# Patient Record
Sex: Female | Born: 1955 | Race: Black or African American | Hispanic: No | Smoking: Former smoker
Health system: Southern US, Community
[De-identification: ages and names within clinical notes are randomized; demographics above are authoritative.]

## PROBLEM LIST (undated history)

## (undated) DIAGNOSIS — Z950 Presence of cardiac pacemaker: Secondary | ICD-10-CM

## (undated) DIAGNOSIS — K219 Gastro-esophageal reflux disease without esophagitis: Secondary | ICD-10-CM

## (undated) DIAGNOSIS — K760 Fatty (change of) liver, not elsewhere classified: Secondary | ICD-10-CM

## (undated) DIAGNOSIS — M719 Bursopathy, unspecified: Secondary | ICD-10-CM

## (undated) DIAGNOSIS — R001 Bradycardia, unspecified: Secondary | ICD-10-CM

## (undated) DIAGNOSIS — T8859XA Other complications of anesthesia, initial encounter: Secondary | ICD-10-CM

## (undated) DIAGNOSIS — E119 Type 2 diabetes mellitus without complications: Secondary | ICD-10-CM

## (undated) DIAGNOSIS — K602 Anal fissure, unspecified: Secondary | ICD-10-CM

## (undated) DIAGNOSIS — M797 Fibromyalgia: Secondary | ICD-10-CM

## (undated) DIAGNOSIS — T7840XA Allergy, unspecified, initial encounter: Secondary | ICD-10-CM

## (undated) DIAGNOSIS — J45909 Unspecified asthma, uncomplicated: Secondary | ICD-10-CM

## (undated) DIAGNOSIS — K589 Irritable bowel syndrome without diarrhea: Secondary | ICD-10-CM

## (undated) DIAGNOSIS — E785 Hyperlipidemia, unspecified: Secondary | ICD-10-CM

## (undated) DIAGNOSIS — F32A Depression, unspecified: Secondary | ICD-10-CM

## (undated) DIAGNOSIS — M1990 Unspecified osteoarthritis, unspecified site: Secondary | ICD-10-CM

## (undated) DIAGNOSIS — T4145XA Adverse effect of unspecified anesthetic, initial encounter: Secondary | ICD-10-CM

## (undated) DIAGNOSIS — G5752 Tarsal tunnel syndrome, left lower limb: Secondary | ICD-10-CM

## (undated) DIAGNOSIS — L409 Psoriasis, unspecified: Secondary | ICD-10-CM

## (undated) DIAGNOSIS — I10 Essential (primary) hypertension: Secondary | ICD-10-CM

## (undated) DIAGNOSIS — B159 Hepatitis A without hepatic coma: Secondary | ICD-10-CM

## (undated) DIAGNOSIS — F329 Major depressive disorder, single episode, unspecified: Secondary | ICD-10-CM

## (undated) DIAGNOSIS — F419 Anxiety disorder, unspecified: Secondary | ICD-10-CM

## (undated) DIAGNOSIS — K8020 Calculus of gallbladder without cholecystitis without obstruction: Secondary | ICD-10-CM

## (undated) HISTORY — DX: Type 2 diabetes mellitus without complications: E11.9

## (undated) HISTORY — DX: Calculus of gallbladder without cholecystitis without obstruction: K80.20

## (undated) HISTORY — PX: SMALL INTESTINE SURGERY: SHX150

## (undated) HISTORY — PX: APPENDECTOMY: SHX54

## (undated) HISTORY — DX: Allergy, unspecified, initial encounter: T78.40XA

---

## 1977-06-06 DIAGNOSIS — B159 Hepatitis A without hepatic coma: Secondary | ICD-10-CM

## 1977-06-06 HISTORY — DX: Hepatitis a without hepatic coma: B15.9

## 1994-09-05 HISTORY — PX: ABDOMINAL HYSTERECTOMY: SHX81

## 1997-06-06 HISTORY — PX: SHOULDER ARTHROSCOPY W/ ROTATOR CUFF REPAIR: SHX2400

## 1998-02-27 ENCOUNTER — Ambulatory Visit (HOSPITAL_BASED_OUTPATIENT_CLINIC_OR_DEPARTMENT_OTHER): Admission: RE | Admit: 1998-02-27 | Discharge: 1998-02-27 | Payer: Self-pay | Admitting: Orthopedic Surgery

## 1998-10-08 ENCOUNTER — Other Ambulatory Visit: Admission: RE | Admit: 1998-10-08 | Discharge: 1998-10-08 | Payer: Self-pay | Admitting: *Deleted

## 1999-07-01 ENCOUNTER — Encounter: Payer: Self-pay | Admitting: Orthopedic Surgery

## 1999-07-01 ENCOUNTER — Ambulatory Visit (HOSPITAL_COMMUNITY): Admission: RE | Admit: 1999-07-01 | Discharge: 1999-07-01 | Payer: Self-pay | Admitting: Orthopedic Surgery

## 1999-11-15 ENCOUNTER — Other Ambulatory Visit: Admission: RE | Admit: 1999-11-15 | Discharge: 1999-11-15 | Payer: Self-pay | Admitting: Obstetrics & Gynecology

## 2001-01-12 ENCOUNTER — Other Ambulatory Visit: Admission: RE | Admit: 2001-01-12 | Discharge: 2001-01-12 | Payer: Self-pay | Admitting: Obstetrics & Gynecology

## 2001-08-07 ENCOUNTER — Encounter: Payer: Self-pay | Admitting: Emergency Medicine

## 2001-08-07 ENCOUNTER — Emergency Department (HOSPITAL_COMMUNITY): Admission: EM | Admit: 2001-08-07 | Discharge: 2001-08-07 | Payer: Self-pay | Admitting: Emergency Medicine

## 2002-05-06 ENCOUNTER — Other Ambulatory Visit: Admission: RE | Admit: 2002-05-06 | Discharge: 2002-05-06 | Payer: Self-pay | Admitting: Obstetrics & Gynecology

## 2003-01-28 ENCOUNTER — Encounter: Admission: RE | Admit: 2003-01-28 | Discharge: 2003-01-28 | Payer: Self-pay | Admitting: Gastroenterology

## 2003-01-28 ENCOUNTER — Encounter: Payer: Self-pay | Admitting: Gastroenterology

## 2003-02-03 ENCOUNTER — Inpatient Hospital Stay (HOSPITAL_COMMUNITY): Admission: AD | Admit: 2003-02-03 | Discharge: 2003-02-06 | Payer: Self-pay | Admitting: Internal Medicine

## 2003-02-04 ENCOUNTER — Encounter (INDEPENDENT_AMBULATORY_CARE_PROVIDER_SITE_OTHER): Payer: Self-pay | Admitting: Specialist

## 2003-02-04 ENCOUNTER — Encounter: Payer: Self-pay | Admitting: Internal Medicine

## 2003-02-05 ENCOUNTER — Encounter: Payer: Self-pay | Admitting: Internal Medicine

## 2003-05-13 ENCOUNTER — Other Ambulatory Visit: Admission: RE | Admit: 2003-05-13 | Discharge: 2003-05-13 | Payer: Self-pay | Admitting: Obstetrics & Gynecology

## 2003-09-24 ENCOUNTER — Inpatient Hospital Stay (HOSPITAL_COMMUNITY): Admission: AD | Admit: 2003-09-24 | Discharge: 2003-09-26 | Payer: Self-pay | Admitting: Gastroenterology

## 2004-01-22 ENCOUNTER — Emergency Department (HOSPITAL_COMMUNITY): Admission: EM | Admit: 2004-01-22 | Discharge: 2004-01-22 | Payer: Self-pay | Admitting: *Deleted

## 2004-01-26 ENCOUNTER — Emergency Department (HOSPITAL_COMMUNITY): Admission: EM | Admit: 2004-01-26 | Discharge: 2004-01-26 | Payer: Self-pay | Admitting: Emergency Medicine

## 2004-12-22 ENCOUNTER — Ambulatory Visit: Payer: Self-pay | Admitting: Gastroenterology

## 2004-12-24 ENCOUNTER — Ambulatory Visit: Payer: Self-pay | Admitting: Gastroenterology

## 2004-12-24 ENCOUNTER — Ambulatory Visit (HOSPITAL_COMMUNITY): Admission: RE | Admit: 2004-12-24 | Discharge: 2004-12-24 | Payer: Self-pay | Admitting: Gastroenterology

## 2005-04-11 ENCOUNTER — Ambulatory Visit: Payer: Self-pay | Admitting: Gastroenterology

## 2005-04-22 ENCOUNTER — Ambulatory Visit: Payer: Self-pay | Admitting: Gastroenterology

## 2005-05-12 ENCOUNTER — Ambulatory Visit: Payer: Self-pay | Admitting: Gastroenterology

## 2005-05-12 ENCOUNTER — Encounter (INDEPENDENT_AMBULATORY_CARE_PROVIDER_SITE_OTHER): Payer: Self-pay | Admitting: *Deleted

## 2005-05-12 DIAGNOSIS — K644 Residual hemorrhoidal skin tags: Secondary | ICD-10-CM | POA: Insufficient documentation

## 2006-10-31 ENCOUNTER — Ambulatory Visit: Payer: Self-pay | Admitting: Gastroenterology

## 2006-11-16 ENCOUNTER — Encounter (INDEPENDENT_AMBULATORY_CARE_PROVIDER_SITE_OTHER): Payer: Self-pay | Admitting: Gastroenterology

## 2006-11-16 ENCOUNTER — Ambulatory Visit: Payer: Self-pay | Admitting: Gastroenterology

## 2006-11-16 DIAGNOSIS — K5730 Diverticulosis of large intestine without perforation or abscess without bleeding: Secondary | ICD-10-CM | POA: Insufficient documentation

## 2007-03-29 ENCOUNTER — Ambulatory Visit: Payer: Self-pay | Admitting: Gastroenterology

## 2007-03-29 LAB — CONVERTED CEMR LAB
Basophils Absolute: 0 10*3/uL (ref 0.0–0.1)
Basophils Relative: 0.3 % (ref 0.0–1.0)
Eosinophils Absolute: 0.1 10*3/uL (ref 0.0–0.6)
Eosinophils Relative: 1.4 % (ref 0.0–5.0)
HCT: 36.9 % (ref 36.0–46.0)
Lymphocytes Relative: 28.8 % (ref 12.0–46.0)
MCV: 83.4 fL (ref 78.0–100.0)
RBC: 4.42 M/uL (ref 3.87–5.11)
RDW: 12.8 % (ref 11.5–14.6)
Sed Rate: 26 mm/hr — ABNORMAL HIGH (ref 0–25)
WBC: 7.2 10*3/uL (ref 4.5–10.5)

## 2007-08-02 DIAGNOSIS — F331 Major depressive disorder, recurrent, moderate: Secondary | ICD-10-CM | POA: Insufficient documentation

## 2007-08-02 DIAGNOSIS — M810 Age-related osteoporosis without current pathological fracture: Secondary | ICD-10-CM | POA: Insufficient documentation

## 2007-08-02 DIAGNOSIS — F32A Depression, unspecified: Secondary | ICD-10-CM | POA: Insufficient documentation

## 2007-08-02 DIAGNOSIS — F329 Major depressive disorder, single episode, unspecified: Secondary | ICD-10-CM | POA: Insufficient documentation

## 2007-08-02 DIAGNOSIS — F419 Anxiety disorder, unspecified: Secondary | ICD-10-CM

## 2007-08-02 DIAGNOSIS — F3340 Major depressive disorder, recurrent, in remission, unspecified: Secondary | ICD-10-CM | POA: Insufficient documentation

## 2007-10-01 ENCOUNTER — Ambulatory Visit: Payer: Self-pay | Admitting: Professional

## 2007-10-06 ENCOUNTER — Observation Stay (HOSPITAL_COMMUNITY): Admission: EM | Admit: 2007-10-06 | Discharge: 2007-10-09 | Payer: Self-pay | Admitting: Emergency Medicine

## 2007-10-08 HISTORY — PX: CARDIAC CATHETERIZATION: SHX172

## 2007-10-15 ENCOUNTER — Ambulatory Visit: Payer: Self-pay | Admitting: Professional

## 2007-11-01 ENCOUNTER — Ambulatory Visit: Payer: Self-pay | Admitting: Professional

## 2007-11-15 ENCOUNTER — Ambulatory Visit: Payer: Self-pay | Admitting: Professional

## 2007-11-20 ENCOUNTER — Ambulatory Visit: Payer: Self-pay | Admitting: Gastroenterology

## 2007-11-20 DIAGNOSIS — R072 Precordial pain: Secondary | ICD-10-CM | POA: Insufficient documentation

## 2007-11-20 DIAGNOSIS — E739 Lactose intolerance, unspecified: Secondary | ICD-10-CM | POA: Insufficient documentation

## 2007-11-20 DIAGNOSIS — K5732 Diverticulitis of large intestine without perforation or abscess without bleeding: Secondary | ICD-10-CM | POA: Insufficient documentation

## 2007-11-20 LAB — CONVERTED CEMR LAB
AST: 37 units/L (ref 0–37)
Bilirubin, Direct: 0.1 mg/dL (ref 0.0–0.3)
Eosinophils Relative: 1.9 % (ref 0.0–5.0)
MCV: 83.6 fL (ref 78.0–100.0)
Monocytes Absolute: 0.6 10*3/uL (ref 0.1–1.0)
Monocytes Relative: 9.3 % (ref 3.0–12.0)
Neutro Abs: 3.3 10*3/uL (ref 1.4–7.7)
RDW: 12.6 % (ref 11.5–14.6)
Total CK: 139 units/L (ref 7–177)
WBC: 6.6 10*3/uL (ref 4.5–10.5)

## 2007-11-29 ENCOUNTER — Ambulatory Visit: Payer: Self-pay | Admitting: Professional

## 2007-12-12 ENCOUNTER — Encounter: Payer: Self-pay | Admitting: Gastroenterology

## 2007-12-12 ENCOUNTER — Ambulatory Visit: Payer: Self-pay | Admitting: Gastroenterology

## 2007-12-13 ENCOUNTER — Ambulatory Visit: Payer: Self-pay | Admitting: Professional

## 2007-12-14 ENCOUNTER — Ambulatory Visit (HOSPITAL_COMMUNITY): Admission: RE | Admit: 2007-12-14 | Discharge: 2007-12-14 | Payer: Self-pay | Admitting: Gastroenterology

## 2007-12-14 ENCOUNTER — Encounter: Payer: Self-pay | Admitting: Gastroenterology

## 2007-12-24 ENCOUNTER — Ambulatory Visit: Payer: Self-pay | Admitting: Professional

## 2008-01-04 ENCOUNTER — Telehealth: Payer: Self-pay | Admitting: Gastroenterology

## 2008-01-10 ENCOUNTER — Ambulatory Visit: Payer: Self-pay | Admitting: Professional

## 2008-01-24 ENCOUNTER — Ambulatory Visit: Payer: Self-pay | Admitting: Professional

## 2008-02-07 ENCOUNTER — Ambulatory Visit: Payer: Self-pay | Admitting: Professional

## 2008-02-28 ENCOUNTER — Ambulatory Visit: Payer: Self-pay | Admitting: Professional

## 2008-03-13 ENCOUNTER — Ambulatory Visit: Payer: Self-pay | Admitting: Professional

## 2008-03-23 ENCOUNTER — Emergency Department (HOSPITAL_COMMUNITY): Admission: EM | Admit: 2008-03-23 | Discharge: 2008-03-23 | Payer: Self-pay | Admitting: Emergency Medicine

## 2008-03-27 ENCOUNTER — Telehealth: Payer: Self-pay | Admitting: Gastroenterology

## 2008-03-27 ENCOUNTER — Ambulatory Visit: Payer: Self-pay | Admitting: Professional

## 2008-04-07 ENCOUNTER — Ambulatory Visit: Payer: Self-pay | Admitting: Professional

## 2008-04-21 ENCOUNTER — Ambulatory Visit: Payer: Self-pay | Admitting: Professional

## 2008-05-08 ENCOUNTER — Ambulatory Visit: Payer: Self-pay | Admitting: Professional

## 2008-06-12 ENCOUNTER — Ambulatory Visit: Payer: Self-pay | Admitting: Professional

## 2008-06-26 ENCOUNTER — Ambulatory Visit: Payer: Self-pay | Admitting: Professional

## 2008-07-14 ENCOUNTER — Ambulatory Visit: Payer: Self-pay | Admitting: Professional

## 2008-08-18 ENCOUNTER — Ambulatory Visit: Payer: Self-pay | Admitting: Professional

## 2008-09-04 ENCOUNTER — Ambulatory Visit: Payer: Self-pay | Admitting: Professional

## 2008-09-14 ENCOUNTER — Inpatient Hospital Stay (HOSPITAL_COMMUNITY): Admission: EM | Admit: 2008-09-14 | Discharge: 2008-09-20 | Payer: Self-pay | Admitting: Emergency Medicine

## 2008-09-15 ENCOUNTER — Ambulatory Visit: Payer: Self-pay | Admitting: Internal Medicine

## 2008-09-30 ENCOUNTER — Ambulatory Visit: Payer: Self-pay | Admitting: Gastroenterology

## 2008-10-01 ENCOUNTER — Telehealth: Payer: Self-pay | Admitting: Gastroenterology

## 2008-10-02 ENCOUNTER — Telehealth: Payer: Self-pay | Admitting: Gastroenterology

## 2008-10-11 ENCOUNTER — Emergency Department (HOSPITAL_COMMUNITY): Admission: EM | Admit: 2008-10-11 | Discharge: 2008-10-12 | Payer: Self-pay | Admitting: Emergency Medicine

## 2008-10-21 ENCOUNTER — Ambulatory Visit: Payer: Self-pay | Admitting: Gastroenterology

## 2008-10-21 DIAGNOSIS — M797 Fibromyalgia: Secondary | ICD-10-CM | POA: Insufficient documentation

## 2008-10-21 DIAGNOSIS — K5900 Constipation, unspecified: Secondary | ICD-10-CM | POA: Insufficient documentation

## 2008-10-30 ENCOUNTER — Ambulatory Visit: Payer: Self-pay | Admitting: Professional

## 2008-10-31 ENCOUNTER — Telehealth: Payer: Self-pay | Admitting: Gastroenterology

## 2008-11-13 ENCOUNTER — Ambulatory Visit: Payer: Self-pay | Admitting: Professional

## 2008-11-27 ENCOUNTER — Ambulatory Visit: Payer: Self-pay | Admitting: Professional

## 2008-12-04 ENCOUNTER — Telehealth: Payer: Self-pay | Admitting: Gastroenterology

## 2008-12-04 ENCOUNTER — Ambulatory Visit: Payer: Self-pay | Admitting: Internal Medicine

## 2008-12-04 DIAGNOSIS — Z860100 Personal history of colon polyps, unspecified: Secondary | ICD-10-CM | POA: Insufficient documentation

## 2008-12-04 DIAGNOSIS — K219 Gastro-esophageal reflux disease without esophagitis: Secondary | ICD-10-CM | POA: Insufficient documentation

## 2008-12-10 ENCOUNTER — Telehealth: Payer: Self-pay | Admitting: Physician Assistant

## 2008-12-11 ENCOUNTER — Ambulatory Visit: Payer: Self-pay | Admitting: Professional

## 2008-12-12 ENCOUNTER — Telehealth: Payer: Self-pay | Admitting: Physician Assistant

## 2008-12-25 ENCOUNTER — Ambulatory Visit: Payer: Self-pay | Admitting: Professional

## 2009-01-29 ENCOUNTER — Ambulatory Visit: Payer: Self-pay | Admitting: Professional

## 2009-03-05 ENCOUNTER — Ambulatory Visit: Payer: Self-pay | Admitting: Professional

## 2009-03-09 ENCOUNTER — Telehealth: Payer: Self-pay | Admitting: Physician Assistant

## 2009-03-10 ENCOUNTER — Encounter: Payer: Self-pay | Admitting: Physician Assistant

## 2009-03-10 ENCOUNTER — Telehealth: Payer: Self-pay | Admitting: Physician Assistant

## 2009-03-11 ENCOUNTER — Ambulatory Visit: Payer: Self-pay | Admitting: Gastroenterology

## 2009-03-11 ENCOUNTER — Encounter: Payer: Self-pay | Admitting: Physician Assistant

## 2009-03-12 ENCOUNTER — Ambulatory Visit: Payer: Self-pay | Admitting: Professional

## 2009-03-13 LAB — CONVERTED CEMR LAB
Eosinophils Relative: 1.5 % (ref 0.0–5.0)
HCT: 39.6 % (ref 36.0–46.0)
Lymphs Abs: 3.9 10*3/uL (ref 0.7–4.0)
Monocytes Relative: 5.6 % (ref 3.0–12.0)
Neutro Abs: 4.4 10*3/uL (ref 1.4–7.7)
Neutrophils Relative %: 48 % (ref 43.0–77.0)
Platelets: 407 10*3/uL — ABNORMAL HIGH (ref 150.0–400.0)
RBC: 4.72 M/uL (ref 3.87–5.11)

## 2009-04-16 ENCOUNTER — Ambulatory Visit: Payer: Self-pay | Admitting: Professional

## 2009-05-04 ENCOUNTER — Ambulatory Visit: Payer: Self-pay | Admitting: Professional

## 2009-05-25 ENCOUNTER — Ambulatory Visit: Payer: Self-pay | Admitting: Professional

## 2009-06-11 ENCOUNTER — Telehealth: Payer: Self-pay | Admitting: Gastroenterology

## 2009-06-12 ENCOUNTER — Ambulatory Visit: Payer: Self-pay | Admitting: Gastroenterology

## 2009-06-12 LAB — CONVERTED CEMR LAB
Basophils Absolute: 0.1 10*3/uL (ref 0.0–0.1)
Basophils Relative: 0.9 % (ref 0.0–3.0)
Hemoglobin: 13.4 g/dL (ref 12.0–15.0)
Lymphocytes Relative: 40.7 % (ref 12.0–46.0)
Lymphs Abs: 2.6 10*3/uL (ref 0.7–4.0)
Monocytes Absolute: 0.6 10*3/uL (ref 0.1–1.0)
Monocytes Relative: 9.4 % (ref 3.0–12.0)
Neutro Abs: 2.9 10*3/uL (ref 1.4–7.7)
Neutrophils Relative %: 47.2 % (ref 43.0–77.0)
Platelets: 361 10*3/uL (ref 150.0–400.0)

## 2009-07-09 ENCOUNTER — Ambulatory Visit: Payer: Self-pay | Admitting: Professional

## 2009-07-24 ENCOUNTER — Encounter: Admission: RE | Admit: 2009-07-24 | Discharge: 2009-07-24 | Payer: Self-pay | Admitting: Surgery

## 2009-08-04 ENCOUNTER — Inpatient Hospital Stay (HOSPITAL_COMMUNITY): Admission: RE | Admit: 2009-08-04 | Discharge: 2009-08-08 | Payer: Self-pay | Admitting: Surgery

## 2009-08-04 ENCOUNTER — Encounter (INDEPENDENT_AMBULATORY_CARE_PROVIDER_SITE_OTHER): Payer: Self-pay | Admitting: Surgery

## 2009-08-04 HISTORY — PX: LAPAROSCOPIC LYSIS INTESTINAL ADHESIONS: SUR778

## 2009-08-04 HISTORY — PX: CYSTOSCOPY W/ URETERAL STENT PLACEMENT: SHX1429

## 2009-08-26 ENCOUNTER — Encounter: Payer: Self-pay | Admitting: Gastroenterology

## 2009-09-04 ENCOUNTER — Telehealth (INDEPENDENT_AMBULATORY_CARE_PROVIDER_SITE_OTHER): Payer: Self-pay | Admitting: *Deleted

## 2009-09-10 ENCOUNTER — Ambulatory Visit: Payer: Self-pay | Admitting: Professional

## 2009-09-16 ENCOUNTER — Encounter: Admission: RE | Admit: 2009-09-16 | Discharge: 2009-09-16 | Payer: Self-pay | Admitting: General Surgery

## 2009-09-21 ENCOUNTER — Telehealth (INDEPENDENT_AMBULATORY_CARE_PROVIDER_SITE_OTHER): Payer: Self-pay | Admitting: *Deleted

## 2009-10-12 ENCOUNTER — Ambulatory Visit: Payer: Self-pay | Admitting: Professional

## 2009-11-05 ENCOUNTER — Ambulatory Visit: Payer: Self-pay | Admitting: Professional

## 2009-11-26 ENCOUNTER — Ambulatory Visit: Payer: Self-pay | Admitting: Professional

## 2009-12-17 ENCOUNTER — Ambulatory Visit: Payer: Self-pay | Admitting: Professional

## 2010-01-07 ENCOUNTER — Ambulatory Visit: Payer: Self-pay | Admitting: Professional

## 2010-02-18 ENCOUNTER — Ambulatory Visit: Payer: Self-pay | Admitting: Professional

## 2010-04-01 ENCOUNTER — Ambulatory Visit: Payer: Self-pay | Admitting: Professional

## 2010-04-08 ENCOUNTER — Telehealth (INDEPENDENT_AMBULATORY_CARE_PROVIDER_SITE_OTHER): Payer: Self-pay | Admitting: *Deleted

## 2010-04-22 ENCOUNTER — Ambulatory Visit: Payer: Self-pay | Admitting: Professional

## 2010-05-20 ENCOUNTER — Ambulatory Visit: Payer: Self-pay | Admitting: Professional

## 2010-06-24 ENCOUNTER — Ambulatory Visit: Admit: 2010-06-24 | Payer: Self-pay | Admitting: Professional

## 2010-07-06 NOTE — Letter (Signed)
Summary: Out of Work  [facility]  [address] [place], [place] [address]   Phone: [phone]  Fax: [phone]    June 12, 2009   Employee:  [name]    To Whom It May Concern:   For Medical reasons, please excuse the above named employee from work for the following dates:  Start:   06/12/09  End:   06/19/09  If you need additional information, please feel free to contact our office.         Sincerely,    Alberteen Spindle RN

## 2010-07-06 NOTE — Progress Notes (Signed)
Summary: triage / diverticulitis  Phone Note Call from Patient Call back at [phone]   Caller: Patient Call For: Sharlett Iles Reason for Call: Talk to Nurse Summary of Call: Diverticulities flare up, wants to be seen today has a lot of abd pain. Initial call taken by: Ronalee Red,  June 11, 2009 9:13 AM  Follow-up for Phone Call        Pt started Sunday with abd pain.  Gradually has gotten worse.  Today has to walk bent over..  Had chills last pm.  Pt asking for OV or Rx for antiobiotics and pain med.  (Symptoms same as in OCT when pt saw [name].)  No diarrhea or bleeding noted. Follow-up by: Donna Surface RN,  June 11, 2009 12:03 PM  Additional Follow-up for Phone Call Additional follow up Details #1::        Offered pt OV tomorrow am with Dr. Sarath Privott.  Pt asking if she can go ahead and be started on antioboitics and pain med today.  She has been treated with these before and has done well.  Will come  for OV but feels needs to start on treatment today.  Leaving work because feels so bad.   Additional Follow-up by: Donna Surface RN,  June 11, 2009 1:09 PM    Additional Follow-up for Phone Call Additional follow up Details #2::    yes. Start on ciprofloxacin 500 mg p.o. b.i.d. and metronidazole 500 mg p.o. b.i.d. Dispense a 10 day supply of each. Place her on clear liquid diet. Tylenol for pain. See Dr. Morad Tal in the morning as planned. Follow-up by: John N Perry MD,  June 11, 2009 1:16 PM  Additional Follow-up for Phone Call Additional follow up Details #3:: Details for Additional Follow-up Action Taken: Pt notified.  Appt scheduled. Additional Follow-up by: Donna Surface RN,  June 11, 2009 1:28 PM  Prescriptions: CIPRO 500 MG TABS (CIPROFLOXACIN HCL) Tale 1 tab twice daily x 10 days  #20 x 0   Entered by:   Donna Surface RN   Authorized by:   John N Perry MD   Signed by:   Donna Surface RN on 06/11/2009   Method used:   Electronically to   [id number]       [address]       [place]       [place], [place]  [address]       Ph: [phone] or [phone]       Fax: [phone]   RxID:   [id number] FLAGYL 500 MG TABS (METRONIDAZOLE) Take 1 tab two times a day x 10 days  #20 x 0   Entered by:   Donna Surface RN   Authorized by:   John N Perry MD   Signed by:   Donna Surface RN on 06/11/2009   Method used:   [facility]ically to        [id number]       [address]       [place], [place]  [address]       Ph: [phone] or [phone]       Fax: [phone]   RxID:   [id number]

## 2010-07-06 NOTE — Progress Notes (Signed)
Summary: CHANGE GI CARE  ---- Converted from flag ---- ---- 04/08/2010 3:22 PM, Valda Favia wrote: This patient is transferring her records to Dr. Lorane Gell office. ------------------------------

## 2010-07-06 NOTE — Progress Notes (Signed)
Summary: Records request from [place] for records received from [facility]. Request forwarded to [facility]. Dena Chavis  September 04, 2009 12:39 PM  Appended Document: Records request from [place] for records received from [facility]. Request forwarded to [facility].

## 2010-07-06 NOTE — Assessment & Plan Note (Signed)
Summary: Flare up diverticulitis/dfs   History of Present Illness Visit Type: Follow-up Visit Primary GI MD: Verl Blalock MD FACP [place] Primary Provider: Daphane Shepherd, PA-C Requesting Provider: na Chief Complaint: Diverticulitis flare up that started on Sunday. Pt states she has LLQ abd pan and soreness and a change in bowel habits. Pt denies fever or any other sx.  History of Present Illness:   This patient is a peptic American female who has chronic recurrent lower abdominal pain with documented diverticulitis on several episodes. She has had surgical referral and has refused sigmoid resection. She continues to come in every several months complaints of lower abdominal discomfort and seeks narcotics and prolonged excuses from work. She now relates she's had lower abdominal pain for 2-3 days without nausea and vomiting, fever, chills, or diarrhea. She has been started empirically on Cipro and Flagyl and continues to take Vicodin 5 days 500 mg for chronic pain syndrome. Also of note the patient is on Robinul Forte 2 mg a day, Skelaxin, kapidex 60 mg a day, amitriptyline 50 mg at bedtime, and p.r.n. hydrocortisone suppositories.   GI Review of Systems    Reports abdominal pain and  nausea.     Location of  Abdominal pain: LLQ.    Denies acid reflux, belching, bloating, chest pain, dysphagia with liquids, dysphagia with solids, heartburn, loss of appetite, vomiting, vomiting blood, weight loss, and  weight gain.      Reports change in bowel habits and  diverticulosis.     Denies anal fissure, black tarry stools, constipation, diarrhea, fecal incontinence, heme positive stool, hemorrhoids, irritable bowel syndrome, jaundice, light color stool, liver problems, rectal bleeding, and  rectal pain.    Current Medications (verified): 1)  Vitamin D (Ergocalciferol) 50000 Unit Caps (Ergocalciferol) .... Take One By Mouth Every Other Week 2)  Kapidex 60 Mg Cpdr (Dexlansoprazole) .... Take 1 Tablet By  Mouth Once A Day 3)  Tricor 145 Mg Tabs (Fenofibrate) .... Take 1 Tablet By Mouth Once A Day 4)  Hydrocortisone Acetate 25 Mg Supp (Hydrocortisone Acetate) .... Insert One Into Rectum As Needed 5)  Fosamax 70 Mg Tabs (Alendronate Sodium) .... Take One Tablet Every Other Week 6)  Robinul-Forte 2 Mg Tabs (Glycopyrrolate) .... Take 1 Tablet By Mouth Once Daily 7)  Skelaxin 800 Mg Tabs (Metaxalone) .... 1/2 - 1 Tablet By Mouth Every 8 Hours As Needed 8)  Vicodin 5-500 Mg Tabs (Hydrocodone-Acetaminophen) .... Take One Tablet By Mouth Every 6 Hours 9)  Ibuprofen 800 Mg Tabs (Ibuprofen) .... Take One By Mouth As Needed 10)  Amitriptyline Hcl 25 Mg Tabs (Amitriptyline Hcl) .... Take 1-2 Tabs By Mouth At Bedtime 11)  Cipro 500 Mg Tabs (Ciprofloxacin Hcl) .... Tale 1 Tab Twice Daily X 10 Days 12)  Flagyl 500 Mg Tabs (Metronidazole) .... Take 1 Tab Two Times A Day X 10 Days 13)  Vicodin 5-500 Mg Tabs (Hydrocodone-Acetaminophen) .... Take 1 Tab Every 4-6 Hours As Needed For Pain 14)  [facility] Soluble Fiber Therapy  Powd (Methylcellulose (Laxative)) .... One Scoop in 17 Oz of Water Once Daily  Allergies (verified): 1)  ! Sulfa 2)  ! Darvocet 3)  ! Codeine 4)  ! * Latex  Past History:  Past medical, surgical, family and social histories (including risk factors) reviewed for relevance to current acute and chronic problems.  Past Medical History: Reviewed history from 12/04/2008 and no changes required. 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 Appendectomy Hysterectomy C-Section Rotator Cuff Repair-Left  Family History: Reviewed history from 09/30/2008 and no changes required. Family History of Diabetes: mother sisters and brother Family History of Liver Cancer:mother Family History of Colon Cancer:3 1st cousins died Family History of Heart Disease: 1/2 brother Family History of Breast Cancer: Grandmother, Aunt Family History of Pancreatic Cancer: Cousin  Social History: Reviewed history from 09/30/2008 and no changes required. Patient currently smokes. -4 cigarrettes per day Alcohol Use - no Daily Caffeine Use-1 cup Occupation: Education officer, museum Illicit Drug Use - no Patient does not get regular exercise.   Review of Systems       The patient complains of abdominal pain.  The patient denies anorexia, fever, weight loss, weight gain, vision loss, decreased hearing, hoarseness, chest pain, syncope, dyspnea on exertion, peripheral edema, prolonged cough, headaches, hemoptysis, melena, hematochezia, severe indigestion/heartburn, hematuria, incontinence, genital sores, muscle weakness, suspicious skin lesions, transient blindness, difficulty walking, depression, unusual weight change, abnormal bleeding, enlarged lymph nodes, angioedema, breast masses, and testicular masses.    Vital Signs:  Patient profile:   55 year old female Height:      67 inches Weight:      161.50 pounds BMI:     25.39 Temp:     98.2 degrees F oral Pulse rate:   88 / minute Pulse rhythm:   regular BP sitting:   118 / 72  (right arm) Cuff size:   regular  Vitals Entered By: Marlon Pel CMA Deborra Medina) (June 12, 2009 9:04 AM)  Physical Exam  General:  Well developed, well nourished, no acute distress.healthy appearing.   Head:  Normocephalic and atraumatic. Eyes:  PERRLA, no icterus. Lungs:  Clear throughout to auscultation. Heart:  Regular rate and rhythm; no murmurs, rubs,  or bruits. Abdomen:  Her abdomen is not distended and  there is minimal tenderness to deep palpation left lower quadrant without any masses. There is a negative psoas and obturator signs. Bowel sounds are normal Extremities:  No clubbing, cyanosis, edema or deformities noted. Neurologic:  Alert and  oriented x4;  grossly normal neurologically. Cervical Nodes:  No significant cervical adenopathy. Inguinal Nodes:  No significant inguinal adenopathy. Psych:  Alert and cooperative. Normal mood and affect.   Impression & Recommendations:  Problem # 1:  ABDOMINAL PAIN, LEFT LOWER QUADRANT (ICD-789.04) Assessment Unchanged Clinically she possibly has very mild subacute diverticulitis. It is obvious on speaking to the patient that she is seeking narcotics and prolonged absence from work. I have given her a one-week from work excuse pending repeat surgical evaluation. She was hospitalized in April of this past year had documented diverticulitis. She needs sigmoid resection before complications arise from her diverticulitis. Her management is obviously compromised by her environmental situation and chronic pain syndrome. She needs close followup with primary care and perhaps preoperative psychiatric evaluation.For Now, we will complete 10 days of Cipro, metronidazole, and twice a day Robinul Forte. I have not given her other prescriptions for narcotics. Screening lab test and ordered. Orders: TLB-CRP-High Sensitivity (C-Reactive Protein) (86140-FCRP) TLB-CBC Platelet - w/Differential (85025-CBCD) TLB-Sedimentation Rate (ESR) (85652-ESR)  Problem # 2:  GERD (ICD-530.81) Assessment: Improved continue PPI therapy as tolerated.  Problem # 3:  PERSONAL HX COLONIC POLYPS (ICD-V12.72) Assessment: Unchanged she is up-to-date on her colonoscopy exams.  Problem #  4:  FIBROMYALGIA (ICD-729.1) Assessment: Comment Only  Patient Instructions: 1)  Copy sent to : Dr. Harlan Stains [facility]  2)  Please continue current medications.  3)  Work  excuse for one week 4)  .No narcotic prescription given 5)  Surgical referral followup. 6)  Diet should be high in fiber ( fruits, vegetables, whole grains) but low in residue. Drink at least eight (8) glasses of water a day.   Appended Document: Flare up diverticulitis/dfs    Clinical Lists Changes  Orders: Added new Test order of [facility] ([facility]) - Signed

## 2010-07-06 NOTE — Letter (Signed)
Summary: [facility]   Imported By: Phillis Knack 09/23/2009 07:36:23  _____________________________________________________________________  External Attachment:    Type:   Image     Comment:   External Document

## 2010-07-06 NOTE — Progress Notes (Signed)
  Phone Note Other Incoming   Request: Send information Summary of Call: Received a completed [facility] medical release from the patient. She is requesting for her records to be sent to Dr. Lorane Gell office. faxed 34 pages to [phone].

## 2010-07-06 NOTE — Progress Notes (Signed)
Summary: Records request from [place] for records received from [facility]. Request forwarded to [facility]. Valda Favia  September 21, 2009 12:23 PM

## 2010-07-19 ENCOUNTER — Ambulatory Visit: Payer: Self-pay | Admitting: Professional

## 2010-07-19 ENCOUNTER — Ambulatory Visit (INDEPENDENT_AMBULATORY_CARE_PROVIDER_SITE_OTHER): Payer: 59 | Admitting: Professional

## 2010-07-19 DIAGNOSIS — F331 Major depressive disorder, recurrent, moderate: Secondary | ICD-10-CM

## 2010-08-02 ENCOUNTER — Ambulatory Visit (INDEPENDENT_AMBULATORY_CARE_PROVIDER_SITE_OTHER): Payer: 59 | Admitting: Professional

## 2010-08-02 DIAGNOSIS — F331 Major depressive disorder, recurrent, moderate: Secondary | ICD-10-CM

## 2010-08-23 ENCOUNTER — Ambulatory Visit (INDEPENDENT_AMBULATORY_CARE_PROVIDER_SITE_OTHER): Payer: 59 | Admitting: Professional

## 2010-08-23 DIAGNOSIS — F331 Major depressive disorder, recurrent, moderate: Secondary | ICD-10-CM

## 2010-08-27 LAB — COMPREHENSIVE METABOLIC PANEL
AST: 27 U/L (ref 0–37)
Albumin: 4.3 g/dL (ref 3.5–5.2)
BUN: 8 mg/dL (ref 6–23)
Calcium: 9.4 mg/dL (ref 8.4–10.5)
Glucose, Bld: 86 mg/dL (ref 70–99)
Potassium: 4.1 mEq/L (ref 3.5–5.1)
Sodium: 142 mEq/L (ref 135–145)
Total Protein: 7.7 g/dL (ref 6.0–8.3)

## 2010-08-27 LAB — CBC: Platelets: 313 10*3/uL (ref 150–400)

## 2010-08-30 LAB — COMPREHENSIVE METABOLIC PANEL
ALT: 37 U/L — ABNORMAL HIGH (ref 0–35)
Albumin: 3.6 g/dL (ref 3.5–5.2)
Alkaline Phosphatase: 41 U/L (ref 39–117)
BUN: 5 mg/dL — ABNORMAL LOW (ref 6–23)
Chloride: 107 mEq/L (ref 96–112)
Glucose, Bld: 141 mg/dL — ABNORMAL HIGH (ref 70–99)
Potassium: 3.5 mEq/L (ref 3.5–5.1)
Sodium: 143 mEq/L (ref 135–145)
Total Bilirubin: 0.4 mg/dL (ref 0.3–1.2)
Total Protein: 6.7 g/dL (ref 6.0–8.3)

## 2010-08-30 LAB — CBC
HCT: 34.6 % — ABNORMAL LOW (ref 36.0–46.0)
Hemoglobin: 11.3 g/dL — ABNORMAL LOW (ref 12.0–15.0)
Hemoglobin: 11.5 g/dL — ABNORMAL LOW (ref 12.0–15.0)
MCHC: 33.3 g/dL (ref 30.0–36.0)
Platelets: 290 10*3/uL (ref 150–400)
Platelets: 294 10*3/uL (ref 150–400)
RBC: 3.96 MIL/uL (ref 3.87–5.11)
RDW: 12.6 % (ref 11.5–15.5)
RDW: 12.6 % (ref 11.5–15.5)
WBC: 10 10*3/uL (ref 4.0–10.5)
WBC: 12.6 10*3/uL — ABNORMAL HIGH (ref 4.0–10.5)

## 2010-08-30 LAB — DIFFERENTIAL
Basophils Absolute: 0.1 10*3/uL (ref 0.0–0.1)
Basophils Relative: 0 % (ref 0–1)
Eosinophils Absolute: 0 10*3/uL (ref 0.0–0.7)
Monocytes Absolute: 0.7 10*3/uL (ref 0.1–1.0)
Monocytes Relative: 6 % (ref 3–12)
Neutro Abs: 7.3 10*3/uL (ref 1.7–7.7)
Neutrophils Relative %: 58 % (ref 43–77)

## 2010-08-30 LAB — GLUCOSE, CAPILLARY: Glucose-Capillary: 135 mg/dL — ABNORMAL HIGH (ref 70–99)

## 2010-09-14 LAB — URINALYSIS, ROUTINE W REFLEX MICROSCOPIC
Glucose, UA: NEGATIVE mg/dL
Ketones, ur: NEGATIVE mg/dL
Nitrite: NEGATIVE
Specific Gravity, Urine: 1.016 (ref 1.005–1.030)
pH: 5.5 (ref 5.0–8.0)

## 2010-09-14 LAB — COMPREHENSIVE METABOLIC PANEL
AST: 54 U/L — ABNORMAL HIGH (ref 0–37)
Albumin: 3.9 g/dL (ref 3.5–5.2)
Alkaline Phosphatase: 46 U/L (ref 39–117)
BUN: 7 mg/dL (ref 6–23)
Creatinine, Ser: 0.55 mg/dL (ref 0.4–1.2)
GFR calc Af Amer: >60 mL/min (ref >60)
Potassium: 3.7 mEq/L (ref 3.5–5.1)
Total Protein: 6.9 g/dL (ref 6.0–8.3)

## 2010-09-14 LAB — POCT I-STAT, CHEM 8
Calcium, Ion: 1.19 mmol/L (ref 1.12–1.32)
Chloride: 102 mEq/L (ref 96–112)
Glucose, Bld: 147 mg/dL — ABNORMAL HIGH (ref 70–99)
HCT: 39 % (ref 36.0–46.0)
TCO2: 31 mmol/L (ref 0–100)

## 2010-09-14 LAB — CBC
HCT: 37.6 % (ref 36.0–46.0)
Platelets: 294 10*3/uL (ref 150–400)
RDW: 13 % (ref 11.5–15.5)

## 2010-09-14 LAB — URINE MICROSCOPIC-ADD ON

## 2010-09-14 LAB — DIFFERENTIAL
Lymphocytes Relative: 23 % (ref 12–46)
Monocytes Absolute: 0.5 10*3/uL (ref 0.1–1.0)
Monocytes Relative: 5 % (ref 3–12)
Neutro Abs: 7.9 10*3/uL — ABNORMAL HIGH (ref 1.7–7.7)

## 2010-09-15 LAB — BASIC METABOLIC PANEL
BUN: 4 mg/dL — ABNORMAL LOW (ref 6–23)
CO2: 28 mEq/L (ref 19–32)
CO2: 27 mEq/L (ref 19–32)
CO2: 28 mEq/L (ref 19–32)
Calcium: 9.4 mg/dL (ref 8.4–10.5)
Chloride: 110 mEq/L (ref 96–112)
Chloride: 108 mEq/L (ref 96–112)
Chloride: 111 mEq/L (ref 96–112)
Creatinine, Ser: 0.67 mg/dL (ref 0.4–1.2)
Creatinine, Ser: 0.69 mg/dL (ref 0.4–1.2)
GFR calc Af Amer: >60 mL/min (ref >60)
GFR calc non Af Amer: >60 mL/min (ref >60)
Glucose, Bld: 92 mg/dL (ref 70–99)
Glucose, Bld: 100 mg/dL — ABNORMAL HIGH (ref 70–99)
Glucose, Bld: 95 mg/dL (ref 70–99)
Potassium: 4.1 mEq/L (ref 3.5–5.1)
Potassium: 4 mEq/L (ref 3.5–5.1)
Sodium: 143 mEq/L (ref 135–145)
Sodium: 141 mEq/L (ref 135–145)

## 2010-09-15 LAB — CBC
HCT: 34 % — ABNORMAL LOW (ref 36.0–46.0)
HCT: 33.6 % — ABNORMAL LOW (ref 36.0–46.0)
Hemoglobin: 11.5 g/dL — ABNORMAL LOW (ref 12.0–15.0)
Hemoglobin: 11.7 g/dL — ABNORMAL LOW (ref 12.0–15.0)
Hemoglobin: 11.3 g/dL — ABNORMAL LOW (ref 12.0–15.0)
Hemoglobin: 11.4 g/dL — ABNORMAL LOW (ref 12.0–15.0)
MCHC: 33.3 g/dL (ref 30.0–36.0)
MCHC: 33.7 g/dL (ref 30.0–36.0)
MCHC: 33.2 g/dL (ref 30.0–36.0)
MCHC: 33.6 g/dL (ref 30.0–36.0)
MCV: 83.5 fL (ref 78.0–100.0)
MCV: 83.5 fL (ref 78.0–100.0)
MCV: 84.2 fL (ref 78.0–100.0)
Platelets: 278 10*3/uL (ref 150–400)
Platelets: 284 10*3/uL (ref 150–400)
Platelets: 321 10*3/uL (ref 150–400)
RBC: 3.97 MIL/uL (ref 3.87–5.11)
RBC: 4.26 MIL/uL (ref 3.87–5.11)
RBC: 4.96 MIL/uL (ref 3.87–5.11)
RDW: 12.6 % (ref 11.5–15.5)
RDW: 12.7 % (ref 11.5–15.5)
RDW: 12.9 % (ref 11.5–15.5)
RDW: 12.9 % (ref 11.5–15.5)
RDW: 13.4 % (ref 11.5–15.5)
WBC: 5.5 10*3/uL (ref 4.0–10.5)
WBC: 6.1 10*3/uL (ref 4.0–10.5)
WBC: 6.6 10*3/uL (ref 4.0–10.5)

## 2010-09-15 LAB — COMPREHENSIVE METABOLIC PANEL
ALT: 52 U/L — ABNORMAL HIGH (ref 0–35)
AST: 30 U/L (ref 0–37)
Albumin: 3.5 g/dL (ref 3.5–5.2)
CO2: 26 mEq/L (ref 19–32)
Chloride: 108 mEq/L (ref 96–112)
GFR calc Af Amer: >60 mL/min (ref >60)
GFR calc non Af Amer: >60 mL/min (ref >60)
Potassium: 4.1 mEq/L (ref 3.5–5.1)
Sodium: 137 mEq/L (ref 135–145)
Total Bilirubin: 0.5 mg/dL (ref 0.3–1.2)

## 2010-09-15 LAB — URINALYSIS, ROUTINE W REFLEX MICROSCOPIC
Hgb urine dipstick: NEGATIVE
Protein, ur: NEGATIVE mg/dL
Urobilinogen, UA: 0.2 mg/dL (ref 0.0–1.0)

## 2010-09-15 LAB — DIFFERENTIAL
Basophils Absolute: 0 10*3/uL (ref 0.0–0.1)
Basophils Relative: 1 % (ref 0–1)
Monocytes Relative: 8 % (ref 3–12)
Neutro Abs: 3.4 10*3/uL (ref 1.7–7.7)
Neutrophils Relative %: 53 % (ref 43–77)

## 2010-09-15 LAB — PREGNANCY, URINE: Preg Test, Ur: NEGATIVE

## 2010-09-15 LAB — HEMOGLOBIN AND HEMATOCRIT, BLOOD: Hemoglobin: 11.1 g/dL — ABNORMAL LOW (ref 12.0–15.0)

## 2010-09-23 ENCOUNTER — Ambulatory Visit: Payer: 59 | Admitting: Professional

## 2010-10-14 ENCOUNTER — Ambulatory Visit (INDEPENDENT_AMBULATORY_CARE_PROVIDER_SITE_OTHER): Payer: 59 | Admitting: Professional

## 2010-10-14 DIAGNOSIS — F331 Major depressive disorder, recurrent, moderate: Secondary | ICD-10-CM

## 2010-10-19 ENCOUNTER — Other Ambulatory Visit: Payer: Self-pay | Admitting: Obstetrics & Gynecology

## 2010-10-19 NOTE — Consult Note (Signed)
[name], [name]                   ACCOUNT NO.:  [id number]   MEDICAL RECORD NO.:  [id number]          PATIENT TYPE:  INP   LOCATION:  5643                         FACILITY:  [facility]   PHYSICIAN:  Gatha Mayer, MD,FACGDATE OF BIRTH:  01-01-1956   DATE OF CONSULTATION:  09/15/2008  DATE OF DISCHARGE:                                 CONSULTATION   REQUESTING PHYSICIAN:  Vernell Leep, MD of [facility].   PRIMARY CARE PHYSICIAN:  [facility].   REASON FOR CONSULTATION:  Diverticulitis.   ASSESSMENT:  A 55 year old African American woman known to Dr. Verl Blalock and previously Dr. Lyla Son, followed for recurrent  diverticulitis and diverticulosis, history of colon polyps and family  history of colon cancer as well.   At this point she appears to have uncomplicated but recurrent  diverticulitis.  CT scan suggested an abnormality of the rectum.  She  had a colonoscopy in 2008 where there was no such finding and rectal  exam per the nurse practitioner does not reveal any problem, so that is  probably artifactual.  It may be some edema related to the sigmoid  diverticulitis.   RECOMMENDATIONS/PLAN:  1. Continue intravenous antibiotics.  2. Surgical consultation is appropriate.  Note, she had had an      increase in her pain that was recently improved by draining of her      bladder probably related to narcotic bladder issues.  However, she      has had recurrent diverticulitis over the years  so consideration      for elective segmental resection is appropriate and this is      discussed with the patient.  3. Last colonoscopy June 2008.  She said she has been told to have      these every 2 years.  There is a family history of colon cancer      reported.  She may end up needing a sigmoidoscopy exam versus a      colonoscopy though would do that electively.  The colon cancer is      in a cousin and not a first degree relative, so it is not  clear to      me she needs a colonoscopy now though we would have to see the full      details of that report prior to making a determination about the      next interval.  Dr. Sharlett Iles had assumed her regular      gastrointestinal care.  Would defer to him.   HISTORY:  55 African American woman with problems as  outlined above.  She called the on-call GI physician this weekend asking  for pain medication and antibiotics, was told to come to the ER but she  really did not want to do so.  Antibiotics were prescribed.  The pain  worsened so she presented to the emergency room.  Her last bowel  movement was normal on Saturday.  She has had subjective chills and  fever.  Very similar to the left lower quadrant  pain of previous  episodes of diverticulitis.  She said her hemorrhoids had flared up a  little bit lately as well.  She has had some increase in heartburn  recently.  Normally takes Kapidex daily for that as well as Robinul for  IBS problems.  She had had increase in pain after the hospitalization  here today, which again was relieved by insertion of a catheter to drain  her bladder.  She was having increasing pain medication requirements,  having switched from Dilaudid to morphine, but is definitely comfortable  at this time as I interview and examine her.   PAST MEDICAL HISTORY:  1. Gastroesophageal reflux disease.  2. Irritable bowel syndrome.  3. Diverticulosis.  4. Diverticulitis, multiple episodes.  She said this is the first      since 2006.  She may have had some in 2009 versus irritable bowel      syndrome.  5. Prior appendectomy.  6. Prior hysterectomy.  7. Anxiety.  8. Depression.  9. Chest pain admission.  10.Noncardiac chest pain in May of 2009 seen by Dr. Doylene Canard.  11.Cardiac catheterization, normal coronary arteries.   MEDICATIONS:  Fosamax, Lovenox, Tri-Cor, Mucinex, Protonix, Zosyn,  Ventolin, Dulcolax, Dilaudid and Zofran.   ALLERGIES:   SULFA, DARVOCET, CODEINE, LATEX.   FAMILY HISTORY:  Colon cancer in a first cousin.  Diabetes mellitus.  Liver cancer in her mother.   REVIEW OF SYSTEMS:  As per the HPI.  All other systems negative.   SOCIAL HISTORY:  She does not use alcohol.  She does smoke.   PHYSICAL EXAMINATION:  Reveals a well-developed, well-nourished middle-  aged black woman in no acute distress.  Temperature 98.3, pulse 64,  blood pressure 115/75, respirations 16.  EYES:  Anicteric.  Conjunctivae pink.  Mouth:  Free of lesions.  NECK:  Supple, no masses.  CHEST:  Some crackles at the right lower lobe.  Otherwise clear.  HEART:  S1, S2.  No murmurs or gallops.  ABDOMEN:  Soft, moderately tender in the left lower quadrant without  organomegaly or mass or rebound.  Bowel sounds are present.  RECTAL EXAM:  Per my nurse practitioner, no stool in the vault.  No  obvious masses.  No external hemorrhoids or fissures.  It was somewhat  tender and painful.  EXTREMITIES:  Free of edema.  No cervical adenopathy.  NEURO:  She is alert and oriented x3.   LABORATORY DATA:  Shows white count 6.6, hemoglobin 11.9, platelets 291.  Her HCG was negative.  ALT was 52.  LFTs, BMET otherwise normal.  CT  showed mild diffuse fatty liver, diverticular changes in the  rectosigmoid colon with focal inflammatory changes at the junction of  descending and sigmoid colon and a left perirectal lymph node 2.1 x 1.7  cm and asymmetric soft tissue changes in that area.   I appreciate the opportunity to care for this patient.      Gatha Mayer, MD,FACG  Electronically Signed     CEG/MEDQ  D:  09/15/2008  T:  09/15/2008  Job:  [id number]   cc:   Loralee Pacas. Sharlett Iles, MD, FACG, FACP, FAGA  [address]  [place] [address]   [facility]  [address]  [place], [place] [address]

## 2010-10-19 NOTE — Discharge Summary (Signed)
[name], [name]                   ACCOUNT NO.:  [id number]   MEDICAL RECORD NO.:  [id number]          PATIENT TYPE:  OBV   LOCATION:  5409                         FACILITY:  [facility]   PHYSICIAN:  Birdie Riddle, M.D.  DATE OF BIRTH:  1955-09-18   DATE OF ADMISSION:  10/06/2007  DATE OF DISCHARGE:  10/09/2007                               DISCHARGE SUMMARY   REFERRED BY:  Wendie Agreste, M.D.   FINAL DIAGNOSES:  1. Noncardiac chest pain.  2. Anxiety.  3. Gastroesophageal reflux disease.  4. Depression.  5. Tobacco use disorder.   DISCHARGE MEDICATIONS:  1. Aspirin 81 mg 1 daily.  2. Amitriptyline 25 mg at bedtime.  3. Xanax 0.25 mg 1 as needed.  4. Actonel 5 mg once a week.  5. Vitamin D 1000 units once a week.  6. Robinul daily 1 mg.  7. Tylenol as needed and directed.  8. Crestor 10 mg 1 daily.  9. Protonix 40 mg 1 daily.   Follow up by Dr. Dixie Dials in 1 month.  The patient to call [phone]  for appointment and Dr. Dellis Filbert of [facility] in  4-5 days.   DISCHARGE DIET:  Low-sodium heart-healthy diet.   DISCHARGE ACTIVITY:  The patient is to increase activity slowly, and the  patient to stop any activity that causes chest pain, shortness of  breath, dizziness, sweating, or excessive weakness.   WOUND CARE INSTRUCTIONS:  The patient to notify right groin pain,  swelling, or discharge, and the patient to get additional lipid.  Liver  function tests in 8 weeks and additional testing for noncardiac chest  pain by primary care physician.   HISTORY:  This 55 year old female presented with sharp chest pain  radiating to the neck and left arm along with left hand numbness.  The  patient had similar episode 6 months ago, but she did not go to  emergency room.  Today, her electrocardiogram appears suspicious for new  anterior wall injury versus old myocardial infarction.   PHYSICAL EXAMINATION:  VITAL SIGNS:  Temperature 96.6, pulse 56,  respirations 25, blood pressure 147/80, height 5 feet 7 inches, weight  152 pounds, and oxygen saturation 100%.  GENERAL:  The patient is well-built and well-nourished black female in  no significant distress, appears somewhat anxious.  HEENT:  The patient is normocephalic, atraumatic, has black eyes.  Pupils are equal and reacting to light.  Conjunctivae pink.  Sclerae  nonicteric.  NECK:  No JVD.  LUNGS:  Clear bilaterally.  HEART:  Normal S1 and S2 without S3 or gallop.  ABDOMEN:  Soft.  EXTREMITIES:  No edema, cyanosis, or clubbing.  SKIN:  Warm and dry.  NEUROLOGIC:  The patient moves all four extremities.  Cranial nerves  grossly intact.   LABORATORY DATA:  Normal electrolytes, BUN, creatinine, and glucose.  Normal hemoglobin/hematocrit, WBC count, and platelet count.  INR 0.9,  CK-MB, and troponin I negative x3.  Thyroid stimulating hormone was  normal.  Cholesterol level was normal.  Triglyceride was elevated at  384.  LDL cholesterol  was down to 68 and HDL cholesterol was slightly  low at 31.   Cardiac catheterization showed normal coronaries.   EKG showed a sinus rhythm with ST elevations in V2 lead.   HOSPITAL COURSE:  The patient was admitted to telemetry bed, myocardial  infarction was ruled out.  Because of her typical chest pain, she  underwent a diagnostic cardiac catheterization that failed to show any  coronary artery disease, and her left ventricular systolic function was  normal.  Hence, the patient's medications were adjusted, and she was  advised to see primary care physician for additional noncardiac chest  pain workup. She was discharged home in satisfactory condition on Oct 09, 2007.      Birdie Riddle, M.D.  Electronically Signed     ASK/MEDQ  D:  10/24/2007  T:  10/25/2007  Job:  [id number]   cc:   Wendie Agreste, M.D.

## 2010-10-19 NOTE — H&P (Signed)
[name], [name]                   ACCOUNT NO.:  [id number]   MEDICAL RECORD NO.:  [id number]          PATIENT TYPE:  INP   LOCATION:  0102                         FACILITY:  [facility]   PHYSICIAN:  Dena Billet, MD     DATE OF BIRTH:  10-Apr-1956   DATE OF ADMISSION:  09/14/2008  DATE OF DISCHARGE:                              HISTORY & PHYSICAL   PRIMARY CARE PHYSICIAN:  Dr. Philip Aspen.   CHIEF COMPLAINT:  Abdominal pain.   HISTORY OF PRESENT ILLNESS:  This is a 55 year old African American  female patient with a past medical history significant for  diverticulosis and gastritis as well as irritable bowel syndrome who was  apparently asymptomatic 5 days ago when she started having some lower  quadrant abdominal pain.  The pain was mild in nature until 2 days ago  when it had gotten worse when patient had called Dr. Velora Heckler GI when she  was prescribed Cipro and Flagyl.  The patient still had significant  pain, was not relieved by her Vicodin.  The patient was not able to eat  anything due to the pain and was taking some Jello at home.  Today, the  patient's abdominal pain had gotten worse, it was 10 out of 10 on a  scale of 1-10, when 10 is the worst pain, along with nausea though no  diarrhea or vomiting was noted.   REVIEW OF SYSTEMS:  As above.  Rest of the review of systems were  negative.   PAST MEDICAL HISTORY:  1. Diverticulosis.  2. Coronary artery disease status post MI last May, status post      cardiac cath.  3. GERD.  4. Irritable bowel syndrome.  5. Diverticulosis.  6. Anxiety.  7. Gastritis.  8. Osteoporosis.  9. Fibromyalgia.   PAST SURGICAL HISTORY:  1. Left shoulder rotator cuff repair.  2. Partial hysterectomy.  3. Cardiac cath May 2009, no stent was put in.   FAMILY HISTORY:  Positive for coronary artery disease and diabetes.   SOCIAL HISTORY:  The patient smokes 5 to 6 cigarettes per day for 33  years.  No alcohol, no IV drug abuse.   MEDICATIONS:  1. Kapidex 60 daily.  2. Robinul 2 mg daily.  3. Tricor 145 daily.  4. Vicodin p.r.n.  5. Vitamin D 1 tablet every other week.  6. Flagyl 250 p.o. 3 times daily.  7. Cipro 240 2 times daily.  8. Fosamax 70 weekly.   ALLERGIES:  1. CODEINE.  2. LATEX.  3. SULFA.   PHYSICAL EXAM:  VITALS:  Temperature is 97.4, blood pressure 140-  150/80s, pulse 50s to 70s, respirations 20s, pulse ox 98-100% on room  air.  Patient awake, alert, oriented x3.  Does not appear to be in acute  distress.  HEENT:  Pupils equal, round, react to light, no icterus, no pallor.  Extraocular movements are intact, oral mucosa is dry.  NECK:  Supple, no JVD, no lymphadenopathy, no thyromegaly.  CVS:  S1-S2 regular to sinus brady.  CHEST:  Clear.  ABDOMEN:  Soft.  There is  a tenderness on the superficial palpation of  the left lower quadrant.  No rebound.  Bowel sounds present, no  hepatosplenomegaly.  EXTREMITIES:  Peripheral pulses are present.  No clubbing, cyanosis or  edema.  CNS:  Sensory, motor grossly intact.  Cranial nerves II-XII are intact.  SKIN:  No rashes.  MUSCULOSKELETAL:  Unremarkable.   A CT scan of the abdomen and pelvis reveals sigmoid diverticulitis at  the junction of descending and sigmoid colon and asymmetric soft tissue  in the left side of the rectum, in the right a perirectal lymph node  concerning for neoplasm.  CBC was within normal range along with the  basic metabolic panel as well as the UA.   IMPRESSION:  1. Acute diverticulitis.  The patient failed outpatient oral treatment      management plan.  2. Abdominal pain.  3. Coronary artery disease status post cardiac catheterization.  The      patient is not taking her aspirin.  4. Gastroesophageal reflux disease.  5. Dehydration,  6. Rectal soft tissue swelling with lymphadenopathy, concern for      neoplasm.  7. Hyperlipidemia.  8. Irritable bowel syndrome.   PLAN:  1. Admit to Med-Surg.  2. Clear liquid diet for  now, advance as tolerated.  3. Continue IV fluids.  4. IV Zosyn 4.5 IV every 8.  5. Consult GI, has been notified.  Dr. Lajoyce Corners has been notified from the      ER.      Dena Billet, MD  Electronically Signed     NS/MEDQ  D:  09/14/2008  T:  09/14/2008  Job:  [id number]   cc:   Ermalene Searing. Philip Aspen, M.D.  Fax: [phone]

## 2010-10-19 NOTE — Assessment & Plan Note (Signed)
[facility] OFFICE NOTE   NAME:LEANetra, Postlethwait                        MRN:          [id number]  DATE:03/29/2007                            DOB:          [birth year]    Ms. Maiers is a middle-aged [place] American female who recently had left  lower quadrant pain.  Her triage call was taken by myself and we decided  to treat her for diverticulitis per her past medical history.  She  currently is asymptomatic, after completing a 1-week course of  metronidazole and ciprofloxacin.  This patient has been followed over  the many years by Dr. Velora Heckler and she was hospitalized in August 2004,  with rather severe diverticulitis.  Since that time she has had more or  less IBS type complaints and has had colon polyps removed with her last  colonoscopy exam in June 2008.  She currently is asymptomatic but does have some mild constipation and  does not take fiber supplements regularly.  She denies upper GI  hepatobiliary complaints.  She does use Robinul 2 mg every 12 hours  p.r.n.  She has mild osteoporosis and is on  vitamin D and Actonel.  She  also takes Xanax p.r.n. and diclofenac 75 mg twice a day.  She follows a  regular diet and denies any specific food intolerance.  Denies anorexia  or weight loss.   PHYSICAL EXAMINATION:  GENERAL:  She is a healthy-appearing, middle-  aged, black female in no distress.  VITAL SIGNS:  Her blood pressure is 112/80, pulse 84 and regular.  ABDOMEN:  I could not appreciate hepatosplenomegaly, abdominal masses or  significant tenderness at this time.  Bowel sounds were normal.   ASSESSMENT:  Ms. Heath has had subacute diverticulitis which seems to be  resolving at this time.   On review of her chart, she has not had frequent episodes and only 1  severe episode.  She is status post multiple surgical procedures  including appendectomy, hysterectomy, and cesarean section and Dr.  Velora Heckler  reports she has very long and tortuous and redundant colon.  I  have broached the subject of possible sigmoid resection depending on the  clinical course.  The patient seems very uninterested in this approach.   RECOMMENDATIONS:  1. Patient education regarding diverticulosis and its management.  2. High fiber diet with daily Benefiber and liberal p.o. fluids.  3. Finish antibiotic course.  4. Check CBC and sed rate.  5. GI followup p.r.n. as needed.     Loralee Pacas. Sharlett Iles, MD, [place] Ore, [place]  Electronically Signed    DRP/MedQ  DD: 03/29/2007  DT: 03/29/2007  Job #: [id number]

## 2010-10-19 NOTE — Discharge Summary (Signed)
[name], [name]                   ACCOUNT NO.:  [id number]   MEDICAL RECORD NO.:  [id number]          PATIENT TYPE:  INP   LOCATION:  3976                         FACILITY:  [facility]   PHYSICIAN:  Adele Barthel, MD    DATE OF BIRTH:  1956/05/04   DATE OF ADMISSION:  09/14/2008  DATE OF DISCHARGE:  09/20/2008                               DISCHARGE SUMMARY   GI SPECIALIST:  Dr. Leanna Battles from [facility].   PRIMARY CARE PHYSICIAN:  Dr. Sherald Barge at [facility].   ADMITTING HISTORY:  Please refer to the note dictated by Dr. Dena Billet for history of present illness.   DISCHARGE MEDICATIONS:  1. Ciprofloxacin 500 mg p.o. q.12 hours.  2. Flagyl 500 mg p.o. q.8 hours.  3. Percocet 5/325 mg p.o. q.6 hours p.r.n.  4. TriCor 145 mg p.o. daily.   PRIMARY DIAGNOSIS:  Acute diverticulitis.   SECONDARY DIAGNOSES:  1. Gastroesophageal reflux disease.  2. Coronary artery disease.  3. Irritable bowel syndrome.  4. Hyperlipidemia.   HOSPITAL COURSE:  The following issues were addressed during the  hospitalization:  1. Diverticulitis.  The patient failed outpatient oral antibiotic      regimen.  She underwent a CT scan at the time of admission which      showed sigmoid diverticulitis at junction of descending and sigmoid      colon and asymmetric soft tissue left side of the rectum and right      perirectal lymph node concerning neoplasm.  The patient received IV      Zosyn.  She had a very slow resolution of her symptoms.  So repeat      CT scan was performed on September 18, 2008, to make sure she was not      developing any intra-abdominal complications from the      diverticulitis.  However, it was consistent with mild      diverticulitis at the same site without any significant change      without any evidence of abscess or other complication.  GI consult      was obtained from [facility] on September 15, 2008, by Dr. Carlean Purl who      recommended IV  antibiotics.  At the time of discharge the patient      is significantly better, does have a bowel movement, is needing      minimal p.o. pain medication and has started tolerating regular      feeds.  She is going to follow up with Dr. Philip Aspen in his office      on April 27 at 1:45 p.m. for further followup and she may need a      flexible sigmoidoscopy as an outpatient.  2. History of hyperlipidemia.  Zocor was continued.  3. History of gastroesophageal reflux disease.  Proton pump inhibitor      was continued.   PROCEDURE PERFORMED:  None.   IMAGING PERFORMED:  As mentioned in the hospital consultation performed  as mentioned under hospital course.   DISPOSITION:  1. The patient will  follow up with Dr. Philip Aspen in his office on September 30, 2008.  2. The patient will follow up with primary care physician, Dr.      Gorden Harms at [facility].   Total time spent in discharge of this patient 1 hour.      Adele Barthel, MD  Electronically Signed     NP/MEDQ  D:  09/20/2008  T:  09/20/2008  Job:  [id number]   cc:   Ermalene Searing. Philip Aspen, M.D.  Fax: [phone]   Dr Francoise Schaumann

## 2010-10-19 NOTE — Consult Note (Signed)
[name], [name]                   ACCOUNT NO.:  [id number]   MEDICAL RECORD NO.:  [id number]          PATIENT TYPE:  INP   LOCATION:  [place]                         FACILITY:  [facility]   PHYSICIAN:  [place] A. Walker Kehr, M.D.    DATE OF BIRTH:  12-Aug-1955   DATE OF CONSULTATION:  10/06/2007  DATE OF DISCHARGE:                                 CONSULTATION   PRIMARY CARE Janet Decesare:  Dr. Nyoka Cowden.   CHIEF COMPLAINT:  Chest pain.   HISTORY OF PRESENT ILLNESS:  The patient is a 55 year old African  American female who awoke at sleep this morning at 4:30 with sudden  sharp chest pain that radiated to her neck and left arm that was located  centrally.  The patient felt short of breath with the chest pain, felt  nauseated, have left hand numbness.  The patient took 325 mg aspirin,  which relieved the pain partially but then called 911.  The patient  received nitroglycerin in the ambulance.  Once in the ED, she received  morphine which relieved the chest pain.  The patient does have a history  of cough, anxiety and stress, history of pleurisy in the past.  The  patient also has a history of 6 months of having chest pain that was  very severe, but never was seen the ED physicians.  The patient  currently has known hypercholesterolemia and is a smoker of half pack  per day for 33 years.  The patient currently, at the time of the  consult, is complaining of chest pain, once again reoccurring that was  relieved with morphine and noted to be sharp and centrally located  radiating to her neck.   PAST MEDICAL HISTORY:  1. Diverticulosis.  2. Depression.  3. Irritable bowel syndrome.  4. TMJ.  5. Hypercholesterolemia, no medications.  6. Anxiety.   REVIEW OF SYSTEMS:  In general, she denies any fevers or appetite  changes or weight changes.  She does state she has had chills, sweats,  and nausea.  HEENT:  No headache, no sore throat, no ear pain, no  rhinorrhea.  On cardiovascular review of systems, she  does have chest  pain.  She has had palpitations this morning, but no orthopnea or PND.  On respiratory review of systems, she has had cough and dyspnea, but no  wheezing, sputum, or hemoptysis.  On GI, she has had diarrhea yesterday  severe with abdominal pain and gas.  However, she denies any vomiting,  dysphagia, hematemesis, a bright red blood per rectum or melena.  On GU,  she denies any dysuria or hematuria or nocturia.  On skin, she has no  new rashes or bruises.  On musculoskeletal review, she has no  deformities, arthralgias, or swelling.  On neuro, she has had some  blurred vision today and some numbness that was partially relieved in  her left finger, but no dysarthria or weakness.   SOCIAL HISTORY:  She lives in [place].  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  Tobacco,  she smokes half pack per day for the last 33 years.  Alcohol, she drinks  3 6-packs a week of beer.  No current illicit drug use.   FAMILY HISTORY:  Her mother had a CVA and diabetes, liver cancer, and  hypertension.  Her father had diverticulosis and a CVA and he is  subsequently passed away. Her siblings, she has a half brother who has  had heart problems, requiring a defibrillator, but no history of an MI.   ALLERGIES:  She is allergic to CODEINE, LATEX, and SULFA.  No known food  allergies.   HOME MEDICATIONS:  1. Amitriptyline 25 mg 1-2 tablets p.o. nightly, this is recent.  2. Robinul 1 mg p.o. b.i.d.  3. Ibuprofen 600 mg as needed for pain.  4. Xanax 0.5 mg p.r.n. anxiety.  5. Cipro 500 mg as needed for diverticulitis flares, not currently      taking.  6. Actonel every Wednesdays for osteoporosis.  7. Vitamin D every Thursdays for vitamin D deficiency.   Also of note, she also sees Dr. Shirlee Limerick recently with [facility]  for counseling for her depression.   PHYSICAL EXAM:  VITAL SIGNS:  Temperature 96.6, pulse 66, respirations  25,  blood pressure 147/89, and pulse ox 100%.  GENERAL:  She is in mild distress.  She is currently with chest pain.  HEENT:  She is normocephalic, atraumatic.  Pupils equally, round and  reactive to light bilaterally.  Extraocular muscles were intact.  NECK:  Supple without JVD.  CARDIOVASCULAR:  She has regular rate and rhythm.  No murmurs, rubs, or  gallops.  No S3 noted.  LUNGS:  Clear to auscultation bilaterally without wheezes.  ABDOMEN:  Soft.  Hyperactive bowel sounds with no tenderness to  palpation.  BACK:  No tenderness to palpation cervical spine, and thoracic spine  without pain.  GE:  Deferred.  RECTAL:  Rectal exam was performed, no fecal occult blood negative.  No  masses noted.  EXTREMITIES:  No edema.  NEUROVASCULAR:  Cranial nerves II through XII were intact.  Her  sensation is appears normal except for the tips of her first, second and  third digits on her left fingers and the distal to the DIP joint.  MUSCULOSKELETAL:  She has full range of motion.  No shoulder pain, 5/5  strength bilaterally.  No worsening of shoulder upon Yergason or Speed  testing.   LABORATORY STUDIES:  D-dimer was negative.  PTT 25, PT 12.3, INR 0.9.  Sodium 141, potassium 4.2, chloride 106, bicarb 25, BUN 10 and  creatinine 0.9, glucose 109, white count 6.6, hemoglobin 13, hematocrit  38, platelets 317.  Cardiac point-care-enzyme CK-MB was less than 1.  Troponin was less than 0.05.  Myoglobin was 36.9.  EKG noted to have a  ST depression in leads III and aVF.  She had ST elevation and greater  than 1 mm and V3 is questionable whether she had 1 mm versus of ST  depression upon the review by Dr. Doylene Canard.  He states that this is just  more flat lines.  She has noted flipped T-waves as well in leads III,  aVF, V3.  Chest x-ray showed upper limits of normal heart size with a  mild bibasilar atelectasis.  No noted fluid.   ASSESSMENT AND PLAN:  Chest pain concerning for cardiac ischemia.  1. For  her chest pain, a code STEMI was called while in the emergency      room due to concern for  possibility of ST elevations in      consecutively leads.  Dr. Doylene Canard was in place and here the patient      was already receiving heparin IV drip, nitroglycerin drip, aspirin      325, and morphine 4 mg x2.  The patient was given Lopressor 5 mg IV      while in the ED as well as loaded with Plavix with 300 mg.  Upon      review by Dr. Doylene Canard and it was noted that he did not feel that      she had two contiguous leads with ST elevation so, the Code STEMI      was cancelled.  It is noted that she has a Q-wave, however, in lead      3 and changes in the inferior leads as well as a flat line in V3      with ST elevation in V2.  She has had noted a history of some      cardiac issues back in 6 months ago which she was never treated so      this could be concerning for a previous ischemia.  At this time,      Dr. Merrilee Jansky service will admit her to the Cardiology Service,      admit her to the CCU, continue on her heparin, morphine,      nitroglycerin drip, loaded her with Plavix.  Continue her      metoprolol, begin an ACE inhibitor or lisinopril 10 mg daily, and      also begin Lipitor 40 mg nightly. We will check a fasting lipid      profile and hemoglobin A1c.  The [facility] Service since we      do take care of the patient's, we will continue to be a consult for      her for medical management for her other issues.  It will be      decided depending on today's events whether our services will still      needed to be continued.  At this time, we will continue the      morphine for pain as well.  To restratify if she is a smoker, she      does not need patches at this time and she does need a smoking      cessation consult.  She also has hypercholesterolemia that will      need to be treated.  2. For depression, we are going to continue her amitriptyline at      bedtime.  At this time, she is  undergoing counseling. She was quite      tearful during the exam on him, but was not started on medicines      currently.  3. Diverticulosis and inflammatory bowel syndrome.  No meds currently      at this time.  However, Dr. Doylene Canard did want to start Protonix for      prophylaxis, and we did mention that there was possibly an      interaction between Protonix versus a Plavix and based on pathway,      but this is not yet determined to be of clinical significance so he      wanted to continue the Protonix at this time.  4. For hyperlipidemia, we will start Lipitor 40 mg.  Check a fasting      lipid profile in the morning.  5. For anxiety, we will continue  her Xanax 0.5 mg p.o. b.i.d. as      needed for anxiety.  6. For DVT prophylaxis, she is not currently and we will not add      Lovenox at his time.  We can consider SCDs depending on what      happens today, but she is already on heparin, aspirin, and Plavix,      significant bleed risks, so we will not add Lovenox at this time.      She is on bedrest.  7. After disposition, this will be pending the cardiology workup as      follows and will be happy to help coordinate her care back with      [place] once she is discharged.      Marzetta Merino, M.D.  Electronically [place] A. Walker Kehr, M.D.  Electronically Signed    ML/MEDQ  D:  10/06/2007  T:  10/06/2007  Job:  [id number]

## 2010-10-19 NOTE — Cardiovascular Report (Signed)
[name], [name]                   ACCOUNT NO.:  [id number]   MEDICAL RECORD NO.:  [id number]          PATIENT TYPE:  INP   LOCATION:  5035                         FACILITY:  [facility]   PHYSICIAN:  Birdie Riddle, M.D.  DATE OF BIRTH:  06-24-1955   DATE OF PROCEDURE:  10/08/2007  DATE OF DISCHARGE:                            CARDIAC CATHETERIZATION   REFERRING PHYSICIAN:  [place] A. Walker Kehr, MD of [facility] at [facility].   PROCEDURES:  Left heart catheterization, selective coronary angiography,  left ventricular function study.   INDICATIONS:  This 55 year old black female had typical chest pain and  EKG changes of  ischemia.   APPROACH:  Right femoral artery using 5-French sheath.   COMPLICATIONS:  None.   DYE:  Less than 45 mL of dye was used.   HEMODYNAMIC DATA:  The aortic pressure was 174/84, and left ventricular  pressure was 168/11.   LEFT VENTRICULOGRAM:  The left ventriculogram was normal with ejection  fraction of 70%.   CORONARY ANATOMY:  The left main coronary artery was short and  unremarkable.   Left anterior descending coronary artery was also unremarkable.  Diagonal 1 vessel was unremarkable.   Left circumflex coronary artery was dominant and was unremarkable.  The  ramus branch was a large vessel.  Obtuse marginal branch and posterior  descending coronary arteries were unremarkable.   Right coronary artery, the right coronary artery was nondominant and  unremarkable.   IMPRESSION:  1. Normal coronaries.  2. Normal left ventricle systolic function.   RECOMMENDATIONS:  This patient will have medical therapy for noncardiac  chest pain.      Birdie Riddle, M.D.  Electronically Signed     ASK/MEDQ  D:  10/08/2007  T:  10/08/2007  Job:  [id number]
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
## 2010-10-22 NOTE — Op Note (Signed)
   [name], [name]                             ACCOUNT NO.:  [id number]   MEDICAL RECORD NO.:  [id number]                   PATIENT TYPE:  INP   LOCATION:  6811                                 FACILITY:  [facility]   PHYSICIAN:  Delfin Edis, M.D. [facility]               DATE OF BIRTH:  Jan 21, 1956   DATE OF PROCEDURE:  02/04/2003  DATE OF DISCHARGE:                                 OPERATIVE REPORT   PROCEDURE:  Flexible sigmoscopy.   INDICATIONS:  This 55 year old African-American female was admitted with  persistent left lower quadrant abdominal pain refractory to outpatient  management.  She has known diverticulosis and has been treated as an  outpatient for diverticulitis with antibiotics.  The CT scan of the abdomen  did not show any evidence of inflammation in the left lower quadrant.  She  denies any fever.  Her usual bowel habits are constipated but since the CT  scan several days ago, she has been having diarrhea.  Because of the failure  of antispasmodics and dietary modification, she is admitted for further  evaluation.   ENDOSCOPE:  Olympus single-channel videoscope.   SEDATION:  1. Versed 5 mg IV.  2. Fentanyl 50 mcg IV.   FINDINGS:  Olympus single-channel video endoscope passed through rectum to  the sigmoid colon.  The patient was monitored by pulse oximetry, and oxygen  saturations were normal.  Her prep was excellent.  Anal canal and rectal  ampulla was unremarkable.  Colonoscope passed rather easily through the  sigmoid colon which showed several turns, large haustral folds, and  scattered deep diverticula.  The mucosa appeared normal.  There were no  inflammatory changes, and patient was rather comfortable throughout the  procedure.  There was no obstruction.  Splenic flexure was reached without  difficulty at 60-70 cm.  At that point, the mucosa appeared normal.  Colonoscope was then slowly retracted.  Video photographs of the diverticula  as well as of the large  haustral folds were taken.  The patient tolerated  the procedure well.   IMPRESSION:  Mild diverticulosis of the left colon.   PLAN:  The patient's abdominal pain is out of proportion to the objective  findings which show only mild diverticulosis with some haustral hypertrophy.  She will be treated with antispasmodics and bowel rest but at the same time,  we will evaluate for possible lumbosacral radiculopathy, doing MRI of the  back.                                               Delfin Edis, M.D. [facility]    DB/MEDQ  D:  02/04/2003  T:  02/04/2003  Job:  [id number]   cc:   Clarene Reamer, M.D. [facility]
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
## 2010-11-22 ENCOUNTER — Ambulatory Visit (INDEPENDENT_AMBULATORY_CARE_PROVIDER_SITE_OTHER): Payer: 59 | Admitting: Professional

## 2010-11-22 DIAGNOSIS — F331 Major depressive disorder, recurrent, moderate: Secondary | ICD-10-CM

## 2010-12-01 NOTE — Patient Instructions (Addendum)
[place]  12/01/2010   Your procedure is scheduled on: Tuesday  Report to [facility] at 1130 AM.  Call this number if you have problems the morning of surgery: [phone]   Remember:   Do not eat food:After Midnight.  Do not drink clear liquids: 4 Hours before arrival.  Take these medicines the morning of surgery with A SIP OF WATER: as per anesthesia   Do not wear jewelry, make-up or nail polish.  Do not bring valuables to the hospital.  Contacts, dentures or bridgework may not be worn into surgery.  Leave suitcase in the car. After surgery it may be brought to your room.  For patients admitted to the hospital, checkout time is 11:00 AM the day of discharge.   Patients discharged the day of surgery will not be allowed to drive home.  Name and phone number of your driver: friend unknown at PAT  Special Instructions: none  Please read over the following fact sheets that you were given:

## 2010-12-02 ENCOUNTER — Encounter (HOSPITAL_COMMUNITY)
Admission: RE | Admit: 2010-12-02 | Discharge: 2010-12-02 | Disposition: A | Discharge status: Home / Self Care | Payer: 59 | Source: Ambulatory Visit | Attending: Obstetrics & Gynecology | Admitting: Obstetrics & Gynecology

## 2010-12-02 ENCOUNTER — Encounter (HOSPITAL_COMMUNITY): Payer: Self-pay

## 2010-12-02 HISTORY — DX: Fibromyalgia: M79.7

## 2010-12-02 HISTORY — DX: Adverse effect of unspecified anesthetic, initial encounter: T41.45XA

## 2010-12-02 HISTORY — DX: Essential (primary) hypertension: I10

## 2010-12-02 HISTORY — DX: Anxiety disorder, unspecified: F41.9

## 2010-12-02 HISTORY — DX: Other complications of anesthesia, initial encounter: T88.59XA

## 2010-12-02 HISTORY — DX: Major depressive disorder, single episode, unspecified: F32.9

## 2010-12-02 HISTORY — DX: Depression, unspecified: F32.A

## 2010-12-02 HISTORY — DX: Gastro-esophageal reflux disease without esophagitis: K21.9

## 2010-12-02 HISTORY — DX: Unspecified osteoarthritis, unspecified site: M19.90

## 2010-12-02 LAB — COMPREHENSIVE METABOLIC PANEL
ALT: 57 U/L — ABNORMAL HIGH (ref 0–35)
Calcium: 9.8 mg/dL (ref 8.4–10.5)
Creatinine, Ser: 0.69 mg/dL (ref 0.50–1.10)
GFR calc Af Amer: >60 mL/min (ref >60)
Glucose, Bld: 89 mg/dL (ref 70–99)
Sodium: 140 mEq/L (ref 135–145)
Total Protein: 7.9 g/dL (ref 6.0–8.3)

## 2010-12-02 LAB — SURGICAL PCR SCREEN: Staphylococcus aureus: NEGATIVE

## 2010-12-02 LAB — CBC
MCH: 27.5 pg (ref 26.0–34.0)
MCHC: 32.7 g/dL (ref 30.0–36.0)
MCV: 83.9 fL (ref 78.0–100.0)
Platelets: 339 10*3/uL (ref 150–400)

## 2010-12-14 ENCOUNTER — Encounter (HOSPITAL_COMMUNITY): Payer: Self-pay | Admitting: Anesthesiology

## 2010-12-14 ENCOUNTER — Ambulatory Visit (HOSPITAL_COMMUNITY)
Admission: RE | Admit: 2010-12-14 | Discharge: 2010-12-14 | Disposition: A | Discharge status: Home / Self Care | Payer: 59 | Source: Ambulatory Visit | Attending: Obstetrics & Gynecology | Admitting: Obstetrics & Gynecology

## 2010-12-14 MED ORDER — DEXTROSE 5 % IV SOLN
1.0000 g | Freq: Once | INTRAVENOUS | Status: DC
  Filled 2010-12-14: qty 1

## 2010-12-14 MED ORDER — DEXTROSE 5 % IV SOLN
1.0000 g | Freq: Four times a day (QID) | INTRAVENOUS | Status: DC
  Filled 2010-12-14: qty 1

## 2010-12-14 MED ORDER — LACTATED RINGERS IV SOLN
INTRAVENOUS | Status: DC
  Administered 2010-12-14: 12:00:00 via INTRAVENOUS

## 2010-12-14 NOTE — Progress Notes (Signed)
Pt's surgery, Rt oophorectomy, poss. Lt ov. cystectomy, Lt oophorectomy assisted with da Vinci was scheduled at 13:00.  The first robotic case scheduled at 7:30 with another surgeon ran over time.  As of now, the best estimate as that case is still far from being completed, is that her surgery could not start before 17:00.  I cannot start her surgery at that time.  Pt was therefore informed of the need to reschedule.  I explained that the delay and need to reschedule was out of my control.  I expressed that I was sorry and understood her reaction.  I offered her to reschedule her surgery Friday 7/13th, but the patient states that she cannot go through another bowel prep and the stress of preparing for surgery at such a short interval.  Finally, we agreed to reschedule her surgery for the end of August, first case at 7:30 am and do a Phospho Soda bowel prep only.  Percocet PRN prescribed.  Patient comfort assured and food ordered for her.  Discharged home.

## 2010-12-14 NOTE — Anesthesia Preprocedure Evaluation (Deleted)
Anesthesia Evaluation   Patient awake  General Assessment Comment  Reviewed: Allergy & Precautions  History of Anesthesia Complications (+) PROLONGED INTUBATION  Airway       Dental   Pulmonary      Cardiovascular    Neuro/Psych  GI/Hepatic/Renal   Endo/Other   Abdominal   Musculoskeletal  Hematology   Peds  Reproductive/Obstetrics          Anesthesia Physical Anesthesia Plan Anesthesia Quick Evaluation

## 2010-12-30 ENCOUNTER — Ambulatory Visit (INDEPENDENT_AMBULATORY_CARE_PROVIDER_SITE_OTHER): Payer: 59 | Admitting: Professional

## 2010-12-30 DIAGNOSIS — F331 Major depressive disorder, recurrent, moderate: Secondary | ICD-10-CM

## 2010-12-31 NOTE — H&P (Signed)
Refer to "progress note".  Surgery rescheduled. Princess Bruins MD

## 2011-01-27 ENCOUNTER — Encounter (HOSPITAL_COMMUNITY): Admission: RE | Payer: Self-pay | Source: Ambulatory Visit

## 2011-01-27 ENCOUNTER — Ambulatory Visit (INDEPENDENT_AMBULATORY_CARE_PROVIDER_SITE_OTHER): Payer: 59 | Admitting: Professional

## 2011-01-27 ENCOUNTER — Ambulatory Visit: Admit: 2011-01-27 | Payer: Self-pay | Admitting: Obstetrics & Gynecology

## 2011-01-27 ENCOUNTER — Ambulatory Visit (HOSPITAL_COMMUNITY): Admission: RE | Admit: 2011-01-27 | Payer: 59 | Source: Ambulatory Visit | Admitting: Obstetrics & Gynecology

## 2011-01-27 DIAGNOSIS — F331 Major depressive disorder, recurrent, moderate: Secondary | ICD-10-CM

## 2011-01-27 SURGERY — ROBOTIC ASSISTED LAPAROSCOPIC LYSIS OF ADHESION
Anesthesia: General

## 2011-01-27 SURGERY — ROBOTIC ASSISTED LAPAROSCOPIC VAGINAL HYSTERECTOMY
Anesthesia: General

## 2011-01-27 SURGICAL SUPPLY — 61 items
BARRIER ADHS 3X4 INTERCEED (GAUZE/BANDAGES/DRESSINGS) ×1 IMPLANT
BLADE LAPAROSCOPIC MORCELL KIT (BLADE) IMPLANT
BLADELESS LONG 8MM (BLADE) IMPLANT
BRR ADH 4X3 ABS CNTRL BYND (GAUZE/BANDAGES/DRESSINGS)
CABLE HIGH FREQUENCY MONO STRZ (ELECTRODE) ×1 IMPLANT
CLOTH BEACON ORANGE TIMEOUT ST (SAFETY) ×1 IMPLANT
CONT PATH 16OZ SNAP LID 3702 (MISCELLANEOUS) ×1 IMPLANT
COVER MAYO STAND STRL (DRAPES) ×1 IMPLANT
COVER TABLE BACK 60X90 (DRAPES) IMPLANT
COVER TIP SHEARS 8 DVNC (MISCELLANEOUS) ×1 IMPLANT
COVER TIP SHEARS 8MM DA VINCI (MISCELLANEOUS)
DECANTER SPIKE VIAL GLASS SM (MISCELLANEOUS) ×1 IMPLANT
DERMABOND ADVANCED (GAUZE/BANDAGES/DRESSINGS) ×2 IMPLANT
DRAPE HUG U DISPOSABLE (DRAPE) IMPLANT
DRAPE LG THREE QUARTER DISP (DRAPES) IMPLANT
DRAPE MONITOR DA VINCI (DRAPE) ×1 IMPLANT
DRAPE UTILITY XL STRL (DRAPES) ×1 IMPLANT
DRAPE WARM FLUID 44X44 (DRAPE) ×1 IMPLANT
ELECT REM PT RETURN 9FT ADLT (ELECTROSURGICAL)
ELECTRODE REM PT RTRN 9FT ADLT (ELECTROSURGICAL) ×1 IMPLANT
EVACUATOR SMOKE 8.L (FILTER) ×1 IMPLANT
GAUZE VASELINE 3X9 (GAUZE/BANDAGES/DRESSINGS) IMPLANT
GLOVE BIO SURGEON STRL SZ 6.5 (GLOVE) ×2 IMPLANT
GOWN PREVENTION PLUS LG XLONG (DISPOSABLE) IMPLANT
IV STOPCOCK 4 WAY 40  W/Y SET (IV SOLUTION)
IV STOPCOCK 4 WAY 40 W/Y SET (IV SOLUTION) ×1 IMPLANT
KIT DISP ACCESSORY 4 ARM (KITS) ×1 IMPLANT
NEEDLE HYPO 22GX1.5 SAFETY (NEEDLE) IMPLANT
NEEDLE INSUFFLATION 14GA 120MM (NEEDLE) IMPLANT
NS IRRIG 1000ML POUR BTL (IV SOLUTION) ×3 IMPLANT
OCCLUDER COLPOPNEUMO (BALLOONS) IMPLANT
PACK LAVH (CUSTOM PROCEDURE TRAY) ×1 IMPLANT
PAD PREP 24X48 CUFFED NSTRL (MISCELLANEOUS) IMPLANT
POSITIONER SURGICAL ARM (MISCELLANEOUS) ×2 IMPLANT
SET IRRIG TUBING LAPAROSCOPIC (IRRIGATION / IRRIGATOR) ×2 IMPLANT
SOLUTION ELECTROLUBE (MISCELLANEOUS) ×1 IMPLANT
SPONGE LAP 18X18 X RAY DECT (DISPOSABLE) IMPLANT
SUT VIC AB 0 CT1 27 (SUTURE)
SUT VIC AB 0 CT1 27XBRD ANTBC (SUTURE) ×5 IMPLANT
SUT VIC AB 0 CT2 27 (SUTURE) IMPLANT
SUT VIC AB 2-0 CT1 27 (SUTURE)
SUT VIC AB 2-0 CT1 TAPERPNT 27 (SUTURE) IMPLANT
SUT VIC AB 3-0 SH 27 (SUTURE)
SUT VIC AB 3-0 SH 27X BRD (SUTURE) IMPLANT
SUT VIC AB 4-0 PS2 27 (SUTURE) IMPLANT
SUT VICRYL 0 UR6 27IN ABS (SUTURE) ×2 IMPLANT
SYR 50ML LL SCALE MARK (SYRINGE) ×1 IMPLANT
SYSTEM CONVERTIBLE TROCAR (TROCAR) IMPLANT
TIP UTERINE 5.1X6CM LAV DISP (MISCELLANEOUS) IMPLANT
TIP UTERINE 6.7X10CM GRN DISP (MISCELLANEOUS) IMPLANT
TIP UTERINE 6.7X6CM WHT DISP (MISCELLANEOUS) IMPLANT
TIP UTERINE 6.7X8CM BLUE DISP (MISCELLANEOUS) IMPLANT
TOWEL OR 17X24 6PK STRL BLUE (TOWEL DISPOSABLE) ×3 IMPLANT
TRAY FOLEY BAG SILVER LF 14FR (CATHETERS) IMPLANT
TROCAR 12M 150ML BLUNT (TROCAR) IMPLANT
TROCAR DISP BLADELESS 8 DVNC (TROCAR) ×1 IMPLANT
TROCAR DISP BLADELESS 8MM (TROCAR)
TROCAR XCEL 12X100 BLDLESS (ENDOMECHANICALS) ×1 IMPLANT
TROCAR Z-THREAD BLADED 12X100M (TROCAR) ×1 IMPLANT
TUBING FILTER THERMOFLATOR (ELECTROSURGICAL) ×2 IMPLANT
WARMER LAPAROSCOPE (MISCELLANEOUS) IMPLANT

## 2011-02-23 ENCOUNTER — Other Ambulatory Visit (HOSPITAL_COMMUNITY): Payer: Self-pay | Admitting: Cardiovascular Disease

## 2011-02-24 ENCOUNTER — Ambulatory Visit (INDEPENDENT_AMBULATORY_CARE_PROVIDER_SITE_OTHER): Payer: 59 | Admitting: Professional

## 2011-02-24 DIAGNOSIS — F331 Major depressive disorder, recurrent, moderate: Secondary | ICD-10-CM

## 2011-03-03 ENCOUNTER — Ambulatory Visit (HOSPITAL_COMMUNITY)
Admission: RE | Admit: 2011-03-03 | Discharge: 2011-03-03 | Disposition: A | Discharge status: Home / Self Care | Payer: 59 | Source: Ambulatory Visit | Attending: Cardiovascular Disease | Admitting: Cardiovascular Disease

## 2011-03-03 ENCOUNTER — Encounter (HOSPITAL_COMMUNITY)
Admission: RE | Admit: 2011-03-03 | Discharge: 2011-03-03 | Disposition: A | Discharge status: Home / Self Care | Payer: 59 | Source: Ambulatory Visit | Attending: Cardiovascular Disease | Admitting: Cardiovascular Disease

## 2011-03-03 DIAGNOSIS — R079 Chest pain, unspecified: Secondary | ICD-10-CM | POA: Insufficient documentation

## 2011-03-03 DIAGNOSIS — E7800 Pure hypercholesterolemia, unspecified: Secondary | ICD-10-CM | POA: Insufficient documentation

## 2011-03-03 DIAGNOSIS — I10 Essential (primary) hypertension: Secondary | ICD-10-CM | POA: Insufficient documentation

## 2011-03-03 DIAGNOSIS — Z8249 Family history of ischemic heart disease and other diseases of the circulatory system: Secondary | ICD-10-CM | POA: Insufficient documentation

## 2011-03-03 DIAGNOSIS — E119 Type 2 diabetes mellitus without complications: Secondary | ICD-10-CM | POA: Insufficient documentation

## 2011-03-03 MED ORDER — TECHNETIUM TC 99M TETROFOSMIN IV KIT
10.0000 | PACK | Freq: Once | INTRAVENOUS | Status: AC | PRN
  Administered 2011-03-03: 10 via INTRAVENOUS

## 2011-03-03 MED ORDER — TECHNETIUM TC 99M TETROFOSMIN IV KIT
30.0000 | PACK | Freq: Once | INTRAVENOUS | Status: AC | PRN
  Administered 2011-03-03: 30 via INTRAVENOUS

## 2011-03-07 LAB — POCT I-STAT 3, ART BLOOD GAS (G3+)
Acid-Base Excess: 2
O2 Saturation: 99
Patient temperature: 97
pO2, Arterial: 132 — ABNORMAL HIGH

## 2011-03-07 LAB — POCT I-STAT, CHEM 8
Creatinine, Ser: 0.8
HCT: 42
Hemoglobin: 14.3
Potassium: 4.3
Sodium: 141
TCO2: 27

## 2011-03-07 LAB — POCT CARDIAC MARKERS
CKMB, poc: <1 — ABNORMAL LOW
Myoglobin, poc: 29.2
Troponin i, poc: <0.05

## 2011-03-17 ENCOUNTER — Ambulatory Visit (INDEPENDENT_AMBULATORY_CARE_PROVIDER_SITE_OTHER): Payer: 59 | Admitting: Professional

## 2011-03-17 DIAGNOSIS — F331 Major depressive disorder, recurrent, moderate: Secondary | ICD-10-CM

## 2011-04-07 HISTORY — PX: BILATERAL SALPINGOOPHORECTOMY: SHX1223

## 2011-04-14 ENCOUNTER — Ambulatory Visit (INDEPENDENT_AMBULATORY_CARE_PROVIDER_SITE_OTHER): Payer: 59 | Admitting: Professional

## 2011-04-14 DIAGNOSIS — F331 Major depressive disorder, recurrent, moderate: Secondary | ICD-10-CM

## 2011-05-24 ENCOUNTER — Ambulatory Visit (INDEPENDENT_AMBULATORY_CARE_PROVIDER_SITE_OTHER): Payer: 59 | Admitting: Physician Assistant

## 2011-05-24 DIAGNOSIS — Z0000 Encounter for general adult medical examination without abnormal findings: Secondary | ICD-10-CM

## 2011-05-24 DIAGNOSIS — E782 Mixed hyperlipidemia: Secondary | ICD-10-CM

## 2011-05-24 DIAGNOSIS — J019 Acute sinusitis, unspecified: Secondary | ICD-10-CM

## 2011-05-30 ENCOUNTER — Encounter: Payer: Self-pay | Admitting: Physician Assistant

## 2011-05-30 DIAGNOSIS — J45909 Unspecified asthma, uncomplicated: Secondary | ICD-10-CM

## 2011-05-30 DIAGNOSIS — E782 Mixed hyperlipidemia: Secondary | ICD-10-CM

## 2011-06-21 ENCOUNTER — Ambulatory Visit: Payer: 59 | Admitting: Professional

## 2011-07-05 ENCOUNTER — Ambulatory Visit (INDEPENDENT_AMBULATORY_CARE_PROVIDER_SITE_OTHER): Payer: 59 | Admitting: Professional

## 2011-07-05 DIAGNOSIS — Z0271 Encounter for disability determination: Secondary | ICD-10-CM

## 2011-07-05 DIAGNOSIS — F331 Major depressive disorder, recurrent, moderate: Secondary | ICD-10-CM

## 2011-07-25 ENCOUNTER — Other Ambulatory Visit: Payer: Self-pay | Admitting: Physician Assistant

## 2011-07-26 ENCOUNTER — Ambulatory Visit: Payer: 59 | Admitting: Professional

## 2011-07-27 ENCOUNTER — Other Ambulatory Visit: Payer: Self-pay

## 2011-08-05 ENCOUNTER — Telehealth: Payer: Self-pay

## 2011-08-05 NOTE — Telephone Encounter (Signed)
Spoke with [name], we are unable to call in ABX now she would have to wait until her OV. She can take Mucinex OTC for now, [facility] with info

## 2011-08-05 NOTE — Telephone Encounter (Signed)
.  [facility] PT WOULD LIKE TO HAVE SOMETHING CALLED IN FOR A SINUS INFECTION. ALREADY HAVE AN APPT COMING UP.     [facility] ON [address]

## 2011-08-07 ENCOUNTER — Ambulatory Visit (INDEPENDENT_AMBULATORY_CARE_PROVIDER_SITE_OTHER): Payer: 59 | Admitting: Physician Assistant

## 2011-08-07 VITALS — BP 138/74 | HR 43 | Temp 98.5°F | Resp 14 | Ht 67.0 in | Wt 167.0 lb

## 2011-08-07 DIAGNOSIS — J019 Acute sinusitis, unspecified: Secondary | ICD-10-CM

## 2011-08-07 DIAGNOSIS — R059 Cough, unspecified: Secondary | ICD-10-CM

## 2011-08-07 DIAGNOSIS — R05 Cough: Secondary | ICD-10-CM

## 2011-08-07 MED ORDER — PROMETHAZINE-DM 6.25-15 MG/5ML PO SYRP: 5.0000 mL | ORAL_SOLUTION | Freq: Four times a day (QID) | ORAL | Status: AC | PRN

## 2011-08-07 MED ORDER — AMOXICILLIN 500 MG PO CAPS: ORAL_CAPSULE | ORAL | Status: DC

## 2011-08-07 NOTE — Progress Notes (Signed)
  Subjective:    Patient ID: [name], female    DOB: 12-Jul-1955, 56 y.o.   MRN: [id number]  HPI >1week h/o URI s/Sx getting progressively worse.  Now having sinus pain and pressure and blowing discolored mucus from sinuses.  +cough at night. No f/c   Review of Systems  All other systems reviewed and are negative.       Objective:   Physical Exam  Nursing note reviewed. Constitutional: She is oriented to person, place, and time. She appears well-developed and well-nourished.  HENT:  Head: Normocephalic and atraumatic.  Mouth/Throat: Oropharyngeal exudate (PND) present.       Maxillary sinuses TTP  Neck: Normal range of motion. Neck supple. No thyromegaly present.  Cardiovascular: Normal rate, regular rhythm and normal heart sounds.   Pulmonary/Chest: Effort normal and breath sounds normal. No respiratory distress.  Lymphadenopathy:    She has no cervical adenopathy.  Neurological: She is alert and oriented to person, place, and time.          Assessment & Plan:  URI w/ sinusitis Cold air humidifier, fluids, rest, amoxicillin, continue nasal spray.  Phenergan dm for nighttime.

## 2011-08-07 NOTE — Patient Instructions (Signed)
Cold air humidifier

## 2011-08-09 ENCOUNTER — Ambulatory Visit (INDEPENDENT_AMBULATORY_CARE_PROVIDER_SITE_OTHER): Payer: 59 | Admitting: Professional

## 2011-08-09 DIAGNOSIS — F331 Major depressive disorder, recurrent, moderate: Secondary | ICD-10-CM

## 2011-08-30 ENCOUNTER — Encounter: Payer: Self-pay | Admitting: Physician Assistant

## 2011-08-30 ENCOUNTER — Ambulatory Visit (INDEPENDENT_AMBULATORY_CARE_PROVIDER_SITE_OTHER): Payer: 59 | Admitting: Physician Assistant

## 2011-08-30 VITALS — BP 125/69 | HR 48 | Temp 98.1°F | Resp 20 | Ht 67.0 in | Wt 164.0 lb

## 2011-08-30 DIAGNOSIS — K589 Irritable bowel syndrome without diarrhea: Secondary | ICD-10-CM

## 2011-08-30 DIAGNOSIS — L409 Psoriasis, unspecified: Secondary | ICD-10-CM

## 2011-08-30 DIAGNOSIS — L408 Other psoriasis: Secondary | ICD-10-CM

## 2011-08-30 DIAGNOSIS — G4700 Insomnia, unspecified: Secondary | ICD-10-CM

## 2011-08-30 DIAGNOSIS — I10 Essential (primary) hypertension: Secondary | ICD-10-CM

## 2011-08-30 MED ORDER — CALCIPOTRIENE-BETAMETH DIPROP 0.005-0.064 % EX OINT: 1.0000 "application " | TOPICAL_OINTMENT | Freq: Every day | CUTANEOUS | Status: DC

## 2011-08-30 MED ORDER — GLYCOPYRROLATE 2 MG PO TABS: 2.0000 mg | ORAL_TABLET | Freq: Every day | ORAL | Status: DC | PRN

## 2011-08-30 MED ORDER — AMITRIPTYLINE HCL 25 MG PO TABS: 12.5000 mg | ORAL_TABLET | Freq: Every day | ORAL | Status: DC

## 2011-08-30 MED ORDER — AMLODIPINE BESYLATE 5 MG PO TABS: 2.5000 mg | ORAL_TABLET | Freq: Every day | ORAL | Status: DC

## 2011-08-30 NOTE — Patient Instructions (Signed)
QUIT SMOKING!  Apply a warm compress or heating pad to the sore muscles in your neck to help increase the blood flow and reduce the pain.  Use the Skelaxin [facility]) as needed as well.

## 2011-08-30 NOTE — Progress Notes (Signed)
  Subjective:    Patient ID: [name], female    DOB: 06/08/1955, 56 y.o.   MRN: [id number]  HPI Presents for follow-up of multiple medical problems.  Episode of depression about 1 month ago.  Reached out to her pastor, had a re-blessing of her home, and her symptoms resolved.  Has right neck pain and reduced ability to rotate to the right x 3 weeks.  A new pillow has helped some.  Worse at night.  Recent sinusitis.  Treated with Amoxicillin.  Almost completely well.  Still not ready for a flu vaccine this season.  Needs a few refills.  Feels sick and tired.  Hot flashes day and night, keeps her from sleeping well. May only get 3-4 hours of sleep at night.  Anxiety keeps her from being able to relax, and just "be."  "I've got to keep moving." Gynecologist suggested restarting Elavil (it's been on her medication list, but she states that she hasn't taken it since we started the citalopram-she didn't understand that she was to take them both).  HA.  Review of Systems As above.  No CP, SOB, dizziness.    Objective:   Physical Exam Vital signs noted. Well-developed, well nourished BF who is awake, alert and oriented, in NAD. Mood is cheerful. HEENT: [place]/AT, sclera and conjunctiva are clear.   Neck: supple, non-tender, no lymphadenopathy, thyromegaly. Heart: RRR, no murmur Lungs: CTA Musculoskeletal: Diffuse tenderness, her baseline, with increased tenderness in the right trapezius.  Palpable spasm. Extremities: no cyanosis, clubbing or edema. Skin: warm and dry without rash.        Assessment & Plan:   1. HTN (hypertension)  amLODipine (NORVASC) 5 MG tablet  2. Insomnia  amitriptyline (ELAVIL) 25 MG tablet   3. Psoriasis  calcipotriene-betamethasone (TACLONEX) ointment  4. IBS (irritable bowel syndrome)  glycopyrrolate (ROBINUL) 2 MG tablet   Re-evaluate 3 months, sooner if needed.  Temporary handicap placard form completed, as her current placard is about to expire.

## 2011-09-02 ENCOUNTER — Other Ambulatory Visit: Payer: Self-pay | Admitting: Physician Assistant

## 2011-09-06 ENCOUNTER — Ambulatory Visit (INDEPENDENT_AMBULATORY_CARE_PROVIDER_SITE_OTHER): Payer: 59 | Admitting: Professional

## 2011-09-06 DIAGNOSIS — F331 Major depressive disorder, recurrent, moderate: Secondary | ICD-10-CM

## 2011-10-11 ENCOUNTER — Ambulatory Visit (INDEPENDENT_AMBULATORY_CARE_PROVIDER_SITE_OTHER): Payer: 59 | Admitting: Professional

## 2011-10-11 DIAGNOSIS — F331 Major depressive disorder, recurrent, moderate: Secondary | ICD-10-CM

## 2011-10-25 ENCOUNTER — Ambulatory Visit (INDEPENDENT_AMBULATORY_CARE_PROVIDER_SITE_OTHER): Payer: 59 | Admitting: Professional

## 2011-10-25 DIAGNOSIS — F331 Major depressive disorder, recurrent, moderate: Secondary | ICD-10-CM

## 2011-11-04 ENCOUNTER — Ambulatory Visit (INDEPENDENT_AMBULATORY_CARE_PROVIDER_SITE_OTHER): Payer: 59 | Admitting: Physician Assistant

## 2011-11-04 VITALS — BP 168/80 | HR 71 | Temp 98.2°F | Resp 17 | Ht 67.5 in | Wt 164.0 lb

## 2011-11-04 DIAGNOSIS — J069 Acute upper respiratory infection, unspecified: Secondary | ICD-10-CM

## 2011-11-04 MED ORDER — AMOXICILLIN 875 MG PO TABS: 875.0000 mg | ORAL_TABLET | Freq: Two times a day (BID) | ORAL | Status: AC

## 2011-11-04 MED ORDER — ALBUTEROL SULFATE HFA 108 (90 BASE) MCG/ACT IN AERS: 2.0000 | INHALATION_SPRAY | RESPIRATORY_TRACT | Status: DC | PRN
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
## 2011-11-07 ENCOUNTER — Encounter: Payer: Self-pay | Admitting: Family Medicine

## 2011-11-07 ENCOUNTER — Emergency Department (HOSPITAL_COMMUNITY)
Admission: EM | Admit: 2011-11-07 | Discharge: 2011-11-07 | Disposition: A | Discharge status: Home / Self Care | Payer: 59 | Attending: Emergency Medicine | Admitting: Emergency Medicine

## 2011-11-07 ENCOUNTER — Emergency Department (HOSPITAL_COMMUNITY): Payer: 59

## 2011-11-07 ENCOUNTER — Ambulatory Visit: Payer: 59

## 2011-11-07 ENCOUNTER — Ambulatory Visit (INDEPENDENT_AMBULATORY_CARE_PROVIDER_SITE_OTHER): Payer: 59 | Admitting: Family Medicine

## 2011-11-07 ENCOUNTER — Encounter (HOSPITAL_COMMUNITY): Payer: Self-pay | Admitting: Emergency Medicine

## 2011-11-07 VITALS — BP 129/79 | HR 54 | Temp 98.3°F | Resp 18

## 2011-11-07 DIAGNOSIS — Z8739 Personal history of other diseases of the musculoskeletal system and connective tissue: Secondary | ICD-10-CM | POA: Insufficient documentation

## 2011-11-07 DIAGNOSIS — R079 Chest pain, unspecified: Secondary | ICD-10-CM | POA: Insufficient documentation

## 2011-11-07 DIAGNOSIS — I10 Essential (primary) hypertension: Secondary | ICD-10-CM | POA: Insufficient documentation

## 2011-11-07 DIAGNOSIS — R0600 Dyspnea, unspecified: Secondary | ICD-10-CM

## 2011-11-07 DIAGNOSIS — Z79899 Other long term (current) drug therapy: Secondary | ICD-10-CM | POA: Insufficient documentation

## 2011-11-07 DIAGNOSIS — R05 Cough: Secondary | ICD-10-CM

## 2011-11-07 DIAGNOSIS — J40 Bronchitis, not specified as acute or chronic: Secondary | ICD-10-CM

## 2011-11-07 DIAGNOSIS — R0602 Shortness of breath: Secondary | ICD-10-CM

## 2011-11-07 DIAGNOSIS — IMO0001 Reserved for inherently not codable concepts without codable children: Secondary | ICD-10-CM | POA: Insufficient documentation

## 2011-11-07 DIAGNOSIS — R059 Cough, unspecified: Secondary | ICD-10-CM

## 2011-11-07 DIAGNOSIS — K219 Gastro-esophageal reflux disease without esophagitis: Secondary | ICD-10-CM | POA: Insufficient documentation

## 2011-11-07 DIAGNOSIS — M797 Fibromyalgia: Secondary | ICD-10-CM

## 2011-11-07 LAB — BASIC METABOLIC PANEL
CO2: 27 mEq/L (ref 19–32)
Glucose, Bld: 88 mg/dL (ref 70–99)
Potassium: 4 mEq/L (ref 3.5–5.1)
Sodium: 138 mEq/L (ref 135–145)

## 2011-11-07 LAB — CBC
MCV: 82.2 fL (ref 78.0–100.0)
Platelets: 343 10*3/uL (ref 150–400)
RBC: 4.56 MIL/uL (ref 3.87–5.11)
WBC: 6.7 10*3/uL (ref 4.0–10.5)

## 2011-11-07 LAB — POCT CBC
Granulocyte percent: 48.7 %G (ref 37–80)
HCT, POC: 41.2 % (ref 37.7–47.9)
Hemoglobin: 12.8 g/dL (ref 12.2–16.2)
Lymph, poc: 3.2 (ref 0.6–3.4)
MCH, POC: 26.1 pg — AB (ref 27–31.2)
MCHC: 31.1 g/dL — AB (ref 31.8–35.4)
MCV: 84 fL (ref 80–97)
MID (cbc): 0.5 (ref 0–0.9)
MPV: 7 fL (ref 0–99.8)
POC Granulocyte: 3.6 (ref 2–6.9)
POC LYMPH PERCENT: 44.2 %L (ref 10–50)
POC MID %: 7.1 %M (ref 0–12)
Platelet Count, POC: 425 10*3/uL — AB (ref 142–424)
RBC: 4.9 M/uL (ref 4.04–5.48)
RDW, POC: 14.5 %
WBC: 7.3 10*3/uL (ref 4.6–10.2)

## 2011-11-07 LAB — D-DIMER, QUANTITATIVE: D-Dimer, Quant: <0.22 ug/mL-FEU (ref 0.00–0.48)

## 2011-11-07 LAB — DIFFERENTIAL
Eosinophils Relative: 1 % (ref 0–5)
Lymphocytes Relative: 45 % (ref 12–46)
Lymphs Abs: 3 10*3/uL (ref 0.7–4.0)
Neutrophils Relative %: 45 % (ref 43–77)

## 2011-11-07 MED ORDER — SODIUM CHLORIDE 0.9 % IV BOLUS (SEPSIS)
250.0000 mL | Freq: Once | INTRAVENOUS | Status: AC
  Administered 2011-11-07: 1000 mL via INTRAVENOUS

## 2011-11-07 MED ORDER — ALBUTEROL SULFATE (2.5 MG/3ML) 0.083% IN NEBU
2.5000 mg | INHALATION_SOLUTION | Freq: Once | RESPIRATORY_TRACT | Status: AC
  Administered 2011-11-07: 2.5 mg via RESPIRATORY_TRACT

## 2011-11-07 MED ORDER — HYDROMORPHONE HCL PF 1 MG/ML IJ SOLN
1.0000 mg | Freq: Once | INTRAMUSCULAR | Status: AC
  Administered 2011-11-07: 1 mg via INTRAVENOUS
  Filled 2011-11-07: qty 1

## 2011-11-07 MED ORDER — SODIUM CHLORIDE 0.9 % IV SOLN: INTRAVENOUS | Status: DC

## 2011-11-07 MED ORDER — ONDANSETRON HCL 4 MG/2ML IJ SOLN
4.0000 mg | Freq: Once | INTRAMUSCULAR | Status: AC
  Administered 2011-11-07: 4 mg via INTRAVENOUS
  Filled 2011-11-07: qty 2

## 2011-11-07 MED ORDER — HYDROMORPHONE HCL 2 MG PO TABS: 2.0000 mg | ORAL_TABLET | ORAL | Status: AC | PRN

## 2011-11-07 NOTE — ED Notes (Signed)
  Pt to ED via [facility] from a Urgent Care on [address]  Pt was there on Fri and dx with URI returned today ref. Chest pain that she said started last pm.  Pt also st's she is hurting all over due to her fibromyalgia.
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
## 2011-11-07 NOTE — Progress Notes (Signed)
   [name] is a 56 year old woman with a history of asthma who comes in with 7 days of upper upper respiratory infection symptoms. She was seen 3 days ago and thought to have a sinus infection. She was started on amoxicillin. Over the weekend she became progressively more short of breath with cough productive of green phlegm. No nausea or vomiting  In addition patient is noted sharp left chest pain that's been intermittent.  Objective: Patient brought back urgently, and acute distress with dyspnea.  HEENT: Minimal erythema in the posterior pharynx, normal TMs, normal eye movement and inspection. Neck: Supple no adenopathy or thyromegaly Chest: Bibasilar rales, congested cough and tachypnea-pulse ox 100% Heart: Regular with 1/6 systolic ejection type murmur, no rub no murmur Abdomen: Soft nontender  [facility] reading (PRIMARY) by  Dr. Joseph Art:  No infiltrate, normal heart size EKG: T wave inversion III and precordial leads. Results for orders placed in visit on 11/07/11  POCT CBC      Component Value Range   WBC 7.3  4.6 - 10.2 (K/uL)   Lymph, poc 3.2  0.6 - 3.4    POC LYMPH PERCENT 44.2  10 - 50 (%L)   MID (cbc) 0.5  0 - 0.9    POC MID % 7.1  0 - 12 (%M)   POC Granulocyte 3.6  2 - 6.9    Granulocyte percent 48.7  37 - 80 (%G)   RBC 4.90  4.04 - 5.48 (M/uL)   Hemoglobin 12.8  12.2 - 16.2 (g/dL)   HCT, POC 41.2  37.7 - 47.9 (%)   MCV 84.0  80 - 97 (fL)   MCH, POC 26.1 (*) 27 - 31.2 (pg)   MCHC 31.1 (*) 31.8 - 35.4 (g/dL)   RDW, POC 14.5     Platelet Count, POC 425.0 (*) 142 - 424 (K/uL)   MPV 7.0  0 - 99.8 (fL)     Patient improved with nebulizer treatment but we were unable to start an IV because patient was uncooperative  Assessment: With the left chest pain (pleuritic in nature) and acute illness of breath, I feel that it is important to rule out PE. Patient definitely has difficult because of her mental illness.  Plan: Transfer to emergency department for further evaluation  and observation.

## 2011-11-07 NOTE — ED Notes (Signed)
EMS gave pt Baby ASA 361m.

## 2011-11-07 NOTE — Discharge Instructions (Signed)
Bronchitis Bronchitis is a problem of the air tubes leading to your lungs. This problem makes it hard for air to get in and out of the lungs. You may cough a lot because your air tubes are narrow. Going without care can cause lasting (chronic) bronchitis. HOME CARE   Drink enough fluids to keep your pee (urine) clear or pale yellow.   Use a cool mist humidifier.   Quit smoking if you smoke. If you keep smoking, the bronchitis might not get better.   Only take medicine as told by your doctor.  GET HELP RIGHT AWAY IF:   Coughing keeps you awake.   You start to wheeze.   You become more sick or weak.   You have a hard time breathing or get short of breath.   You cough up blood.   Coughing lasts more than 2 weeks.   You have a fever.   Your baby is older than 3 months with a rectal temperature of 102 F (38.9 C) or higher.   Your baby is 65 months old or younger with a rectal temperature of 100.4 F (38 C) or higher.  MAKE SURE YOU:  Understand these instructions.   Will watch your condition.   Will get help right away if you are not doing well or get worse.  Document Released: 11/09/2007 Document Revised: 05/12/2011 Document Reviewed: 04/24/2009 [facility] Patient Information 2012 [place].  Continue your antibiotic until finished. Important to get your albuterol inhaler prescription filled from Friday 2 puffs every 6 hours for the next week. Can supplement your current pain medicine with the hydromorphone. Also recommend Mucinex DM 12 hour for the cough and congestion. Followup with your regular Dr. in the next few days if not better. Return for any new or worse symptoms.

## 2011-11-10 ENCOUNTER — Encounter (INDEPENDENT_AMBULATORY_CARE_PROVIDER_SITE_OTHER): Payer: Self-pay | Admitting: Surgery

## 2011-11-14 ENCOUNTER — Other Ambulatory Visit: Payer: Self-pay | Admitting: Physician Assistant

## 2011-11-15 ENCOUNTER — Ambulatory Visit: Payer: 59 | Admitting: Professional

## 2011-11-22 ENCOUNTER — Ambulatory Visit (INDEPENDENT_AMBULATORY_CARE_PROVIDER_SITE_OTHER): Payer: 59 | Admitting: Professional

## 2011-11-22 ENCOUNTER — Ambulatory Visit (INDEPENDENT_AMBULATORY_CARE_PROVIDER_SITE_OTHER): Payer: 59 | Admitting: Physician Assistant

## 2011-11-22 ENCOUNTER — Encounter: Payer: Self-pay | Admitting: Physician Assistant

## 2011-11-22 VITALS — BP 137/79 | HR 61 | Temp 98.0°F | Resp 16 | Ht 67.0 in | Wt 163.2 lb

## 2011-11-22 DIAGNOSIS — L989 Disorder of the skin and subcutaneous tissue, unspecified: Secondary | ICD-10-CM

## 2011-11-22 DIAGNOSIS — F331 Major depressive disorder, recurrent, moderate: Secondary | ICD-10-CM

## 2011-11-22 DIAGNOSIS — F329 Major depressive disorder, single episode, unspecified: Secondary | ICD-10-CM

## 2011-11-22 DIAGNOSIS — F32A Depression, unspecified: Secondary | ICD-10-CM

## 2011-11-22 DIAGNOSIS — I10 Essential (primary) hypertension: Secondary | ICD-10-CM

## 2011-11-22 DIAGNOSIS — K219 Gastro-esophageal reflux disease without esophagitis: Secondary | ICD-10-CM

## 2011-11-22 DIAGNOSIS — E785 Hyperlipidemia, unspecified: Secondary | ICD-10-CM

## 2011-11-22 DIAGNOSIS — R7309 Other abnormal glucose: Secondary | ICD-10-CM

## 2011-11-22 DIAGNOSIS — R739 Hyperglycemia, unspecified: Secondary | ICD-10-CM

## 2011-11-22 LAB — COMPREHENSIVE METABOLIC PANEL
AST: 27 U/L (ref 0–37)
Albumin: 4.8 g/dL (ref 3.5–5.2)
Alkaline Phosphatase: 56 U/L (ref 39–117)
Potassium: 4.5 mEq/L (ref 3.5–5.3)
Sodium: 141 mEq/L (ref 135–145)
Total Protein: 7.9 g/dL (ref 6.0–8.3)

## 2011-11-22 LAB — LIPID PANEL
HDL: 38 mg/dL — ABNORMAL LOW (ref >39)
LDL Cholesterol: 126 mg/dL — ABNORMAL HIGH (ref 0–99)

## 2011-11-22 LAB — POCT GLYCOSYLATED HEMOGLOBIN (HGB A1C): Hemoglobin A1C: 5.8

## 2011-11-22 LAB — GLUCOSE, POCT (MANUAL RESULT ENTRY): POC Glucose: 93 mg/dl (ref 70–99)

## 2011-11-22 NOTE — Progress Notes (Signed)
Subjective:    Patient ID: [name], female    DOB: 10-Jan-1956, 56 y.o.   MRN: [id number]  HPI  Presents for fasting labs and re-evaluation of elevated lipids and glucose.  She continues to have joint and muscle pain.  Dr. Nelva Bush referred her to pain management, which she refused, and felt insulted. She's hopeful that he'll continue to Rx her pain meds, but if not, she'll ask me and/or Dr. Estanislado Pandy.  Review of Systems No chest pain, SOB, HA, dizziness, vision change, N/V, diarrhea, constipation, dysuria, urinary urgency or frequency, or rash. She has a few skin lesions she wants examined.     Objective:   Physical Exam  Vitals reviewed. Constitutional: She is oriented to person, place, and time. Vital signs are normal. She appears well-developed and well-nourished. No distress.  HENT:  Head: Normocephalic and atraumatic.  Right Ear: Hearing, tympanic membrane, external ear and ear canal normal. No foreign bodies.  Left Ear: Hearing, tympanic membrane, external ear and ear canal normal. No foreign bodies.  Nose: Nose normal.  Mouth/Throat: Uvula is midline, oropharynx is clear and moist and mucous membranes are normal. No oral lesions. Normal dentition. No dental abscesses or uvula swelling. No oropharyngeal exudate.  Eyes: Conjunctivae and EOM are normal. Pupils are equal, round, and reactive to light. Right eye exhibits no discharge. Left eye exhibits no discharge. No scleral icterus.  Fundoscopic exam:      The right eye shows no arteriolar narrowing, no AV nicking, no exudate, no hemorrhage and no papilledema. The right eye shows red reflex.The right eye shows no venous pulsations.      The left eye shows no arteriolar narrowing, no AV nicking, no exudate, no hemorrhage and no papilledema. The left eye shows red reflex.The left eye shows no venous pulsations. Neck: Trachea normal, normal range of motion and full passive range of motion without pain. Neck supple. No spinous process  tenderness and no muscular tenderness present. No mass and no thyromegaly present.  Cardiovascular: Normal rate, regular rhythm, normal heart sounds, intact distal pulses and normal pulses.   Pulses:      Radial pulses are 2+ on the right side, and 2+ on the left side.       Dorsalis pedis pulses are 2+ on the right side, and 2+ on the left side.       Posterior tibial pulses are 2+ on the right side, and 2+ on the left side.  Pulmonary/Chest: Effort normal and breath sounds normal. She exhibits no tenderness and no retraction. Right breast exhibits no inverted nipple, no mass, no nipple discharge, no skin change and no tenderness. Left breast exhibits no inverted nipple, no mass, no nipple discharge, no skin change and no tenderness. Breasts are symmetrical.  Abdominal: Soft. Normal appearance and bowel sounds are normal. She exhibits no distension and no mass. There is no hepatosplenomegaly. There is no tenderness. There is no rigidity, no rebound, no guarding, no CVA tenderness, no tenderness at McBurney's point and negative Murphy's sign. No hernia. Hernia confirmed negative in the right inguinal area and confirmed negative in the left inguinal area.  Genitourinary: Rectum normal, vagina normal and uterus normal. Rectal exam shows no external hemorrhoid and no fissure. No breast swelling, tenderness, discharge or bleeding. Pelvic exam was performed with patient supine. No labial fusion. There is no rash, tenderness, lesion or injury on the right labia. There is no rash, tenderness, lesion or injury on the left labia. Cervix exhibits no motion tenderness,  no discharge and no friability. Right adnexum displays no mass, no tenderness and no fullness. Left adnexum displays no mass, no tenderness and no fullness. No erythema, tenderness or bleeding around the vagina. No foreign body around the vagina. No signs of injury around the vagina. No vaginal discharge found.  Musculoskeletal: She exhibits no edema  and no tenderness.       Cervical back: Normal.       Thoracic back: Normal.       Lumbar back: Normal.  Lymphadenopathy:       Head (right side): No tonsillar, no preauricular, no posterior auricular and no occipital adenopathy present.       Head (left side): No tonsillar, no preauricular, no posterior auricular and no occipital adenopathy present.    She has no cervical adenopathy.    She has no axillary adenopathy.       Right: No inguinal and no supraclavicular adenopathy present.       Left: No inguinal and no supraclavicular adenopathy present.  Neurological: She is alert and oriented to person, place, and time. She has normal strength. No cranial nerve deficit or sensory deficit. She exhibits normal muscle tone. Coordination and gait normal.  Skin: Skin is warm, dry and intact. No rash noted. She is not diaphoretic. No cyanosis or erythema. Nails show no clubbing.     Psychiatric: She has a normal mood and affect. Her speech is normal and behavior is normal. Judgment and thought content normal.   Results for orders placed in visit on 11/22/11  GLUCOSE, POCT (MANUAL RESULT ENTRY)      Component Value Range   POC Glucose 93  70 - 99 mg/dl  POCT GLYCOSYLATED HEMOGLOBIN (HGB A1C)      Component Value Range   Hemoglobin A1C 5.8        Assessment & Plan:   1. Hyperglycemia  POCT glucose (manual entry), POCT glycosylated hemoglobin (Hb A1C), Comprehensive metabolic panel  2. Other and unspecified hyperlipidemia  Comprehensive metabolic panel, Lipid panel  3. GERD (gastroesophageal reflux disease)  Continue current treatment  4. HTN (hypertension)  Continue current treatment  5. Depression  Continue current treatment  6. Skin lesion  Schedule excisional biopsy at her convenience

## 2011-11-23 ENCOUNTER — Encounter: Payer: Self-pay | Admitting: Physician Assistant

## 2011-12-07 ENCOUNTER — Ambulatory Visit (INDEPENDENT_AMBULATORY_CARE_PROVIDER_SITE_OTHER): Payer: 59 | Admitting: Surgery

## 2011-12-07 DIAGNOSIS — K6289 Other specified diseases of anus and rectum: Secondary | ICD-10-CM

## 2011-12-07 MED ORDER — AMBULATORY NON FORMULARY MEDICATION: 1.0000 "application " | Freq: Four times a day (QID) | Status: DC

## 2011-12-07 NOTE — Progress Notes (Signed)
[name] comes in today with rather extreme complaints of anal pain and she says radiates down her leg. Total it sounded more like herniated disc pain since the things to provoke provoke as such as housework or picking up sticks should necessarily be related to her hemorrhoids.  On examination he can see no hemorrhoids. She is very tight and has a prominent external sphincter that I can feel and is tender posteriorly and we suspect that she has an anal fissure. However endoscopy was not really visualization visualized because of her inability to comply.  I will try diltiazem cream rectally empirically and see her back in 3 weeks. If this is not making her problem better and we will try exam under anesthesia with lateral internal sphincterotomy.

## 2011-12-07 NOTE — Patient Instructions (Signed)
Use the Diltiezem cream daily as directed.   See Dr. Hassell Done back in the office in 3 weeks.

## 2011-12-12 ENCOUNTER — Other Ambulatory Visit: Payer: Self-pay | Admitting: Physician Assistant

## 2011-12-19 ENCOUNTER — Telehealth (INDEPENDENT_AMBULATORY_CARE_PROVIDER_SITE_OTHER): Payer: Self-pay | Admitting: General Surgery

## 2011-12-19 ENCOUNTER — Encounter (INDEPENDENT_AMBULATORY_CARE_PROVIDER_SITE_OTHER): Payer: Self-pay | Admitting: General Surgery

## 2011-12-19 NOTE — Telephone Encounter (Signed)
Pt calling to report severe rectal pain, despite using the Diliatzem cream.  She was seen by Dr. Hassell Done on 12/07/11 and has follow up appt on 12/22/11 to discuss surgery.  She has received summons for jury duty, but cannot attend because of appt and pain.  Letter written to [employer] for juror # [phone] and Danny Lawless to [phone], attn:  Camera operator.

## 2011-12-20 ENCOUNTER — Ambulatory Visit: Payer: 59 | Admitting: Professional

## 2011-12-22 ENCOUNTER — Encounter (INDEPENDENT_AMBULATORY_CARE_PROVIDER_SITE_OTHER): Payer: Self-pay | Admitting: Surgery

## 2011-12-22 ENCOUNTER — Ambulatory Visit (INDEPENDENT_AMBULATORY_CARE_PROVIDER_SITE_OTHER): Payer: 59 | Admitting: Surgery

## 2011-12-22 VITALS — BP 142/80 | HR 48 | Temp 97.6°F | Resp 16 | Ht 67.0 in | Wt 163.6 lb

## 2011-12-22 DIAGNOSIS — K594 Anal spasm: Secondary | ICD-10-CM

## 2011-12-22 NOTE — Progress Notes (Signed)
Royal comes in today and the cardiezem cream helps.  She wants to proceed with EUA and sphincterotomy.  She understands the risks but wants to proceed.  Plan EUA and lateral internal sphincterotomy

## 2011-12-22 NOTE — Patient Instructions (Signed)
Thanks for your patience.  If you need further assistance after leaving the office, please call our office and speak with Olivia Mackie A.  [phone].  If you want to leave a message for Dr. Hassell Done, please call his office phone at [phone].

## 2011-12-23 ENCOUNTER — Encounter: Payer: Self-pay | Admitting: Physician Assistant

## 2012-01-10 ENCOUNTER — Ambulatory Visit (INDEPENDENT_AMBULATORY_CARE_PROVIDER_SITE_OTHER): Payer: 59 | Admitting: Professional

## 2012-01-10 DIAGNOSIS — F331 Major depressive disorder, recurrent, moderate: Secondary | ICD-10-CM

## 2012-01-17 ENCOUNTER — Ambulatory Visit: Payer: 59 | Admitting: Physician Assistant

## 2012-01-17 ENCOUNTER — Ambulatory Visit (INDEPENDENT_AMBULATORY_CARE_PROVIDER_SITE_OTHER): Payer: 59 | Admitting: Family Medicine

## 2012-01-17 VITALS — BP 121/63 | HR 51 | Temp 98.2°F | Resp 14 | Ht 67.0 in | Wt 161.0 lb

## 2012-01-17 DIAGNOSIS — H6090 Unspecified otitis externa, unspecified ear: Secondary | ICD-10-CM

## 2012-01-17 DIAGNOSIS — H9202 Otalgia, left ear: Secondary | ICD-10-CM

## 2012-01-17 DIAGNOSIS — J0100 Acute maxillary sinusitis, unspecified: Secondary | ICD-10-CM

## 2012-01-17 DIAGNOSIS — H9209 Otalgia, unspecified ear: Secondary | ICD-10-CM

## 2012-01-17 DIAGNOSIS — H60399 Other infective otitis externa, unspecified ear: Secondary | ICD-10-CM

## 2012-01-17 MED ORDER — AMOXICILLIN-POT CLAVULANATE 875-125 MG PO TABS: 1.0000 | ORAL_TABLET | Freq: Two times a day (BID) | ORAL | Status: AC

## 2012-01-17 MED ORDER — OFLOXACIN 0.3 % OT SOLN: 10.0000 [drp] | Freq: Every day | OTIC | Status: AC

## 2012-01-17 NOTE — Progress Notes (Signed)
Subjective:    Patient ID: [name], female    DOB: 1955-07-19, 56 y.o.   MRN: [id number]  HPI This 56 y.o. female presents for evaluation of L ear pain, sinus pain.  Having surgery in one week for anal surgery.  Onset two weeks ago.  Trying to treat with OTC medications and nasal spray; worsens at night with laying on L side.  OTC medications include --- Advil Sinus, rx nasal spray, ear drops OTC which was prescribed by cardiology.  Prescribed ear drop by cardiology in July; improved but has now recurred.  +fever unknown; +chills/sweats.  +L ear pain; +pain with laying on ear.  +HA frontal and sinus region; +sinus pressure.  No sore throat.  No rhinorrhea.  +nasal congestion clear.  +PND at night; no cough.  No vomiting or diarrhea.  History of recurrent sinusitis.  Took Amoxicillin x 2 rounds; just finished round two weeks ago; also prescribed Amoxicillin before that.     Review of Systems  Constitutional: Positive for fever and chills. Negative for diaphoresis and fatigue.  HENT: Positive for ear pain, congestion and postnasal drip. Negative for facial swelling, rhinorrhea, neck pain, neck stiffness and ear discharge.   Eyes: Negative for discharge, redness and itching.  Respiratory: Negative for cough, shortness of breath and wheezing.   Gastrointestinal: Negative for nausea, vomiting and diarrhea.    Past Medical History  Diagnosis Date  . Complication of anesthesia     difficult to wake up  . Shortness of breath   . Angina   . Mental disorder     depression  . Hypertension   . GERD (gastroesophageal reflux disease)   . Arthritis   . Anxiety   . 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 Fibromyalgia   . Depression   . Childhood asthma 05/30/2011  . Rectal mass     Past Surgical History  Procedure Date  . Abdominal hysterectomy   . Cardiac catheterization     normal test per pt  . Shoulder open rotator cuff repair 1999  . Bowel obstruction     Prior to Admission  medications   Medication Sig Start Date End Date Taking? Authorizing Provider  albuterol (PROVENTIL HFA;VENTOLIN HFA) 108 (90 BASE) MCG/ACT inhaler Inhale 2 puffs into the lungs every 4 (four) hours as needed for wheezing. 09/28/93 6/38/75 Yes Beatriz Chancellor, PA-C  AMBULATORY NON FORMULARY MEDICATION Place 1 application rectally 4 (four) times daily. Diltiazem 2% compounded suspension. 12/07/11  Yes Pedro Earls, MD  amitriptyline (ELAVIL) 25 MG tablet Take 12.5 mg by mouth at bedtime as needed. For anxiety 08/30/11  Yes [name] S Jeffery, PA-C  beclomethasone (QVAR) 40 MCG/ACT inhaler Inhale 2 puffs into the lungs 2 (two) times daily as needed. For shortness of breath     Yes Historical Provider, MD  calcipotriene-betamethasone (TACLONEX) ointment Apply 1 application topically daily. For psoriasis 08/30/11  Yes [name] S Jeffery, PA-C  citalopram (CELEXA) 20 MG tablet TAKE 1 TABLET EVERY DAY 12/12/11  Yes [name] S Jeffery, PA-C  cyclobenzaprine (AMRIX) 15 MG 24 hr capsule Take 7.5 mg by mouth daily as needed. For muscle spasms    Yes Historical Provider, MD  DEXILANT 60 MG capsule TAKE 1 CAPSULE BY MOUTH EVERY DAY 11/14/11  Yes Heather M Marte, PA-C  ergocalciferol (VITAMIN D2) 50000 UNITS capsule Take 50,000 Units by mouth once a week. Patient takes weekly dose on Tuesdays     Yes Historical Provider, MD  fenofibrate 160 MG tablet Take 160 mg by mouth daily.  08/25/11  Yes Historical Provider, MD  glycopyrrolate (ROBINUL) 2 MG tablet Take 1 tablet (2 mg total) by mouth daily as needed. For secretions in the stomach 08/30/11  Yes [name] S Jeffery, PA-C  hydrocortisone (ANUSOL-HC) 25 MG suppository Place 25 mg rectally daily as needed. Used at bedtime if needed for hemorrhoids     Yes Historical Provider, MD  ibuprofen (ADVIL,MOTRIN) 800 MG tablet Take 400 mg by mouth every 6 (six) hours as needed.   Yes Historical Provider, MD  NITROSTAT 0.4 MG SL tablet Ad lib. 11/24/11  Yes Historical Provider, MD    NON FORMULARY Apply 1 application topically 3 (three) times daily.   Yes Historical Provider, MD  Omega-3 Fatty Acids (FISH OIL) 1000 MG CAPS Take 1 capsule by mouth daily.     Yes Historical Provider, MD  oxyCODONE-acetaminophen (PERCOCET) 5-325 MG per tablet Take 1 tablet by mouth every 6 (six) hours as needed. For pain     Yes Historical Provider, MD  polyethylene glycol (MIRALAX / GLYCOLAX) packet Take 17 g by mouth daily.   Yes Historical Provider, MD  zolpidem (AMBIEN) 10 MG tablet Take 5 mg by mouth at bedtime as needed.   Yes Historical Provider, MD  amLODipine (NORVASC) 5 MG tablet Take 0.5 tablets (2.5 mg total) by mouth daily. 08/30/11   [name] S Jeffery, PA-C  amoxicillin-clavulanate (AUGMENTIN) 875-125 MG per tablet Take 1 tablet by mouth 2 (two) times daily. 01/17/12 01/27/12  Wardell Honour, MD  metaxalone (SKELAXIN) 800 MG tablet Take 400-800 mg by mouth daily as needed. For muscle spasms     Historical Provider, MD  ofloxacin (FLOXIN OTIC) 0.3 % otic solution Place 10 drops into the left ear daily. 01/17/12 01/27/12  Wardell Honour, MD    Allergies  Allergen Reactions  . Codeine Other (See Comments)    Hallucinations   . Iohexol      Code: HIVES, Desc: hx of hives in the 2006   . Latex Hives and Itching  . Propoxyphene-Acetaminophen   . Sulfonamide Derivatives Itching    History   Social History  . Marital Status: Divorced    Spouse Name: N/A    Number of Children: N/A  . Years of Education: N/A   Occupational History  . Not on file.   Social History Main Topics  . Smoking status: Current Everyday Smoker -- 0.2 packs/day    Types: Cigarettes  . Smokeless tobacco: Not on file  . Alcohol Use: No  . Drug Use: No  . Sexually Active: Yes    Birth Control/ Protection: Post-menopausal   Other Topics Concern  . Not on file   Social History Narrative  . No narrative on file    Family History  Problem Relation Age of Onset  . Cancer Mother     liver  .  Cancer Maternal Grandmother     breast       Objective:   Physical Exam  Nursing note and vitals reviewed. Constitutional: She appears well-developed and well-nourished.  HENT:  Head: Normocephalic and atraumatic.  Right Ear: External ear normal.  Left Ear: External ear normal.  Nose: Nose normal.  Mouth/Throat: Oropharynx is clear and moist. No oropharyngeal exudate.       L EXTERNAL ANATOMY EAR TENDER WITH PALPATION; EXTERNAL EAR CANAL L WITHOUT ERYTHEMA, SWELLING, OR DEBRIS/DRAINAGE.  TM WELL VISUALIZED AND PEARLY GRAY WITH GOOD LIGHT REFLEX.  SINUSES NON-TENDER TO  PALPATION.  Eyes: EOM are normal. Pupils are equal, round, and reactive to light. Right eye exhibits no discharge. Left eye exhibits no discharge. No scleral icterus.  Neck: Normal range of motion. Neck supple.  Cardiovascular: Regular rhythm, normal heart sounds and intact distal pulses.        BRADYCARDIC AT 53.  Pulmonary/Chest: Effort normal and breath sounds normal.  Lymphadenopathy:    She has no cervical adenopathy.  Skin: Skin is warm and dry.  Psychiatric: She has a normal mood and affect. Her behavior is normal. Judgment and thought content normal.          Assessment & Plan:   1. Otalgia of left ear   2. Otitis externa   3. Sinusitis, acute maxillary    New.  Rx for Floxin otic drops to apply to L ear daily for next 7 days.  Rx for Augmentin also provided for next ten days.  Continue to use rx nasal spray; to call for refill; name of nasal spray unknown to patient but to call office with name and refill request.  Continue Advil Cold & Sinus every six hours.  RTC for hearing loss, drainage moderate from L ear, or acute worsening.

## 2012-01-17 NOTE — Patient Instructions (Addendum)
1.  Otalgia of left ear   2.  Otitis externa   3.  Sinusitis, acute maxillary    New. Rx for Floxin otic drops to apply to L ear daily for next 7 days. Rx for Augmentin also provided for next ten days. Continue to use rx nasal spray; to call for refill; name of nasal spray unknown to patient but to call office with name and refill request. Continue Advil Cold & Sinus every six hours. RTC for hearing loss, drainage moderate from L ear, or acute worsening.  Otitis Externa Otitis externa ("swimmer's ear") is a germ (bacterial) or fungal infection of the outer ear canal (from the eardrum to the outside of the ear). Swimming in dirty water may cause swimmer's ear. It also may be caused by moisture in the ear from water remaining after swimming or bathing. Often the first signs of infection may be itching in the ear canal. This may progress to ear canal swelling, redness, and pus drainage, which may be signs of infection. HOME CARE INSTRUCTIONS   Apply the antibiotic drops to the ear canal as prescribed by your doctor.   This can be a very painful medical condition. A strong pain reliever may be prescribed.   Only take over-the-counter or prescription medicines for pain, discomfort, or fever as directed by your caregiver.   If your caregiver has given you a follow-up appointment, it is very important to keep that appointment. Not keeping the appointment could result in a chronic or permanent injury, pain, hearing loss and disability. If there is any problem keeping the appointment, you must call back to this facility for assistance.  PREVENTION   It is important to keep your ear dry. Use the corner of a towel to wick water out of the ear canal after swimming or bathing.   Avoid scratching in your ear. This can damage the ear canal or remove the protective wax lining the canal and make it easier for germs (bacteria) or a fungus to grow.   You may use ear drops made of rubbing alcohol and vinegar after  swimming to prevent future "swimmer's ear" infections. Make up a small bottle of equal parts white vinegar and alcohol. Put 3 or 4 drops into each ear after swimming.   Avoid swimming in lakes, polluted water, or poorly chlorinated pools.  SEEK MEDICAL CARE IF:   An oral temperature above 102 F (38.9 C) develops.   Your ear is still painful after 3 days and shows signs of getting worse (redness, swelling, pain, or pus).  MAKE SURE YOU:   Understand these instructions.   Will watch your condition.   Will get help right away if you are not doing well or get worse.  Document Released: 05/23/2005 Document Revised: 05/12/2011 Document Reviewed: 12/28/2007 [facility] Patient Information 2012 [place].

## 2012-01-27 NOTE — Progress Notes (Signed)
Reviewed and agree.

## 2012-01-30 ENCOUNTER — Other Ambulatory Visit: Payer: Self-pay | Admitting: Family Medicine

## 2012-01-30 ENCOUNTER — Telehealth: Payer: Self-pay

## 2012-01-30 ENCOUNTER — Other Ambulatory Visit: Payer: Self-pay | Admitting: Physician Assistant

## 2012-01-30 DIAGNOSIS — H9209 Otalgia, unspecified ear: Secondary | ICD-10-CM

## 2012-01-30 NOTE — Telephone Encounter (Signed)
Dr Tamala Julian, do you want to refer pt to [facility] ENT/Dr Redmond Baseman as requested?

## 2012-01-30 NOTE — Telephone Encounter (Signed)
Pt is needing to talk with dr Tamala Julian about being referred to [facility] dr bates Best number [phone]

## 2012-01-30 NOTE — Telephone Encounter (Signed)
Spoke with patient.  She described symptoms as: Swelling in neck and face, no sinus pressure, Left earache x 1 month, after 2 rounds of antibiotics

## 2012-01-30 NOTE — Telephone Encounter (Signed)
Call back -- we will be happy to refer to Dr. Johnnette Gourd.

## 2012-01-30 NOTE — Telephone Encounter (Signed)
Call pt back --- please clarify what persistent symptoms she is currently having?  KMS

## 2012-01-31 NOTE — Telephone Encounter (Signed)
LMOM for pt that referral has been started.

## 2012-02-05 DIAGNOSIS — K602 Anal fissure, unspecified: Secondary | ICD-10-CM

## 2012-02-05 HISTORY — DX: Anal fissure, unspecified: K60.2

## 2012-02-09 ENCOUNTER — Ambulatory Visit: Payer: Self-pay | Admitting: Physician Assistant

## 2012-02-10 ENCOUNTER — Telehealth (INDEPENDENT_AMBULATORY_CARE_PROVIDER_SITE_OTHER): Payer: Self-pay | Admitting: General Surgery

## 2012-02-10 NOTE — Telephone Encounter (Signed)
Pt calling for a letter to exempt her from jury duty on 03/05/12.  She is having surgery on 02/17/12 and does not think she can sit on the hard benches to serve.  Her jurist # [id number].  FAX letter to:  Janeece Riggers of [employer], ATTN: jury clerk at [phone].

## 2012-02-13 ENCOUNTER — Telehealth (INDEPENDENT_AMBULATORY_CARE_PROVIDER_SITE_OTHER): Payer: Self-pay | Admitting: General Surgery

## 2012-02-13 NOTE — Telephone Encounter (Signed)
LMOM for pt asking her to return my call.  This call is in regards to me telling her that she will have a PO appt on 10/2 at 3:30 and also that we will not write her a note to excuse her from jury duty until after surgery and we see how she is doing.

## 2012-02-14 ENCOUNTER — Encounter (HOSPITAL_BASED_OUTPATIENT_CLINIC_OR_DEPARTMENT_OTHER): Payer: Self-pay | Admitting: *Deleted

## 2012-02-14 NOTE — Pre-Procedure Instructions (Signed)
To come for [facility] Cardiology note and any testing req. from Dr. Merrilee Jansky office

## 2012-02-14 NOTE — Pre-Procedure Instructions (Signed)
History reviewed by Dr. Albertina Parr; pt. OK to come for surgery

## 2012-02-15 ENCOUNTER — Encounter (HOSPITAL_BASED_OUTPATIENT_CLINIC_OR_DEPARTMENT_OTHER)
Admission: RE | Admit: 2012-02-15 | Discharge: 2012-02-15 | Disposition: A | Discharge status: Home / Self Care | Payer: 59 | Source: Ambulatory Visit | Attending: Surgery | Admitting: Surgery

## 2012-02-15 LAB — BASIC METABOLIC PANEL
GFR calc Af Amer: >90 mL/min (ref >90)
GFR calc non Af Amer: >90 mL/min (ref >90)
Glucose, Bld: 72 mg/dL (ref 70–99)
Potassium: 4.2 mEq/L (ref 3.5–5.1)
Sodium: 140 mEq/L (ref 135–145)

## 2012-02-16 ENCOUNTER — Encounter: Payer: Self-pay | Admitting: Physician Assistant

## 2012-02-16 ENCOUNTER — Ambulatory Visit (INDEPENDENT_AMBULATORY_CARE_PROVIDER_SITE_OTHER): Payer: 59 | Admitting: Physician Assistant

## 2012-02-16 VITALS — BP 124/80 | HR 64 | Temp 97.7°F | Resp 16 | Ht 67.0 in | Wt 162.0 lb

## 2012-02-16 DIAGNOSIS — G4700 Insomnia, unspecified: Secondary | ICD-10-CM

## 2012-02-16 DIAGNOSIS — F411 Generalized anxiety disorder: Secondary | ICD-10-CM

## 2012-02-16 DIAGNOSIS — Z23 Encounter for immunization: Secondary | ICD-10-CM

## 2012-02-16 DIAGNOSIS — L989 Disorder of the skin and subcutaneous tissue, unspecified: Secondary | ICD-10-CM

## 2012-02-16 MED ORDER — ALPRAZOLAM 0.5 MG PO TABS: 0.5000 mg | ORAL_TABLET | Freq: Every evening | ORAL | Status: AC | PRN

## 2012-02-16 NOTE — Patient Instructions (Addendum)
WOUND CARE Please return in 7-10 days to have your stitches/staples removed or sooner if you have concerns. Marland Kitchen Keep area clean and dry for 24 hours. Do not remove bandage, if applied. . After 24 hours, remove bandage and wash wound gently with mild soap and warm water. Reapply a new bandage after cleaning wound, if directed. . Continue daily cleansing with soap and water until stitches/staples are removed. . Do not apply any ointments or creams to the wound while stitches/staples are in place, as this may cause delayed healing. . Notify the office if you experience any of the following signs of infection: Swelling, redness, pus drainage, streaking, fever >101.0 F . Notify the office if you experience excessive bleeding that does not stop after 15-20 minutes of constant, firm pressure.

## 2012-02-16 NOTE — Progress Notes (Signed)
  Subjective:    Patient ID: [name], female    DOB: 1955/07/27, 56 y.o.   MRN: [id number]  HPI This 56 y.o. female presents for excision of a suspicious mole on the posterior aspect of the left calf.  It has grown in size and has developed darker pigmented raised portions centrally.  She will have surgery tomorrow with Kaylyn Lim, MD for an anal mass, which was rescheduled from last week when she had what she thought was a sinus infection but turned out to be a flare of her fibromyalgia in the left TMJ.  She also notes that her anxiety and insomnia have worsened since Dr. Doylene Canard stopped her Elavil, as it could be the cause of her bradycardia.  She already has Ambien, but it is not effective without the Elavil.  She notes she has used Xanax previously with good results.                                                 Review of Systems As above.    Objective:   Physical Exam Blood pressure 124/80, pulse 64, temperature 97.7 F (36.5 C), temperature source Oral, resp. rate 16, height 5' 7"  (1.702 m), weight 162 lb (73.483 kg), SpO2 100.00%. Body mass index is 25.37 kg/(m^2). Well-developed, well nourished BF who is awake, alert and oriented, in NAD. HEENT: [place]/AT, sclera and conjunctiva are clear.  Lungs: normal effort Extremities: no cyanosis, clubbing or edema. Skin: warm and dry without rash. Lesion, 4 mm, as above.  Verbal consent obtained.  Local anesthesia with 4 cc 2% lidocaine plain. Sterile preparation and drape.  Elliptical excision of lesion en toto.  Closed with #3 4-0 ethilon horizontal mattress sutures.  Cleansed and dressed.     Assessment & Plan:   1. Leg skin lesion, left  Dermatology pathology; local wound care.  2. Insomnia  ALPRAZolam (XANAX) 0.5 MG tablet  3. GAD (generalized anxiety disorder)  ALPRAZolam (XANAX) 0.5 MG tablet  4. Need for Tdap vaccination  Tdap vaccine greater than or equal to 7yo IM   She will ask Dr. Doylene Canard if there is another product that  he would feel comfortable with that's non-habit-forming.  She'll get her flu and pneumococcal vaccines at her next visit.

## 2012-02-17 ENCOUNTER — Ambulatory Visit (HOSPITAL_BASED_OUTPATIENT_CLINIC_OR_DEPARTMENT_OTHER)
Admission: RE | Admit: 2012-02-17 | Discharge: 2012-02-17 | Disposition: A | Discharge status: Home / Self Care | Payer: 59 | Source: Ambulatory Visit | Attending: Surgery | Admitting: Surgery

## 2012-02-17 ENCOUNTER — Ambulatory Visit (HOSPITAL_BASED_OUTPATIENT_CLINIC_OR_DEPARTMENT_OTHER): Payer: 59 | Admitting: Anesthesiology

## 2012-02-17 ENCOUNTER — Encounter (HOSPITAL_BASED_OUTPATIENT_CLINIC_OR_DEPARTMENT_OTHER): Payer: Self-pay | Admitting: Anesthesiology

## 2012-02-17 ENCOUNTER — Encounter (HOSPITAL_BASED_OUTPATIENT_CLINIC_OR_DEPARTMENT_OTHER): Payer: Self-pay | Admitting: *Deleted

## 2012-02-17 ENCOUNTER — Encounter (HOSPITAL_BASED_OUTPATIENT_CLINIC_OR_DEPARTMENT_OTHER)
Admission: RE | Disposition: A | Discharge status: Home / Self Care | Payer: Self-pay | Source: Ambulatory Visit | Attending: Surgery

## 2012-02-17 DIAGNOSIS — I10 Essential (primary) hypertension: Secondary | ICD-10-CM | POA: Insufficient documentation

## 2012-02-17 DIAGNOSIS — K6289 Other specified diseases of anus and rectum: Secondary | ICD-10-CM

## 2012-02-17 HISTORY — PX: SPHINCTEROTOMY: SHX5279

## 2012-02-17 HISTORY — PX: EXAMINATION UNDER ANESTHESIA: SHX1540

## 2012-02-17 HISTORY — DX: Tarsal tunnel syndrome, left lower limb: G57.52

## 2012-02-17 HISTORY — DX: Unspecified asthma, uncomplicated: J45.909

## 2012-02-17 HISTORY — DX: Bursopathy, unspecified: M71.9

## 2012-02-17 HISTORY — DX: Irritable bowel syndrome, unspecified: K58.9

## 2012-02-17 HISTORY — DX: Anal fissure, unspecified: K60.2

## 2012-02-17 SURGERY — EXAM UNDER ANESTHESIA
Anesthesia: General | Site: Rectum | Wound class: Dirty or Infected

## 2012-02-17 MED ORDER — DEXTROSE 5 % IV SOLN
2.0000 g | INTRAVENOUS | Status: DC | PRN
  Administered 2012-02-17: 2 g via INTRAVENOUS

## 2012-02-17 MED ORDER — LACTATED RINGERS IV SOLN
INTRAVENOUS | Status: DC
  Administered 2012-02-17 (×2): via INTRAVENOUS

## 2012-02-17 MED ORDER — HEPARIN SODIUM (PORCINE) 5000 UNIT/ML IJ SOLN
5000.0000 [IU] | Freq: Once | INTRAMUSCULAR | Status: AC
  Administered 2012-02-17: 5000 [IU] via SUBCUTANEOUS

## 2012-02-17 MED ORDER — MIDAZOLAM HCL 5 MG/5ML IJ SOLN
INTRAMUSCULAR | Status: DC | PRN
  Administered 2012-02-17: 2 mg via INTRAVENOUS

## 2012-02-17 MED ORDER — PROPOFOL 10 MG/ML IV BOLUS
INTRAVENOUS | Status: DC | PRN
  Administered 2012-02-17: 200 mg via INTRAVENOUS

## 2012-02-17 MED ORDER — OXYCODONE-ACETAMINOPHEN 5-325 MG PO TABS: 1.0000 | ORAL_TABLET | ORAL | Status: DC | PRN

## 2012-02-17 MED ORDER — LIDOCAINE HCL (CARDIAC) 20 MG/ML IV SOLN
INTRAVENOUS | Status: DC | PRN
  Administered 2012-02-17: 50 mg via INTRAVENOUS

## 2012-02-17 MED ORDER — DEXAMETHASONE SODIUM PHOSPHATE 4 MG/ML IJ SOLN
INTRAMUSCULAR | Status: DC | PRN
  Administered 2012-02-17: 10 mg via INTRAVENOUS

## 2012-02-17 MED ORDER — SUCCINYLCHOLINE CHLORIDE 20 MG/ML IJ SOLN
INTRAMUSCULAR | Status: DC | PRN
  Administered 2012-02-17: 100 mg via INTRAVENOUS

## 2012-02-17 MED ORDER — POVIDONE-IODINE 10 % EX OINT
TOPICAL_OINTMENT | CUTANEOUS | Status: DC | PRN
  Administered 2012-02-17: 1 via TOPICAL

## 2012-02-17 MED ORDER — BUPIVACAINE LIPOSOME 1.3 % IJ SUSP
INTRAMUSCULAR | Status: DC | PRN
  Administered 2012-02-17: 20 mL

## 2012-02-17 MED ORDER — METOCLOPRAMIDE HCL 5 MG/ML IJ SOLN
INTRAMUSCULAR | Status: DC | PRN
  Administered 2012-02-17: 10 mg via INTRAVENOUS

## 2012-02-17 MED ORDER — FENTANYL CITRATE 0.05 MG/ML IJ SOLN
INTRAMUSCULAR | Status: DC | PRN
  Administered 2012-02-17: 50 ug via INTRAVENOUS

## 2012-02-17 SURGICAL SUPPLY — 43 items
BLADE SURG 15 STRL LF DISP TIS (BLADE) ×1 IMPLANT
BLADE SURG 15 STRL SS (BLADE) ×2
BRIEF STRETCH FOR OB PAD LRG (UNDERPADS AND DIAPERS) ×1 IMPLANT
CANISTER SUCTION 1200CC (MISCELLANEOUS) ×2 IMPLANT
CLOTH BEACON ORANGE TIMEOUT ST (SAFETY) ×2 IMPLANT
COVER MAYO STAND STRL (DRAPES) IMPLANT
DECANTER SPIKE VIAL GLASS SM (MISCELLANEOUS) ×1 IMPLANT
DRSG PAD ABDOMINAL 8X10 ST (GAUZE/BANDAGES/DRESSINGS) ×2 IMPLANT
ELECT REM PT RETURN 9FT ADLT (ELECTROSURGICAL) ×2
ELECTRODE REM PT RTRN 9FT ADLT (ELECTROSURGICAL) ×1 IMPLANT
GAUZE SPONGE 4X4 12PLY STRL LF (GAUZE/BANDAGES/DRESSINGS) ×3 IMPLANT
GAUZE VASELINE 1X8 (GAUZE/BANDAGES/DRESSINGS) IMPLANT
GLOVE BIO SURGEON STRL SZ8 (GLOVE) ×1 IMPLANT
GLOVE SKINSENSE NS SZ6.5 (GLOVE) ×1
GLOVE SKINSENSE NS SZ8.0 LF (GLOVE) ×1
GLOVE SKINSENSE STRL SZ6.5 (GLOVE) IMPLANT
GLOVE SKINSENSE STRL SZ8.0 LF (GLOVE) IMPLANT
GOWN PREVENTION PLUS XLARGE (GOWN DISPOSABLE) ×2 IMPLANT
GOWN PREVENTION PLUS XXLARGE (GOWN DISPOSABLE) ×2 IMPLANT
IV CATH PLACEMENT UNIT 16 GA (IV SOLUTION) IMPLANT
NDL HYPO 25X1 1.5 SAFETY (NEEDLE) ×1 IMPLANT
NEEDLE HYPO 25X1 1.5 SAFETY (NEEDLE) ×2 IMPLANT
NS IRRIG 1000ML POUR BTL (IV SOLUTION) IMPLANT
PACK BASIN DAY SURGERY FS (CUSTOM PROCEDURE TRAY) ×2 IMPLANT
PACK LITHOTOMY IV (CUSTOM PROCEDURE TRAY) ×2 IMPLANT
PENCIL BUTTON HOLSTER BLD 10FT (ELECTRODE) ×2 IMPLANT
SHEET MEDIUM DRAPE 40X70 STRL (DRAPES) ×2 IMPLANT
SURGILUBE 2OZ TUBE FLIPTOP (MISCELLANEOUS) ×2 IMPLANT
SUT CHROMIC 2 0 SH (SUTURE) IMPLANT
SUT CHROMIC 3 0 SH 27 (SUTURE) IMPLANT
SUT SILK 2 0 TIES 17X18 (SUTURE)
SUT SILK 2-0 18XBRD TIE BLK (SUTURE) IMPLANT
SUT VIC AB 3-0 PS1 18 (SUTURE)
SUT VIC AB 3-0 PS1 18XBRD (SUTURE) IMPLANT
SYR CONTROL 10ML LL (SYRINGE) ×2 IMPLANT
SYRINGE 10CC LL (SYRINGE) IMPLANT
TOWEL OR 17X24 6PK STRL BLUE (TOWEL DISPOSABLE) ×3 IMPLANT
TRAY DSU PREP LF (CUSTOM PROCEDURE TRAY) ×2 IMPLANT
TRAY PROCTOSCOPIC FIBER OPTIC (SET/KITS/TRAYS/PACK) IMPLANT
TUBE CONNECTING 20X1/4 (TUBING) ×2 IMPLANT
UNDERPAD 30X30 INCONTINENT (UNDERPADS AND DIAPERS) ×2 IMPLANT
WATER STERILE IRR 1000ML POUR (IV SOLUTION) ×1 IMPLANT
YANKAUER SUCT BULB TIP NO VENT (SUCTIONS) ×2 IMPLANT

## 2012-02-17 NOTE — Transfer of Care (Signed)
Immediate Anesthesia Transfer of Care Note  Patient: [name]  Procedure(s) Performed: Procedure(s) (LRB) with comments: EXAM UNDER ANESTHESIA (N/A) - Exam under anesthesia with lateral internal sphincterotomy SPHINCTEROTOMY (N/A)  Patient Location: PACU  Anesthesia Type: General  Level of Consciousness: awake  Airway & Oxygen Therapy: Patient Spontanous Breathing and Patient connected to face mask oxygen  Post-op Assessment: Report given to PACU RN and Post -op Vital signs reviewed and stable  Post vital signs: Reviewed and stable  Complications: No apparent anesthesia complications

## 2012-02-17 NOTE — Anesthesia Procedure Notes (Signed)
Procedure Name: Intubation Date/Time: 02/17/2012 11:35 AM Performed by: Melynda Ripple D Pre-anesthesia Checklist: Patient identified, Emergency Drugs available, Suction available and Patient being monitored Patient Re-evaluated:Patient Re-evaluated prior to inductionOxygen Delivery Method: Circle System Utilized Preoxygenation: Pre-oxygenation with 100% oxygen Intubation Type: IV induction, Cricoid Pressure applied and Rapid sequence Grade View: Grade II Tube type: Oral Tube size: 7.0 mm Number of attempts: 1 Airway Equipment and Method: stylet,  oral airway and Video-laryngoscopy Placement Confirmation: ETT inserted through vocal cords under direct vision,  positive ETCO2 and breath sounds checked- equal and bilateral Secured at: 22 cm Tube secured with: Tape Dental Injury: Teeth and Oropharynx as per pre-operative assessment  Difficulty Due To: Difficulty was anticipated and Difficult Airway- due to limited oral opening

## 2012-02-17 NOTE — Anesthesia Preprocedure Evaluation (Signed)
Anesthesia Evaluation  Patient identified by MRN, date of birth, ID band  Reviewed: Allergy & Precautions, H&P , NPO status , Patient's Chart, lab work & pertinent test results  Airway Mallampati: III TM Distance: >3 FB   Mouth opening: Limited Mouth Opening  Dental No notable dental hx. (+) Teeth Intact and Dental Advisory Given   Pulmonary shortness of breath, asthma ,  breath sounds clear to auscultation  Pulmonary exam normal       Cardiovascular hypertension, On Medications + angina Rhythm:Regular Rate:Normal     Neuro/Psych  Headaches, PSYCHIATRIC DISORDERS  Neuromuscular disease    GI/Hepatic Neg liver ROS, GERD-  Poorly Controlled,  Endo/Other  negative endocrine ROS  Renal/GU negative Renal ROS  negative genitourinary   Musculoskeletal   Abdominal   Peds  Hematology negative hematology ROS (+)   Anesthesia Other Findings   Reproductive/Obstetrics negative OB ROS                           Anesthesia Physical Anesthesia Plan  ASA: III  Anesthesia Plan: General   Post-op Pain Management:    Induction: Intravenous, Rapid sequence and Cricoid pressure planned  Airway Management Planned: Oral ETT and Video Laryngoscope Planned  Additional Equipment:   Intra-op Plan:   Post-operative Plan: Extubation in OR  Informed Consent: I have reviewed the patients History and Physical, chart, labs and discussed the procedure including the risks, benefits and alternatives for the proposed anesthesia with the patient or authorized representative who has indicated his/her understanding and acceptance.   Dental advisory given  Plan Discussed with: CRNA  Anesthesia Plan Comments:         Anesthesia Quick Evaluation

## 2012-02-17 NOTE — Progress Notes (Signed)
Pt very disoriented on admission to PACU  Moving all over bed, coughing excessively, bending legs up, groaning and moaning. Pt suctioned mod amount of bloody- tinged drainage by CRNA. Pt finally calmed down when blanket was a little bit over her face .

## 2012-02-17 NOTE — Anesthesia Postprocedure Evaluation (Signed)
  Anesthesia Post-op Note  Patient: [name]  Procedure(s) Performed: Procedure(s) (LRB) with comments: EXAM UNDER ANESTHESIA (N/A) - Exam under anesthesia with lateral internal sphincterotomy SPHINCTEROTOMY (N/A)  Patient Location: PACU  Anesthesia Type: General  Level of Consciousness: awake  Airway and Oxygen Therapy: Patient Spontanous Breathing  Post-op Pain: none  Post-op Assessment: Post-op Vital signs reviewed, Patient's Cardiovascular Status Stable, Respiratory Function Stable, Patent Airway and No signs of Nausea or vomiting  Post-op Vital Signs: Reviewed and stable  Complications: No apparent anesthesia complications

## 2012-02-17 NOTE — Op Note (Signed)
Surgeon: Kaylyn Lim, MD, FACS  Asst:  none  Anes:  general  Procedure: EUA, left lateral internal sphincterotomy  Diagnosis: Anal pain  Complications: none  EBL:   Minimal  cc  Description of Procedure:  Taken to OR 8 at CDS.  General in supine and prepped with PCMX.  Timeout performed.  Digital rectal reveals tight anal sphincter with well demarcated internal and external sphincter.  Small anterior rectocele.  Posterior fissure (chronic) noted.  No significant internal or external hemorrhoids noted.  Distal 1 cm left lateral sphincterotomy performed.  Infiltration with 20 cc Exparel.  To RR the home  Matt B. Hassell Done, [place], [facility], [place]

## 2012-02-17 NOTE — H&P (Signed)
Chief Complaint:  Proctalgia fugax and questionable fissure or hemorrhoids  History of Present Illness:  [name] is an 56 y.o. female has had some pain and bleeding in her anal region.  Exam is very tight and guarded and she is brought now for EUA, possible sphincterotomy and /ior hemorrhoidectomy.  Past Medical History  Diagnosis Date  . Angina   . GERD (gastroesophageal reflux disease)   . Arthritis   . Anxiety   . Fibromyalgia   . Headache     sinus  . Shortness of breath     every day - either at rest or exertion  . Bursitis     right hip  . Anal fissure 02/2012  . Hypertension     under control, has been on med. since 2010  . Tarsal tunnel syndrome of left side   . Complication of anesthesia     difficult to wake up post-op  . Depression   . Asthma     daily and prn inhalers  . Irritable bowel syndrome (IBS)   . Cardiomegaly   . TMJ (temporomandibular joint syndrome)     left  . Dental crowns present   . Wears partial dentures     upper and lower    Past Surgical History  Procedure Date  . Shoulder open rotator cuff repair 1999    left  . Abdominal hysterectomy     partial  . Bilateral oophorectomy 04/2011  . Cystoscopy w/ ureteral stent placement 08/04/2009  . Laparoscopic lysis intestinal adhesions 08/04/2009  . Cardiac catheterization 10/08/2007    Current Facility-Administered Medications  Medication Dose Route Frequency Provider Last Rate Last Dose  . heparin injection 5,000 Units  5,000 Units Subcutaneous Once Pedro Earls, MD   5,000 Units at 02/17/12 1026  . lactated ringers infusion   Intravenous Continuous Fulton Reek, MD 10 mL/hr at 02/17/12 1036     Codeine; Propoxyphene-acetaminophen; Sulfonamide derivatives; Iohexol; and Latex Family History  Problem Relation Age of Onset  . Cancer Mother     liver  . Cancer Maternal Grandmother     breast  . Anesthesia problems Sister     hard to wake up post-op   Social History:   reports  that she has been smoking Cigarettes.  She has a 15 pack-year smoking history. She has never used smokeless tobacco. She reports that she does not drink alcohol or use illicit drugs.   REVIEW OF SYSTEMS - PERTINENT POSITIVES ONLY:  Physical Exam:   Blood pressure 126/75, pulse 57, temperature 98.3 F (36.8 C), temperature source Oral, resp. rate 18, height 5' 7"  (1.702 m), weight 159 lb 6 oz (72.292 kg), SpO2 100.00%. Body mass index is 24.96 kg/(m^2).  Gen:  WDWN AAF NAD  Neurological: Alert and oriented to person, place, and time. Motor and sensory function is grossly intact  Head: Normocephalic and atraumatic.  Eyes: Conjunctivae are normal. Pupils are equal, round, and reactive to light. No scleral icterus.  Neck: Normal range of motion. Neck supple. No tracheal deviation or thyromegaly present.  Cardiovascular:  SR without murmurs or gallops.  No carotid bruits Respiratory: Effort normal.  No respiratory distress. No chest wall tenderness. Breath sounds normal.  No wheezes, rales or rhonchi.  Abdomen:  nontender RD:EYCXKG:  Tight sphincter with guarding.  Ext tags Musculoskeletal: Normal range of motion. Extremities are nontender. No cyanosis, edema or clubbing noted Lymphadenopathy: No cervical, preauricular, postauricular or axillary adenopathy is present Skin: Skin is warm and dry.  No rash noted. No diaphoresis. No erythema. No pallor. Pscyh: Normal mood and affect. Behavior is normal. Judgment and thought content normal.   LABORATORY RESULTS: Results for orders placed during the hospital encounter of 02/17/12 (from the past 48 hour(s))  BASIC METABOLIC PANEL     Status: Normal   Collection Time   02/15/12 12:11 PM      Component Value Range Comment   Sodium 140  135 - 145 mEq/L    Potassium 4.2  3.5 - 5.1 mEq/L    Chloride 104  96 - 112 mEq/L    CO2 28  19 - 32 mEq/L    Glucose, Bld 72  70 - 99 mg/dL    BUN 9  6 - 23 mg/dL    Creatinine, Ser 0.72  0.50 - 1.10 mg/dL     Calcium 9.6  8.4 - 10.5 mg/dL    GFR calc non Af Amer >90  >90 mL/min    GFR calc Af Amer >90  >90 mL/min     RADIOLOGY RESULTS: No results found.  Problem List: Patient Active Problem List  Diagnosis  . LACTOSE INTOLERANCE  . ANXIETY  . DEPRESSION  . EXTERNAL HEMORRHOIDS  . GERD  . DIVERTICULOSIS, COLON  . DIVERTICULITIS, ACUTE  . CONSTIPATION  . FIBROMYALGIA  . OSTEOPOROSIS  . CHEST PAIN  . Abdominal pain, left lower quadrant  . ABDOMINAL PAIN, EPIGASTRIC  . PERSONAL HX COLONIC POLYPS  . Mixed hyperlipidemia  . Childhood asthma  . Anal pain  . Proctalgia fugax    Assessment & Plan: Anal pain  EUA.  Have discussed possible sphincterotomy and hemorrhoidectomy with her.    Matt B. Hassell Done, MD, [facility] [phone] beeper [phone]  02/17/2012 11:22 AM

## 2012-02-20 ENCOUNTER — Encounter (INDEPENDENT_AMBULATORY_CARE_PROVIDER_SITE_OTHER): Payer: Self-pay | Admitting: General Surgery

## 2012-02-21 ENCOUNTER — Encounter (HOSPITAL_BASED_OUTPATIENT_CLINIC_OR_DEPARTMENT_OTHER): Payer: Self-pay | Admitting: Surgery

## 2012-02-21 ENCOUNTER — Encounter: Payer: Self-pay | Admitting: Physician Assistant

## 2012-02-22 ENCOUNTER — Telehealth: Payer: Self-pay

## 2012-02-22 MED ORDER — BENZONATATE 100 MG PO CAPS: 100.0000 mg | ORAL_CAPSULE | Freq: Three times a day (TID) | ORAL | Status: DC | PRN

## 2012-02-22 NOTE — Telephone Encounter (Signed)
The patient called to ask if [name], [facility] could call in a medication for her sinus infection.  The patient has an appointment to see [name], [place] on 02/23/12, but stated she had anal surgery on Wednesday and it would be too uncomfortable to sit and wait to be seen today in the walk in office.  Please call the patient at [phone] to discuss symptoms.

## 2012-02-22 NOTE — Telephone Encounter (Signed)
Advised pt of note. Pt will be in tom to follow up.

## 2012-02-22 NOTE — Telephone Encounter (Signed)
I sent tessalon perles to the pharmacy. She should use Atrovent NS if she still has this, if not I can send in a new rx for it.  Take mucinex and follow up with [facility] tomorrow

## 2012-02-22 NOTE — Telephone Encounter (Signed)
I spoke to patient she is having sinus congestion and cough for past 3days. She had surgery recently and this flared her asthma. She was advised to get Mucinex and I will call her back to advise on further, she has appt tomorrow with [name] but wants something before appt.

## 2012-02-23 ENCOUNTER — Ambulatory Visit (INDEPENDENT_AMBULATORY_CARE_PROVIDER_SITE_OTHER): Payer: 59 | Admitting: Physician Assistant

## 2012-02-23 ENCOUNTER — Encounter: Payer: Self-pay | Admitting: Physician Assistant

## 2012-02-23 VITALS — BP 148/82 | HR 49 | Temp 97.9°F | Resp 16 | Ht 67.0 in | Wt 162.0 lb

## 2012-02-23 DIAGNOSIS — R059 Cough, unspecified: Secondary | ICD-10-CM

## 2012-02-23 DIAGNOSIS — R05 Cough: Secondary | ICD-10-CM

## 2012-02-23 DIAGNOSIS — J329 Chronic sinusitis, unspecified: Secondary | ICD-10-CM

## 2012-02-23 DIAGNOSIS — D2370 Other benign neoplasm of skin of unspecified lower limb, including hip: Secondary | ICD-10-CM

## 2012-02-23 MED ORDER — CEFDINIR 300 MG PO CAPS: 600.0000 mg | ORAL_CAPSULE | Freq: Every day | ORAL | Status: DC

## 2012-02-23 MED ORDER — IPRATROPIUM BROMIDE 0.03 % NA SOLN: 2.0000 | Freq: Two times a day (BID) | NASAL | Status: DC

## 2012-02-23 MED ORDER — HYDROCOD POLST-CHLORPHEN POLST 10-8 MG/5ML PO LQCR: 5.0000 mL | Freq: Two times a day (BID) | ORAL | Status: DC | PRN

## 2012-02-23 NOTE — Progress Notes (Signed)
Subjective:    Patient ID: [name], female    DOB: 1955-07-11, 56 y.o.   MRN: [id number]  HPI This 56 y.o. female presents for suture removal after lesion excision from the back of her left calf.  Pathology revealed dermatofibroma.  She applied some neosporin ointment this morning because it felt sore to her.  Additionally, she has developed increased nasal and sinus congestion, drainage, facial pain and sore throat.  Also cough.  She was given tessalon perles yesterday, but with no relief.  She has a history of frequent sinus infections. She also requests are renewal of her handicapped tag for her car-she is unable to walk 200 feet without stopping to rest due to the pain from her fibromyalgia, tarsal tunnel syndrome, etc.  Review of Systems Rectal tenderness after procedure last week.  General aches and pains.  Otherwise, as above.   Past Medical History  Diagnosis Date  . Angina   . GERD (gastroesophageal reflux disease)   . Arthritis   . Anxiety   . Fibromyalgia   . Headache     sinus  . Shortness of breath     every day - either at rest or exertion  . Bursitis     right hip  . Anal fissure 02/2012  . Hypertension     under control, has been on med. since 2010  . Tarsal tunnel syndrome of left side   . Complication of anesthesia     difficult to wake up post-op  . Depression   . Asthma     daily and prn inhalers  . Irritable bowel syndrome (IBS)   . Cardiomegaly   . TMJ (temporomandibular joint syndrome)     left  . Dental crowns present   . Wears partial dentures     upper and lower    Past Surgical History  Procedure Date  . Shoulder open rotator cuff repair 1999    left  . Abdominal hysterectomy     partial  . Bilateral oophorectomy 04/2011  . Cystoscopy w/ ureteral stent placement 08/04/2009  . Laparoscopic lysis intestinal adhesions 08/04/2009  . Cardiac catheterization 10/08/2007  . Examination under anesthesia 02/17/2012    Procedure: EXAM UNDER  ANESTHESIA;  Surgeon: Pedro Earls, MD;  Location: [place];  Service: General;  Laterality: N/A;  Exam under anesthesia with lateral internal sphincterotomy  . Sphincterotomy 02/17/2012    Procedure: SPHINCTEROTOMY;  Surgeon: Pedro Earls, MD;  Location: [place];  Service: General;  Laterality: N/A;    Prior to Admission medications   Medication Sig Start Date End Date Taking? Authorizing Provider  albuterol (PROVENTIL HFA;VENTOLIN HFA) 108 (90 BASE) MCG/ACT inhaler Inhale 2 puffs into the lungs every 4 (four) hours as needed for wheezing. 09[birth year]01/49 8/84/16 Yes Beatriz Chancellor, PA-C  ALPRAZolam Duanne Moron) 0.5 MG tablet Take 1-2 tablets (0.5-1 mg total) by mouth at bedtime as needed for sleep or anxiety. 02/16/12 03/17/12 Yes Summerlynn Glauser S Rubina Basinski, PA-C  beclomethasone (QVAR) 40 MCG/ACT inhaler Inhale 2 puffs into the lungs 2 (two) times daily as needed. For shortness of breath   Yes Historical Provider, MD  benzonatate (TESSALON) 100 MG capsule Take 1-2 capsules (100-200 mg total) by mouth 3 (three) times daily as needed for cough. 02/22/12  Yes Heather Elnora Morrison, PA-C  calcipotriene-betamethasone (TACLONEX) ointment Apply 1 application topically daily. For psoriasis 08/30/11  Yes Massiah Longanecker S Diantha Paxson, PA-C  citalopram (CELEXA) 20 MG tablet TAKE 1 TABLET EVERY DAY 12/12/11  Yes Carmon Sahli S Aryani Daffern, PA-C  cyclobenzaprine (AMRIX) 15 MG 24 hr capsule Take 5 mg by mouth daily as needed. For muscle spasms    Yes Historical Provider, MD  DEXILANT 60 MG capsule TAKE 1 CAPSULE BY MOUTH EVERY DAY 11/14/11  Yes Heather M Marte, PA-C  dicyclomine (BENTYL) 10 MG capsule Take 10 mg by mouth 4 (four) times daily -  before meals and at bedtime.   Yes Historical Provider, MD  ergocalciferol (VITAMIN D2) 50000 UNITS capsule Take 50,000 Units by mouth once a week. Patient takes weekly dose on Tuesdays     Yes Historical Provider, MD  fenofibrate 160 MG tablet TAKE 1 TABLET DAILY. 01/30/12  Yes  Lucien Budney S Avital Dancy, PA-C  glycopyrrolate (ROBINUL) 2 MG tablet Take 1 tablet (2 mg total) by mouth daily as needed. For secretions in the stomach 08/30/11  Yes Princeston Blizzard S Izaya Netherton, PA-C  ibuprofen (ADVIL,MOTRIN) 800 MG tablet Take 400 mg by mouth every 6 (six) hours as needed.   Yes Historical Provider, MD  ipratropium (ATROVENT) 0.03 % nasal spray Place 2 sprays into the nose every 12 (twelve) hours.   Yes Historical Provider, MD  lisinopril (PRINIVIL,ZESTRIL) 2.5 MG tablet Take 2.5 mg by mouth daily.   Yes Historical Provider, MD  metaxalone (SKELAXIN) 800 MG tablet Take 400-800 mg by mouth daily as needed. For muscle spasms   Yes Historical Provider, MD  Multiple Vitamin (MULTIVITAMIN) tablet Take 1 tablet by mouth daily.   Yes Historical Provider, MD  NITROSTAT 0.4 MG SL tablet Ad lib. 11/24/11  Yes Historical Provider, MD  NON FORMULARY Apply 1 application topically 3 (three) times daily.   Yes Historical Provider, MD  Omega-3 Fatty Acids (FISH OIL) 1000 MG CAPS Take 1 capsule by mouth daily.     Yes Historical Provider, MD  oxyCODONE-acetaminophen (PERCOCET) 5-325 MG per tablet Take 1 tablet by mouth every 8 (eight) hours as needed. For pain    Yes Historical Provider, MD  polyethylene glycol (MIRALAX / GLYCOLAX) packet Take 17 g by mouth daily.   Yes Historical Provider, MD  ranitidine (ZANTAC) 150 MG tablet Take 150 mg by mouth 2 (two) times daily.   Yes Historical Provider, MD  zolpidem (AMBIEN) 10 MG tablet Take 5 mg by mouth at bedtime as needed.   Yes Historical Provider, MD    Allergies  Allergen Reactions  . Codeine Other (See Comments)    HALLUCINATIONS   . Propoxyphene-Acetaminophen Other (See Comments)    HALLUCINATIONS  . Sulfonamide Derivatives Hives, Itching and Swelling  . Iohexol Nausea Only    JITTERY    . Latex Hives    History   Social History  . Marital Status: Divorced    Spouse Name: n/a    Number of Children: 1  . Years of Education: 17   Occupational  History  . Disabled     Fibromyalgia, Depression, Anxiety   Social History Main Topics  . Smoking status: Current Every Day Smoker -- 0.5 packs/day for 30 years    Types: Cigarettes  . Smokeless tobacco: Never Used   Comment: working on quitting.  . Alcohol Use: No  . Drug Use: No  . Sexually Active: Yes    Birth Control/ Protection: Post-menopausal   Other Topics Concern  . Not on file   Social History Narrative  . No narrative on file    Family History  Problem Relation Age of Onset  . Cancer Mother     liver  . Cancer  Maternal Grandmother     breast  . Anesthesia problems Sister     hard to wake up post-op       Objective:   Physical Exam Blood pressure 148/82, pulse 49, temperature 97.9 F (36.6 C), temperature source Oral, resp. rate 16, height 5' 7"  (1.702 m), weight 162 lb (73.483 kg), SpO2 99.00%. Body mass index is 25.37 kg/(m^2). Well-developed, well nourished BF who is awake, alert and oriented, in NAD. HEENT: /AT,sclera and conjunctiva are clear.  EAC are patent, TMs are normal in appearance. Nasal mucosa is very congested, pink and moist. OP is clear. Neck: supple, non-tender, no lymphadenopathy, thyromegaly. Heart: RRR, no murmur Lungs: normal effort, CTA Skin: warm and dry without rash. Surgical site is well healed.  No evidence of infection.  #3 HM sutures removed without difficulty.      Assessment & Plan:   1. Sinusitis  cefdinir (OMNICEF) 300 MG capsule, ipratropium (ATROVENT) 0.03 % nasal spray  2. Cough  chlorpheniramine-HYDROcodone (TUSSIONEX PENNKINETIC ER) 10-8 MG/5ML LQCR  3. Dermatofibroma of calf  Suture removal.  Local wound care.

## 2012-03-07 ENCOUNTER — Encounter (INDEPENDENT_AMBULATORY_CARE_PROVIDER_SITE_OTHER): Payer: Self-pay | Admitting: Surgery

## 2012-03-07 ENCOUNTER — Ambulatory Visit (INDEPENDENT_AMBULATORY_CARE_PROVIDER_SITE_OTHER): Payer: 59 | Admitting: Surgery

## 2012-03-07 VITALS — BP 118/78 | HR 70 | Temp 98.6°F | Resp 18 | Ht 67.0 in | Wt 163.0 lb

## 2012-03-07 DIAGNOSIS — K594 Anal spasm: Secondary | ICD-10-CM

## 2012-03-07 NOTE — Progress Notes (Signed)
[name] 56 y.o.  Body mass index is 25.53 kg/(m^2).  Patient Active Problem List  Diagnosis  . LACTOSE INTOLERANCE  . ANXIETY  . DEPRESSION  . EXTERNAL HEMORRHOIDS  . GERD  . DIVERTICULOSIS, COLON  . DIVERTICULITIS, ACUTE  . CONSTIPATION  . FIBROMYALGIA  . OSTEOPOROSIS  . CHEST PAIN  . Abdominal pain, left lower quadrant  . ABDOMINAL PAIN, EPIGASTRIC  . PERSONAL HX COLONIC POLYPS  . Mixed hyperlipidemia  . Childhood asthma  . Anal pain  . Proctalgia fugax    Allergies  Allergen Reactions  . Codeine Other (See Comments)    HALLUCINATIONS   . Propoxyphene-Acetaminophen Other (See Comments)    HALLUCINATIONS  . Sulfonamide Derivatives Hives, Itching and Swelling  . Iohexol Nausea Only    JITTERY    . Latex Hives    Past Surgical History  Procedure Date  . Shoulder open rotator cuff repair 1999    left  . Abdominal hysterectomy     partial  . Bilateral oophorectomy 04/2011  . Cystoscopy w/ ureteral stent placement 08/04/2009  . Laparoscopic lysis intestinal adhesions 08/04/2009  . Cardiac catheterization 10/08/2007  . Examination under anesthesia 02/17/2012    Procedure: EXAM UNDER ANESTHESIA;  Surgeon: Pedro Earls, MD;  Location: [place];  Service: General;  Laterality: N/A;  Exam under anesthesia with lateral internal sphincterotomy  . Sphincterotomy 02/17/2012    Procedure: SPHINCTEROTOMY;  Surgeon: Pedro Earls, MD;  Location: [place];  Service: General;  Laterality: N/A;   JEFFERY,[name], PA-C No diagnosis found.  Doing well.  Anal exam shows everything has healed up nicely.  No more anal spasm.   Return prn Matt B. Hassell Done, MD, [facility] [phone] beeper [phone]  03/07/2012 2:29 PM

## 2012-03-07 NOTE — Patient Instructions (Addendum)
Avoid constipation Use Tucks (they don't smell that bad) when having problems with itching. Creams are OK also

## 2012-03-27 ENCOUNTER — Ambulatory Visit (INDEPENDENT_AMBULATORY_CARE_PROVIDER_SITE_OTHER): Payer: 59 | Admitting: Professional

## 2012-03-27 DIAGNOSIS — F331 Major depressive disorder, recurrent, moderate: Secondary | ICD-10-CM

## 2012-04-03 ENCOUNTER — Encounter: Payer: Self-pay | Admitting: Physician Assistant

## 2012-04-03 DIAGNOSIS — IMO0001 Reserved for inherently not codable concepts without codable children: Secondary | ICD-10-CM

## 2012-04-17 ENCOUNTER — Ambulatory Visit (INDEPENDENT_AMBULATORY_CARE_PROVIDER_SITE_OTHER): Payer: 59 | Admitting: Professional

## 2012-04-17 DIAGNOSIS — F331 Major depressive disorder, recurrent, moderate: Secondary | ICD-10-CM

## 2012-05-09 ENCOUNTER — Other Ambulatory Visit: Payer: Self-pay | Admitting: Physician Assistant

## 2012-05-11 ENCOUNTER — Other Ambulatory Visit (HOSPITAL_COMMUNITY): Payer: Self-pay | Admitting: Rheumatology

## 2012-05-11 ENCOUNTER — Ambulatory Visit (HOSPITAL_COMMUNITY)
Admission: RE | Admit: 2012-05-11 | Discharge: 2012-05-11 | Disposition: A | Discharge status: Home / Self Care | Payer: 59 | Source: Ambulatory Visit | Attending: Rheumatology | Admitting: Rheumatology

## 2012-05-11 DIAGNOSIS — R52 Pain, unspecified: Secondary | ICD-10-CM

## 2012-05-11 DIAGNOSIS — Z8701 Personal history of pneumonia (recurrent): Secondary | ICD-10-CM | POA: Insufficient documentation

## 2012-05-11 DIAGNOSIS — M549 Dorsalgia, unspecified: Secondary | ICD-10-CM | POA: Insufficient documentation

## 2012-05-15 ENCOUNTER — Ambulatory Visit: Payer: Self-pay | Admitting: Professional

## 2012-05-22 ENCOUNTER — Ambulatory Visit (INDEPENDENT_AMBULATORY_CARE_PROVIDER_SITE_OTHER): Payer: 59 | Admitting: Physician Assistant

## 2012-05-22 ENCOUNTER — Encounter: Payer: Self-pay | Admitting: Physician Assistant

## 2012-05-22 VITALS — BP 145/80 | HR 65 | Temp 98.0°F | Resp 18 | Ht 67.0 in | Wt 159.0 lb

## 2012-05-22 DIAGNOSIS — E782 Mixed hyperlipidemia: Secondary | ICD-10-CM

## 2012-05-22 DIAGNOSIS — R739 Hyperglycemia, unspecified: Secondary | ICD-10-CM

## 2012-05-22 DIAGNOSIS — F411 Generalized anxiety disorder: Secondary | ICD-10-CM

## 2012-05-22 DIAGNOSIS — J329 Chronic sinusitis, unspecified: Secondary | ICD-10-CM

## 2012-05-22 DIAGNOSIS — E119 Type 2 diabetes mellitus without complications: Secondary | ICD-10-CM | POA: Insufficient documentation

## 2012-05-22 DIAGNOSIS — F329 Major depressive disorder, single episode, unspecified: Secondary | ICD-10-CM

## 2012-05-22 DIAGNOSIS — R7309 Other abnormal glucose: Secondary | ICD-10-CM

## 2012-05-22 DIAGNOSIS — J45909 Unspecified asthma, uncomplicated: Secondary | ICD-10-CM

## 2012-05-22 DIAGNOSIS — IMO0001 Reserved for inherently not codable concepts without codable children: Secondary | ICD-10-CM

## 2012-05-22 LAB — COMPREHENSIVE METABOLIC PANEL
ALT: 23 U/L (ref 0–35)
AST: 19 U/L (ref 0–37)
Albumin: 4.7 g/dL (ref 3.5–5.2)
BUN: 11 mg/dL (ref 6–23)
CO2: 26 mEq/L (ref 19–32)
Calcium: 9.8 mg/dL (ref 8.4–10.5)
Chloride: 104 mEq/L (ref 96–112)
Potassium: 4.4 mEq/L (ref 3.5–5.3)

## 2012-05-22 LAB — LIPID PANEL
Cholesterol: 143 mg/dL (ref 0–200)
LDL Cholesterol: 93 mg/dL (ref 0–99)
VLDL: 8 mg/dL (ref 0–40)

## 2012-05-22 MED ORDER — CEFDINIR 300 MG PO CAPS: 600.0000 mg | ORAL_CAPSULE | Freq: Every day | ORAL | Status: DC

## 2012-05-22 MED ORDER — BECLOMETHASONE DIPROPIONATE 40 MCG/ACT IN AERS: 2.0000 | INHALATION_SPRAY | Freq: Two times a day (BID) | RESPIRATORY_TRACT | Status: DC | PRN

## 2012-05-22 NOTE — Progress Notes (Signed)
Subjective:    Patient ID: [name], female    DOB: 12/16/1955, 56 y.o.   MRN: [id number]  HPI Saw cardiologist just before Thanksgiving with chest pain.  Was diagnosed with walking pneumonia and wasn't able to visit family.  "I was bed ridden, and he wanted to put me in the hospital."  Treated with amoxicillin. She is just about recovered, but then several days ago developed sinus pressure and congestion. "think I need something stronger this time." "I can't be stuck at home for Christmas, you'll have to put me in a mental hospital." Dr. Doylene Canard wants magnesium, and potassium.      Past Medical History  Diagnosis Date  . Angina   . GERD (gastroesophageal reflux disease)   . Arthritis   . Anxiety   . Fibromyalgia   . Headache     sinus  . Shortness of breath     every day - either at rest or exertion  . Bursitis     right hip  . Anal fissure 02/2012  . Hypertension     under control, has been on med. since 2010  . Tarsal tunnel syndrome of left side   . Complication of anesthesia     difficult to wake up post-op  . Depression   . Asthma     daily and prn inhalers  . Irritable bowel syndrome (IBS)   . Cardiomegaly   . TMJ (temporomandibular joint syndrome)     left  . Dental crowns present   . Wears partial dentures     upper and lower    Past Surgical History  Procedure Date  . Shoulder open rotator cuff repair 1999    left  . Abdominal hysterectomy     partial  . Bilateral oophorectomy 04/2011  . Cystoscopy w/ ureteral stent placement 08/04/2009  . Laparoscopic lysis intestinal adhesions 08/04/2009  . Cardiac catheterization 10/08/2007  . Examination under anesthesia 02/17/2012    Procedure: EXAM UNDER ANESTHESIA;  Surgeon: Pedro Earls, MD;  Location: [facility];  Service: General;  Laterality: N/A;  Exam under anesthesia with lateral internal sphincterotomy  . Sphincterotomy 02/17/2012    Procedure: SPHINCTEROTOMY;  Surgeon: Pedro Earls, MD;   Location: [facility];  Service: General;  Laterality: N/A;    Prior to Admission medications   Medication Sig Start Date End Date Taking? Authorizing Provider  albuterol (PROVENTIL HFA;VENTOLIN HFA) 108 (90 BASE) MCG/ACT inhaler Inhale 2 puffs into the lungs every 4 (four) hours as needed for wheezing. 7/79/39 0/30/09 Yes Beatriz Chancellor, PA-C  amLODipine (NORVASC) 2.5 MG tablet Take 2.5 mg by mouth daily.   Yes Historical Provider, MD  beclomethasone (QVAR) 40 MCG/ACT inhaler Inhale 2 puffs into the lungs 2 (two) times daily as needed. For shortness of breath   Yes Historical Provider, MD  benzonatate (TESSALON) 100 MG capsule Take 1-2 capsules (100-200 mg total) by mouth 3 (three) times daily as needed for cough. 02/22/12  Yes Heather Elnora Morrison, PA-C  calcipotriene-betamethasone (TACLONEX) ointment Apply 1 application topically daily. For psoriasis 08/30/11  Yes Harly Pipkins S Shannin Naab, PA-C  chlorpheniramine-HYDROcodone (TUSSIONEX PENNKINETIC ER) 10-8 MG/5ML LQCR Take 5 mLs by mouth every 12 (twelve) hours as needed (cough). 02/23/12  Yes Kalasia Crafton S Brecklynn Jian, PA-C  citalopram (CELEXA) 20 MG tablet TAKE 1 TABLET EVERY DAY 12/12/11  Yes Kiya Eno S Reba Hulett, PA-C  cyclobenzaprine (AMRIX) 15 MG 24 hr capsule Take 5 mg by mouth daily as needed. For muscle spasms  Yes Historical Provider, MD  DEXILANT 60 MG capsule TAKE 1 CAPSULE BY MOUTH EVERY DAY 11/14/11  Yes Heather M Marte, PA-C  dicyclomine (BENTYL) 10 MG capsule Take 10 mg by mouth 4 (four) times daily -  before meals and at bedtime.   Yes Historical Provider, MD  fenofibrate 160 MG tablet TAKE 1 TABLET DAILY. 05/09/12  Yes Ryan M Dunn, PA-C  glycopyrrolate (ROBINUL) 2 MG tablet Take 1 tablet (2 mg total) by mouth daily as needed. For secretions in the stomach 08/30/11  Yes Tilla Wilborn S Welden Hausmann, PA-C  ibuprofen (ADVIL,MOTRIN) 800 MG tablet Take 400 mg by mouth every 6 (six) hours as needed.   Yes Historical Provider, MD  ipratropium (ATROVENT) 0.03 %  nasal spray Place 2 sprays into the nose every 12 (twelve) hours. 02/23/12  Yes Brittnae Aschenbrenner S Sebastion Jun, PA-C  metaxalone (SKELAXIN) 800 MG tablet Take 400-800 mg by mouth daily as needed. For muscle spasms   Yes Historical Provider, MD  NITROSTAT 0.4 MG SL tablet Ad lib. 11/24/11  Yes Historical Provider, MD  NON FORMULARY Apply 1 application topically 3 (three) times daily.   Yes Historical Provider, MD  oxyCODONE-acetaminophen (PERCOCET) 5-325 MG per tablet Take 1 tablet by mouth every 8 (eight) hours as needed. For pain    Yes Historical Provider, MD  polyethylene glycol (MIRALAX / GLYCOLAX) packet Take 17 g by mouth daily.   Yes Historical Provider, MD  ranitidine (ZANTAC) 150 MG tablet Take 150 mg by mouth 2 (two) times daily.   Yes Historical Provider, MD  zolpidem (AMBIEN) 10 MG tablet Take 5 mg by mouth at bedtime as needed.   Yes Historical Provider, MD  ergocalciferol (VITAMIN D2) 50000 UNITS capsule Take 50,000 Units by mouth once a week. Patient takes weekly dose on Tuesdays      Historical Provider, MD  lisinopril (PRINIVIL,ZESTRIL) 2.5 MG tablet Take 2.5 mg by mouth daily.    Historical Provider, MD  Multiple Vitamin (MULTIVITAMIN) tablet Take 1 tablet by mouth daily.    Historical Provider, MD  Omega-3 Fatty Acids (FISH OIL) 1000 MG CAPS Take 1 capsule by mouth daily.      Historical Provider, MD    Allergies  Allergen Reactions  . Codeine Other (See Comments)    HALLUCINATIONS   . Propoxyphene-Acetaminophen Other (See Comments)    HALLUCINATIONS  . Sulfonamide Derivatives Hives, Itching and Swelling  . Iohexol Nausea Only    JITTERY    . Latex Hives    History   Social History  . Marital Status: Divorced    Spouse Name: n/a    Number of Children: 1  . Years of Education: 17   Occupational History  . Disabled     Fibromyalgia, Depression, Anxiety   Social History Main Topics  . Smoking status: Current Every Day Smoker -- 0.5 packs/day for 30 years    Types:  Cigarettes  . Smokeless tobacco: Never Used     Comment: working on quitting.  . Alcohol Use: No  . Drug Use: No  . Sexually Active: Yes    Birth Control/ Protection: Post-menopausal   Other Topics Concern  . Not on file   Social History Narrative  . No narrative on file    Family History  Problem Relation Age of Onset  . Cancer Mother     liver  . Cancer Maternal Grandmother     breast  . Anesthesia problems Sister     hard to wake up post-op  Review of Systems As above.    Objective:   Physical Exam Blood pressure 145/80, pulse 65, temperature 98 F (36.7 C), resp. rate 18, height 5' 7"  (1.702 m), weight 159 lb (72.122 kg), SpO2 99.00%. Body mass index is 24.90 kg/(m^2). Well-developed, well nourished BF who is awake, alert and oriented, in NAD. HEENT: [place]/AT, PERRL, EOMI.  Sclera and conjunctiva are clear.  EAC are patent, TMs are normal in appearance. Nasal mucosa is pink and moist. OP is clear. Neck: supple, non-tender, no lymphadenopathy, thyromegaly. Heart: RRR, no murmur Lungs: normal effort, CTA Abdomen: normo-active bowel sounds, supple, non-tender, no mass or organomegaly. Extremities: no cyanosis, clubbing or edema. Skin: warm and dry without rash. Psychologic: good mood and appropriate affect, normal speech and behavior.        Assessment & Plan:   1. Mixed hyperlipidemia  Comprehensive metabolic panel, Lipid panel  2. ANXIETY  Stable; continue current treatment  3. DEPRESSION  Stable; continue current treatment  4. FIBROMYALGIA  Stable; continue current treatment  5. Hyperglycemia  POCT glucose (manual entry), POCT glycosylated hemoglobin (Hb A1C), Magnesium  6. Sinusitis  cefdinir (OMNICEF) 300 MG capsule  7. Asthma - stable beclomethasone (QVAR) 40 MCG/ACT inhaler

## 2012-05-22 NOTE — Patient Instructions (Signed)
QUIT SMOKING!  Try the patch again.  When you feel like you want to smoke because you feel sad or depressed, come see me instead!

## 2012-05-23 ENCOUNTER — Encounter: Payer: Self-pay | Admitting: Physician Assistant

## 2012-06-06 DIAGNOSIS — Z0271 Encounter for disability determination: Secondary | ICD-10-CM

## 2012-06-30 ENCOUNTER — Other Ambulatory Visit: Payer: Self-pay

## 2012-06-30 ENCOUNTER — Encounter (HOSPITAL_COMMUNITY): Payer: Self-pay | Admitting: Emergency Medicine

## 2012-06-30 ENCOUNTER — Emergency Department (HOSPITAL_COMMUNITY): Payer: 59

## 2012-06-30 ENCOUNTER — Observation Stay (HOSPITAL_COMMUNITY)
Admission: EM | Admit: 2012-06-30 | Discharge: 2012-07-02 | Disposition: A | Discharge status: Home / Self Care | Payer: 59 | Attending: Cardiovascular Disease | Admitting: Cardiovascular Disease

## 2012-06-30 DIAGNOSIS — F17200 Nicotine dependence, unspecified, uncomplicated: Secondary | ICD-10-CM | POA: Insufficient documentation

## 2012-06-30 DIAGNOSIS — R059 Cough, unspecified: Principal | ICD-10-CM | POA: Insufficient documentation

## 2012-06-30 DIAGNOSIS — K589 Irritable bowel syndrome without diarrhea: Secondary | ICD-10-CM | POA: Insufficient documentation

## 2012-06-30 DIAGNOSIS — Z79899 Other long term (current) drug therapy: Secondary | ICD-10-CM | POA: Insufficient documentation

## 2012-06-30 DIAGNOSIS — R5381 Other malaise: Secondary | ICD-10-CM | POA: Insufficient documentation

## 2012-06-30 DIAGNOSIS — R509 Fever, unspecified: Secondary | ICD-10-CM | POA: Insufficient documentation

## 2012-06-30 DIAGNOSIS — R5383 Other fatigue: Secondary | ICD-10-CM

## 2012-06-30 DIAGNOSIS — R0602 Shortness of breath: Secondary | ICD-10-CM | POA: Insufficient documentation

## 2012-06-30 DIAGNOSIS — IMO0001 Reserved for inherently not codable concepts without codable children: Secondary | ICD-10-CM | POA: Insufficient documentation

## 2012-06-30 DIAGNOSIS — R05 Cough: Secondary | ICD-10-CM | POA: Insufficient documentation

## 2012-06-30 DIAGNOSIS — K219 Gastro-esophageal reflux disease without esophagitis: Secondary | ICD-10-CM | POA: Insufficient documentation

## 2012-06-30 DIAGNOSIS — F411 Generalized anxiety disorder: Secondary | ICD-10-CM | POA: Insufficient documentation

## 2012-06-30 LAB — CBC
HCT: 38.9 % (ref 36.0–46.0)
Hemoglobin: 13.3 g/dL (ref 12.0–15.0)
MCH: 28.5 pg (ref 26.0–34.0)
MCHC: 34.7 g/dL (ref 30.0–36.0)
Platelets: 359 10*3/uL (ref 150–400)
RBC: 4.73 MIL/uL (ref 3.87–5.11)
RDW: 13.2 % (ref 11.5–15.5)

## 2012-06-30 LAB — COMPREHENSIVE METABOLIC PANEL
ALT: 22 U/L (ref 0–35)
AST: 19 U/L (ref 0–37)
Albumin: 4.1 g/dL (ref 3.5–5.2)
Alkaline Phosphatase: 44 U/L (ref 39–117)
Calcium: 9.6 mg/dL (ref 8.4–10.5)
GFR calc Af Amer: >90 mL/min (ref >90)
Glucose, Bld: 93 mg/dL (ref 70–99)
Potassium: 3.8 mEq/L (ref 3.5–5.1)
Sodium: 139 mEq/L (ref 135–145)
Total Protein: 7.7 g/dL (ref 6.0–8.3)

## 2012-06-30 LAB — CREATININE, SERUM
Creatinine, Ser: 0.68 mg/dL (ref 0.50–1.10)
GFR calc non Af Amer: >90 mL/min (ref >90)

## 2012-06-30 MED ORDER — ZOLPIDEM TARTRATE 5 MG PO TABS: 5.0000 mg | ORAL_TABLET | Freq: Every evening | ORAL | Status: DC | PRN

## 2012-06-30 MED ORDER — DOCUSATE SODIUM 100 MG PO CAPS
100.0000 mg | ORAL_CAPSULE | Freq: Two times a day (BID) | ORAL | Status: DC
  Administered 2012-06-30 – 2012-07-02 (×4): 100 mg via ORAL
  Filled 2012-06-30 (×4): qty 1

## 2012-06-30 MED ORDER — GUAIFENESIN ER 600 MG PO TB12
600.0000 mg | ORAL_TABLET | Freq: Two times a day (BID) | ORAL | Status: DC
  Administered 2012-07-01 (×2): 600 mg via ORAL
  Filled 2012-06-30 (×5): qty 1

## 2012-06-30 MED ORDER — ALPRAZOLAM 0.5 MG PO TABS
0.5000 mg | ORAL_TABLET | Freq: Every day | ORAL | Status: DC
  Administered 2012-06-30 – 2012-07-02 (×3): 0.5 mg via ORAL
  Filled 2012-06-30 (×3): qty 1

## 2012-06-30 MED ORDER — ALUM & MAG HYDROXIDE-SIMETH 200-200-20 MG/5ML PO SUSP
30.0000 mL | Freq: Four times a day (QID) | ORAL | Status: DC | PRN
  Administered 2012-07-02: 30 mL via ORAL
  Filled 2012-06-30: qty 30

## 2012-06-30 MED ORDER — DICYCLOMINE HCL 10 MG PO CAPS
10.0000 mg | ORAL_CAPSULE | Freq: Three times a day (TID) | ORAL | Status: DC
  Administered 2012-06-30 – 2012-07-02 (×7): 10 mg via ORAL
  Filled 2012-06-30 (×10): qty 1

## 2012-06-30 MED ORDER — NITROGLYCERIN 0.4 MG SL SUBL: 0.4000 mg | SUBLINGUAL_TABLET | SUBLINGUAL | Status: DC | PRN

## 2012-06-30 MED ORDER — AMLODIPINE BESYLATE 2.5 MG PO TABS
2.5000 mg | ORAL_TABLET | Freq: Every day | ORAL | Status: DC
  Administered 2012-07-01 – 2012-07-02 (×2): 2.5 mg via ORAL
  Filled 2012-06-30 (×3): qty 1

## 2012-06-30 MED ORDER — ONDANSETRON HCL 4 MG PO TABS: 4.0000 mg | ORAL_TABLET | Freq: Four times a day (QID) | ORAL | Status: DC | PRN

## 2012-06-30 MED ORDER — IPRATROPIUM BROMIDE 0.03 % NA SOLN
1.0000 | Freq: Two times a day (BID) | NASAL | Status: DC
  Filled 2012-06-30: qty 30

## 2012-06-30 MED ORDER — OXYCODONE HCL 5 MG PO TABS
5.0000 mg | ORAL_TABLET | ORAL | Status: DC | PRN
  Administered 2012-06-30 – 2012-07-01 (×2): 5 mg via ORAL
  Filled 2012-06-30 (×2): qty 1

## 2012-06-30 MED ORDER — HEPARIN SODIUM (PORCINE) 5000 UNIT/ML IJ SOLN
5000.0000 [IU] | Freq: Three times a day (TID) | INTRAMUSCULAR | Status: DC
  Administered 2012-06-30 – 2012-07-02 (×5): 5000 [IU] via SUBCUTANEOUS
  Filled 2012-06-30 (×8): qty 1

## 2012-06-30 MED ORDER — IPRATROPIUM BROMIDE 0.06 % NA SOLN
1.0000 | Freq: Two times a day (BID) | NASAL | Status: DC
  Administered 2012-06-30 – 2012-07-01 (×3): 1 via NASAL
  Filled 2012-06-30: qty 15

## 2012-06-30 MED ORDER — CITALOPRAM HYDROBROMIDE 20 MG PO TABS
20.0000 mg | ORAL_TABLET | Freq: Every day | ORAL | Status: DC
  Administered 2012-06-30: 10 mg via ORAL
  Administered 2012-07-01 – 2012-07-02 (×2): 20 mg via ORAL
  Filled 2012-06-30 (×3): qty 1

## 2012-06-30 MED ORDER — FAMOTIDINE 20 MG PO TABS
20.0000 mg | ORAL_TABLET | Freq: Two times a day (BID) | ORAL | Status: DC
  Administered 2012-06-30 – 2012-07-02 (×4): 20 mg via ORAL
  Filled 2012-06-30 (×5): qty 1

## 2012-06-30 MED ORDER — POLYETHYLENE GLYCOL 3350 17 G PO PACK
17.0000 g | PACK | Freq: Every day | ORAL | Status: DC
  Administered 2012-07-02: 17 g via ORAL
  Filled 2012-06-30 (×3): qty 1

## 2012-06-30 MED ORDER — ONDANSETRON HCL 4 MG/2ML IJ SOLN: 4.0000 mg | Freq: Four times a day (QID) | INTRAMUSCULAR | Status: DC | PRN

## 2012-06-30 MED ORDER — ALBUTEROL SULFATE (5 MG/ML) 0.5% IN NEBU
2.5000 mg | INHALATION_SOLUTION | Freq: Four times a day (QID) | RESPIRATORY_TRACT | Status: DC
  Administered 2012-06-30: 2.5 mg via RESPIRATORY_TRACT
  Filled 2012-06-30: qty 0.5

## 2012-06-30 NOTE — ED Notes (Signed)
Pt. Stated, I went to see Dr. Etter Sjogren and said if I was not getting any better to come to ED.  I'm having a fever feeling bad and my chest hurts.

## 2012-06-30 NOTE — ED Provider Notes (Signed)
History     CSN: [id number]  Arrival date & time 06/30/12  1347   None     Chief Complaint  Patient presents with  . Generalized Body Aches    (Consider location/radiation/quality/duration/timing/severity/associated sxs/prior treatment) HPI Comments: 57 y/o F p/w generalized fatigue, congestion, cough, SOB for about 3 months. Intermittently improving and worsening. Has been on amoxicillin, cefdinir, z-pack, and now on amoxicillin again for concern of pna. Subjective fevers at home. States she doesn't believe it's gotten higher than 99. Intermittent sharp chest pain. No clear associations. Center of chest. Non-exertional. Lasts for 2-3 seconds and resolves on own. SOB not particularly worsened or improved by anything. Presents today 2/2 persistent symptoms and cough now productive of gray sputum today.  Patient is a 57 y.o. female presenting with general illness. The history is provided by the patient.  Illness  The current episode started more than 2 weeks ago. The onset was gradual. The problem occurs continuously. The problem has been gradually worsening. The problem is moderate. Nothing relieves the symptoms. Nothing aggravates the symptoms. Associated symptoms include a fever (subjective), abdominal pain (chronic. improved from prio. diffuse), nausea, congestion, rhinorrhea, muscle aches, cough and URI. Pertinent negatives include no orthopnea, no diarrhea, no vomiting, no headaches, no neck pain, no neck stiffness, no rash and no eye pain. She has been eating and drinking normally.    Past Medical History  Diagnosis Date  . Angina   . GERD (gastroesophageal reflux disease)   . Arthritis   . Anxiety   . Fibromyalgia   . Headache     sinus  . Shortness of breath     every day - either at rest or exertion  . Bursitis     right hip  . Anal fissure 02/2012  . Hypertension     under control, has been on med. since 2010  . Tarsal tunnel syndrome of left side   . Complication of  anesthesia     difficult to wake up post-op  . Depression   . Asthma     daily and prn inhalers  . Irritable bowel syndrome (IBS)   . Cardiomegaly   . TMJ (temporomandibular joint syndrome)     left  . Dental crowns present   . Wears partial dentures     upper and lower    Past Surgical History  Procedure Date  . Shoulder open rotator cuff repair 1999    left  . Abdominal hysterectomy     partial  . Bilateral oophorectomy 04/2011  . Cystoscopy w/ ureteral stent placement 08/04/2009  . Laparoscopic lysis intestinal adhesions 08/04/2009  . Cardiac catheterization 10/08/2007  . Examination under anesthesia 02/17/2012    Procedure: EXAM UNDER ANESTHESIA;  Surgeon: Pedro Earls, MD;  Location: [facility];  Service: General;  Laterality: N/A;  Exam under anesthesia with lateral internal sphincterotomy  . Sphincterotomy 02/17/2012    Procedure: SPHINCTEROTOMY;  Surgeon: Pedro Earls, MD;  Location: [facility];  Service: General;  Laterality: N/A;    Family History  Problem Relation Age of Onset  . Cancer Mother     liver  . Cancer Maternal Grandmother     breast  . Anesthesia problems Sister     hard to wake up post-op    History  Substance Use Topics  . Smoking status: Current Every Day Smoker -- 0.5 packs/day for 30 years    Types: Cigarettes  . Smokeless tobacco: Never Used  Comment: working on quitting.  . Alcohol Use: No    OB History    Grav Para Term Preterm Abortions TAB SAB Ect Mult Living                  Review of Systems  Constitutional: Positive for fever (subjective) and chills.  HENT: Positive for congestion and rhinorrhea. Negative for neck pain.   Eyes: Negative for pain and visual disturbance.  Respiratory: Positive for cough and shortness of breath.   Cardiovascular: Positive for chest pain. Negative for orthopnea and leg swelling.  Gastrointestinal: Positive for nausea and abdominal pain (chronic. improved from  prio. diffuse). Negative for vomiting and diarrhea.  Genitourinary: Negative for dysuria, hematuria, flank pain and difficulty urinating.  Musculoskeletal: Positive for back pain (chronic. no change).  Skin: Negative for color change and rash.  Neurological: Negative for dizziness and headaches.  All other systems reviewed and are negative.    Allergies  Codeine; Propoxyphene-acetaminophen; Sulfonamide derivatives; Iohexol; and Latex  Home Medications   Current Outpatient Rx  Name  Route  Sig  Dispense  Refill  . ALBUTEROL SULFATE HFA 108 (90 BASE) MCG/ACT IN AERS   Inhalation   Inhale 2 puffs into the lungs every 4 (four) hours as needed. For wheezing and shortness of breath.         . AMLODIPINE BESYLATE 2.5 MG PO TABS   Oral   Take 2.5 mg by mouth daily.         . AMOXICILLIN 500 MG PO CAPS   Oral   Take 500 mg by mouth 3 (three) times daily.         Marland Kitchen CALCIPOTRIENE-BETAMETH DIPROP 0.005-0.064 % EX OINT   Topical   Apply 1 application topically daily. For psoriasis         . HYDROCOD POLST-CPM POLST ER 10-8 MG/5ML PO LQCR   Oral   Take 5 mLs by mouth every 12 (twelve) hours as needed (cough).   140 mL   0   . CITALOPRAM HYDROBROMIDE 20 MG PO TABS   Oral   Take 20 mg by mouth daily.         . DEXLANSOPRAZOLE 60 MG PO CPDR   Oral   Take 60 mg by mouth daily.         Marland Kitchen DICYCLOMINE HCL 10 MG PO CAPS   Oral   Take 10 mg by mouth 4 (four) times daily -  before meals and at bedtime.         . FENOFIBRATE 160 MG PO TABS      TAKE 1 TABLET DAILY.   90 tablet   0   . GLYCOPYRROLATE 2 MG PO TABS   Oral   Take 2 mg by mouth daily as needed. For secretions in the stomach         . IPRATROPIUM BROMIDE 0.03 % NA SOLN   Nasal   Place 1 spray into the nose every 12 (twelve) hours.         Marland Kitchen ONE-DAILY MULTI VITAMINS PO TABS   Oral   Take 1 tablet by mouth daily.         Marland Kitchen NITROSTAT 0.4 MG SL SUBL   Sublingual   Place 0.4 mg under the tongue  Ad lib. For chest pain.         . NON FORMULARY   Topical   Apply 1 application topically 3 (three) times daily.         Marland Kitchen  FISH OIL 1000 MG PO CAPS   Oral   Take 1 capsule by mouth daily.          . OXYCODONE-ACETAMINOPHEN 5-325 MG PO TABS   Oral   Take 1 tablet by mouth every 8 (eight) hours as needed. For pain         . POLYETHYLENE GLYCOL 3350 PO PACK   Oral   Take 17 g by mouth daily.         Marland Kitchen RANITIDINE HCL 150 MG PO TABS   Oral   Take 150 mg by mouth 2 (two) times daily.         Marland Kitchen ZOLPIDEM TARTRATE 10 MG PO TABS   Oral   Take 5 mg by mouth at bedtime as needed. For sleep.         Marland Kitchen ALPRAZOLAM 0.5 MG PO TABS   Oral   Take 0.5 mg by mouth daily.          . BECLOMETHASONE DIPROPIONATE 40 MCG/ACT IN AERS   Inhalation   Inhale 2 puffs into the lungs 2 (two) times daily as needed. For shortness of breath         . CEFDINIR 300 MG PO CAPS   Oral   Take 2 capsules (600 mg total) by mouth daily.   20 capsule   0   . METAXALONE 800 MG PO TABS   Oral   Take 400-800 mg by mouth daily as needed. For muscle spasms           BP 138/71  Pulse 64  Temp 97.9 F (36.6 C) (Oral)  Resp 14  SpO2 99%  Physical Exam  Nursing note and vitals reviewed. Constitutional: She is oriented to person, place, and time. She appears well-developed and well-nourished. No distress.  HENT:  Head: Normocephalic and atraumatic.  Mouth/Throat: Oropharynx is clear and moist.  Eyes: Conjunctivae normal are normal. Pupils are equal, round, and reactive to light. Right eye exhibits no discharge. Left eye exhibits no discharge.  Neck: Normal range of motion. Neck supple. No tracheal deviation present.  Cardiovascular: Normal heart sounds and intact distal pulses.        Bradycardic to 51  Pulmonary/Chest: Effort normal and breath sounds normal. No stridor. No respiratory distress. She has no wheezes. She has no rales.  Abdominal: Soft. She exhibits no distension. There is  tenderness (minimal diffuse tenderness. distractable). There is no guarding.  Musculoskeletal: She exhibits no edema and no tenderness.  Neurological: She is alert and oriented to person, place, and time.  Skin: Skin is warm and dry.  Psychiatric: She has a normal mood and affect. Her behavior is normal.    ED Course  Procedures (including critical care time)  Labs Reviewed  COMPREHENSIVE METABOLIC PANEL - Abnormal; Notable for the following:    Total Bilirubin 0.2 (*)     All other components within normal limits  CBC  POCT I-STAT TROPONIN I   Dg Chest Port 1 View  06/30/2012  *RADIOLOGY REPORT*  Clinical Data: Body aches.  PORTABLE CHEST - 1 VIEW  Comparison: 05/11/2012  Findings: Mild cardiomegaly noted without edema.  A 6 mm nodular density projects over the left upper lobe, not visible on prior exams.  It may possibly relate to the cardiac lead, but I am not certain of that.  No pleural effusion observed.  IMPRESSION:  1.  Mild cardiomegaly. 2.  Possible 6 mm left upper lobe pulmonary nodule.  Alternatively this might be  due to the adjacent cardiac lead.  Consider follow-up chest radiography in 4-6 weeks time, or chest CT, for further assessment.   Original Report Authenticated By: Van Clines, M.D.      1. Fatigue   2. Cough       MDM    57 y/o F h/o chronic pain and FM p/w general illness for about 3 months. Persistent. Has been on numerous recent abx. HDS, af. Benign exam. Uncertain source of patient's chronic symptoms. CXR and labs to further assess.  PCP requesting admission. No leukocytosis or opacity on CXR to suggest pna. Lytes wnl. Doubt ACS. No hypoxia.  Labs and imaging reviewed by myself and considered in medical decision making if ordered. Imaging interpreted by radiology.   Discussed case with Dr. Lita Mains who is in agreement with assessment and plan.       Bonnita Hollow, MD 06/30/12 [phone]

## 2012-06-30 NOTE — Progress Notes (Signed)
Pt admitted to room 6N01 from the ED.  Requesting food (called ) and pain medicine.  Pt received pain med in the ED so will see how her pain relief is from that.  Oriented to room and dept.

## 2012-06-30 NOTE — ED Provider Notes (Signed)
I saw and evaluated the patient, reviewed the resident's note and I agree with the findings and plan.   Julianne Rice, MD 06/30/12 [phone]

## 2012-06-30 NOTE — H&P (Signed)
[name] is an 57 y.o. female.   Chief Complaint: Cough and shortness of breath HPI: 57 years old female with 1 week history of cough and cold not improving with medications. Patient also has anxiety and fibromyalgia. Chest x-ray is negative for pneumonia  Past Medical History  Diagnosis Date  . Angina   . GERD (gastroesophageal reflux disease)   . Arthritis   . Anxiety   . Fibromyalgia   . Headache     sinus  . Shortness of breath     every day - either at rest or exertion  . Bursitis     right hip  . Anal fissure 02/2012  . Hypertension     under control, has been on med. since 2010  . Tarsal tunnel syndrome of left side   . Complication of anesthesia     difficult to wake up post-op  . Depression   . Asthma     daily and prn inhalers  . Irritable bowel syndrome (IBS)   . Cardiomegaly   . TMJ (temporomandibular joint syndrome)     left  . Dental crowns present   . Wears partial dentures     upper and lower      Past Surgical History  Procedure Date  . Shoulder open rotator cuff repair 1999    left  . Abdominal hysterectomy     partial  . Bilateral oophorectomy 04/2011  . Cystoscopy w/ ureteral stent placement 08/04/2009  . Laparoscopic lysis intestinal adhesions 08/04/2009  . Cardiac catheterization 10/08/2007  . Examination under anesthesia 02/17/2012    Procedure: EXAM UNDER ANESTHESIA;  Surgeon: Pedro Earls, MD;  Location: [place];  Service: General;  Laterality: N/A;  Exam under anesthesia with lateral internal sphincterotomy  . Sphincterotomy 02/17/2012    Procedure: SPHINCTEROTOMY;  Surgeon: Pedro Earls, MD;  Location: [facility];  Service: General;  Laterality: N/A;    Family History  Problem Relation Age of Onset  . Cancer Mother     liver  . Cancer Maternal Grandmother     breast  . Anesthesia problems Sister     hard to wake up post-op   Social History:  reports that she has been smoking Cigarettes.  She has  a 15 pack-year smoking history. She has never used smokeless tobacco. She reports that she does not drink alcohol or use illicit drugs.  Allergies:  Allergies  Allergen Reactions  . Codeine Other (See Comments)    HALLUCINATIONS   . Propoxyphene-Acetaminophen Other (See Comments)    HALLUCINATIONS  . Sulfonamide Derivatives Hives, Itching and Swelling  . Iohexol Nausea Only    JITTERY    . Latex Hives     (Not in a hospital admission)  Results for orders placed during the hospital encounter of 06/30/12 (from the past 48 hour(s))  CBC     Status: Normal   Collection Time   06/30/12  1:56 PM      Component Value Range Comment   WBC 7.4  4.0 - 10.5 K/uL    RBC 4.59  3.87 - 5.11 MIL/uL    Hemoglobin 13.1  12.0 - 15.0 g/dL    HCT 37.8  36.0 - 46.0 %    MCV 82.4  78.0 - 100.0 fL    MCH 28.5  26.0 - 34.0 pg    MCHC 34.7  30.0 - 36.0 g/dL    RDW 13.2  11.5 - 15.5 %    Platelets  359  150 - 400 K/uL   COMPREHENSIVE METABOLIC PANEL     Status: Abnormal   Collection Time   06/30/12  1:56 PM      Component Value Range Comment   Sodium 139  135 - 145 mEq/L    Potassium 3.8  3.5 - 5.1 mEq/L    Chloride 100  96 - 112 mEq/L    CO2 27  19 - 32 mEq/L    Glucose, Bld 93  70 - 99 mg/dL    BUN 11  6 - 23 mg/dL    Creatinine, Ser 0.65  0.50 - 1.10 mg/dL    Calcium 9.6  8.4 - 10.5 mg/dL    Total Protein 7.7  6.0 - 8.3 g/dL    Albumin 4.1  3.5 - 5.2 g/dL    AST 19  0 - 37 U/L    ALT 22  0 - 35 U/L    Alkaline Phosphatase 44  39 - 117 U/L    Total Bilirubin 0.2 (*) 0.3 - 1.2 mg/dL    GFR calc non Af Amer >90  >90 mL/min    GFR calc Af Amer >90  >90 mL/min   POCT I-STAT TROPONIN I     Status: Normal   Collection Time   06/30/12  3:21 PM      Component Value Range Comment   Troponin i, poc 0.00  0.00 - 0.08 ng/mL    Comment 3             Dg Chest Port 1 View  06/30/2012  *RADIOLOGY REPORT*  Clinical Data: Body aches.  PORTABLE CHEST - 1 VIEW  Comparison: 05/11/2012  Findings: Mild  cardiomegaly noted without edema.  A 6 mm nodular density projects over the left upper lobe, not visible on prior exams.  It may possibly relate to the cardiac lead, but I am not certain of that.  No pleural effusion observed.  IMPRESSION:  1.  Mild cardiomegaly. 2.  Possible 6 mm left upper lobe pulmonary nodule.  Alternatively this might be due to the adjacent cardiac lead.  Consider follow-up chest radiography in 4-6 weeks time, or chest CT, for further assessment.   Original Report Authenticated By: Van Clines, M.D.     @ROS @ No weight gain/loss, + asthma, + fibromyalgia, + anxiety, + GI bleed, + Eczema/Psoriasis, + IBS, + Joint pain, No CVA, No Seizures.  Blood pressure 140/65, pulse 67, temperature 97.9 F (36.6 C), temperature source Oral, resp. rate 16, SpO2 99.00%. GENERAL: Well-developed, well-nourished female in no acute distress but emotional.  HEENT: Normocephalic, atraumatic. Brown eyes, Pupils equal, round, reactive to light accommodation. Extraocular movements intact. Oral mucosa is pink and moist.  NECK: Is supple.  RESPIRATORY: Mild wheezing on forced expiration to auscultation bilaterally.  CARDIAC: Regular rate and rhythm.  ABDOMEN: Bowel sounds x 4. Soft, tender on palpation.  EXTREMITIES: No edema, cyanosis or clubbing noted.  NEUROLOGIC: Cranial nerves II-XII intact. Patient is alert, oriented x3.   Assessment/Plan Chronic cough possible acute exacerbation of asthma Anxiety Fibromyalgia IBS  Place in observation/Antibiotic/Breathing treatments  Arzella Rehmann S 06/30/2012, 4:16 PM

## 2012-06-30 NOTE — ED Notes (Signed)
Pt reports she was having SOB, CP, nausea, and weakness on and off since thanksgiving and she went to see her PCP and they prescribed her antibioctis to take. Pt reports she is still taking the antibiotics. Pt now c/o intermittent sharp CP and SOB. Pt reports she has whole body aches d/t her fibromyalgia. Pt reports she has been spitting up grey colored mucous, and cough.

## 2012-06-30 NOTE — ED Notes (Signed)
Pt. Stated, I've been dealing with this since Thanksgiving with antibiotics and all kinds of medications and I'm no better.

## 2012-06-30 NOTE — ED Notes (Signed)
Radiology at bedside

## 2012-06-30 NOTE — ED Notes (Signed)
Pharmacy tech at bedside 

## 2012-07-01 LAB — CBC
Hemoglobin: 12.6 g/dL (ref 12.0–15.0)
RBC: 4.53 MIL/uL (ref 3.87–5.11)
WBC: 6.5 10*3/uL (ref 4.0–10.5)

## 2012-07-01 LAB — BASIC METABOLIC PANEL
GFR calc Af Amer: >90 mL/min (ref >90)
GFR calc non Af Amer: >90 mL/min (ref >90)
Potassium: 3.8 mEq/L (ref 3.5–5.1)
Sodium: 140 mEq/L (ref 135–145)

## 2012-07-01 MED ORDER — DEXTROSE 5 % IV SOLN
1.0000 g | Freq: Every day | INTRAVENOUS | Status: DC
  Administered 2012-07-01 – 2012-07-02 (×2): 1 g via INTRAVENOUS
  Filled 2012-07-01 (×2): qty 10

## 2012-07-01 MED ORDER — DEXTROSE 5 % IV SOLN
250.0000 mg | Freq: Every day | INTRAVENOUS | Status: DC
  Administered 2012-07-01 – 2012-07-02 (×2): 250 mg via INTRAVENOUS
  Filled 2012-07-01 (×2): qty 250

## 2012-07-01 MED ORDER — ALBUTEROL SULFATE (5 MG/ML) 0.5% IN NEBU: 2.5000 mg | INHALATION_SOLUTION | RESPIRATORY_TRACT | Status: DC | PRN

## 2012-07-01 MED ORDER — OXYCODONE-ACETAMINOPHEN 5-325 MG PO TABS
1.0000 | ORAL_TABLET | Freq: Four times a day (QID) | ORAL | Status: DC | PRN
  Administered 2012-07-01 (×2): 1 via ORAL
  Administered 2012-07-02: 2 via ORAL
  Administered 2012-07-02: 1 via ORAL
  Filled 2012-07-01: qty 2
  Filled 2012-07-01 (×4): qty 1

## 2012-07-01 NOTE — Progress Notes (Signed)
Subjective:  Decreased cough and shortness of breath. Afebrile.  Objective:  Vital Signs in the last 24 hours: Temp:  [97.8 F (36.6 C)-98.1 F (36.7 C)] 97.8 F (36.6 C) (01/26 0545) Pulse Rate:  [51-75] 52  (01/26 0545) Cardiac Rhythm:  [-] Sinus bradycardia (01/25 1800) Resp:  [13-18] 17  (01/26 0545) BP: (121-140)/(59-83) 121/59 mmHg (01/26 0545) SpO2:  [99 %-100 %] 100 % (01/26 0545) Weight:  [72.122 kg (159 lb)] 72.122 kg (159 lb) (01/26 0300)  Physical Exam: BP Readings from Last 1 Encounters:  07/01/12 121/59    Wt Readings from Last 1 Encounters:  07/01/12 72.122 kg (159 lb)    Weight change:   HEENT: [place]/AT, Eyes-Brown, PERL, EOMI, Conjunctiva-Pink, Sclera-Non-icteric Neck: No JVD, No bruit, Trachea midline. Lungs:  Clear, Bilateral. Cardiac:  Regular rhythm, normal S1 and S2, no S3.  Abdomen:  Soft, non-tender. Extremities:  No edema present. No cyanosis. No clubbing. CNS: AxOx3, Cranial nerves grossly intact, moves all 4 extremities. Right handed. Skin: Warm and dry.   Intake/Output from previous day:      Lab Results: BMET    Component Value Date/Time   NA 140 07/01/2012 0700   K 3.8 07/01/2012 0700   CL 102 07/01/2012 0700   CO2 27 07/01/2012 0700   GLUCOSE 108* 07/01/2012 0700   BUN 10 07/01/2012 0700   CREATININE 0.64 07/01/2012 0700   CREATININE 0.74 05/22/2012 1121   CALCIUM 9.3 07/01/2012 0700   GFRNONAA >90 07/01/2012 0700   GFRAA >90 07/01/2012 0700   CBC    Component Value Date/Time   WBC 6.5 07/01/2012 0700   WBC 7.3 11/07/2011 1401   RBC 4.53 07/01/2012 0700   RBC 4.90 11/07/2011 1401   HGB 12.6 07/01/2012 0700   HGB 12.8 11/07/2011 1401   HCT 37.7 07/01/2012 0700   HCT 41.2 11/07/2011 1401   PLT 339 07/01/2012 0700   MCV 83.2 07/01/2012 0700   MCV 84.0 11/07/2011 1401   MCH 27.8 07/01/2012 0700   MCH 26.1* 11/07/2011 1401   MCHC 33.4 07/01/2012 0700   MCHC 31.1* 11/07/2011 1401   RDW 13.3 07/01/2012 0700   LYMPHSABS 3.0 11/07/2011 1604   MONOABS 0.6  11/07/2011 1604   EOSABS 0.1 11/07/2011 1604   BASOSABS 0.0 11/07/2011 1604   CARDIAC ENZYMES Lab Results  Component Value Date   CKTOTAL 139 11/20/2007   TROPONINI <0.30 11/07/2011    Scheduled Meds:   . ALPRAZolam  0.5 mg Oral Daily  . amLODipine  2.5 mg Oral Daily  . azithromycin  250 mg Intravenous Daily  . cefTRIAXone (ROCEPHIN)  IV  1 g Intravenous Daily  . citalopram  20 mg Oral Daily  . dicyclomine  10 mg Oral TID AC & HS  . docusate sodium  100 mg Oral BID  . famotidine  20 mg Oral BID  . guaiFENesin  600 mg Oral BID  . heparin  5,000 Units Subcutaneous Q8H  . ipratropium  1 spray Each Nare BID  . polyethylene glycol  17 g Oral Daily   Continuous Infusions:  PRN Meds:.albuterol, alum & mag hydroxide-simeth, nitroGLYCERIN, ondansetron (ZOFRAN) IV, ondansetron, oxyCODONE, zolpidem  Assessment/Plan:  Patient Active Hospital Problem List:  Chronic cough possible acute exacerbation of asthma-improving  Anxiety  Fibromyalgia  IBS  Continue medications. Home in AM.   LOS: 1 day    Dixie Dials  MD  07/01/2012, 10:59 AM

## 2012-07-02 MED ORDER — GUAIFENESIN ER 600 MG PO TB12: 600.0000 mg | ORAL_TABLET | Freq: Two times a day (BID) | ORAL | Status: DC

## 2012-07-02 NOTE — Progress Notes (Signed)
Discharge instructions reviewed with patient. Questions answered. Patient verbalizes understanding. Printed AVS given to patient. Patient to be discharged to home via wheelchair. Awaiting family

## 2012-07-02 NOTE — Discharge Summary (Signed)
Physician Discharge Summary  Patient ID: AIANNA FAHS MRN: [id number] DOB/AGE: Nov 27, 1955 57 y.o.  Admit date: 06/30/2012 Discharge date: 07/02/2012  Admission Diagnoses: Chronic cough possible acute exacerbation of asthma-improving  Anxiety  Fibromyalgia  IBS  Discharge Diagnoses:  Principle Problem:  * Chronic cough, possible acute exacerbation of asthma*  Anxiety  Fibromyalgia  IBS   Discharged Condition: good  Hospital Course: 57 years old female was placed in observation with 1 week history of cough and cold not improving with medications. Patient also has anxiety and fibromyalgia. Her condition improved with albuterol inhalation and antibiotic. She was discharged in stable condition. She will have OP chest x-ray or CT chest for her possible left upper lung pulmonary nodule.   Consults: None  Significant Diagnostic Studies: labs: CBC and BMET are in normal range.   Chest x-ray: 1. Mild cardiomegaly.                       2. Possible 6 mm left upper lobe pulmonary nodule. Alternatively this might be due to the adjacent cardiac lead. Consider follow-up chest radiography in 4-6 weeks time, or chest CT, for further assessment   Treatments: antibiotics: ceftriaxone and azithromycin  Discharge Exam: Blood pressure 121/89, pulse 51, temperature 97.7 F (36.5 C), temperature source Oral, resp. rate 18, height 5' 2"  (1.575 m), weight 72.122 kg (159 lb), SpO2 100.00%. HEENT: [place]/AT, Eyes-Brown, PERL, EOMI, Conjunctiva-Pink, Sclera-Non-icteric  Neck: No JVD, No bruit, Trachea midline.  Lungs: Clear, Bilateral.  Cardiac: Regular rhythm, normal S1 and S2, no S3.  Abdomen: Soft, non-tender.  Extremities: No edema present. No cyanosis. No clubbing.  CNS: AxOx3, Cranial nerves grossly intact, moves all 4 extremities. Right handed.  Skin: Warm and dry.   Disposition: 01-Home or Self Care  Discharge Orders    Future Appointments: Provider: Department: Dept Phone: Center:   09/25/2012 10:00 AM Fara Chute, PA-C [place] [phone] [facility]       Medication List     As of 07/02/2012  8:40 AM    STOP taking these medications         cefdinir 300 MG capsule   Commonly known as: OMNICEF      TAKE these medications         albuterol 108 (90 BASE) MCG/ACT inhaler   Commonly known as: PROVENTIL HFA;VENTOLIN HFA   Inhale 2 puffs into the lungs every 4 (four) hours as needed. For wheezing and shortness of breath.      ALPRAZolam 0.5 MG tablet   Commonly known as: XANAX   Take 0.5 mg by mouth daily.      amLODipine 2.5 MG tablet   Commonly known as: NORVASC   Take 2.5 mg by mouth daily.      amoxicillin 500 MG capsule   Commonly known as: AMOXIL   Take 500 mg by mouth 3 (three) times daily.      beclomethasone 40 MCG/ACT inhaler   Commonly known as: QVAR   Inhale 2 puffs into the lungs 2 (two) times daily as needed. For shortness of breath      calcipotriene-betamethasone ointment   Commonly known as: TACLONEX   Apply 1 application topically daily. For psoriasis      chlorpheniramine-HYDROcodone 10-8 MG/5ML Lqcr   Commonly known as: TUSSIONEX   Take 5 mLs by mouth every 12 (twelve) hours as needed (cough).      citalopram 20 MG tablet   Commonly known as: CELEXA  Take 20 mg by mouth daily.      DEXILANT 60 MG capsule   Generic drug: dexlansoprazole   Take 60 mg by mouth daily.      dicyclomine 10 MG capsule   Commonly known as: BENTYL   Take 10 mg by mouth 4 (four) times daily -  before meals and at bedtime.      fenofibrate 160 MG tablet   TAKE 1 TABLET DAILY.      Fish Oil 1000 MG Caps   Take 1 capsule by mouth daily.      glycopyrrolate 2 MG tablet   Commonly known as: ROBINUL   Take 2 mg by mouth daily as needed. For secretions in the stomach      guaiFENesin 600 MG 12 hr tablet   Commonly known as: MUCINEX   Take 1 tablet (600 mg total) by mouth 2 (two) times daily.      ipratropium 0.03 % nasal spray     Commonly known as: ATROVENT   Place 1 spray into the nose every 12 (twelve) hours.      metaxalone 800 MG tablet   Commonly known as: SKELAXIN   Take 400-800 mg by mouth daily as needed. For muscle spasms      multivitamin tablet   Take 1 tablet by mouth daily.      NITROSTAT 0.4 MG SL tablet   Generic drug: nitroGLYCERIN   Place 0.4 mg under the tongue Ad lib. For chest pain.      NON FORMULARY   Apply 1 application topically 3 (three) times daily.      oxyCODONE-acetaminophen 5-325 MG per tablet   Commonly known as: PERCOCET/ROXICET   Take 1 tablet by mouth every 8 (eight) hours as needed. For pain      polyethylene glycol packet   Commonly known as: MIRALAX / GLYCOLAX   Take 17 g by mouth daily.      ranitidine 150 MG tablet   Commonly known as: ZANTAC   Take 150 mg by mouth 2 (two) times daily.      zolpidem 10 MG tablet   Commonly known as: AMBIEN   Take 5 mg by mouth at bedtime as needed. For sleep.         SignedDixie Dials S 07/02/2012, 8:40 AM

## 2012-07-09 ENCOUNTER — Other Ambulatory Visit: Payer: Self-pay | Admitting: Physician Assistant

## 2012-07-21 ENCOUNTER — Other Ambulatory Visit: Payer: Self-pay

## 2012-07-27 DIAGNOSIS — Z0271 Encounter for disability determination: Secondary | ICD-10-CM

## 2012-07-31 ENCOUNTER — Ambulatory Visit (INDEPENDENT_AMBULATORY_CARE_PROVIDER_SITE_OTHER): Payer: 59 | Admitting: Professional

## 2012-07-31 DIAGNOSIS — F331 Major depressive disorder, recurrent, moderate: Secondary | ICD-10-CM

## 2012-08-13 ENCOUNTER — Ambulatory Visit: Payer: 59 | Admitting: Professional

## 2012-08-15 ENCOUNTER — Ambulatory Visit (INDEPENDENT_AMBULATORY_CARE_PROVIDER_SITE_OTHER): Payer: 59 | Admitting: Family Medicine

## 2012-08-15 VITALS — BP 138/64 | HR 57 | Temp 97.8°F | Resp 16 | Ht 68.0 in | Wt 158.8 lb

## 2012-08-15 DIAGNOSIS — J069 Acute upper respiratory infection, unspecified: Secondary | ICD-10-CM

## 2012-08-15 DIAGNOSIS — J019 Acute sinusitis, unspecified: Secondary | ICD-10-CM

## 2012-08-15 DIAGNOSIS — J029 Acute pharyngitis, unspecified: Secondary | ICD-10-CM

## 2012-08-15 MED ORDER — CEFDINIR 300 MG PO CAPS: 300.0000 mg | ORAL_CAPSULE | Freq: Two times a day (BID) | ORAL | Status: DC

## 2012-08-15 MED ORDER — FLUTICASONE PROPIONATE 50 MCG/ACT NA SUSP: 2.0000 | Freq: Every day | NASAL | Status: DC

## 2012-08-15 NOTE — Patient Instructions (Signed)
Drink lots of fluids  Use the nose spray 2 sprays each nostril twice daily for 3 days, then once daily  Cefdinir twice daily for infection

## 2012-08-15 NOTE — Progress Notes (Signed)
Subjective: 57 year old lady who is on disability for multiple health issues. She has been getting sick for the last 5 days. She's had upper sparked or congestion with pressure and pain in her maxillary and frontal sinus region. Her throat is sore. She brought up a big glob of bloody phlegm. She has not been running any fever. She did take some medicines, and antibiotic of her friends. This did not seem to help. She quit smoking 3 months ago finally. She was told that she had a little spot on her lungs, and has a chest CT scan scheduled for sometime in a few weeks to followup on that. She is very fearful of getting another pneumonia since she just recently had a pneumonia. She has had some hurting in her left lateral chest wall.  Objective: Somewhat ill-appearing lady. Her TMs are normal. Nose congested. Some tenderness of the sinuses. Throat clear and not erythematous. Despite its pain it looked pretty good. Neck supple without significant nodes. Chest is clear to auscultation with no rhonchi rales or wheezes. Heart regular without murmurs.  Assessment: URI Probable sinusitis with purulent drainage Pharyngitis, nonspecific  History of recent pneumonia  Plan: Will go ahead and give her a round of Cefdinir. This will be acute give good coverage for for her sinuses. The patient is convinced that amoxicillin and Z-Paks did not work for her, though I attempted to explain bacterial versus viral treatments.

## 2012-08-27 ENCOUNTER — Other Ambulatory Visit: Payer: Self-pay | Admitting: Cardiovascular Disease

## 2012-08-27 DIAGNOSIS — R9389 Abnormal findings on diagnostic imaging of other specified body structures: Secondary | ICD-10-CM

## 2012-08-28 ENCOUNTER — Other Ambulatory Visit: Payer: Self-pay | Admitting: Physician Assistant

## 2012-09-11 ENCOUNTER — Ambulatory Visit (INDEPENDENT_AMBULATORY_CARE_PROVIDER_SITE_OTHER): Payer: 59 | Admitting: Professional

## 2012-09-11 DIAGNOSIS — F331 Major depressive disorder, recurrent, moderate: Secondary | ICD-10-CM

## 2012-09-12 ENCOUNTER — Encounter: Payer: Self-pay | Admitting: Physician Assistant

## 2012-09-12 DIAGNOSIS — IMO0001 Reserved for inherently not codable concepts without codable children: Secondary | ICD-10-CM

## 2012-09-25 ENCOUNTER — Ambulatory Visit (INDEPENDENT_AMBULATORY_CARE_PROVIDER_SITE_OTHER): Payer: 59 | Admitting: Physician Assistant

## 2012-09-25 ENCOUNTER — Encounter: Payer: Self-pay | Admitting: Physician Assistant

## 2012-09-25 VITALS — BP 136/71 | HR 51 | Temp 97.9°F | Resp 16 | Ht 68.0 in | Wt 156.0 lb

## 2012-09-25 DIAGNOSIS — G4700 Insomnia, unspecified: Secondary | ICD-10-CM

## 2012-09-25 DIAGNOSIS — F5104 Psychophysiologic insomnia: Secondary | ICD-10-CM

## 2012-09-25 DIAGNOSIS — R5381 Other malaise: Secondary | ICD-10-CM

## 2012-09-25 DIAGNOSIS — F329 Major depressive disorder, single episode, unspecified: Secondary | ICD-10-CM

## 2012-09-25 DIAGNOSIS — E785 Hyperlipidemia, unspecified: Secondary | ICD-10-CM

## 2012-09-25 DIAGNOSIS — I10 Essential (primary) hypertension: Secondary | ICD-10-CM

## 2012-09-25 DIAGNOSIS — R5383 Other fatigue: Secondary | ICD-10-CM

## 2012-09-25 DIAGNOSIS — E1169 Type 2 diabetes mellitus with other specified complication: Secondary | ICD-10-CM | POA: Insufficient documentation

## 2012-09-25 DIAGNOSIS — F411 Generalized anxiety disorder: Secondary | ICD-10-CM

## 2012-09-25 LAB — POCT CBC
Hemoglobin: 12.3 g/dL (ref 12.2–16.2)
Lymph, poc: 2.5 (ref 0.6–3.4)
MCH, POC: 26.3 pg — AB (ref 27–31.2)
MCHC: 30.8 g/dL — AB (ref 31.8–35.4)
MID (cbc): 0.8 (ref 0–0.9)
MPV: 10.4 fL (ref 0–99.8)
POC LYMPH PERCENT: 37.4 %L (ref 10–50)
POC MID %: 11.7 %M (ref 0–12)
WBC: 6.8 10*3/uL (ref 4.6–10.2)

## 2012-09-25 LAB — LIPID PANEL
HDL: 40 mg/dL (ref >39)
LDL Cholesterol: 95 mg/dL (ref 0–99)
Triglycerides: 43 mg/dL (ref <150)

## 2012-09-25 LAB — COMPREHENSIVE METABOLIC PANEL
Albumin: 4.4 g/dL (ref 3.5–5.2)
Alkaline Phosphatase: 35 U/L — ABNORMAL LOW (ref 39–117)
BUN: 9 mg/dL (ref 6–23)
Calcium: 9.8 mg/dL (ref 8.4–10.5)
Glucose, Bld: 90 mg/dL (ref 70–99)
Potassium: 4.3 mEq/L (ref 3.5–5.3)

## 2012-09-25 MED ORDER — CITALOPRAM HYDROBROMIDE 20 MG PO TABS: 20.0000 mg | ORAL_TABLET | Freq: Every day | ORAL | Status: DC

## 2012-09-25 MED ORDER — AMLODIPINE BESYLATE 5 MG PO TABS: 2.5000 mg | ORAL_TABLET | Freq: Every day | ORAL | Status: DC

## 2012-09-25 MED ORDER — FENOFIBRATE 160 MG PO TABS: 160.0000 mg | ORAL_TABLET | Freq: Every day | ORAL | Status: DC

## 2012-09-25 MED ORDER — ALPRAZOLAM 1 MG PO TABS: 0.5000 mg | ORAL_TABLET | Freq: Every evening | ORAL | Status: DC | PRN

## 2012-09-25 NOTE — Progress Notes (Signed)
Subjective:    Patient ID: [name], female    DOB: 04-29-1956, 57 y.o.   MRN: [id number]  HPI This 57 y.o. female presents for evaluation of several chronic problems, including hyperlipidemia, hyperglycemia, GERD, and for evaluation of feeling poorly x 4 weeks.  Current allergies and medications reviewed.  Past medical, social and family histories reviewed.     Review of Systems  Constitutional: Positive for fatigue (she's concerned that she's anemic, that her glucose is "off."). Negative for fever, chills, appetite change and unexpected weight change.  HENT: Negative for ear pain, congestion, rhinorrhea, sneezing, postnasal drip and tinnitus.        She notes her allergies have been MUCH better since she quite smoking in 05/2012.  She has been seen for sinusitis only once since then.  Eyes: Negative for photophobia, pain, discharge, redness, itching and visual disturbance.  Respiratory: Negative for cough, chest tightness, shortness of breath and wheezing.        CT of the chest has been rescheduled for 10/05/2012.  Cardiovascular: Negative.   Gastrointestinal: Positive for constipation (chronic, but controlled with fiber and fluids) and abdominal distention. Negative for nausea and diarrhea.       Constipation and bloating are much improved with an OTC supplement.  Endocrine: Negative.   Genitourinary: Negative.   Musculoskeletal: Positive for myalgias and arthralgias. Negative for joint swelling.       Fibromyalgia pain is baseline. She's to follow-up with orthopedics for a LEFT foot injection on 4/30.  Allergic/Immunologic: Positive for environmental allergies. Negative for food allergies and immunocompromised state.  Neurological: Negative.   Hematological: Negative.   Psychiatric/Behavioral: Positive for sleep disturbance (Ambien, even at 15 mg, doesn't keep her asleep, and she's waking frequently.  When she takes 1 mg of alprazolam, she sleeps well, and asks to  increase her  dose.). Negative for suicidal ideas, hallucinations, confusion, self-injury, dysphoric mood, decreased concentration and agitation. The patient is not nervous/anxious.        Earlier this month she missed 4 doses of citalopram and "The Devil got in me." She  describes being "homicidal." She got into an argument over money with a longtime friend who refused to pay back a loan.  Fortunately, she contacted her daughter, her therapist, and a church elder, who were able to identify that she'd missed the doses, encouraged her to restart the medication, and were able to help her be rational and not cause harm to anyone. She has since had a vision, which has resulted in her feeling the [facility] very profoundly and she has increased the time she spends in prayer.       Objective:   Physical Exam  Vitals reviewed. Constitutional: She is oriented to person, place, and time. Vital signs are normal. She appears well-developed and well-nourished. No distress.  HENT:  Head: Normocephalic and atraumatic.  Right Ear: Hearing normal.  Left Ear: Hearing normal.  Eyes: EOM are normal. Pupils are equal, round, and reactive to light.  Neck: Normal range of motion. Neck supple. No thyromegaly present.  Cardiovascular: Normal rate, regular rhythm and normal heart sounds.   Pulses:      Radial pulses are 2+ on the right side, and 2+ on the left side.       Dorsalis pedis pulses are 2+ on the right side, and 2+ on the left side.       Posterior tibial pulses are 2+ on the right side, and 2+ on the left side.  Pulmonary/Chest: Effort normal and breath sounds normal.  Lymphadenopathy:       Head (right side): No tonsillar, no preauricular, no posterior auricular and no occipital adenopathy present.       Head (left side): No tonsillar, no preauricular, no posterior auricular and no occipital adenopathy present.    She has no cervical adenopathy.       Right: No supraclavicular adenopathy present.       Left: No  supraclavicular adenopathy present.  Neurological: She is alert and oriented to person, place, and time. No sensory deficit.  Skin: Skin is warm, dry and intact. No rash noted. No cyanosis or erythema. Nails show no clubbing.  Psychiatric: She has a normal mood and affect.    Results for orders placed in visit on 09/25/12  POCT CBC      Result Value Range   WBC 6.8  4.6 - 10.2 K/uL   Lymph, poc 2.5  0.6 - 3.4   POC LYMPH PERCENT 37.4  10 - 50 %L   MID (cbc) 0.8  0 - 0.9   POC MID % 11.7  0 - 12 %M   POC Granulocyte 3.5  2 - 6.9   Granulocyte percent 50.9  37 - 80 %G   RBC 4.67  4.04 - 5.48 M/uL   Hemoglobin 12.3  12.2 - 16.2 g/dL   HCT, POC 39.9  37.7 - 47.9 %   MCV 85.5  80 - 97 fL   MCH, POC 26.3 (*) 27 - 31.2 pg   MCHC 30.8 (*) 31.8 - 35.4 g/dL   RDW, POC 14.3     Platelet Count, POC 384  142 - 424 K/uL   MPV 10.4  0 - 99.8 fL  GLUCOSE, POCT (MANUAL RESULT ENTRY)      Result Value Range   POC Glucose 94  70 - 99 mg/dl       Assessment & Plan:  Chronic insomnia - Plan: ALPRAZolam (XANAX) 1 MG tablet (INCREASED from 0.5 mg), reduce Ambien to 10 mg QHS PRN.  Malaise and fatigue - Plan: POCT CBC, POCT glucose (manual entry), TSH, Comprehensive metabolic panel  HTN (hypertension) - Plan: POCT CBC, TSH, Comprehensive metabolic panel, amLODipine (NORVASC) 5 MG tablet,   Hyperlipidemia - Plan: fenofibrate 160 MG tablet, Lipid panel  ANXIETY/DEPRESSION - Plan: citalopram (CELEXA) 20 MG tablet, ALPRAZolam (XANAX) 1 MG tablet  Patient Instructions  I will contact you with your lab results as soon as they are available.   If you have not heard from me in 2 weeks, please contact me.  The fastest way to get your results is to register for My Chart (see the instructions on the last page of this printout).     RTC 4 months, sooner PRN. Fara Chute, PA-C Physician Assistant-Certified [facility]

## 2012-09-25 NOTE — Patient Instructions (Signed)
I will contact you with your lab results as soon as they are available.   If you have not heard from me in 2 weeks, please contact me.  The fastest way to get your results is to register for My Chart (see the instructions on the last page of this printout).

## 2012-09-29 ENCOUNTER — Other Ambulatory Visit: Payer: Self-pay | Admitting: Family Medicine

## 2012-10-02 ENCOUNTER — Encounter: Payer: Self-pay | Admitting: Physician Assistant

## 2012-10-05 ENCOUNTER — Encounter: Payer: Self-pay | Admitting: Physician Assistant

## 2012-10-05 ENCOUNTER — Ambulatory Visit
Admission: RE | Admit: 2012-10-05 | Discharge: 2012-10-05 | Disposition: A | Discharge status: Home / Self Care | Payer: Medicare Other | Source: Ambulatory Visit | Attending: Cardiovascular Disease | Admitting: Cardiovascular Disease

## 2012-10-05 DIAGNOSIS — R9389 Abnormal findings on diagnostic imaging of other specified body structures: Secondary | ICD-10-CM

## 2012-10-09 ENCOUNTER — Ambulatory Visit (INDEPENDENT_AMBULATORY_CARE_PROVIDER_SITE_OTHER): Payer: 59 | Admitting: Professional

## 2012-10-09 DIAGNOSIS — F331 Major depressive disorder, recurrent, moderate: Secondary | ICD-10-CM

## 2012-10-18 ENCOUNTER — Encounter: Payer: Self-pay | Admitting: Physician Assistant

## 2012-10-23 ENCOUNTER — Other Ambulatory Visit: Payer: Self-pay | Admitting: Physician Assistant

## 2012-10-23 DIAGNOSIS — K760 Fatty (change of) liver, not elsewhere classified: Secondary | ICD-10-CM

## 2012-11-06 ENCOUNTER — Ambulatory Visit (INDEPENDENT_AMBULATORY_CARE_PROVIDER_SITE_OTHER): Payer: 59 | Admitting: Professional

## 2012-11-06 DIAGNOSIS — F331 Major depressive disorder, recurrent, moderate: Secondary | ICD-10-CM

## 2012-11-13 ENCOUNTER — Encounter: Payer: Self-pay | Admitting: Physician Assistant

## 2012-11-13 DIAGNOSIS — IMO0001 Reserved for inherently not codable concepts without codable children: Secondary | ICD-10-CM

## 2012-11-20 ENCOUNTER — Ambulatory Visit (INDEPENDENT_AMBULATORY_CARE_PROVIDER_SITE_OTHER): Payer: Medicare Other | Admitting: Professional

## 2012-11-20 DIAGNOSIS — F331 Major depressive disorder, recurrent, moderate: Secondary | ICD-10-CM

## 2012-11-27 ENCOUNTER — Encounter: Payer: Self-pay | Admitting: Physician Assistant

## 2012-11-27 DIAGNOSIS — IMO0001 Reserved for inherently not codable concepts without codable children: Secondary | ICD-10-CM

## 2012-11-28 ENCOUNTER — Emergency Department (HOSPITAL_COMMUNITY)
Admission: EM | Admit: 2012-11-28 | Discharge: 2012-11-28 | Disposition: A | Discharge status: Home / Self Care | Payer: Medicare Other | Attending: Emergency Medicine | Admitting: Emergency Medicine

## 2012-11-28 ENCOUNTER — Encounter (HOSPITAL_COMMUNITY): Payer: Self-pay | Admitting: Emergency Medicine

## 2012-11-28 ENCOUNTER — Emergency Department (HOSPITAL_COMMUNITY): Payer: Medicare Other

## 2012-11-28 DIAGNOSIS — M542 Cervicalgia: Secondary | ICD-10-CM | POA: Insufficient documentation

## 2012-11-28 DIAGNOSIS — Z8679 Personal history of other diseases of the circulatory system: Secondary | ICD-10-CM | POA: Insufficient documentation

## 2012-11-28 DIAGNOSIS — Z87891 Personal history of nicotine dependence: Secondary | ICD-10-CM | POA: Insufficient documentation

## 2012-11-28 DIAGNOSIS — Z8669 Personal history of other diseases of the nervous system and sense organs: Secondary | ICD-10-CM | POA: Insufficient documentation

## 2012-11-28 DIAGNOSIS — J45901 Unspecified asthma with (acute) exacerbation: Secondary | ICD-10-CM | POA: Insufficient documentation

## 2012-11-28 DIAGNOSIS — Z8739 Personal history of other diseases of the musculoskeletal system and connective tissue: Secondary | ICD-10-CM | POA: Insufficient documentation

## 2012-11-28 DIAGNOSIS — K219 Gastro-esophageal reflux disease without esophagitis: Secondary | ICD-10-CM | POA: Insufficient documentation

## 2012-11-28 DIAGNOSIS — F329 Major depressive disorder, single episode, unspecified: Secondary | ICD-10-CM | POA: Insufficient documentation

## 2012-11-28 DIAGNOSIS — Z8719 Personal history of other diseases of the digestive system: Secondary | ICD-10-CM | POA: Insufficient documentation

## 2012-11-28 DIAGNOSIS — Z791 Long term (current) use of non-steroidal anti-inflammatories (NSAID): Secondary | ICD-10-CM | POA: Insufficient documentation

## 2012-11-28 DIAGNOSIS — G8929 Other chronic pain: Secondary | ICD-10-CM | POA: Insufficient documentation

## 2012-11-28 DIAGNOSIS — I10 Essential (primary) hypertension: Secondary | ICD-10-CM | POA: Insufficient documentation

## 2012-11-28 DIAGNOSIS — Z98811 Dental restoration status: Secondary | ICD-10-CM | POA: Insufficient documentation

## 2012-11-28 DIAGNOSIS — Z79899 Other long term (current) drug therapy: Secondary | ICD-10-CM | POA: Insufficient documentation

## 2012-11-28 DIAGNOSIS — Z91040 Latex allergy status: Secondary | ICD-10-CM | POA: Insufficient documentation

## 2012-11-28 DIAGNOSIS — F3289 Other specified depressive episodes: Secondary | ICD-10-CM | POA: Insufficient documentation

## 2012-11-28 DIAGNOSIS — F411 Generalized anxiety disorder: Secondary | ICD-10-CM | POA: Insufficient documentation

## 2012-11-28 DIAGNOSIS — M549 Dorsalgia, unspecified: Secondary | ICD-10-CM | POA: Insufficient documentation

## 2012-11-28 DIAGNOSIS — K589 Irritable bowel syndrome without diarrhea: Secondary | ICD-10-CM | POA: Insufficient documentation

## 2012-11-28 MED ORDER — DIAZEPAM 5 MG PO TABS
5.0000 mg | ORAL_TABLET | Freq: Once | ORAL | Status: AC
  Administered 2012-11-28: 5 mg via ORAL
  Filled 2012-11-28: qty 1

## 2012-11-28 MED ORDER — KETOROLAC TROMETHAMINE 60 MG/2ML IM SOLN
60.0000 mg | Freq: Once | INTRAMUSCULAR | Status: AC
  Administered 2012-11-28: 60 mg via INTRAMUSCULAR
  Filled 2012-11-28: qty 2

## 2012-11-28 MED ORDER — DIAZEPAM 5 MG PO TABS: 5.0000 mg | ORAL_TABLET | Freq: Two times a day (BID) | ORAL | Status: DC

## 2012-11-28 NOTE — ED Notes (Signed)
Per EMS, woke up Monday am with stiff neck-increased pain-unable to turn head-was at pain clinic this am for foot pain

## 2012-11-28 NOTE — ED Provider Notes (Signed)
Medical screening examination/treatment/procedure(s) were performed by non-physician practitioner and as supervising physician I was immediately available for consultation/collaboration.  Jasper Riling. Alvino Chapel, MD 11/28/12 [phone]

## 2012-11-28 NOTE — ED Provider Notes (Signed)
History    CSN: [id number] Arrival date & time 11/28/12  1211  First MD Initiated Contact with Patient 11/28/12 1253     Chief Complaint  Patient presents with  . Neck Pain   (Consider location/radiation/quality/duration/timing/severity/associated sxs/prior Treatment) HPI Comments: Patient is a 57 year old female with a past medical history of fibromyalgia and chronic pain who presents with a 3 day history of neck and back pain. Patient reports waking up with the symptoms 3 days ago and have progressively worsened. The pain is aching and severe without radiation. Patient did not try anything for pain relief. Movement and palpation makes the pain worse. Nothing makes the pain better. No injury or associated symptoms.   Past Medical History  Diagnosis Date  . Angina   . GERD (gastroesophageal reflux disease)   . Arthritis   . Anxiety   . Fibromyalgia   . Headache(784.0)     sinus  . Shortness of breath     every day - either at rest or exertion  . Bursitis     right hip  . Anal fissure 02/2012  . Hypertension     under control, has been on med. since 2010  . Tarsal tunnel syndrome of left side   . Complication of anesthesia     difficult to wake up post-op  . Depression   . Asthma     daily and prn inhalers  . Irritable bowel syndrome (IBS)   . Cardiomegaly   . TMJ (temporomandibular joint syndrome)     left  . Dental crowns present   . Wears partial dentures     upper and lower   Past Surgical History  Procedure Laterality Date  . Shoulder open rotator cuff repair  1999    left  . Abdominal hysterectomy      partial  . Bilateral oophorectomy  04/2011  . Cystoscopy w/ ureteral stent placement  08/04/2009  . Laparoscopic lysis intestinal adhesions  08/04/2009  . Cardiac catheterization  10/08/2007  . Examination under anesthesia  02/17/2012    Procedure: EXAM UNDER ANESTHESIA;  Surgeon: Pedro Earls, MD;  Location: [place];  Service: General;   Laterality: N/A;  Exam under anesthesia with lateral internal sphincterotomy  . Sphincterotomy  02/17/2012    Procedure: SPHINCTEROTOMY;  Surgeon: Pedro Earls, MD;  Location: [place];  Service: General;  Laterality: N/A;   Family History  Problem Relation Age of Onset  . Cancer Mother     liver  . Cancer Maternal Grandmother     breast  . Anesthesia problems Sister     hard to wake up post-op   History  Substance Use Topics  . Smoking status: Former Smoker -- 0.50 packs/day for 30 years    Types: Cigarettes    Quit date: 06/05/2012  . Smokeless tobacco: Never Used     Comment: working on quitting.  . Alcohol Use: No   OB History   Grav Para Term Preterm Abortions TAB SAB Ect Mult Living                 Review of Systems  HENT: Positive for neck pain.   Musculoskeletal: Positive for back pain.  All other systems reviewed and are negative.    Allergies  Codeine; Propoxyphene-acetaminophen; Sulfonamide derivatives; Alka-seltzer; Iohexol; and Latex  Home Medications   Current Outpatient Rx  Name  Route  Sig  Dispense  Refill  . ALPRAZolam (XANAX) 1 MG tablet  Oral   Take 0.5-1 tablets (0.5-1 mg total) by mouth at bedtime as needed for sleep or anxiety.   30 tablet   0   . amLODipine (NORVASC) 5 MG tablet   Oral   Take 0.5 tablets (2.5 mg total) by mouth daily.   45 tablet   3   . calcipotriene-betamethasone (TACLONEX) ointment   Topical   Apply 1 application topically daily. For psoriasis         . citalopram (CELEXA) 20 MG tablet   Oral   Take 1 tablet (20 mg total) by mouth daily.   90 tablet   3   . DEXILANT 60 MG capsule      TAKE 1 CAPSULE BY MOUTH EVERY DAY   30 capsule   5   . dicyclomine (BENTYL) 10 MG capsule   Oral   Take 10 mg by mouth 4 (four) times daily -  before meals and at bedtime.         . docusate sodium (COLACE) 100 MG capsule   Oral   Take 100 mg by mouth 2 (two) times daily.         .  fenofibrate 160 MG tablet   Oral   Take 1 tablet (160 mg total) by mouth daily.   90 tablet   3   . fluticasone (FLONASE) 50 MCG/ACT nasal spray   Nasal   Place 2 sprays into the nose daily.   16 g   6   . glycopyrrolate (ROBINUL) 2 MG tablet   Oral   Take 2 mg by mouth daily as needed. For secretions in the stomach         . ibuprofen (ADVIL,MOTRIN) 800 MG tablet               . ipratropium (ATROVENT) 0.03 % nasal spray   Nasal   Place 1 spray into the nose every 12 (twelve) hours.         . Linaclotide (LINZESS) 145 MCG CAPS   Oral   Take 145 mcg by mouth daily.         . metaxalone (SKELAXIN) 800 MG tablet   Oral   Take 400-800 mg by mouth daily as needed. For muscle spasms         . Multiple Vitamin (MULTIVITAMIN) tablet   Oral   Take 1 tablet by mouth daily.         . Omega-3 Fatty Acids (FISH OIL) 1000 MG CAPS   Oral   Take 1 capsule by mouth daily.          Marland Kitchen oxyCODONE-acetaminophen (PERCOCET) 5-325 MG per tablet   Oral   Take 1 tablet by mouth every 8 (eight) hours as needed. For pain         . ranitidine (ZANTAC) 150 MG tablet   Oral   Take 150 mg by mouth 2 (two) times daily.         Marland Kitchen zolpidem (AMBIEN) 10 MG tablet   Oral   Take 5 mg by mouth at bedtime as needed. For sleep.         Marland Kitchen EXPIRED: albuterol (PROVENTIL HFA;VENTOLIN HFA) 108 (90 BASE) MCG/ACT inhaler   Inhalation   Inhale 2 puffs into the lungs every 4 (four) hours as needed. For wheezing and shortness of breath.         . beclomethasone (QVAR) 40 MCG/ACT inhaler   Inhalation   Inhale 2 puffs into the lungs 2 (two) times  daily as needed. For shortness of breath         . NITROSTAT 0.4 MG SL tablet   Sublingual   Place 0.4 mg under the tongue Ad lib. For chest pain.          BP 119/69  Pulse 45  Temp(Src) 98.1 F (36.7 C) (Oral)  Resp 16  SpO2 100% Physical Exam  Nursing note and vitals reviewed. Constitutional: She is oriented to person, place,  and time. She appears well-developed and well-nourished. No distress.  HENT:  Head: Normocephalic and atraumatic.  Eyes: Conjunctivae and EOM are normal.  Neck:  ROM limited due to pain.   Cardiovascular: Normal rate and regular rhythm.  Exam reveals no gallop and no friction rub.   No murmur heard. Pulmonary/Chest: Effort normal and breath sounds normal. She has no wheezes. She has no rales. She exhibits no tenderness.  Abdominal: Soft. There is no tenderness.  Musculoskeletal: Normal range of motion.  Left trapezius and neck tender to palpation. Midline cervical spine tenderness to palpation.   Lymphadenopathy:    She has no cervical adenopathy.  Neurological: She is alert and oriented to person, place, and time. Coordination normal.  Upper extremity strength and sensation equal and intact bilaterally. Speech is goal-oriented. Moves limbs without ataxia.   Skin: Skin is warm and dry.  Psychiatric: She has a normal mood and affect. Her behavior is normal.    ED Course  Procedures (including critical care time) Labs Reviewed - No data to display Dg Cervical Spine Complete  11/28/2012   *RADIOLOGY REPORT*  Clinical Data: 57 year old female with neck pain greater on the left. No known injury.  CERVICAL SPINE - COMPLETE 4+ VIEW  Comparison: None available.  Findings: Preserved cervical lordosis.  Normal prevertebral soft tissue contour.  Relatively preserved disc spaces. Very mild endplate spurring at K7-Q2 and C6-C7.  Bilateral posterior element alignment is within normal limits.  Rightward flexion of the neck on the AP view.  Otherwise normal AP alignment.  C1-C2 alignment and odontoid within normal limits.  Negative lung apices.  IMPRESSION: No acute osseous abnormality in the cervical spine.  Relatively preserved disc spaces.  Mild endplate spurring at V9-D6 and C6-C7.   Original Report Authenticated By: Roselyn Reef, M.D.   1. Neck pain on left side     MDM  1:52 PM Cervical spine  xray pending. Patient given toradol and valium for pain.   2:09 PM Xray unremarkable. Patient will have valium for muscle pain. Patient instructed to follow up with her PCP for further evaluation.   Alvina Chou, PA-C 11/28/12 1409

## 2012-12-03 ENCOUNTER — Encounter: Payer: Self-pay | Admitting: Physician Assistant

## 2012-12-03 ENCOUNTER — Ambulatory Visit (INDEPENDENT_AMBULATORY_CARE_PROVIDER_SITE_OTHER): Payer: 59 | Admitting: Physician Assistant

## 2012-12-03 VITALS — BP 154/84 | HR 52 | Temp 97.8°F | Resp 18 | Ht 67.0 in | Wt 154.0 lb

## 2012-12-03 DIAGNOSIS — IMO0001 Reserved for inherently not codable concepts without codable children: Secondary | ICD-10-CM

## 2012-12-03 DIAGNOSIS — M542 Cervicalgia: Secondary | ICD-10-CM

## 2012-12-03 MED ORDER — DIAZEPAM 5 MG PO TABS: 5.0000 mg | ORAL_TABLET | Freq: Two times a day (BID) | ORAL | Status: DC

## 2012-12-03 MED ORDER — PREDNISONE 20 MG PO TABS: ORAL_TABLET | ORAL | Status: DC

## 2012-12-03 NOTE — Patient Instructions (Signed)
Take the Dexilant while you're taking the prednisone, to help prevent stomach upset.  If you develop stomach upset, you may stop the prednisone. Continue applying the warm compress to the neck for 15-20 minutes 2-3 times daily.

## 2012-12-04 ENCOUNTER — Encounter: Payer: Self-pay | Admitting: Physician Assistant

## 2012-12-04 ENCOUNTER — Ambulatory Visit: Payer: 59 | Admitting: Professional

## 2012-12-04 NOTE — Progress Notes (Signed)
  Subjective:    Patient ID: [name], female    DOB: [birth year], 57 y.o.   MRN: [id number]  HPI This 57 y.o. female presents for evaluation of LEFT sided neck pain.  She was seen by Rheumatology on 11/26/12 with the same and was prescribed cyclobenzaprine.  That was ineffective, and on 11/28/2012 she was seen by pain management for her routine visit and tarsal tunnel injection.  The percocet dose was increased from 5/325 to 7.5/325 and she was given a trial of tizanidine.  When she left that office for home, her symptoms worsened and she had to pull her car over to the side of the road.  She was then transported to the ED by EMS.  Neck films were normal, she was given valium 5 mg BID and sent home, with instructions to follow-up here.  The valium has helped some, but not completely.  She has constant pain and spasm in the LEFT neck that extends into the LEFT shoulder.  No CP, SOB, dizziness. No jaw pain. ROM is decreased, but has improved some since starting the Valium.  Past medical history, surgical history, family history, social history and problem list reviewed.   Review of Systems As above.    Objective:   Physical Exam Blood pressure 154/84, pulse 52, temperature 97.8 F (36.6 C), temperature source Oral, resp. rate 18, height 5' 7"  (1.702 m), weight 154 lb (69.854 kg), SpO2 99.00%. Body mass index is 24.11 kg/(m^2). Well-developed, well nourished BF who is awake, alert and oriented, in NAD, but obviously uncomfortable and moving tentatively. HEENT: [place]/AT, PERRL, EOMI.  Sclera and conjunctiva are clear.  EAC are patent, TMs are normal in appearance. Nasal mucosa is pink and moist. OP is clear. Neck: supple, no lymphadenopathy, thyromegaly. Decreased ROM of the neck due to pain.  Most reduced with rotation to the LEFT.  Very tender along the paraspinous muscles and trapezius on the LEFT, but also of the SCM. Heart: RRR, no murmur Lungs: normal effort, CTA Extremities: no cyanosis,  clubbing or edema. Normal strength and sensation of the upper extremities. Skin: warm and dry without rash. Psychologic: good mood and appropriate affect, normal speech and behavior.        Assessment & Plan:  Neck pain on left side - Plan: diazepam (VALIUM) 5 MG tablet, predniSONE (DELTASONE) 20 MG tablet  Warm compresses, gentle ROM.  Follow-up with Pain Management as planned. Return here as planned in several months, sooner if these symptoms persist.  Fara Chute, PA-C Physician Assistant-Certified [facility]

## 2012-12-11 ENCOUNTER — Ambulatory Visit (INDEPENDENT_AMBULATORY_CARE_PROVIDER_SITE_OTHER): Payer: 59 | Admitting: Professional

## 2012-12-11 DIAGNOSIS — F331 Major depressive disorder, recurrent, moderate: Secondary | ICD-10-CM

## 2012-12-17 ENCOUNTER — Encounter: Payer: Medicare Other | Attending: Physician Assistant | Admitting: *Deleted

## 2012-12-17 ENCOUNTER — Encounter: Payer: Self-pay | Admitting: *Deleted

## 2012-12-17 VITALS — Ht 67.0 in | Wt 152.5 lb

## 2012-12-17 DIAGNOSIS — Z713 Dietary counseling and surveillance: Secondary | ICD-10-CM | POA: Insufficient documentation

## 2012-12-17 DIAGNOSIS — K7689 Other specified diseases of liver: Secondary | ICD-10-CM | POA: Diagnosis not present

## 2012-12-17 NOTE — Progress Notes (Signed)
Medical Nutrition Therapy:  Appt start time: 1030 end time:  1130.  Assessment:  Primary concern today: Fatty liver. Patient with history of fatty liver, pre-diabetes, and diverticulosis. She reports that she has been trying to limit fat and sugar in the diet, and is reading food labels, but she would like more information to improve her condition. She does try to walk several days weekly, but is limited by fibromyalgia.   MEDICATIONS: Reviewed   DIETARY INTAKE:   Usual eating pattern includes 3 meals and 2-3 snacks per day.  24-hr recall:  B ( AM): Oatmeal with banana/peach OR Special K/Multigrain Cheerios with Lactaid fat free, water or diet "juice"  Snk ( AM): Same L ( PM): Leftovers OR [place] sandwich with onions/lettuce, mustard, and light mayo OR [place] hot dog  Snk ( PM): Fruit, peanuts, Mikey Kirschner D ( PM): Baked chicken/turkey/pork chop, vegetable (cabbage, squash, onions, okra, greens), brown rice OR pot pie with salad Snk ( PM): Same, sugar free ice cream in a cone Beverages: 6 bottles of water  Usual physical activity: Walking 2-3 days weekly, 15 minutes  Estimated energy needs: 1600 calories 200 g carbohydrates 100 g protein 44 g fat  Progress Towards Goal(s):  In progress.   Nutritional Diagnosis:  NB-1.1 Food and nutrition-related knowledge deficit As related to fatty liver.  As evidenced by no prior education.    Intervention:  Nutrition counseling. Patient educated on consuming a fat-restricted diet, including foods high in fat, meal planning, healthier fats to choose, and food preparation. We also discussed food label reading.   1. Limit intake of high fat foods. Choose healthier fats.  2. Read food labels.  3. Continue to exercise as able. Increase frequency or duration as tolerated.   Handouts given during visit include:  Fat-restricted nutrition therapy  Food label reading tips  Monitoring/Evaluation:  Dietary intake, exercise, and body weight in 2  month(s).

## 2012-12-27 ENCOUNTER — Emergency Department (HOSPITAL_COMMUNITY): Payer: Medicare Other

## 2012-12-27 ENCOUNTER — Observation Stay (HOSPITAL_COMMUNITY)
Admission: EM | Admit: 2012-12-27 | Discharge: 2012-12-28 | Disposition: A | Discharge status: Home / Self Care | Payer: Medicare Other | Attending: Cardiovascular Disease | Admitting: Cardiovascular Disease

## 2012-12-27 ENCOUNTER — Encounter (HOSPITAL_COMMUNITY): Payer: Self-pay | Admitting: *Deleted

## 2012-12-27 DIAGNOSIS — I498 Other specified cardiac arrhythmias: Secondary | ICD-10-CM | POA: Insufficient documentation

## 2012-12-27 DIAGNOSIS — F411 Generalized anxiety disorder: Secondary | ICD-10-CM | POA: Insufficient documentation

## 2012-12-27 DIAGNOSIS — R42 Dizziness and giddiness: Principal | ICD-10-CM | POA: Insufficient documentation

## 2012-12-27 DIAGNOSIS — R001 Bradycardia, unspecified: Secondary | ICD-10-CM

## 2012-12-27 DIAGNOSIS — IMO0001 Reserved for inherently not codable concepts without codable children: Secondary | ICD-10-CM | POA: Insufficient documentation

## 2012-12-27 DIAGNOSIS — R079 Chest pain, unspecified: Secondary | ICD-10-CM

## 2012-12-27 DIAGNOSIS — R0789 Other chest pain: Secondary | ICD-10-CM | POA: Insufficient documentation

## 2012-12-27 DIAGNOSIS — R55 Syncope and collapse: Secondary | ICD-10-CM

## 2012-12-27 DIAGNOSIS — K589 Irritable bowel syndrome without diarrhea: Secondary | ICD-10-CM | POA: Insufficient documentation

## 2012-12-27 DIAGNOSIS — Z79899 Other long term (current) drug therapy: Secondary | ICD-10-CM | POA: Insufficient documentation

## 2012-12-27 HISTORY — DX: Hepatitis a without hepatic coma: B15.9

## 2012-12-27 LAB — POCT I-STAT TROPONIN I: Troponin i, poc: 0.01 ng/mL (ref 0.00–0.08)

## 2012-12-27 LAB — CBC
HCT: 37.2 % (ref 36.0–46.0)
HCT: 38.1 % (ref 36.0–46.0)
Hemoglobin: 12.4 g/dL (ref 12.0–15.0)
MCH: 27.5 pg (ref 26.0–34.0)
MCHC: 33.6 g/dL (ref 30.0–36.0)
MCHC: 32.5 g/dL (ref 30.0–36.0)
MCV: 81.9 fL (ref 78.0–100.0)
RBC: 4.66 MIL/uL (ref 3.87–5.11)
RDW: 12.9 % (ref 11.5–15.5)
WBC: 7.1 10*3/uL (ref 4.0–10.5)
WBC: 6.8 10*3/uL (ref 4.0–10.5)

## 2012-12-27 LAB — URINALYSIS, ROUTINE W REFLEX MICROSCOPIC
Bilirubin Urine: NEGATIVE
Hgb urine dipstick: NEGATIVE
Ketones, ur: NEGATIVE mg/dL
Specific Gravity, Urine: 1.006 (ref 1.005–1.030)
Urobilinogen, UA: 0.2 mg/dL (ref 0.0–1.0)

## 2012-12-27 LAB — BASIC METABOLIC PANEL
BUN: 13 mg/dL (ref 6–23)
CO2: 25 mEq/L (ref 19–32)
Calcium: 9.6 mg/dL (ref 8.4–10.5)
Chloride: 106 mEq/L (ref 96–112)
Creatinine, Ser: 0.63 mg/dL (ref 0.50–1.10)
Glucose, Bld: 94 mg/dL (ref 70–99)

## 2012-12-27 LAB — CREATININE, SERUM
GFR calc Af Amer: >90 mL/min (ref >90)
GFR calc non Af Amer: >90 mL/min (ref >90)

## 2012-12-27 MED ORDER — CALCIPOTRIENE-BETAMETH DIPROP 0.005-0.064 % EX OINT: 1.0000 "application " | TOPICAL_OINTMENT | Freq: Every day | CUTANEOUS | Status: DC

## 2012-12-27 MED ORDER — OXYCODONE-ACETAMINOPHEN 5-325 MG PO TABS
1.0000 | ORAL_TABLET | ORAL | Status: DC | PRN
  Administered 2012-12-27 – 2012-12-28 (×3): 1 via ORAL
  Filled 2012-12-27 (×3): qty 1

## 2012-12-27 MED ORDER — ADULT MULTIVITAMIN W/MINERALS CH
1.0000 | ORAL_TABLET | Freq: Every day | ORAL | Status: DC
  Administered 2012-12-28: 1 via ORAL
  Filled 2012-12-27: qty 1

## 2012-12-27 MED ORDER — PANTOPRAZOLE SODIUM 40 MG PO TBEC
40.0000 mg | DELAYED_RELEASE_TABLET | Freq: Every day | ORAL | Status: DC
  Administered 2012-12-28: 40 mg via ORAL
  Filled 2012-12-27: qty 1

## 2012-12-27 MED ORDER — LINACLOTIDE 145 MCG PO CAPS: 145.0000 ug | ORAL_CAPSULE | Freq: Every day | ORAL | Status: DC

## 2012-12-27 MED ORDER — GLYCOPYRROLATE 1 MG PO TABS
2.0000 mg | ORAL_TABLET | Freq: Every day | ORAL | Status: DC | PRN
  Filled 2012-12-27: qty 2

## 2012-12-27 MED ORDER — SODIUM CHLORIDE 0.9 % IJ SOLN
3.0000 mL | Freq: Two times a day (BID) | INTRAMUSCULAR | Status: DC
  Administered 2012-12-27 – 2012-12-28 (×2): 3 mL via INTRAVENOUS

## 2012-12-27 MED ORDER — HYDROMORPHONE HCL PF 1 MG/ML IJ SOLN
1.0000 mg | Freq: Once | INTRAMUSCULAR | Status: AC
  Administered 2012-12-27: 1 mg via INTRAVENOUS
  Filled 2012-12-27: qty 1

## 2012-12-27 MED ORDER — HEPARIN SODIUM (PORCINE) 5000 UNIT/ML IJ SOLN
5000.0000 [IU] | Freq: Three times a day (TID) | INTRAMUSCULAR | Status: DC
  Administered 2012-12-27 – 2012-12-28 (×2): 5000 [IU] via SUBCUTANEOUS
  Filled 2012-12-27 (×6): qty 1

## 2012-12-27 MED ORDER — ALPRAZOLAM 0.25 MG PO TABS: 0.5000 mg | ORAL_TABLET | Freq: Every evening | ORAL | Status: DC | PRN

## 2012-12-27 MED ORDER — FLUTICASONE PROPIONATE 50 MCG/ACT NA SUSP
2.0000 | Freq: Every day | NASAL | Status: DC
  Filled 2012-12-27: qty 16

## 2012-12-27 MED ORDER — FAMOTIDINE 20 MG PO TABS
20.0000 mg | ORAL_TABLET | Freq: Every day | ORAL | Status: DC
  Administered 2012-12-28: 20 mg via ORAL
  Filled 2012-12-27: qty 1

## 2012-12-27 MED ORDER — DOCUSATE SODIUM 100 MG PO CAPS
100.0000 mg | ORAL_CAPSULE | Freq: Every day | ORAL | Status: DC
  Administered 2012-12-27 – 2012-12-28 (×2): 100 mg via ORAL
  Filled 2012-12-27 (×3): qty 1

## 2012-12-27 MED ORDER — ALBUTEROL SULFATE HFA 108 (90 BASE) MCG/ACT IN AERS: 2.0000 | INHALATION_SPRAY | Freq: Four times a day (QID) | RESPIRATORY_TRACT | Status: DC | PRN

## 2012-12-27 MED ORDER — CITALOPRAM HYDROBROMIDE 20 MG PO TABS
20.0000 mg | ORAL_TABLET | Freq: Every day | ORAL | Status: DC
Start: 2012-12-27 — End: 2012-12-28
  Administered 2012-12-28: 10 mg via ORAL
  Filled 2012-12-27: qty 1

## 2012-12-27 MED ORDER — NITROGLYCERIN 0.4 MG SL SUBL: 0.4000 mg | SUBLINGUAL_TABLET | SUBLINGUAL | Status: DC | PRN

## 2012-12-27 MED ORDER — ZOLPIDEM TARTRATE 5 MG PO TABS
5.0000 mg | ORAL_TABLET | Freq: Every evening | ORAL | Status: DC | PRN
Start: 2012-12-27 — End: 2012-12-28

## 2012-12-27 MED ORDER — FENOFIBRATE 160 MG PO TABS
160.0000 mg | ORAL_TABLET | Freq: Every day | ORAL | Status: DC
  Administered 2012-12-28: 160 mg via ORAL
  Filled 2012-12-27: qty 1

## 2012-12-27 NOTE — ED Provider Notes (Signed)
CSN: [id number]     Arrival date & time 12/27/12  1426 History     First MD Initiated Contact with Patient 12/27/12 1439     Chief Complaint  Patient presents with  . Bradycardia  . Near Syncope   (Consider location/radiation/quality/duration/timing/severity/associated sxs/prior Treatment) HPI Comments: This patient has a history of bradycardia, angina, GERD, fibromyalgia, hypertension, asthma, mood disorder NOS and presents via EMS due to abrupt onset of generalized weakness and lightheadedness. She states that yesterday she was in her usual state of health; however, she did note a minor episode of chest pain that was substernal, nonradiating, and lasted approximately 2 hours and was constant. This morning, she felt increased fatigue; however she continued to go about her day. While she was driving her car the lightheadedness and generalized weakness became worse. She called EMS. Upon their arrival, they found that her heart rate was in the 40s.  She denies any current pain, fevers, nausea, vomiting, diarrhea, dysuria, or other infectious symptoms.   Past Medical History  Diagnosis Date  . Angina   . GERD (gastroesophageal reflux disease)   . Arthritis   . Anxiety   . Fibromyalgia   . Headache(784.0)     sinus  . Shortness of breath     every day - either at rest or exertion  . Bursitis     right hip  . Anal fissure 02/2012  . Hypertension     under control, has been on med. since 2010  . Tarsal tunnel syndrome of left side   . Complication of anesthesia     difficult to wake up post-op  . Depression   . Asthma     daily and prn inhalers  . Irritable bowel syndrome (IBS)   . Cardiomegaly   . TMJ (temporomandibular joint syndrome)     left  . Dental crowns present   . Wears partial dentures     upper and lower   Past Surgical History  Procedure Laterality Date  . Shoulder open rotator cuff repair  1999    left  . Abdominal hysterectomy      partial  . Bilateral  oophorectomy  04/2011  . Cystoscopy w/ ureteral stent placement  08/04/2009  . Laparoscopic lysis intestinal adhesions  08/04/2009  . Cardiac catheterization  10/08/2007  . Examination under anesthesia  02/17/2012    Procedure: EXAM UNDER ANESTHESIA;  Surgeon: Pedro Earls, MD;  Location: [facility];  Service: General;  Laterality: N/A;  Exam under anesthesia with lateral internal sphincterotomy  . Sphincterotomy  02/17/2012    Procedure: SPHINCTEROTOMY;  Surgeon: Pedro Earls, MD;  Location: [facility];  Service: General;  Laterality: N/A;   Family History  Problem Relation Age of Onset  . Cancer Mother     liver  . Cancer Maternal Grandmother     breast  . Anesthesia problems Sister     hard to wake up post-op   History  Substance Use Topics  . Smoking status: Former Smoker -- 0.50 packs/day for 30 years    Types: Cigarettes    Quit date: 06/05/2012  . Smokeless tobacco: Never Used     Comment: working on quitting.  . Alcohol Use: No   OB History   Grav Para Term Preterm Abortions TAB SAB Ect Mult Living                 Review of Systems  Constitutional: Positive for fatigue. Negative for fever.  HENT: Negative.   Eyes: Negative.   Respiratory: Negative.   Cardiovascular:       See history of present illness  Gastrointestinal: Negative.   Endocrine: Negative.   Genitourinary: Negative.   Musculoskeletal: Negative.   Skin: Negative.   Allergic/Immunologic: Negative.   Neurological: Positive for light-headedness.  Hematological: Negative.   Psychiatric/Behavioral: Negative.   All other systems reviewed and are negative.    Allergies  Codeine; Propoxyphene-acetaminophen; Sulfonamide derivatives; Alka-seltzer; Iohexol; and Latex  Home Medications   Current Outpatient Rx  Name  Route  Sig  Dispense  Refill  . ALPRAZolam (XANAX) 1 MG tablet   Oral   Take 0.5-1 tablets (0.5-1 mg total) by mouth at bedtime as needed for sleep or  anxiety.   30 tablet   0   . amLODipine (NORVASC) 5 MG tablet   Oral   Take 0.5 tablets (2.5 mg total) by mouth daily.   45 tablet   3   . calcipotriene-betamethasone (TACLONEX) ointment   Topical   Apply 1 application topically daily. For psoriasis         . citalopram (CELEXA) 20 MG tablet   Oral   Take 1 tablet (20 mg total) by mouth daily.   90 tablet   3   . dexlansoprazole (DEXILANT) 60 MG capsule   Oral   Take 60 mg by mouth daily as needed (for acid reflux).         . diazepam (VALIUM) 5 MG tablet   Oral   Take 5 mg by mouth daily.         Marland Kitchen docusate sodium (COLACE) 100 MG capsule   Oral   Take 100 mg by mouth daily.          . fenofibrate 160 MG tablet   Oral   Take 1 tablet (160 mg total) by mouth daily.   90 tablet   3   . fluticasone (FLONASE) 50 MCG/ACT nasal spray   Nasal   Place 2 sprays into the nose daily.   16 g   6   . ibuprofen (ADVIL,MOTRIN) 800 MG tablet   Oral   Take 800 mg by mouth every 6 (six) hours as needed.          . Linaclotide (LINZESS) 145 MCG CAPS   Oral   Take 145 mcg by mouth daily.         . metaxalone (SKELAXIN) 800 MG tablet   Oral   Take 400-800 mg by mouth daily as needed for pain (for muscle spasms).          . Multiple Vitamin (MULTIVITAMIN) tablet   Oral   Take 1 tablet by mouth daily.         Marland Kitchen oxyCODONE-acetaminophen (PERCOCET) 5-325 MG per tablet   Oral   Take 1 tablet by mouth every 8 (eight) hours as needed for pain.          . Probiotic Product (PROBIOTIC DAILY) CAPS   Oral   Take 1 capsule by mouth 2 (two) times a week.         . ranitidine (ZANTAC) 150 MG tablet   Oral   Take 150 mg by mouth as needed for heartburn.          . zolpidem (AMBIEN) 10 MG tablet   Oral   Take 5 mg by mouth at bedtime as needed for sleep.          Marland Kitchen albuterol (PROVENTIL HFA;VENTOLIN HFA) 108 (  90 BASE) MCG/ACT inhaler   Inhalation   Inhale 2 puffs into the lungs every 6 (six) hours as  needed for wheezing.         . dicyclomine (BENTYL) 10 MG capsule   Oral   Take 10 mg by mouth 3 (three) times daily as needed (for stomach).          Marland Kitchen glycopyrrolate (ROBINUL) 2 MG tablet   Oral   Take 2 mg by mouth daily as needed. For secretions in the stomach         . NITROSTAT 0.4 MG SL tablet   Sublingual   Place 0.4 mg under the tongue every 5 (five) minutes as needed for chest pain. For chest pain.          BP 130/59  Pulse 43  Temp(Src) 97.8 F (36.6 C) (Oral)  Resp 16  SpO2 99% Physical Exam CONSTITUTIONAL  well developed, well nourished, alert, non toxic appearing HEENT  normocephalic, atraumatic, external ears normal, nose normal, mucus membranes moist, oropharynx clear EYES  normal conjunctiva, no sclera icterus noted, EOM intact, PERRL NECK  normal ROM, supple, no JVD, trachea normal, no mass CARDIOVASCULAR  no signifcant murmur noted, full and effective pulses; regular rhythm, bradycardic with rate in the 40s. PULMONARY/CHEST WALL  normal effort, no respiratory distress, BS normal, no wheezes, no rhonchi, no rales ABDOMINAL  normal appearance, no distension, no mass, no pulsatile mass; soft, non tender, no rigidity, guarding or rebound GU  deferred MUSCULOSKELETAL  Normal range of motion of extremities;  no peripheral edema, no calf tenderness or cords NEUROLOGICAL  patient is awake and responsive;  no significant motor or sensory deficit noted  SKIN  warm, dry, intact, no rash PSYCHIATRIC  flat affect  ED Course   Procedures (including critical care time)  EKG: Rate 48 sinus bradycardia normal PR interval, no widened QRS, QTC 394. Nonspecific ST changes diffusely.  Results for orders placed during the hospital encounter of 12/27/12  GLUCOSE, CAPILLARY      Result Value Range   Glucose-Capillary 101 (*) 70 - 99 mg/dL  BASIC METABOLIC PANEL      Result Value Range   Sodium 141  135 - 145 mEq/L   Potassium 4.1  3.5 - 5.1 mEq/L   Chloride 106  96  - 112 mEq/L   CO2 25  19 - 32 mEq/L   Glucose, Bld 94  70 - 99 mg/dL   BUN 13  6 - 23 mg/dL   Creatinine, Ser 0.63  0.50 - 1.10 mg/dL   Calcium 9.6  8.4 - 10.5 mg/dL   GFR calc non Af Amer >90  >90 mL/min   GFR calc Af Amer >90  >90 mL/min  CBC      Result Value Range   WBC 7.1  4.0 - 10.5 K/uL   RBC 4.54  3.87 - 5.11 MIL/uL   Hemoglobin 12.5  12.0 - 15.0 g/dL   HCT 37.2  36.0 - 46.0 %   MCV 81.9  78.0 - 100.0 fL   MCH 27.5  26.0 - 34.0 pg   MCHC 33.6  30.0 - 36.0 g/dL   RDW 12.9  11.5 - 15.5 %   Platelets 358  150 - 400 K/uL  URINALYSIS, ROUTINE W REFLEX MICROSCOPIC      Result Value Range   Color, Urine YELLOW  YELLOW   APPearance CLEAR  CLEAR   Specific Gravity, Urine 1.006  1.005 - 1.030   pH 7.0  5.0 - 8.0   Glucose, UA NEGATIVE  NEGATIVE mg/dL   Hgb urine dipstick NEGATIVE  NEGATIVE   Bilirubin Urine NEGATIVE  NEGATIVE   Ketones, ur NEGATIVE  NEGATIVE mg/dL   Protein, ur NEGATIVE  NEGATIVE mg/dL   Urobilinogen, UA 0.2  0.0 - 1.0 mg/dL   Nitrite NEGATIVE  NEGATIVE   Leukocytes, UA NEGATIVE  NEGATIVE  POCT I-STAT TROPONIN I      Result Value Range   Troponin i, poc 0.01  0.00 - 0.08 ng/mL   Comment 3            Dg Cervical Spine Complete  11/28/2012   *RADIOLOGY REPORT*  Clinical Data: 57 year old female with neck pain greater on the left. No known injury.  CERVICAL SPINE - COMPLETE 4+ VIEW  Comparison: None available.  Findings: Preserved cervical lordosis.  Normal prevertebral soft tissue contour.  Relatively preserved disc spaces. Very mild endplate spurring at B2-Q5 and C6-C7.  Bilateral posterior element alignment is within normal limits.  Rightward flexion of the neck on the AP view.  Otherwise normal AP alignment.  C1-C2 alignment and odontoid within normal limits.  Negative lung apices.  IMPRESSION: No acute osseous abnormality in the cervical spine.  Relatively preserved disc spaces.  Mild endplate spurring at O7-C0 and C6-C7.   Original Report Authenticated By: Roselyn Reef, M.D.   Dg Chest Portable 1 View  12/27/2012   *RADIOLOGY REPORT*  Clinical Data: Bradycardia  PORTABLE CHEST - 1 VIEW  Comparison: 06/30/2012  Findings: The heart and pulmonary vascularity are within normal limits.  The lungs are clear bilaterally.  No acute bony abnormality is seen.  IMPRESSION: No acute abnormality noted.   Original Report Authenticated By: Inez Catalina, M.D.     MDM  Ms. Riles has hx as noted above and presents with worsening lightheadedness and generalized weakness.  She states she has hx of bradycardia and had an episode of chest pain yesterday.  She denies other associated symptoms.  She has not chest pain on arrival.  She is followed by cardiology for the symptoms. Cardiology was called upon arrival. The plan is for patient to be admitted. We'll continue to monitor her vital signs; however, at this time atropine does not seem indicated. Blood glucose is okay. Do not suspect overdose of other medications as an etiology.  Leighton Parody, MD 12/27/12 1745

## 2012-12-27 NOTE — ED Notes (Signed)
Notified RN of CBG 101

## 2012-12-27 NOTE — ED Notes (Signed)
Per EMS pt driving, started to feel dizzy and weak, pulled over and called EMS. Pt found to be bradycardic in the 40's. Followed by Dr. Doylene Canard. Now reports left sided weakness to face. Stroke scale negative. CBG 113. IV L Hand 18G.

## 2012-12-27 NOTE — Progress Notes (Signed)
Report received from emergency dept RN, Hazle Nordmann RN

## 2012-12-27 NOTE — H&P (Signed)
[name] is an 57 y.o. female.   Chief Complaint: Dizziness HPI: 57 years old female while driving felt dizzy and weak so she pulled over. EMS found her bradycardic in the 40's. Also has left sided headache. No fever or chest pain or cough or speech problem or vision disturbance.  Past Medical History  Diagnosis Date  . Angina   . GERD (gastroesophageal reflux disease)   . Arthritis   . Anxiety   . Fibromyalgia   . Headache(784.0)     sinus  . Shortness of breath     every day - either at rest or exertion  . Bursitis     right hip  . Anal fissure 02/2012  . Hypertension     under control, has been on med. since 2010  . Tarsal tunnel syndrome of left side   . Complication of anesthesia     difficult to wake up post-op  . Depression   . Asthma     daily and prn inhalers  . Irritable bowel syndrome (IBS)   . Cardiomegaly   . TMJ (temporomandibular joint syndrome)     left  . Dental crowns present   . Wears partial dentures     upper and lower      Past Surgical History  Procedure Laterality Date  . Shoulder open rotator cuff repair  1999    left  . Abdominal hysterectomy      partial  . Bilateral oophorectomy  04/2011  . Cystoscopy w/ ureteral stent placement  08/04/2009  . Laparoscopic lysis intestinal adhesions  08/04/2009  . Cardiac catheterization  10/08/2007  . Examination under anesthesia  02/17/2012    Procedure: EXAM UNDER ANESTHESIA;  Surgeon: Pedro Earls, MD;  Location: [facility];  Service: General;  Laterality: N/A;  Exam under anesthesia with lateral internal sphincterotomy  . Sphincterotomy  02/17/2012    Procedure: SPHINCTEROTOMY;  Surgeon: Pedro Earls, MD;  Location: [facility];  Service: General;  Laterality: N/A;    Family History  Problem Relation Age of Onset  . Cancer Mother     liver  . Cancer Maternal Grandmother     breast  . Anesthesia problems Sister     hard to wake up post-op   Social History:   reports that she quit smoking about 6 months ago. Her smoking use included Cigarettes. She has a 15 pack-year smoking history. She has never used smokeless tobacco. She reports that she does not drink alcohol or use illicit drugs.  Allergies:  Allergies  Allergen Reactions  . Codeine Other (See Comments)    HALLUCINATIONS   . Propoxyphene-Acetaminophen Other (See Comments)    HALLUCINATIONS  . Sulfonamide Derivatives Hives, Itching and Swelling  . Alka-Seltzer (Aspirin Effervescent) Other (See Comments)    "like she's fading away"  . Iohexol Nausea Only    JITTERY    . Latex Hives and Itching     (Not in a hospital admission)  Results for orders placed during the hospital encounter of 12/27/12 (from the past 48 hour(s))  GLUCOSE, CAPILLARY     Status: Abnormal   Collection Time    12/27/12  2:34 PM      Result Value Range   Glucose-Capillary 101 (*) 70 - 99 mg/dL  BASIC METABOLIC PANEL     Status: None   Collection Time    12/27/12  3:08 PM      Result Value Range   Sodium 141  135 - 145 mEq/L   Potassium 4.1  3.5 - 5.1 mEq/L   Chloride 106  96 - 112 mEq/L   CO2 25  19 - 32 mEq/L   Glucose, Bld 94  70 - 99 mg/dL   BUN 13  6 - 23 mg/dL   Creatinine, Ser 0.63  0.50 - 1.10 mg/dL   Calcium 9.6  8.4 - 10.5 mg/dL   GFR calc non Af Amer >90  >90 mL/min   GFR calc Af Amer >90  >90 mL/min   Comment:            The eGFR has been calculated     using the CKD EPI equation.     This calculation has not been     validated in all clinical     situations.     eGFR's persistently     <90 mL/min signify     possible Chronic Kidney Disease.  CBC     Status: None   Collection Time    12/27/12  3:08 PM      Result Value Range   WBC 7.1  4.0 - 10.5 K/uL   RBC 4.54  3.87 - 5.11 MIL/uL   Hemoglobin 12.5  12.0 - 15.0 g/dL   HCT 37.2  36.0 - 46.0 %   MCV 81.9  78.0 - 100.0 fL   MCH 27.5  26.0 - 34.0 pg   MCHC 33.6  30.0 - 36.0 g/dL   RDW 12.9  11.5 - 15.5 %   Platelets 358  150 -  400 K/uL  POCT I-STAT TROPONIN I     Status: None   Collection Time    12/27/12  3:20 PM      Result Value Range   Troponin i, poc 0.01  0.00 - 0.08 ng/mL   Comment 3            Comment: Due to the release kinetics of cTnI,     a negative result within the first hours     of the onset of symptoms does not rule out     myocardial infarction with certainty.     If myocardial infarction is still suspected,     repeat the test at appropriate intervals.  URINALYSIS, ROUTINE W REFLEX MICROSCOPIC     Status: None   Collection Time    12/27/12  3:35 PM      Result Value Range   Color, Urine YELLOW  YELLOW   APPearance CLEAR  CLEAR   Specific Gravity, Urine 1.006  1.005 - 1.030   pH 7.0  5.0 - 8.0   Glucose, UA NEGATIVE  NEGATIVE mg/dL   Hgb urine dipstick NEGATIVE  NEGATIVE   Bilirubin Urine NEGATIVE  NEGATIVE   Ketones, ur NEGATIVE  NEGATIVE mg/dL   Protein, ur NEGATIVE  NEGATIVE mg/dL   Urobilinogen, UA 0.2  0.0 - 1.0 mg/dL   Nitrite NEGATIVE  NEGATIVE   Leukocytes, UA NEGATIVE  NEGATIVE   Comment: MICROSCOPIC NOT DONE ON URINES WITH NEGATIVE PROTEIN, BLOOD, LEUKOCYTES, NITRITE, OR GLUCOSE <1000 mg/dL.   Dg Chest Portable 1 View  12/27/2012   *RADIOLOGY REPORT*  Clinical Data: Bradycardia  PORTABLE CHEST - 1 VIEW  Comparison: 06/30/2012  Findings: The heart and pulmonary vascularity are within normal limits.  The lungs are clear bilaterally.  No acute bony abnormality is seen.  IMPRESSION: No acute abnormality noted.   Original Report Authenticated By: Inez Catalina, M.D.    @ROS @ No weight gain/loss, +  asthma, + fibromyalgia, + anxiety, + GI bleed, + Eczema/Psoriasis, + IBS, + Joint pain, No CVA, No Seizures.  Blood pressure 130/68, pulse 47, temperature 97.8 F (36.6 C), temperature source Oral, resp. rate 14, SpO2 99.00%.  GENERAL: Well-developed, well-nourished female in no acute distress.  HEENT: Normocephalic, atraumatic. Brown eyes, Pupils equal, round, reactive to light  accommodation. Extraocular movements intact. Oral mucosa is pink and moist.  NECK: Is supple.  RESPIRATORY: Clear on auscultation bilaterally.  CARDIAC: Regular rate and rhythm.  ABDOMEN: Bowel sounds x 4. Soft, tender on palpation.  EXTREMITIES: No edema, cyanosis or clubbing noted.  NEUROLOGIC: Cranial nerves II-XII intact. Patient is alert, oriented x3. Gait not checked.  Assessment/Plan Dizziness Sinus bradycardia Anxiety Fibromyalgia IBS  Hold calcium channel blocker/Echocardiogram/Monitor  Qiana Landgrebe S 12/27/2012, 4:25 PM

## 2012-12-28 LAB — CBC
HCT: 37 % (ref 36.0–46.0)
Hemoglobin: 12 g/dL (ref 12.0–15.0)
WBC: 5.9 10*3/uL (ref 4.0–10.5)

## 2012-12-28 LAB — BASIC METABOLIC PANEL
Chloride: 103 mEq/L (ref 96–112)
GFR calc Af Amer: >90 mL/min (ref >90)
GFR calc non Af Amer: >90 mL/min (ref >90)
Potassium: 3.8 mEq/L (ref 3.5–5.1)
Sodium: 138 mEq/L (ref 135–145)

## 2012-12-28 MED ORDER — SODIUM CHLORIDE 0.45 % IV BOLUS
250.0000 mL | Freq: Once | INTRAVENOUS | Status: AC
  Administered 2012-12-28: 250 mL via INTRAVENOUS

## 2012-12-28 MED ORDER — DEXTROSE-NACL 5-0.45 % IV SOLN: INTRAVENOUS | Status: DC

## 2012-12-28 MED ORDER — SODIUM CHLORIDE 0.45 % IV SOLN: Freq: Once | INTRAVENOUS | Status: DC

## 2012-12-28 NOTE — Progress Notes (Signed)
Utilization review completed. Zachari Alberta, RN, BSN. 

## 2012-12-28 NOTE — Progress Notes (Signed)
Nutrition Brief Note  Patient identified on the Malnutrition Screening Tool (MST) Report with a score of 3.   Body mass index is 24.74 kg/(m^2). Patient meets criteria for normal weight based on current BMI.   Current diet order is Heart Healthy, patient is consuming approximately 100% of meals at this time. Labs and medications reviewed.   Per chart review and patient, no significant weight loss.   No nutrition interventions warranted at this time. If nutrition issues arise, please consult RD.   Larey Seat, RD, LDN Pager #: [phone] After-Hours Pager #: [phone]

## 2012-12-28 NOTE — Progress Notes (Signed)
  Echocardiogram 2D Echocardiogram has been performed.  Mauricio Po 12/28/2012, 9:34 AM
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
## 2013-01-01 ENCOUNTER — Ambulatory Visit: Payer: 59 | Admitting: Professional

## 2013-01-08 ENCOUNTER — Ambulatory Visit: Payer: 59 | Admitting: Professional

## 2013-01-13 ENCOUNTER — Encounter: Payer: Self-pay | Admitting: Physician Assistant

## 2013-01-15 ENCOUNTER — Other Ambulatory Visit: Payer: Self-pay | Admitting: Physician Assistant

## 2013-01-15 ENCOUNTER — Ambulatory Visit (INDEPENDENT_AMBULATORY_CARE_PROVIDER_SITE_OTHER): Payer: 59 | Admitting: Professional

## 2013-01-15 DIAGNOSIS — F331 Major depressive disorder, recurrent, moderate: Secondary | ICD-10-CM

## 2013-01-15 DIAGNOSIS — L409 Psoriasis, unspecified: Secondary | ICD-10-CM

## 2013-01-15 MED ORDER — CALCIPOTRIENE-BETAMETH DIPROP 0.005-0.064 % EX OINT: TOPICAL_OINTMENT | Freq: Every day | CUTANEOUS | Status: DC

## 2013-01-29 ENCOUNTER — Encounter: Payer: Self-pay | Admitting: Physician Assistant

## 2013-01-29 ENCOUNTER — Ambulatory Visit (INDEPENDENT_AMBULATORY_CARE_PROVIDER_SITE_OTHER): Payer: Medicare Other | Admitting: Physician Assistant

## 2013-01-29 VITALS — BP 127/76 | HR 46 | Temp 97.7°F | Resp 16 | Ht 67.0 in | Wt 154.0 lb

## 2013-01-29 DIAGNOSIS — E785 Hyperlipidemia, unspecified: Secondary | ICD-10-CM

## 2013-01-29 DIAGNOSIS — IMO0001 Reserved for inherently not codable concepts without codable children: Secondary | ICD-10-CM

## 2013-01-29 DIAGNOSIS — J329 Chronic sinusitis, unspecified: Secondary | ICD-10-CM

## 2013-01-29 MED ORDER — DIAZEPAM 5 MG PO TABS: 5.0000 mg | ORAL_TABLET | Freq: Every day | ORAL | Status: DC

## 2013-01-29 MED ORDER — IPRATROPIUM BROMIDE 0.03 % NA SOLN: 2.0000 | Freq: Two times a day (BID) | NASAL | Status: DC

## 2013-01-29 MED ORDER — AMOXICILLIN-POT CLAVULANATE 875-125 MG PO TABS: 1.0000 | ORAL_TABLET | Freq: Two times a day (BID) | ORAL | Status: DC

## 2013-01-29 NOTE — Patient Instructions (Signed)
Use the Flonase EVERY DAY to prevent sinus infections.  When the congestion builds up, add back the Atrovent (ipratropium) until it improves.
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
## 2013-02-05 ENCOUNTER — Ambulatory Visit: Payer: 59 | Admitting: Professional

## 2013-02-11 ENCOUNTER — Ambulatory Visit: Payer: Self-pay | Admitting: *Deleted

## 2013-02-11 ENCOUNTER — Other Ambulatory Visit: Payer: Self-pay | Admitting: Pain Medicine

## 2013-02-11 DIAGNOSIS — M542 Cervicalgia: Secondary | ICD-10-CM

## 2013-02-12 ENCOUNTER — Ambulatory Visit (INDEPENDENT_AMBULATORY_CARE_PROVIDER_SITE_OTHER): Payer: 59 | Admitting: Professional

## 2013-02-12 DIAGNOSIS — F411 Generalized anxiety disorder: Secondary | ICD-10-CM

## 2013-02-19 ENCOUNTER — Ambulatory Visit
Admission: RE | Admit: 2013-02-19 | Discharge: 2013-02-19 | Disposition: A | Discharge status: Home / Self Care | Payer: Medicare Other | Source: Ambulatory Visit | Attending: Pain Medicine | Admitting: Pain Medicine

## 2013-02-19 DIAGNOSIS — M542 Cervicalgia: Secondary | ICD-10-CM

## 2013-02-28 ENCOUNTER — Ambulatory Visit: Payer: Self-pay | Admitting: *Deleted

## 2013-03-05 ENCOUNTER — Ambulatory Visit (INDEPENDENT_AMBULATORY_CARE_PROVIDER_SITE_OTHER): Payer: 59 | Admitting: Professional

## 2013-03-05 DIAGNOSIS — F411 Generalized anxiety disorder: Secondary | ICD-10-CM

## 2013-03-06 ENCOUNTER — Other Ambulatory Visit: Payer: Self-pay | Admitting: Gynecology

## 2013-03-11 ENCOUNTER — Ambulatory Visit: Payer: Self-pay | Admitting: *Deleted

## 2013-03-11 ENCOUNTER — Encounter: Payer: Self-pay | Admitting: Physician Assistant

## 2013-03-11 DIAGNOSIS — Z23 Encounter for immunization: Secondary | ICD-10-CM

## 2013-04-11 ENCOUNTER — Other Ambulatory Visit: Payer: Self-pay

## 2013-04-16 ENCOUNTER — Ambulatory Visit: Payer: Medicare Other

## 2013-04-16 ENCOUNTER — Ambulatory Visit (INDEPENDENT_AMBULATORY_CARE_PROVIDER_SITE_OTHER): Payer: Medicare Other | Admitting: Physician Assistant

## 2013-04-16 VITALS — BP 136/76 | HR 45 | Temp 98.0°F | Resp 16 | Ht 67.0 in | Wt 155.0 lb

## 2013-04-16 DIAGNOSIS — M25559 Pain in unspecified hip: Secondary | ICD-10-CM

## 2013-04-16 DIAGNOSIS — M25551 Pain in right hip: Secondary | ICD-10-CM

## 2013-04-16 MED ORDER — TRIAMCINOLONE ACETONIDE 40 MG/ML IJ SUSP
40.0000 mg | Freq: Once | INTRAMUSCULAR | Status: AC
  Administered 2013-04-16: 40 mg via INTRAMUSCULAR

## 2013-04-16 NOTE — Progress Notes (Signed)
  Subjective:    Patient ID: [name], female    DOB: 1955-10-25, 57 y.o.   MRN: [id number]  HPI "I believe I have bursitis in my hip." Diagnosed with bursitis several years ago, but resolved for the most part until recently. Bothering her for 4 weeks, and "eating pills" to alleviate her pain.  Yesterday picked turnip and collard greens, which really exacerbated her symptoms. No loss of bowel/bladder control.  No radicular pain into the leg.  No paresthesias. A friend at church had similar symptoms that resolved with an injection.  Medications, allergies, past medical history, surgical history, family history, social history and problem list reviewed.    Review of Systems As above.  She has her usual aches and pains from fibromyalgia, but no recent CP, SOB, HA, dizziness.  No nausea,vomiting or diarrhea.    Objective:   Physical Exam  Vitals reviewed. Constitutional: She is oriented to person, place, and time. She appears well-developed and well-nourished. No distress.  Eyes: Conjunctivae are normal.  Cardiovascular: Normal rate, regular rhythm and normal heart sounds.   Pulmonary/Chest: Effort normal and breath sounds normal.  Musculoskeletal:       Right hip: She exhibits decreased range of motion (due to pain, but she is able to change clothes and bend down to obtain a dropped object from the floor) and tenderness. She exhibits normal strength, no swelling, no crepitus, no deformity and no laceration.  Tenderness of the RIGHT hip area in general, but point of maximum tenderness is over the greater trochanter.  Neurological: She is alert and oriented to person, place, and time. She has normal strength and normal reflexes. No sensory deficit.   [facility] reading (PRIMARY) by  Dr. Brigitte Pulse.  Normal RIGHT hip.  PROCEDURE: RIGHT trochanteric bursa injection Verbal consent obtained. Point of maximum tenderness identified and cleansed with alcohol. 22 gauge 1.5 inch needle to inject 5 cc 1%  lidocaine mixed with 1 ml Kenalog (40 mg/ml). Bandaid applied.     Assessment & Plan:  Right hip pain - Plan: DG Hip Complete Right, triamcinolone acetonide (KENALOG-40) injection 40 mg  Ice, rest, NSAIDS.  Anticipatory guidance.  RTC next month for routine visit as planned. Discussed with Dr. Brigitte Pulse.  Fara Chute, PA-C Physician Assistant-Certified [facility]

## 2013-04-16 NOTE — Patient Instructions (Signed)
Rest today and tomorrow. Put an ice pack on the area this evening.

## 2013-04-24 ENCOUNTER — Encounter: Payer: Self-pay | Admitting: Physician Assistant

## 2013-04-24 DIAGNOSIS — IMO0001 Reserved for inherently not codable concepts without codable children: Secondary | ICD-10-CM

## 2013-05-21 ENCOUNTER — Ambulatory Visit: Payer: Medicare Other | Admitting: Professional

## 2013-05-21 ENCOUNTER — Encounter: Payer: Self-pay | Admitting: Physician Assistant

## 2013-05-21 ENCOUNTER — Ambulatory Visit (INDEPENDENT_AMBULATORY_CARE_PROVIDER_SITE_OTHER): Payer: Medicare Other | Admitting: Physician Assistant

## 2013-05-21 VITALS — BP 130/92 | HR 50 | Temp 99.0°F | Resp 16 | Ht 67.0 in | Wt 154.0 lb

## 2013-05-21 DIAGNOSIS — R7309 Other abnormal glucose: Secondary | ICD-10-CM

## 2013-05-21 DIAGNOSIS — F5104 Psychophysiologic insomnia: Secondary | ICD-10-CM

## 2013-05-21 DIAGNOSIS — F411 Generalized anxiety disorder: Secondary | ICD-10-CM

## 2013-05-21 DIAGNOSIS — E785 Hyperlipidemia, unspecified: Secondary | ICD-10-CM

## 2013-05-21 DIAGNOSIS — R739 Hyperglycemia, unspecified: Secondary | ICD-10-CM

## 2013-05-21 DIAGNOSIS — G4700 Insomnia, unspecified: Secondary | ICD-10-CM

## 2013-05-21 DIAGNOSIS — I10 Essential (primary) hypertension: Secondary | ICD-10-CM

## 2013-05-21 DIAGNOSIS — J0100 Acute maxillary sinusitis, unspecified: Secondary | ICD-10-CM

## 2013-05-21 DIAGNOSIS — J45909 Unspecified asthma, uncomplicated: Secondary | ICD-10-CM

## 2013-05-21 LAB — PULMONARY FUNCTION TEST

## 2013-05-21 LAB — GLUCOSE, POCT (MANUAL RESULT ENTRY): POC Glucose: 95 mg/dl (ref 70–99)

## 2013-05-21 MED ORDER — ALPRAZOLAM 1 MG PO TABS: 0.5000 mg | ORAL_TABLET | Freq: Every evening | ORAL | Status: DC | PRN

## 2013-05-21 MED ORDER — ZOLPIDEM TARTRATE ER 12.5 MG PO TBCR: 12.5000 mg | EXTENDED_RELEASE_TABLET | Freq: Every evening | ORAL | Status: DC | PRN

## 2013-05-21 MED ORDER — AMOXICILLIN-POT CLAVULANATE 875-125 MG PO TABS: 1.0000 | ORAL_TABLET | Freq: Two times a day (BID) | ORAL | Status: AC

## 2013-05-21 NOTE — Patient Instructions (Signed)
Please take the whole citalopram each day.

## 2013-05-21 NOTE — Progress Notes (Signed)
Subjective:    Patient ID: [name], female    DOB: Jul 17, 1955, 57 y.o.   MRN: [id number]   PCP: Valon Glasscock, PA-C  Chief Complaint  Patient presents with  . Bloodwork  . Sinusitis    x1wk  . Anxiety   Medications, allergies, past medical history, surgical history, family history, social history and problem list reviewed and updated.  HPI Due for labs to update lipids and recheck hyperglycemia.  Has switched from sugar to Splenda as a sweetener. Trying to make healthy eating choices.  Took care of her granddaughter last week, who had strep throat. Now complains of increased sinus congestion and generalized malaise.  Has a low grade fever. "I feel bad." Pain in the LEFT maxillary sinus area, and restarted Flonase.  Had a foot injection last week, and is having more pain in it than usual.  Still has some bruising.  Was advised to elevate and ice it.  Saw Dr. Estanislado Pandy yesterday.  Refused an injection.  Reports increased facial hair growth.  "I look like a cave woman."  Discussed insomnia, and was prescribed Ambien.  Taking 10 mg, she can sleep until about 12 midnight.  Taking "a whole pill and a piece" lasts until 3-4am. I note that there is notation on her medication list that she only takes 1/2 tablet of citalopram. She reports that she had a bad episode after missing 3 days of it (felt homicidal, possessed by the devil) and so doesn't want to take it. She feels like taking a half tablet daily, most days, is better than taking the whole tablet every day. "I feel like I'm going to bust out crying." "I can't do what I want to do.  I don't get enough sleep.  And then when I do, I have foot pain.  Or hip pain. Or pain somewhere else."  Wants to travel and volunteer.  Review of Systems Hurts everywhere.  No CP, dizziness.  Some nausea at night.    Objective:   Physical Exam  Blood pressure 130/92, pulse 50, temperature 99 F (37.2 C), temperature source Oral, resp. rate 16, height  5' 7"  (1.702 m), weight 154 lb (69.854 kg), SpO2 98.00%. Body mass index is 24.11 kg/(m^2). Well-developed, well nourished BF who is awake, alert and oriented, in NAD. HEENT: [place]/AT, sclera and conjunctiva are clear.  EAC are patent, TMs are normal in appearance. Nasal mucosa is pink and moist. OP is clear. Exquisite tenderness of the frontal and maxillary sinuses, worst over the LEFT maxillary. Neck: supple, non-tender, no lymphadenopathy, thyromegaly. Heart: RRR, no murmur Lungs: normal effort, CTA Extremities: no cyanosis, clubbing or edema. Skin: warm and dry without rash. Psychologic: depressed mood and sad affect, normal speech and behavior. Tearful.   Results for orders placed in visit on 05/21/13  GLUCOSE, POCT (MANUAL RESULT ENTRY)      Result Value Range   POC Glucose 95  70 - 99 mg/dl  POCT GLYCOSYLATED HEMOGLOBIN (HGB A1C)      Result Value Range   Hemoglobin A1C 5.7         Assessment & Plan:  1. Unspecified essential hypertension Mostly stable.  DBP elevated today, but she's not feeling well, so elect not to adjust treatment today. - COMPLETE METABOLIC PANEL WITH GFR  2. Hyperglycemia Controlled with healthy lifestyle modifications - POCT glucose (manual entry) - POCT glycosylated hemoglobin (Hb A1C) - Microalbumin, urine  3. Extrinsic asthma, unspecified Spirometry today reveals restriction, but may not be accurate given  her acute illness. Once she's afebrile, she'll get a flu vaccine. - Spirometry with graph; Future  4. Hyperlipidemia Await labs.  Continue healthy lifestyle modification. -Lipid  5. Sinusitis, acute maxillary Continue flonase. Rest, fluids. - amoxicillin-clavulanate (AUGMENTIN) 875-125 MG per tablet; Take 1 tablet by mouth 2 (two) times daily.  Dispense: 20 tablet; Refill: 0  6. Chronic insomnia D/C Ambien 10 mg and try CR product.  Needs improved GAD treatment/compliance - zolpidem (AMBIEN CR) 12.5 MG CR tablet; Take 1 tablet (12.5 mg  total) by mouth at bedtime as needed for sleep.  Dispense: 30 tablet; Refill: 0 - ALPRAZolam (XANAX) 1 MG tablet; Take 0.5-1 tablets (0.5-1 mg total) by mouth at bedtime as needed for sleep or anxiety.  Dispense: 30 tablet; Refill: 0  7. ANXIETY Encouraged to take the whole citalopram tablet every day.  Consider switching to another product, or consider adding Buspar. - ALPRAZolam (XANAX) 1 MG tablet; Take 0.5-1 tablets (0.5-1 mg total) by mouth at bedtime as needed for sleep or anxiety.  Dispense: 30 tablet; Refill: 0  Return in about 1 month (around 06/21/2013) for re-evaluation of insomnia and anxiety.  Fara Chute, PA-C Physician Assistant-Certified [facility]

## 2013-05-22 LAB — COMPLETE METABOLIC PANEL WITH GFR
AST: 14 U/L (ref 0–37)
Albumin: 4.5 g/dL (ref 3.5–5.2)
BUN: 10 mg/dL (ref 6–23)
Calcium: 9.8 mg/dL (ref 8.4–10.5)
Chloride: 103 mEq/L (ref 96–112)
GFR, Est Non African American: >89 mL/min
Glucose, Bld: 86 mg/dL (ref 70–99)
Potassium: 4.3 mEq/L (ref 3.5–5.3)
Sodium: 140 mEq/L (ref 135–145)
Total Protein: 7.4 g/dL (ref 6.0–8.3)

## 2013-05-22 LAB — LIPID PANEL
HDL: 48 mg/dL (ref >39)
LDL Cholesterol: 106 mg/dL — ABNORMAL HIGH (ref 0–99)
Total CHOL/HDL Ratio: 3.4 Ratio
VLDL: 7 mg/dL (ref 0–40)

## 2013-05-24 ENCOUNTER — Encounter: Payer: Self-pay | Admitting: Physician Assistant

## 2013-05-27 ENCOUNTER — Encounter: Payer: Self-pay | Admitting: Physician Assistant

## 2013-05-27 DIAGNOSIS — IMO0001 Reserved for inherently not codable concepts without codable children: Secondary | ICD-10-CM

## 2013-05-28 ENCOUNTER — Encounter: Payer: Self-pay | Admitting: Physician Assistant

## 2013-05-28 ENCOUNTER — Ambulatory Visit (INDEPENDENT_AMBULATORY_CARE_PROVIDER_SITE_OTHER): Payer: 59 | Admitting: Professional

## 2013-05-28 DIAGNOSIS — IMO0001 Reserved for inherently not codable concepts without codable children: Secondary | ICD-10-CM

## 2013-05-28 DIAGNOSIS — F411 Generalized anxiety disorder: Secondary | ICD-10-CM

## 2013-06-18 ENCOUNTER — Ambulatory Visit (INDEPENDENT_AMBULATORY_CARE_PROVIDER_SITE_OTHER): Payer: Medicare Other | Admitting: Physician Assistant

## 2013-06-18 ENCOUNTER — Encounter: Payer: Self-pay | Admitting: Physician Assistant

## 2013-06-18 VITALS — BP 133/69 | HR 46 | Temp 98.3°F | Resp 16 | Ht 66.75 in | Wt 158.4 lb

## 2013-06-18 DIAGNOSIS — F5104 Psychophysiologic insomnia: Secondary | ICD-10-CM

## 2013-06-18 DIAGNOSIS — J309 Allergic rhinitis, unspecified: Secondary | ICD-10-CM

## 2013-06-18 DIAGNOSIS — G4700 Insomnia, unspecified: Secondary | ICD-10-CM

## 2013-06-18 DIAGNOSIS — F411 Generalized anxiety disorder: Secondary | ICD-10-CM
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
## 2013-06-18 NOTE — Patient Instructions (Signed)
Work on [employer], limiting your sugar intake. Use the Flonase EVERY DAY.  Add the Atrovent when you are feeling congestion. If you aren't seeing improvement in your sinus symptoms, we'll add Singulair.

## 2013-06-19 ENCOUNTER — Encounter: Payer: Self-pay | Admitting: Physician Assistant

## 2013-06-20 MED ORDER — MONTELUKAST SODIUM 10 MG PO TABS: 10.0000 mg | ORAL_TABLET | Freq: Every day | ORAL | Status: DC

## 2013-06-25 ENCOUNTER — Ambulatory Visit (INDEPENDENT_AMBULATORY_CARE_PROVIDER_SITE_OTHER): Payer: 59 | Admitting: Professional

## 2013-06-25 ENCOUNTER — Encounter: Payer: Self-pay | Admitting: Physician Assistant

## 2013-06-25 DIAGNOSIS — F411 Generalized anxiety disorder: Secondary | ICD-10-CM

## 2013-06-25 MED ORDER — AMOXICILLIN-POT CLAVULANATE 875-125 MG PO TABS: 1.0000 | ORAL_TABLET | Freq: Two times a day (BID) | ORAL | Status: AC

## 2013-06-25 NOTE — Telephone Encounter (Signed)
Meds ordered this encounter  Medications  . amoxicillin-clavulanate (AUGMENTIN) 875-125 MG per tablet    Sig: Take 1 tablet by mouth 2 (two) times daily.    Dispense:  20 tablet    Refill:  0    Order Specific Question:  Supervising Provider    Answer:  DOOLITTLE, ROBERT P [[id number]]

## 2013-07-05 ENCOUNTER — Ambulatory Visit (INDEPENDENT_AMBULATORY_CARE_PROVIDER_SITE_OTHER): Payer: Medicare Other | Admitting: Family Medicine

## 2013-07-05 VITALS — BP 142/92 | HR 53 | Temp 98.5°F | Resp 17 | Ht 67.5 in | Wt 154.0 lb

## 2013-07-05 DIAGNOSIS — G4700 Insomnia, unspecified: Secondary | ICD-10-CM

## 2013-07-05 DIAGNOSIS — B370 Candidal stomatitis: Secondary | ICD-10-CM

## 2013-07-05 MED ORDER — FIRST-DUKES MOUTHWASH MT SUSP: 5.0000 mL | Freq: Four times a day (QID) | OROMUCOSAL | Status: DC

## 2013-07-05 NOTE — Progress Notes (Signed)
Chief Complaint:  Chief Complaint  Patient presents with  . Rash    tongue rash , is having trouble swallowing  . Sore Throat  . Fever    HPI: [name] is a 58 y.o. female who is here for :  1. She is here and she states that the alprazolam that  Was given to help her with sleep was causing her to be forget. She was then given Ambien. BUt it also has the same problems of making her forget. She is worried about "dementia and alzhemiers, she is 41 and has enough problems and does not want any of that ". She has tried [place] was still waking up at  1,3 4 5  am and then was given Ambien CR to see if that helps. It has not helped her sleep. And also is on celexa. She has not tried trazadone in the past. She is followed by cardiologist   2. She was on Augmentin for sinus and she now has rash on her mouth. Started this week. She wants to know if she needs another medication.   Past Medical History  Diagnosis Date  . Angina   . GERD (gastroesophageal reflux disease)   . Arthritis   . Anxiety   . Fibromyalgia   . Bursitis     right hip  . Anal fissure 02/2012  . Hypertension     under control, has been on med. since 2010  . Tarsal tunnel syndrome of left side   . Complication of anesthesia     difficult to wake up post-op  . Depression   . Asthma     daily and prn inhalers  . Irritable bowel syndrome (IBS)   . Cardiomegaly   . TMJ (temporomandibular joint syndrome)     left  . Dental crowns present   . Wears partial dentures     upper and lower  . High cholesterol   . Pneumonia ~ 06/2012  . Chronic bronchitis     "get it about q year" (12/27/2012)  . Shortness of breath     "none since I quit smoking in 06/2012" (12/27/2012)  . Sinus headache     "@ times" (12/27/2012)  . Tarsal tunnel syndrome of left side   . Hepatitis A infection 1979   Past Surgical History  Procedure Laterality Date  . Shoulder arthroscopy w/ rotator cuff repair Left 1999  . Bilateral  salpingoophorectomy Bilateral 04/2011  . Cystoscopy w/ ureteral stent placement  08/04/2009  . Laparoscopic lysis intestinal adhesions  08/04/2009  . Cardiac catheterization  10/08/2007  . Examination under anesthesia  02/17/2012    Procedure: EXAM UNDER ANESTHESIA;  Surgeon: Pedro Earls, MD;  Location: [facility];  Service: General;  Laterality: N/A;  Exam under anesthesia with lateral internal sphincterotomy  . Sphincterotomy  02/17/2012    Procedure: SPHINCTEROTOMY;  Surgeon: Pedro Earls, MD;  Location: [facility];  Service: General;  Laterality: N/A;  . Abdominal hysterectomy  09/1994    partial   History   Social History  . Marital Status: Divorced    Spouse Name: n/a    Number of Children: 1  . Years of Education: 17   Occupational History  . Disabled     Fibromyalgia, Depression, Anxiety   Social History Main Topics  . Smoking status: Former Smoker -- 0.50 packs/day for 37 years    Types: Cigarettes    Quit date: 06/05/2012  .  Smokeless tobacco: Never Used  . Alcohol Use: No  . Drug Use: No  . Sexual Activity: Not Currently    Birth Control/ Protection: Post-menopausal   Other Topics Concern  . None   Social History Narrative   Lives alone.  Daughter and granddaughter live in [place], [place].   Family History  Problem Relation Age of Onset  . Cancer Mother     liver  . Cancer Maternal Grandmother     breast  . Anesthesia problems Sister     hard to wake up post-op   Allergies  Allergen Reactions  . Codeine Other (See Comments)    HALLUCINATIONS   . Propoxyphene N-Acetaminophen Other (See Comments)    HALLUCINATIONS  . Sulfonamide Derivatives Hives, Itching and Swelling  . Alka-Seltzer [Aspirin Effervescent] Other (See Comments)    "like she's fading away"  . Norvasc [Amlodipine Besylate]   . Iohexol Nausea Only    JITTERY    . Latex Hives and Itching   Prior to Admission medications   Medication Sig Start Date End  Date Taking? Authorizing Provider  albuterol (PROVENTIL HFA;VENTOLIN HFA) 108 (90 BASE) MCG/ACT inhaler Inhale 2 puffs into the lungs every 6 (six) hours as needed for wheezing.   Yes Historical Provider, MD  ALPRAZolam Duanne Moron) 1 MG tablet Take 0.5-1 tablets (0.5-1 mg total) by mouth at bedtime as needed for sleep or anxiety. 05/21/13  Yes [name] S Jeffery, PA-C  calcipotriene-betamethasone (TACLONEX) ointment Apply topically daily. For psoriasis 01/15/13  Yes [name] S Jeffery, PA-C  citalopram (CELEXA) 20 MG tablet Take 1 tablet (20 mg total) by mouth daily. 09/25/12  Yes [name] S Jeffery, PA-C  dexlansoprazole (DEXILANT) 60 MG capsule Take 60 mg by mouth daily as needed (for acid reflux).   Yes Historical Provider, MD  diazepam (VALIUM) 5 MG tablet Take 1 tablet (5 mg total) by mouth daily. 01/29/13  Yes [name] S Jeffery, PA-C  dicyclomine (BENTYL) 10 MG capsule Take 10 mg by mouth 3 (three) times daily as needed (for stomach).    Yes Historical Provider, MD  docusate sodium (COLACE) 100 MG capsule Take 100 mg by mouth daily.    Yes Historical Provider, MD  fenofibrate 160 MG tablet Take 1 tablet (160 mg total) by mouth daily. 09/25/12  Yes [name] S Jeffery, PA-C  fluticasone (FLONASE) 50 MCG/ACT nasal spray Place 2 sprays into the nose daily. 08/15/12  Yes Posey Boyer, MD  ibuprofen (ADVIL,MOTRIN) 800 MG tablet Take 800 mg by mouth every 6 (six) hours as needed.  08/04/12  Yes Historical Provider, MD  ipratropium (ATROVENT) 0.03 % nasal spray Place 2 sprays into the nose 2 (two) times daily. As needed 01/29/13  Yes [name] S Jeffery, PA-C  Linaclotide (LINZESS) 145 MCG CAPS Take 145 mcg by mouth daily.   Yes Historical Provider, MD  metaxalone (SKELAXIN) 800 MG tablet Take 400-800 mg by mouth daily as needed for pain (for muscle spasms).    Yes Historical Provider, MD  Multiple Vitamin (MULTIVITAMIN) tablet Take 1 tablet by mouth daily.   Yes Historical Provider, MD  NITROSTAT 0.4 MG SL tablet Place 0.4  mg under the tongue every 5 (five) minutes as needed for chest pain. For chest pain. 11/24/11  Yes Historical Provider, MD  oxyCODONE-acetaminophen (PERCOCET) 5-325 MG per tablet Take 1 tablet by mouth every 8 (eight) hours as needed for pain.    Yes Historical Provider, MD  Probiotic Product (PROBIOTIC DAILY) CAPS Take 1 capsule by mouth 2 (two) times  a week.   Yes Historical Provider, MD  ranitidine (ZANTAC) 150 MG tablet Take 150 mg by mouth as needed for heartburn.    Yes Historical Provider, MD  zolpidem (AMBIEN CR) 12.5 MG CR tablet Take 1 tablet (12.5 mg total) by mouth at bedtime as needed for sleep. 05/21/13  Yes [name] S Jeffery, PA-C     ROS: The patient denies fevers, chills, night sweats, unintentional weight loss, chest pain, palpitations, wheezing, dyspnea on exertion, nausea, vomiting, abdominal pain, dysuria, hematuria, melena, numbness, weakness, or tingling.   All other systems have been reviewed and were otherwise negative with the exception of those mentioned in the HPI and as above.    PHYSICAL EXAM: Filed Vitals:   07/05/13 1042  BP: 142/92  Pulse: 53  Temp: 98.5 F (36.9 C)  Resp: 17   Filed Vitals:   07/05/13 1042  Height: 5' 7.5" (1.715 m)  Weight: 154 lb (69.854 kg)   Body mass index is 23.75 kg/(m^2).  General: Alert, no acute distress HEENT:  Normocephalic, atraumatic, oropharynx patent. EOMI, PERRLA Cardiovascular:  Regular rate and rhythm, no rubs murmurs or gallops.  No Carotid bruits, radial pulse intact. No pedal edema.  Respiratory: Clear to auscultation bilaterally.  No wheezes, rales, or rhonchi.  No cyanosis, no use of accessory musculature GI: No organomegaly, abdomen is soft and non-tender, positive bowel sounds.  No masses. Skin: No rashes. Neurologic: Facial musculature symmetric. Psychiatric: Patient is appropriate throughout our interaction. Lymphatic: No cervical lymphadenopathy Musculoskeletal: Gait intact.   LABS: Results for  orders placed in visit on 06/19/13  PULMONARY FUNCTION TEST      Result Value Range   FEV1       FVC       FEV1/FVC       TLC       DLCO         EKG/XRAY:   Primary read interpreted by Dr. Marin Comment at [facility].   ASSESSMENT/PLAN: Encounter Diagnoses  Name Primary?  Ritta Slot Yes  . Insomnia    Rx [facility] magic mouthwash Need to gargle after using any oral inhalers Advise to try melatonin for sleep issues We went through SE profile trazadone, a small dose ie trazodone 25 mg nightly prn maybe a good option for her. She would like me call her cardiologist before prescribing.  I reveiwed her old EKGs and she does have sinus brady and her OTC is on the upper limits of normal .  Advise her that I Can try but she also needs to call Dr Redmond School herself F/u prn  Gross sideeffects, risk and benefits, and alternatives of medications d/w patient. Patient is aware that all medications have potential sideeffects and we are unable to predict every sideeffect or drug-drug interaction that may occur.  Leotis Pain, DO 07/05/2013 12:16 PM   07/09/13 Called Dr WNIOEVOJ'J office to ask about ok to use low dose trazodone

## 2013-07-10 ENCOUNTER — Encounter: Payer: Self-pay | Admitting: Physician Assistant

## 2013-07-10 DIAGNOSIS — G4700 Insomnia, unspecified: Secondary | ICD-10-CM

## 2013-07-10 DIAGNOSIS — B370 Candidal stomatitis: Secondary | ICD-10-CM

## 2013-07-10 MED ORDER — TRAZODONE HCL 50 MG PO TABS: ORAL_TABLET | ORAL | Status: DC

## 2013-07-10 MED ORDER — FLUCONAZOLE 100 MG PO TABS: ORAL_TABLET | ORAL | Status: DC

## 2013-07-10 NOTE — Telephone Encounter (Signed)
Spoke with patient : will rx diflucan 100 mg loading dose of 200 mg and then 100 mg daily x 14 days for oral thrush. She has had diflucan before. I have instructed her to get reevaluated by Daphane Shepherd at next appt on 2/11 and if the thrush is gone after a 7 day course then she does not need  to finish it out. The recommendation is for 7-14 days  For oral thrush.  I also spoke with her cardiologist Dr. Doylene Canard and he says a small trial of trazadone is ok . I was thinking trazadone 25 mg daily. She is ok with it after I went through many of the SE profiles including confusion, memory loss, bradycardia and also seizures and increase suicidality etc.

## 2013-07-18 ENCOUNTER — Ambulatory Visit (INDEPENDENT_AMBULATORY_CARE_PROVIDER_SITE_OTHER): Payer: Medicare Other | Admitting: Physician Assistant

## 2013-07-18 VITALS — BP 129/79 | HR 47 | Temp 98.0°F | Resp 18 | Ht 67.0 in | Wt 157.0 lb

## 2013-07-18 DIAGNOSIS — IMO0001 Reserved for inherently not codable concepts without codable children: Secondary | ICD-10-CM

## 2013-07-18 DIAGNOSIS — F5104 Psychophysiologic insomnia: Secondary | ICD-10-CM

## 2013-07-18 DIAGNOSIS — F411 Generalized anxiety disorder: Secondary | ICD-10-CM

## 2013-07-18 DIAGNOSIS — F329 Major depressive disorder, single episode, unspecified: Secondary | ICD-10-CM

## 2013-07-18 DIAGNOSIS — F3289 Other specified depressive episodes: Secondary | ICD-10-CM

## 2013-07-18 DIAGNOSIS — B370 Candidal stomatitis: Secondary | ICD-10-CM

## 2013-07-18 DIAGNOSIS — G4700 Insomnia, unspecified: Secondary | ICD-10-CM

## 2013-07-18 MED ORDER — CITALOPRAM HYDROBROMIDE 20 MG PO TABS: 20.0000 mg | ORAL_TABLET | Freq: Every day | ORAL | Status: DC

## 2013-07-18 MED ORDER — DIAZEPAM 5 MG PO TABS: 5.0000 mg | ORAL_TABLET | Freq: Every day | ORAL | Status: DC

## 2013-07-18 MED ORDER — HYDROXYZINE HCL 25 MG PO TABS: 12.5000 mg | ORAL_TABLET | Freq: Every evening | ORAL | Status: DC | PRN

## 2013-07-18 NOTE — Progress Notes (Signed)
Subjective:    Patient ID: [name], female    DOB: July 13, 1955, 58 y.o.   MRN: [id number]  HPI  Patient presents for follow up of thrush in mouth. She also has a couple more complaints.   Reports that thrush has improved but can still feel it in the morning "in the way back of my throat".  She has been taking diflucan daily and still has a couple more days until completion of therapy.   Cannot take trazadone for insomnia because makes heart flutter. She has been taking xanax, valium and ambien in a rotating cycle. Reports valium works well for insomnia but valium and Lorrin Mais make her forget things which makes her angry.  She also needs refills on citalopram - she is taking it once a day regularly. Reports current dose is working well for her.    Patient reports sore throat that began 07/14/13. Denies any other URI symptoms.   See recently saw a dermatologist (Dr. Jarome Matin) for hair loss. He suggested that she use Rogaine but she declined treatment. She is supposed to follow up with him in 3 months.   Review of Systems As above.     Objective:   Physical Exam  Constitutional: She is oriented to person, place, and time. She appears well-developed and well-nourished.  HENT:  Head: Normocephalic and atraumatic.  Right Ear: External ear normal.  Left Ear: External ear normal.  Nose: Nose normal.  Mouth/Throat: Oropharynx is clear and moist.  Eyes: Conjunctivae are normal.  Neck: Neck supple.  Cardiovascular: Normal rate, regular rhythm and normal heart sounds.   Pulmonary/Chest: Effort normal and breath sounds normal.  Lymphadenopathy:       Head (right side): No submental, no submandibular, no tonsillar, no preauricular and no posterior auricular adenopathy present.       Head (left side): No submental, no submandibular, no tonsillar, no preauricular and no posterior auricular adenopathy present.    She has no cervical adenopathy.       Right: No supraclavicular adenopathy  present.       Left: No supraclavicular adenopathy present.  Neurological: She is alert and oriented to person, place, and time.  Skin: Skin is warm and dry.  Psychiatric: She has a normal mood and affect. Her behavior is normal. Judgment and thought content normal.       Assessment & Plan:   1. Thrush Resolved. Complete full length of diflucan.   2. Chronic insomnia D/c trazodone, xanax and ambien due to intolerance. Continue Valium one tablet at night PRN. Start Atarax - may take up to 2 tablets nightly for insomnia. Will recheck in 1 month.  - diazepam (VALIUM) 5 MG tablet; Take 1 tablet (5 mg total) by mouth daily.  Dispense: 30 tablet; Refill: 0 - hydrOXYzine (ATARAX/VISTARIL) 25 MG tablet; Take 0.5-2 tablets (12.5-50 mg total) by mouth at bedtime as needed for anxiety (insomnia).  Dispense: 60 tablet; Refill: 2  3. DEPRESSION Well controlled. Continue current medication as prescribed.  - citalopram (CELEXA) 20 MG tablet; Take 1 tablet (20 mg total) by mouth daily.  Dispense: 90 tablet; Refill: 3  4. ANXIETY Well controlled. Continue current medication as prescribed.  - citalopram (CELEXA) 20 MG tablet; Take 1 tablet (20 mg total) by mouth daily.  Dispense: 90 tablet; Refill: 3 - diazepam (VALIUM) 5 MG tablet; Take 1 tablet (5 mg total) by mouth daily.  Dispense: 30 tablet; Refill: 0 - hydrOXYzine (ATARAX/VISTARIL) 25 MG tablet; Take 0.5-2 tablets (12.5-50  mg total) by mouth at bedtime as needed for anxiety (insomnia).  Dispense: 60 tablet; Refill: 2  5. FIBROMYALGIA Well controlled. Continue current medication as prescribed.

## 2013-07-19 NOTE — Progress Notes (Signed)
I have examined this patient along with the student and agree.  

## 2013-07-23 ENCOUNTER — Ambulatory Visit: Payer: 59 | Admitting: Professional

## 2013-07-30 ENCOUNTER — Ambulatory Visit: Payer: 59 | Admitting: Professional

## 2013-08-05 ENCOUNTER — Encounter: Payer: Self-pay | Admitting: Physician Assistant

## 2013-08-06 ENCOUNTER — Other Ambulatory Visit: Payer: Self-pay | Admitting: Physician Assistant

## 2013-08-15 ENCOUNTER — Ambulatory Visit (INDEPENDENT_AMBULATORY_CARE_PROVIDER_SITE_OTHER): Payer: Medicare Other | Admitting: Physician Assistant

## 2013-08-15 ENCOUNTER — Encounter: Payer: Self-pay | Admitting: Physician Assistant

## 2013-08-15 VITALS — BP 109/69 | HR 50 | Temp 97.4°F | Resp 16 | Ht 67.5 in | Wt 159.0 lb

## 2013-08-15 DIAGNOSIS — M25559 Pain in unspecified hip: Secondary | ICD-10-CM

## 2013-08-15 DIAGNOSIS — M25551 Pain in right hip: Secondary | ICD-10-CM

## 2013-08-15 DIAGNOSIS — J45909 Unspecified asthma, uncomplicated: Secondary | ICD-10-CM

## 2013-08-15 DIAGNOSIS — H109 Unspecified conjunctivitis: Secondary | ICD-10-CM

## 2013-08-15 MED ORDER — KETOROLAC TROMETHAMINE 0.4 % OP SOLN: 1.0000 [drp] | Freq: Four times a day (QID) | OPHTHALMIC | Status: DC

## 2013-08-15 MED ORDER — ALBUTEROL SULFATE HFA 108 (90 BASE) MCG/ACT IN AERS: 2.0000 | INHALATION_SPRAY | Freq: Four times a day (QID) | RESPIRATORY_TRACT | Status: DC | PRN

## 2013-08-15 MED ORDER — MELOXICAM 15 MG PO TABS: 15.0000 mg | ORAL_TABLET | Freq: Every day | ORAL | Status: DC

## 2013-08-15 NOTE — Progress Notes (Signed)
   Subjective:    Patient ID: [name], female    DOB: 1956/04/02, 58 y.o.   MRN: [id number]   PCP: Rj Pedrosa, PA-C  Chief Complaint  Patient presents with  . Hip Pain  . disability forms  . itchy eyes  . Medication Refill    Medications, allergies, past medical history, surgical history, family history, social history and problem list reviewed and updated.  HPI  Eyes have been itchy for at least a month.  Some mornings, the lashes get matted up.  Sometimes red.  Says that she has an eye doctor, "For glasses,"but she didn't think to seek advice there for this problem. Visine burns, and only helps a little bit.  "All my teeth got to hurting." Lasted about a week, resolved about a week ago.  "Then the eye started up."  "The pill you gave me to sleep with, I love it, but I just get a little drowsy in the daytime.  Some nights I don't take it."  During the cold weather, she was house bound.  Her RIGHT hip started hurting again.  She was at pain management, and was given some pads, but didn't want them to inject it.  Stopped taking the ibuprofen due to somnolence and constipation.  Doesn't want an injection there, "They messed up my foot." Last injection (for tarsal tunnel) was 05/2013.  Review of Systems As above. Otherwise feels pretty good.  Her sinuses aren't bothering her, which is remarkable!    Objective:   Physical Exam Blood pressure 109/69, pulse 50, temperature 97.4 F (36.3 C), resp. rate 16, height 5' 7.5" (1.715 m), weight 159 lb (72.122 kg), SpO2 100.00%. Body mass index is 24.52 kg/(m^2). Well-developed, well nourished BF who is awake, alert and oriented, in NAD. HEENT: [place]/AT, PERRL, EOMI.  Sclera and conjunctiva are clear.  Funduscopic exam is normal bilaterally. EAC are patent, TMs are normal in appearance. Nasal mucosa is pink and moist. OP is clear. Neck: supple, non-tender, no lymphadenopathy, thyromegaly. Heart: RRR, no murmur Lungs: normal effort,  CTA Extremities: no cyanosis, clubbing or edema. She has FROM of the hips without pain.  Exquisite tenderness with palpation over the RIGHT greater trochanteric bursa. Skin: warm and dry without rash. Psychologic: good mood and appropriate affect, normal speech and behavior.        Assessment & Plan:  1. Extrinsic asthma, unspecified Stable.  Needs albuterol rescue refill - albuterol (PROVENTIL HFA;VENTOLIN HFA) 108 (90 BASE) MCG/ACT inhaler; Inhale 2 puffs into the lungs every 6 (six) hours as needed for wheezing.  Dispense: 18 g; Refill: 2  2. Right hip pain Trochanteric bursitis.  Her last steroid injection was 2 months ago, so we will try meloxicam to see if we can improve her pain. - meloxicam (MOBIC) 15 MG tablet; Take 1 tablet (15 mg total) by mouth daily.  Dispense: 30 tablet; Refill: 1  3. Conjunctivitis Encouraged her to schedule a visit with her eye specialist if her symptoms persist. - ketorolac (ACULAR) 0.4 % SOLN; Place 1 drop into both eyes 4 (four) times daily.  Dispense: 5 mL; Refill: 0   Fara Chute, PA-C Physician Assistant-Certified [facility]

## 2013-08-15 NOTE — Patient Instructions (Signed)
Contact your eye specialist and schedule an appointment to address your itchy eyes. Stop the Advil, and take the meloxicam instead.

## 2013-08-20 ENCOUNTER — Ambulatory Visit: Payer: Self-pay | Admitting: Professional

## 2013-08-22 ENCOUNTER — Telehealth: Payer: Self-pay | Admitting: *Deleted

## 2013-08-22 NOTE — Telephone Encounter (Signed)
Called and spoke to patient regarding disability form. Saw where you needed the previous form you signed. Pt reports she doesn't have this form and Im unable to find a copy. I have placed the current needing to be filled out form in you box. AC

## 2013-08-22 NOTE — Telephone Encounter (Signed)
I've filled these out almost monthly for a year.  Please find one of them. Ask Maudia?

## 2013-08-27 NOTE — Telephone Encounter (Signed)
Patient called to see if these disability forms are ready for pick up. Patient explained that she gave the forms to [facility] during her visit on March 12. Pulled patient's chart to get a copy of what [name] fills out on an almost monthly basis. I just need the copy that patient left on her 08/15/2013 visit so I can complete everything and place forms in pick up drawer.

## 2013-09-24 ENCOUNTER — Ambulatory Visit (INDEPENDENT_AMBULATORY_CARE_PROVIDER_SITE_OTHER): Payer: Medicare Other | Admitting: Professional

## 2013-09-24 DIAGNOSIS — F411 Generalized anxiety disorder: Secondary | ICD-10-CM

## 2013-09-28 ENCOUNTER — Other Ambulatory Visit: Payer: Self-pay | Admitting: Family Medicine

## 2013-10-05 ENCOUNTER — Other Ambulatory Visit: Payer: Self-pay | Admitting: Physician Assistant

## 2013-10-17 ENCOUNTER — Other Ambulatory Visit (HOSPITAL_COMMUNITY): Payer: Self-pay | Admitting: Cardiovascular Disease

## 2013-10-17 ENCOUNTER — Ambulatory Visit (HOSPITAL_COMMUNITY)
Admission: RE | Admit: 2013-10-17 | Discharge: 2013-10-17 | Disposition: A | Discharge status: Home / Self Care | Payer: Medicare Other | Source: Ambulatory Visit | Attending: Cardiovascular Disease | Admitting: Cardiovascular Disease

## 2013-10-17 DIAGNOSIS — Z87891 Personal history of nicotine dependence: Secondary | ICD-10-CM | POA: Insufficient documentation

## 2013-10-17 DIAGNOSIS — R0689 Other abnormalities of breathing: Secondary | ICD-10-CM

## 2013-10-17 DIAGNOSIS — R0602 Shortness of breath: Secondary | ICD-10-CM | POA: Insufficient documentation

## 2013-10-17 DIAGNOSIS — R079 Chest pain, unspecified: Secondary | ICD-10-CM | POA: Insufficient documentation

## 2013-10-22 ENCOUNTER — Encounter: Payer: Self-pay | Admitting: Physician Assistant

## 2013-10-22 ENCOUNTER — Ambulatory Visit (INDEPENDENT_AMBULATORY_CARE_PROVIDER_SITE_OTHER): Payer: 59 | Admitting: Professional

## 2013-10-22 ENCOUNTER — Ambulatory Visit (INDEPENDENT_AMBULATORY_CARE_PROVIDER_SITE_OTHER): Payer: Medicare Other | Admitting: Physician Assistant

## 2013-10-22 VITALS — BP 120/74 | HR 46 | Temp 98.3°F | Resp 16 | Ht 67.0 in | Wt 160.6 lb

## 2013-10-22 DIAGNOSIS — R609 Edema, unspecified: Secondary | ICD-10-CM

## 2013-10-22 DIAGNOSIS — J310 Chronic rhinitis: Secondary | ICD-10-CM | POA: Insufficient documentation

## 2013-10-22 DIAGNOSIS — K760 Fatty (change of) liver, not elsewhere classified: Secondary | ICD-10-CM | POA: Insufficient documentation

## 2013-10-22 DIAGNOSIS — M810 Age-related osteoporosis without current pathological fracture: Secondary | ICD-10-CM

## 2013-10-22 DIAGNOSIS — Z1159 Encounter for screening for other viral diseases: Secondary | ICD-10-CM

## 2013-10-22 DIAGNOSIS — F329 Major depressive disorder, single episode, unspecified: Secondary | ICD-10-CM

## 2013-10-22 DIAGNOSIS — I10 Essential (primary) hypertension: Secondary | ICD-10-CM

## 2013-10-22 DIAGNOSIS — R600 Localized edema: Secondary | ICD-10-CM

## 2013-10-22 DIAGNOSIS — F32A Depression, unspecified: Secondary | ICD-10-CM

## 2013-10-22 DIAGNOSIS — F419 Anxiety disorder, unspecified: Secondary | ICD-10-CM

## 2013-10-22 DIAGNOSIS — K7581 Nonalcoholic steatohepatitis (NASH): Secondary | ICD-10-CM | POA: Insufficient documentation

## 2013-10-22 DIAGNOSIS — M25551 Pain in right hip: Secondary | ICD-10-CM | POA: Insufficient documentation

## 2013-10-22 DIAGNOSIS — F411 Generalized anxiety disorder: Secondary | ICD-10-CM

## 2013-10-22 DIAGNOSIS — IMO0001 Reserved for inherently not codable concepts without codable children: Secondary | ICD-10-CM

## 2013-10-22 DIAGNOSIS — E785 Hyperlipidemia, unspecified: Secondary | ICD-10-CM

## 2013-10-22 DIAGNOSIS — Z0000 Encounter for general adult medical examination without abnormal findings: Secondary | ICD-10-CM

## 2013-10-22 DIAGNOSIS — N3010 Interstitial cystitis (chronic) without hematuria: Secondary | ICD-10-CM

## 2013-10-22 MED ORDER — FUROSEMIDE 20 MG PO TABS: 20.0000 mg | ORAL_TABLET | Freq: Every day | ORAL | Status: DC | PRN

## 2013-10-22 MED ORDER — FENOFIBRATE 160 MG PO TABS: 160.0000 mg | ORAL_TABLET | Freq: Every day | ORAL | Status: DC

## 2013-10-22 MED ORDER — MONTELUKAST SODIUM 10 MG PO TABS: 10.0000 mg | ORAL_TABLET | Freq: Every day | ORAL | Status: DC

## 2013-10-22 NOTE — Progress Notes (Signed)
Subjective:    Patient ID: [name], female    DOB: 1955/09/27, 58 y.o.   MRN: [id number]  PCP: Eda Magnussen, PA-C  Chief Complaint  Patient presents with  . Annual Exam  . Medication Refill    Singular, Fenofibrate      Active Ambulatory Problems    Diagnosis Date Noted  . LACTOSE INTOLERANCE 11/20/2007  . ANXIETY 08/02/2007  . DEPRESSION 08/02/2007  . EXTERNAL HEMORRHOIDS 05/12/2005  . GERD 12/04/2008  . DIVERTICULOSIS, COLON 11/16/2006  . DIVERTICULITIS, ACUTE 11/20/2007  . CONSTIPATION 10/21/2008  . FIBROMYALGIA 10/21/2008  . OSTEOPOROSIS 08/02/2007  . CHEST PAIN 11/20/2007  . Abdominal pain, left lower quadrant 12/04/2008  . ABDOMINAL PAIN, EPIGASTRIC 12/12/2007  . PERSONAL HX COLONIC POLYPS 12/04/2008  . Childhood asthma 05/30/2011  . Anal pain 12/07/2011  . Hyperglycemia 05/22/2012  . Chronic insomnia 09/25/2012  . HTN (hypertension) 09/25/2012  . Hyperlipidemia 09/25/2012   Resolved Ambulatory Problems    Diagnosis Date Noted  . Proctalgia fugax 12/22/2011   Past Medical History  Diagnosis Date  . Angina   . GERD (gastroesophageal reflux disease)   . Arthritis   . Anxiety   . Fibromyalgia   . Bursitis   . Anal fissure 02/2012  . Hypertension   . Tarsal tunnel syndrome of left side   . Complication of anesthesia   . Depression   . Asthma   . Irritable bowel syndrome (IBS)   . Cardiomegaly   . TMJ (temporomandibular joint syndrome)   . Dental crowns present   . Wears partial dentures   . High cholesterol   . Pneumonia ~ 06/2012  . Chronic bronchitis   . Shortness of breath   . Sinus headache   . Tarsal tunnel syndrome of left side   . Hepatitis A infection 1979  . Allergy     Past Surgical History  Procedure Laterality Date  . Shoulder arthroscopy w/ rotator cuff repair Left 1999  . Bilateral salpingoophorectomy Bilateral 04/2011  . Cystoscopy w/ ureteral stent placement  08/04/2009  . Laparoscopic lysis intestinal adhesions   08/04/2009  . Cardiac catheterization  10/08/2007  . Examination under anesthesia  02/17/2012    Procedure: EXAM UNDER ANESTHESIA;  Surgeon: Pedro Earls, MD;  Location: [facility];  Service: General;  Laterality: N/A;  Exam under anesthesia with lateral internal sphincterotomy  . Sphincterotomy  02/17/2012    Procedure: SPHINCTEROTOMY;  Surgeon: Pedro Earls, MD;  Location: [facility];  Service: General;  Laterality: N/A;  . Abdominal hysterectomy  09/1994    partial  . Appendectomy    . Cesarean section    . Small intestine surgery      Allergies  Allergen Reactions  . Codeine Other (See Comments)    HALLUCINATIONS   . Propoxyphene N-Acetaminophen Other (See Comments)    HALLUCINATIONS  . Sulfonamide Derivatives Hives, Itching and Swelling  . Alka-Seltzer [Aspirin Effervescent] Other (See Comments)    "like she's fading away"  . Norvasc [Amlodipine Besylate]   . Iohexol Nausea Only    JITTERY    . Latex Hives and Itching    Prior to Admission medications   Medication Sig Start Date End Date Taking? Authorizing Provider  albuterol (PROVENTIL HFA;VENTOLIN HFA) 108 (90 BASE) MCG/ACT inhaler Inhale 2 puffs into the lungs every 6 (six) hours as needed for wheezing. 08/15/13  Yes Vernis Cabacungan S Rocky Rishel, PA-C  amoxicillin (AMOXIL) 500 MG capsule Take 500 mg by mouth 3 (three) times  daily.   Yes Historical Provider, MD  citalopram (CELEXA) 20 MG tablet Take 1 tablet (20 mg total) by mouth daily. 07/18/13  Yes Jayma Volpi S Cassiel Fernandez, PA-C  cyclobenzaprine (FLEXERIL) 10 MG tablet  09/28/13  Yes Historical Provider, MD  Desoximetasone (TOPICORT) 0.25 % ointment  10/08/13  Yes Historical Provider, MD  dexlansoprazole (DEXILANT) 60 MG capsule Take 60 mg by mouth daily as needed (for acid reflux).   Yes Historical Provider, MD  diazepam (VALIUM) 5 MG tablet Take 1 tablet (5 mg total) by mouth daily. 07/18/13  Yes Shallon Yaklin S Willim Turnage, PA-C  dicyclomine (BENTYL) 10 MG capsule Take  10 mg by mouth 3 (three) times daily as needed (for stomach).    Yes Historical Provider, MD  Diphenhyd-Hydrocort-Nystatin (FIRST-DUKES MOUTHWASH) SUSP Use as directed 5 mLs in the mouth or throat 4 (four) times daily. 07/05/13  Yes Thao P Le, DO  docusate sodium (COLACE) 100 MG capsule Take 100 mg by mouth daily.    Yes Historical Provider, MD  fenofibrate 160 MG tablet Take 1 tablet (160 mg total) by mouth daily. 09/25/12  Yes Syleena Mchan S Genecis Veley, PA-C  fluticasone (FLONASE) 50 MCG/ACT nasal spray USE 2 SPRAYS INTO EACH NOSTRIL EVERY DAY   Yes Ariannie Penaloza S Angala Hilgers, PA-C  hydrOXYzine (ATARAX/VISTARIL) 25 MG tablet Take 0.5-2 tablets (12.5-50 mg total) by mouth at bedtime as needed for anxiety (insomnia). 07/18/13  Yes Verl Kitson S Nasim Garofano, PA-C  ipratropium (ATROVENT) 0.03 % nasal spray USE 2 SPRAYS INTO THE NOSE EVERY 12 (TWELVE) HOURS.   Yes Yoel Kaufhold S Lauriann Milillo, PA-C  ketorolac (ACULAR) 0.4 % SOLN Place 1 drop into both eyes 4 (four) times daily. 08/15/13  Yes Mikenzie Mccannon S Corrissa Martello, PA-C  Linaclotide (LINZESS) 145 MCG CAPS Take 145 mcg by mouth daily.   Yes Historical Provider, MD  meloxicam (MOBIC) 15 MG tablet Take 1 tablet (15 mg total) by mouth daily. 08/15/13  Yes Ormand Senn S Camylle Whicker, PA-C  metaxalone (SKELAXIN) 800 MG tablet Take 400-800 mg by mouth daily as needed for pain (for muscle spasms).    Yes Historical Provider, MD  montelukast (SINGULAIR) 10 MG tablet Take 10 mg by mouth daily. 06/20/13  Yes Historical Provider, MD  Multiple Vitamin (MULTIVITAMIN) tablet Take 1 tablet by mouth daily.   Yes Historical Provider, MD  NITROSTAT 0.4 MG SL tablet Place 0.4 mg under the tongue every 5 (five) minutes as needed for chest pain. For chest pain. 11/24/11  Yes Historical Provider, MD  OVER THE COUNTER MEDICATION Pre menopause tablets taking bid   Yes Historical Provider, MD  oxyCODONE-acetaminophen (PERCOCET) 5-325 MG per tablet Take 1 tablet by mouth every 8 (eight) hours as needed for pain.    Yes Historical Provider, MD   PRESCRIPTION MEDICATION Hydrocodone Cough Syrup taking prn for cough   Yes Historical Provider, MD  Probiotic Product (PROBIOTIC DAILY) CAPS Take 1 capsule by mouth 2 (two) times a week.   Yes Historical Provider, MD  ranitidine (ZANTAC) 150 MG tablet Take 150 mg by mouth as needed for heartburn.    Yes Historical Provider, MD  zolpidem (AMBIEN) 10 MG tablet  09/28/13  Yes Historical Provider, MD    History   Social History  . Marital Status: Divorced    Spouse Name: n/a    Number of Children: 1  . Years of Education: 17   Occupational History  . Disabled     Fibromyalgia, Depression, Anxiety   Social History Main Topics  . Smoking status: Former Smoker -- 0.50 packs/day  for 37 years    Types: Cigarettes    Quit date: 06/05/2012  . Smokeless tobacco: Never Used  . Alcohol Use: No  . Drug Use: No  . Sexual Activity: Not Currently    Birth Control/ Protection: Post-menopausal   Other Topics Concern  . None   Social History Narrative   Lives alone.  Daughter and granddaughter live in [place], [place]. Pt exercise sometimes.    family history includes Anesthesia problems in her sister; Cancer in her maternal grandfather, maternal grandmother, and mother; Diabetes in her brother and mother; Heart disease in her father; Hyperlipidemia in her brother and mother; Hypertension in her mother. indicated that her mother is deceased. She indicated that her father is deceased. She indicated that seven of her eight sisters are alive. She indicated that three of her five brothers are alive. She indicated that her maternal grandmother is deceased. She indicated that her maternal grandfather is deceased. She indicated that her paternal grandmother is deceased. She indicated that her paternal grandfather is deceased. She indicated that her daughter is alive.   HPI  Presents for annual exam, and for refills of several medications.  GYN care with Dr. Carren Rang.  "This was the worst winter ever.  I  was bedridden for part of it." However, we've reduced her recurrent sinusitis with the addition of Singulair. She continues nasal sprays and oral antihistamine daily. Most recently, had to reschedule her stress test with Dr. Doylene Canard (how put her on an antibiotic and a cough medication) for July. She requests a refill of Lasix, which she previously used for LE edema.  She's had recurrent swelling on the LEFT, and used up the remaining Lasix she had with good results.  Review of Systems  Constitutional: Positive for appetite change, fatigue and unexpected weight change.  HENT: Positive for congestion, postnasal drip, rhinorrhea and sinus pressure.   Eyes: Positive for itching.  Respiratory: Positive for cough, shortness of breath and wheezing.   Cardiovascular: Positive for chest pain.  Gastrointestinal: Positive for diarrhea and constipation.  Endocrine: Positive for cold intolerance.  Genitourinary: Negative.   Musculoskeletal: Positive for arthralgias, back pain and myalgias.  Skin: Negative.   Allergic/Immunologic: Positive for environmental allergies.  Neurological: Positive for dizziness and weakness.  Hematological: Negative.   Psychiatric/Behavioral: Positive for sleep disturbance, dysphoric mood and agitation. The patient is hyperactive.        Objective:   Physical Exam  Vitals reviewed. Constitutional: She is oriented to person, place, and time. Vital signs are normal. She appears well-developed and well-nourished. She is active and cooperative. No distress.  BP 120/74  Pulse 46  Temp(Src) 98.3 F (36.8 C) (Oral)  Resp 16  Ht 5' 7"  (1.702 m)  Wt 160 lb 9.6 oz (72.848 kg)  BMI 25.15 kg/m2  SpO2 98%   HENT:  Head: Normocephalic and atraumatic.  Right Ear: Hearing, tympanic membrane, external ear and ear canal normal. No foreign bodies.  Left Ear: Hearing, tympanic membrane, external ear and ear canal normal. No foreign bodies.  Nose: Nose normal.  Mouth/Throat: Uvula is  midline, oropharynx is clear and moist and mucous membranes are normal. No oral lesions. Normal dentition. No dental abscesses or uvula swelling. No oropharyngeal exudate.  Eyes: Conjunctivae, EOM and lids are normal. Pupils are equal, round, and reactive to light. Right eye exhibits no discharge. Left eye exhibits no discharge. No scleral icterus.  Fundoscopic exam:      The right eye shows no arteriolar narrowing, no AV nicking,  no exudate, no hemorrhage and no papilledema. The right eye shows red reflex.       The left eye shows no arteriolar narrowing, no AV nicking, no exudate, no hemorrhage and no papilledema. The left eye shows red reflex.  Neck: Trachea normal, normal range of motion and full passive range of motion without pain. Neck supple. No spinous process tenderness and no muscular tenderness present. No mass and no thyromegaly present.  Cardiovascular: Normal rate, regular rhythm, normal heart sounds, intact distal pulses and normal pulses.   Pulmonary/Chest: Effort normal and breath sounds normal. Right breast exhibits no inverted nipple, no mass, no nipple discharge, no skin change and no tenderness. Left breast exhibits no inverted nipple, no mass, no nipple discharge, no skin change and no tenderness. Breasts are symmetrical.  Genitourinary: No breast swelling, tenderness, discharge or bleeding. Pelvic exam was performed with patient supine.  Musculoskeletal: She exhibits no edema and no tenderness.  Generalized tenderness, all over, even to light touch.  Lymphadenopathy:       Head (right side): No tonsillar, no preauricular, no posterior auricular and no occipital adenopathy present.       Head (left side): No tonsillar, no preauricular, no posterior auricular and no occipital adenopathy present.    She has no cervical adenopathy.       Right: No supraclavicular adenopathy present.       Left: No supraclavicular adenopathy present.  Neurological: She is alert and oriented to  person, place, and time. She has normal strength and normal reflexes. No cranial nerve deficit. She exhibits normal muscle tone. Coordination and gait normal.  Skin: Skin is warm, dry and intact. No rash noted. She is not diaphoretic. No cyanosis or erythema. Nails show no clubbing.  Psychiatric: She has a normal mood and affect. Her speech is normal and behavior is normal. Judgment and thought content normal.       Assessment & Plan:  1. Routine general medical examination at a health care facility Age appropriate anticipatory guidance provided. Continue GYN care with Dr. Carren Rang.  2. FIBROMYALGIA She continues to have pain all over that prevents her from being as active as she would like. Depression and anxiety contribute substantially. She is not interested in increasing her SSRI dose or changing to alternate treatment.  3. HTN (hypertension) Controlled.  Continue current treatment and follow-up with Dr. Doylene Canard as planned.  4. Hyperlipidemia Await labs.  Continue current treatment. - Lipid panel - COMPLETE METABOLIC PANEL WITH GFR - fenofibrate 160 MG tablet; Take 1 tablet (160 mg total) by mouth daily.  Dispense: 90 tablet; Refill: 3  5. OSTEOPOROSIS Update DEXA (ok to schedule along with her mammogram this fall). - DG Bone Density; Future  6. Chronic rhinitis Stable.  Continue Flonase, PRN Atrovent, daily antihistamine and Singulair. - montelukast (SINGULAIR) 10 MG tablet; Take 1 tablet (10 mg total) by mouth daily.  Dispense: 90 tablet; Refill: 3  7. Leg edema, left Intermittent. OK to use PRN Lasix.  Follow-up with Dr. Doylene Canard. - furosemide (LASIX) 20 MG tablet; Take 1 tablet (20 mg total) by mouth daily as needed (leg swelling).  Dispense: 30 tablet; Refill: 3  8. Need for hepatitis C screening test - Hepatitis C antibody  Return in about 3 months (around 01/22/2014).  Fara Chute, PA-C Physician Assistant-Certified [facility]

## 2013-10-22 NOTE — Patient Instructions (Signed)
I will contact you with your lab results as soon as they are available.   If you have not heard from me in 2 weeks, please contact me.  The fastest way to get your results is to register for My Chart (see the instructions on the last page of this printout).  Keeping You Healthy  Get These Tests  Blood Pressure- Have your blood pressure checked by your healthcare provider at least once a year.  Normal blood pressure is 120/80.  Weight- Have your body mass index (BMI) calculated to screen for obesity.  BMI is a measure of body fat based on height and weight.  You can calculate your own BMI at [employer].it  Cholesterol- Have your cholesterol checked every year.  Diabetes- Have your blood sugar checked every year if you have high blood pressure, high cholesterol, a family history of diabetes or if you are overweight.  Pap Smear- Have a pap smear every 1 to 3 years if you have been sexually active.  If you are older than 65 and recent pap smears have been normal you may not need additional pap smears.  In addition, if you have had a hysterectomy  For benign disease additional pap smears are not necessary.  Mammogram-Yearly mammograms are essential for early detection of breast cancer  Screening for Colon Cancer- Colonoscopy starting at age 5. Screening may begin sooner depending on your family history and other health conditions.  Follow up colonoscopy as directed by your Gastroenterologist.  Screening for Osteoporosis- Screening begins at age 31 with bone density scanning, sooner if you are at higher risk for developing Osteoporosis.  Get these medicines  Calcium with Vitamin D- Your body requires 1200-1500 mg of Calcium a day and [phone] IU of Vitamin D a day.  You can only absorb 500 mg of Calcium at a time therefore Calcium must be taken in 2 or 3 separate doses throughout the day.  Hormones- Hormone therapy has been associated with increased risk for certain cancers and  heart disease.  Talk to your healthcare provider about if you need relief from menopausal symptoms.  Aspirin- Ask your healthcare provider about taking Aspirin to prevent Heart Disease and Stroke.  Get these Immuniztions  Flu shot- Every fall  Pneumonia shot- Once after the age of 88; if you are younger ask your healthcare provider if you need a pneumonia shot.  Tetanus- Every ten years.  Zostavax- Once after the age of 33 to prevent shingles.  Take these steps  Don't smoke- Your healthcare provider can help you quit. For tips on how to quit, ask your healthcare provider or go to www.smokefree.gov or call 1-800 QUIT-NOW.  Be physically active- Exercise 5 days a week for a minimum of 30 minutes.  If you are not already physically active, start slow and gradually work up to 30 minutes of moderate physical activity.  Try walking, dancing, bike riding, swimming, etc.  Eat a healthy diet- Eat a variety of healthy foods such as fruits, vegetables, whole grains, low fat milk, low fat cheeses, yogurt, lean meats, chicken, fish, eggs, dried beans, tofu, etc.  For more information go to www.thenutritionsource.org  Dental visit- Brush and floss teeth twice daily; visit your dentist twice a year.  Eye exam- Visit your Optometrist or Ophthalmologist yearly.  Drink alcohol in moderation- Limit alcohol intake to one drink or less a day.  Never drink and drive.  Depression- Your emotional health is as important as your physical health.  If you're feeling  down or losing interest in things you normally enjoy, please talk to your healthcare provider.  Seat Belts- can save your life; always wear one  Smoke/Carbon Monoxide detectors- These detectors need to be installed on the appropriate level of your home.  Replace batteries at least once a year.  Violence- If anyone is threatening or hurting you, please tell your healthcare provider.  Living Will/ Health care power of attorney- Discuss with your  healthcare provider and family.

## 2013-10-23 LAB — COMPLETE METABOLIC PANEL WITH GFR
ALT: 53 U/L — AB (ref 0–35)
AST: 31 U/L (ref 0–37)
Albumin: 4.3 g/dL (ref 3.5–5.2)
Alkaline Phosphatase: 43 U/L (ref 39–117)
BUN: 8 mg/dL (ref 6–23)
CALCIUM: 9.7 mg/dL (ref 8.4–10.5)
CHLORIDE: 102 meq/L (ref 96–112)
CO2: 28 mEq/L (ref 19–32)
Creat: 0.65 mg/dL (ref 0.50–1.10)
GFR, Est Non African American: >89 mL/min
Glucose, Bld: 89 mg/dL (ref 70–99)
Potassium: 4.5 mEq/L (ref 3.5–5.3)
SODIUM: 141 meq/L (ref 135–145)
TOTAL PROTEIN: 7.3 g/dL (ref 6.0–8.3)
Total Bilirubin: 0.5 mg/dL (ref 0.2–1.2)

## 2013-10-23 LAB — HEPATITIS C ANTIBODY: HCV Ab: NEGATIVE

## 2013-10-23 LAB — LIPID PANEL
Cholesterol: 189 mg/dL (ref 0–200)
HDL: 35 mg/dL — ABNORMAL LOW (ref >39)
LDL CALC: 140 mg/dL — AB (ref 0–99)
Total CHOL/HDL Ratio: 5.4 Ratio
Triglycerides: 70 mg/dL (ref <150)
VLDL: 14 mg/dL (ref 0–40)

## 2013-10-25 ENCOUNTER — Encounter: Payer: Self-pay | Admitting: Physician Assistant

## 2013-11-12 ENCOUNTER — Ambulatory Visit (INDEPENDENT_AMBULATORY_CARE_PROVIDER_SITE_OTHER): Payer: 59 | Admitting: Professional

## 2013-11-12 DIAGNOSIS — F411 Generalized anxiety disorder: Secondary | ICD-10-CM

## 2013-11-20 ENCOUNTER — Encounter: Payer: Self-pay | Admitting: Physician Assistant

## 2013-11-21 MED ORDER — ALPRAZOLAM 1 MG PO TABS: 1.0000 mg | ORAL_TABLET | Freq: Two times a day (BID) | ORAL | Status: DC | PRN

## 2013-11-21 NOTE — Telephone Encounter (Signed)
Patient notified via My Chart.  Meds ordered this encounter  Medications  . ALPRAZolam (XANAX) 1 MG tablet    Sig: Take 1 tablet (1 mg total) by mouth 2 (two) times daily as needed for anxiety.    Dispense:  60 tablet    Refill:  0    Order Specific Question:  Supervising Provider    Answer:  DOOLITTLE, ROBERT P [[id number]]

## 2013-11-25 ENCOUNTER — Telehealth: Payer: Self-pay

## 2013-11-25 NOTE — Telephone Encounter (Signed)
Patient says she received an email for pick up for xanax, nothing in the pick up drawer. She prefers it sent to her pharmacy [facility] [address].

## 2013-11-26 NOTE — Telephone Encounter (Signed)
lmom that [facility].

## 2013-11-27 NOTE — Telephone Encounter (Signed)
On 6/18 I printed a prescription for this patient's alprazolam.  She is now requesting that we call or fax it to her pharmacy.  Please do so.

## 2013-12-03 ENCOUNTER — Ambulatory Visit (INDEPENDENT_AMBULATORY_CARE_PROVIDER_SITE_OTHER): Payer: 59 | Admitting: Professional

## 2013-12-03 DIAGNOSIS — F411 Generalized anxiety disorder: Secondary | ICD-10-CM

## 2013-12-23 ENCOUNTER — Encounter: Payer: Self-pay | Admitting: Physician Assistant

## 2013-12-24 ENCOUNTER — Ambulatory Visit (INDEPENDENT_AMBULATORY_CARE_PROVIDER_SITE_OTHER): Payer: 59 | Admitting: Professional

## 2013-12-24 DIAGNOSIS — F411 Generalized anxiety disorder: Secondary | ICD-10-CM

## 2014-01-07 ENCOUNTER — Ambulatory Visit (INDEPENDENT_AMBULATORY_CARE_PROVIDER_SITE_OTHER): Payer: 59 | Admitting: Professional

## 2014-01-07 DIAGNOSIS — F411 Generalized anxiety disorder: Secondary | ICD-10-CM

## 2014-01-28 ENCOUNTER — Ambulatory Visit: Payer: Medicare Other | Admitting: Physician Assistant

## 2014-01-28 ENCOUNTER — Ambulatory Visit (INDEPENDENT_AMBULATORY_CARE_PROVIDER_SITE_OTHER): Payer: 59 | Admitting: Professional

## 2014-01-28 DIAGNOSIS — F411 Generalized anxiety disorder: Secondary | ICD-10-CM

## 2014-02-03 ENCOUNTER — Encounter: Payer: Self-pay | Admitting: Physician Assistant

## 2014-02-05 ENCOUNTER — Telehealth: Payer: Self-pay

## 2014-02-05 NOTE — Telephone Encounter (Signed)
Please help me find the one of these I've done previously.  I see the one from [facility], but not a previous one from [facility].

## 2014-02-05 NOTE — Telephone Encounter (Signed)
Pt dropped off ppw 02/04/14 put into [name]'s Box on 9/2 for completion 5-7 business days. Please return to the disability box ar 102 next to check out.    For [place] and [facility].  Pt has paid Please call for pick up [phone] Scan complete doc.

## 2014-02-06 NOTE — Telephone Encounter (Signed)
[name], I will put that ppw in your box.

## 2014-02-07 DIAGNOSIS — Z0271 Encounter for disability determination: Secondary | ICD-10-CM

## 2014-02-08 NOTE — Telephone Encounter (Signed)
Forms completed and returned to disability desk.

## 2014-02-12 ENCOUNTER — Encounter: Payer: Self-pay | Admitting: Physician Assistant

## 2014-02-19 ENCOUNTER — Ambulatory Visit (INDEPENDENT_AMBULATORY_CARE_PROVIDER_SITE_OTHER): Payer: Medicare Other | Admitting: Physician Assistant

## 2014-02-19 VITALS — BP 120/70 | HR 67 | Temp 97.7°F | Resp 16 | Ht 67.0 in | Wt 161.2 lb

## 2014-02-19 DIAGNOSIS — R300 Dysuria: Secondary | ICD-10-CM

## 2014-02-19 LAB — POCT URINALYSIS DIPSTICK
Bilirubin, UA: NEGATIVE
Glucose, UA: NEGATIVE
Nitrite, UA: POSITIVE
PH UA: 5
PROTEIN UA: 100
Spec Grav, UA: <=1.005
Urobilinogen, UA: 2

## 2014-02-19 LAB — POCT UA - MICROSCOPIC ONLY
CRYSTALS, UR, HPF, POC: NEGATIVE
Casts, Ur, LPF, POC: NEGATIVE
MUCUS UA: NEGATIVE
Yeast, UA: NEGATIVE

## 2014-02-19 MED ORDER — NITROFURANTOIN MONOHYD MACRO 100 MG PO CAPS: 100.0000 mg | ORAL_CAPSULE | Freq: Two times a day (BID) | ORAL | Status: AC

## 2014-02-19 MED ORDER — SULFAMETHOXAZOLE-TRIMETHOPRIM 800-160 MG PO TABS: 1.0000 | ORAL_TABLET | Freq: Two times a day (BID) | ORAL | Status: DC

## 2014-02-19 NOTE — Addendum Note (Signed)
Addended by: Fara Chute on: 02/19/2014 10:19 PM   Modules accepted: Orders, Medications

## 2014-02-19 NOTE — Progress Notes (Signed)
Subjective:    Patient ID: [name], female    DOB: Oct 14, 1955, 58 y.o.   MRN: [id number]   PCP: Charlot Gouin,Kortnie Stovall, PA-C  Chief Complaint  Patient presents with  . Urinary Tract Infection    told she also has bronchitis--given zpak for this.     Medications, allergies, past medical history, surgical history, family history, social history and problem list reviewed and updated.  Patient Active Problem List   Diagnosis Date Noted  . Chronic rhinitis 10/22/2013  . Fatty liver 10/22/2013  . Interstitial cystitis 10/22/2013  . Hip pain, right 10/22/2013  . Chronic insomnia 09/25/2012  . HTN (hypertension) 09/25/2012  . Hyperlipidemia 09/25/2012  . Hyperglycemia 05/22/2012  . GERD 12/04/2008  . PERSONAL HX COLONIC POLYPS 12/04/2008  . CONSTIPATION 10/21/2008  . FIBROMYALGIA 10/21/2008  . LACTOSE INTOLERANCE 11/20/2007  . DIVERTICULITIS, ACUTE 11/20/2007  . CHEST PAIN 11/20/2007  . Anxiety and depression 08/02/2007  . OSTEOPOROSIS 08/02/2007  . DIVERTICULOSIS, COLON 11/16/2006  . EXTERNAL HEMORRHOIDS 05/12/2005    Prior to Admission medications   Medication Sig Start Date End Date Taking? Authorizing Provider  albuterol (PROVENTIL HFA;VENTOLIN HFA) 108 (90 BASE) MCG/ACT inhaler Inhale 2 puffs into the lungs every 6 (six) hours as needed for wheezing. 08/15/13  Yes Azarel Banner S Meekah Math, PA-C  ALPRAZolam (XANAX) 1 MG tablet Take 1 tablet (1 mg total) by mouth 2 (two) times daily as needed for anxiety. 11/21/13  Yes Nautica Hotz S Mariene Dickerman, PA-C  cyclobenzaprine (FLEXERIL) 10 MG tablet  09/28/13  Yes Historical Provider, MD  Desoximetasone (TOPICORT) 0.25 % ointment  10/08/13  Yes Historical Provider, MD  dexlansoprazole (DEXILANT) 60 MG capsule Take 60 mg by mouth daily as needed (for acid reflux).   Yes Historical Provider, MD  diazepam (VALIUM) 5 MG tablet Take 1 tablet (5 mg total) by mouth daily. 07/18/13  Yes Angello Chien S Kyasia Steuck, PA-C  dicyclomine (BENTYL) 10 MG capsule Take 10 mg by mouth 3  (three) times daily as needed (for stomach).    Yes Historical Provider, MD  Diphenhyd-Hydrocort-Nystatin (FIRST-DUKES MOUTHWASH) SUSP Use as directed 5 mLs in the mouth or throat 4 (four) times daily. 07/05/13  Yes Thao P Le, DO  docusate sodium (COLACE) 100 MG capsule Take 100 mg by mouth daily.    Yes Historical Provider, MD  escitalopram (LEXAPRO) 5 MG tablet Take 5 mg by mouth daily.   Yes Historical Provider, MD  fenofibrate 160 MG tablet Take 1 tablet (160 mg total) by mouth daily. 10/22/13  Yes Lamyah Creed S Willian Donson, PA-C  fluticasone (FLONASE) 50 MCG/ACT nasal spray USE 2 SPRAYS INTO EACH NOSTRIL EVERY DAY   Yes Verdell Dykman S Berdena Cisek, PA-C  furosemide (LASIX) 20 MG tablet Take 1 tablet (20 mg total) by mouth daily as needed (leg swelling). 10/22/13  Yes Mikias Lanz S Jartavious Mckimmy, PA-C  hydrOXYzine (ATARAX/VISTARIL) 25 MG tablet Take 0.5-2 tablets (12.5-50 mg total) by mouth at bedtime as needed for anxiety (insomnia). 07/18/13  Yes Vandora Jaskulski S Bethanee Redondo, PA-C  ipratropium (ATROVENT) 0.03 % nasal spray USE 2 SPRAYS INTO THE NOSE EVERY 12 (TWELVE) HOURS.   Yes Macaria Bias S Leshon Armistead, PA-C  ketorolac (ACULAR) 0.4 % SOLN Place 1 drop into both eyes 4 (four) times daily. 08/15/13  Yes Elgie Maziarz S Eulon Allnutt, PA-C  Linaclotide (LINZESS) 145 MCG CAPS Take 145 mcg by mouth daily.   Yes Historical Provider, MD  meloxicam (MOBIC) 15 MG tablet Take 1 tablet (15 mg total) by mouth daily. 08/15/13  Yes Zenola Dezarn Janalee Dane, PA-C  metaxalone (  SKELAXIN) 800 MG tablet Take 400-800 mg by mouth daily as needed for pain (for muscle spasms).    Yes Historical Provider, MD  montelukast (SINGULAIR) 10 MG tablet Take 1 tablet (10 mg total) by mouth daily. 10/22/13  Yes Kimberly Coye S Canyon Lohr, PA-C  Multiple Vitamin (MULTIVITAMIN) tablet Take 1 tablet by mouth daily.   Yes Historical Provider, MD  NITROSTAT 0.4 MG SL tablet Place 0.4 mg under the tongue every 5 (five) minutes as needed for chest pain. For chest pain. 11/24/11  Yes Historical Provider, MD  OVER THE  COUNTER MEDICATION Pre menopause tablets taking bid   Yes Historical Provider, MD  oxyCODONE-acetaminophen (PERCOCET) 5-325 MG per tablet Take 1 tablet by mouth every 8 (eight) hours as needed for pain.    Yes Historical Provider, MD  PRESCRIPTION MEDICATION Hydrocodone Cough Syrup taking prn for cough   Yes Historical Provider, MD  Probiotic Product (PROBIOTIC DAILY) CAPS Take 1 capsule by mouth 2 (two) times a week.   Yes Historical Provider, MD  ranitidine (ZANTAC) 150 MG tablet Take 150 mg by mouth as needed for heartburn.    Yes Historical Provider, MD  zolpidem (AMBIEN) 10 MG tablet  09/28/13  Yes Historical Provider, MD    HPI  This 58 y.o. female presents for evaluation of "I have a UTI."  Several days of burning with urination, urgency and frequency and the feeling that she can't empty her bladder completely. Started a Zpack for bronchitis 3 days ago (prescribed by her cardiologist), and those symptoms are improving. No fever, chills, nausea, vomiting. "I haven't had one of these in years.  People don't realize-this is pain. This is pain."  No vaginal discharge.   Review of Systems     Objective:   Physical Exam  Constitutional: She is oriented to person, place, and time. She appears well-developed and well-nourished. She is active and cooperative. No distress.  BP 120/70  Pulse 67  Temp(Src) 97.7 F (36.5 C) (Oral)  Resp 16  Ht 5' 7"  (1.702 m)  Wt 161 lb 3.2 oz (73.12 kg)  BMI 25.24 kg/m2  SpO2 98%   Eyes: Conjunctivae are normal.  Pulmonary/Chest: Effort normal.  Abdominal: Soft. Normal appearance and bowel sounds are normal. There is tenderness in the suprapubic area and left lower quadrant. There is no rebound, no guarding and no CVA tenderness.  Neurological: She is alert and oriented to person, place, and time.  Psychiatric: She has a normal mood and affect. Her speech is normal and behavior is normal.     Results for orders placed in visit on 02/19/14  POCT  UA - MICROSCOPIC ONLY      Result Value Ref Range   WBC, Ur, HPF, POC 10-15     RBC, urine, microscopic 3-5     Bacteria, U Microscopic small     Mucus, UA neg     Epithelial cells, urine per micros 2-4     Crystals, Ur, HPF, POC neg     Casts, Ur, LPF, POC neg     Yeast, UA neg    POCT URINALYSIS DIPSTICK      Result Value Ref Range   Color, UA orange     Clarity, UA hazy     Glucose, UA neg     Bilirubin, UA neg     Ketones, UA trace     Spec Grav, UA <=1.005     Blood, UA mod     pH, UA 5.0  Protein, UA 100     Urobilinogen, UA 2.0     Nitrite, UA positive     Leukocytes, UA large (3+)          Assessment & Plan:  1. Dysuria Anticipatory guidance provided. Supportive care. - POCT UA - Microscopic Only - POCT urinalysis dipstick - Urine culture - sulfamethoxazole-trimethoprim (BACTRIM DS,SEPTRA DS) 800-160 MG per tablet; Take 1 tablet by mouth 2 (two) times daily.  Dispense: 10 tablet; Refill: 0   Fara Chute, PA-C Physician Assistant-Certified [facility]

## 2014-02-19 NOTE — Patient Instructions (Signed)

## 2014-02-20 ENCOUNTER — Encounter: Payer: Self-pay | Admitting: Physician Assistant

## 2014-02-22 LAB — URINE CULTURE: Colony Count: >=100000

## 2014-02-25 ENCOUNTER — Ambulatory Visit (INDEPENDENT_AMBULATORY_CARE_PROVIDER_SITE_OTHER): Payer: 59 | Admitting: Professional

## 2014-02-25 DIAGNOSIS — F411 Generalized anxiety disorder: Secondary | ICD-10-CM

## 2014-02-26 NOTE — Progress Notes (Signed)
Scheduled a follow up and fasting labs appointment with Dr. Tamala Julian on 03/26/14 at 11:15

## 2014-02-27 ENCOUNTER — Encounter: Payer: Self-pay | Admitting: Physician Assistant

## 2014-03-01 ENCOUNTER — Other Ambulatory Visit: Payer: Self-pay | Admitting: Physician Assistant

## 2014-03-03 MED ORDER — ALPRAZOLAM 1 MG PO TABS: 1.0000 mg | ORAL_TABLET | Freq: Two times a day (BID) | ORAL | Status: DC | PRN

## 2014-03-03 NOTE — Telephone Encounter (Signed)
Patient notified via My Chart (in another message).  Meds ordered this encounter  Medications  . ALPRAZolam (XANAX) 1 MG tablet    Sig: Take 1 tablet (1 mg total) by mouth 2 (two) times daily as needed for anxiety.    Dispense:  60 tablet    Refill:  0    Order Specific Question:  Supervising Provider    Answer:  DOOLITTLE, ROBERT P [[id number]]

## 2014-03-04 ENCOUNTER — Encounter: Payer: Self-pay | Admitting: Gastroenterology

## 2014-03-10 ENCOUNTER — Ambulatory Visit (INDEPENDENT_AMBULATORY_CARE_PROVIDER_SITE_OTHER): Payer: Medicare Other

## 2014-03-10 ENCOUNTER — Ambulatory Visit (INDEPENDENT_AMBULATORY_CARE_PROVIDER_SITE_OTHER): Payer: Medicare Other | Admitting: Emergency Medicine

## 2014-03-10 VITALS — BP 122/78 | HR 58 | Temp 97.8°F | Resp 16 | Ht 69.5 in | Wt 160.4 lb

## 2014-03-10 DIAGNOSIS — J310 Chronic rhinitis: Secondary | ICD-10-CM

## 2014-03-10 DIAGNOSIS — R300 Dysuria: Secondary | ICD-10-CM

## 2014-03-10 DIAGNOSIS — R1032 Left lower quadrant pain: Secondary | ICD-10-CM

## 2014-03-10 LAB — POCT URINALYSIS DIPSTICK
BILIRUBIN UA: NEGATIVE
Glucose, UA: NEGATIVE
KETONES UA: NEGATIVE
LEUKOCYTES UA: NEGATIVE
NITRITE UA: NEGATIVE
Protein, UA: NEGATIVE
Spec Grav, UA: <=1.005
Urobilinogen, UA: 0.2
pH, UA: 6.5

## 2014-03-10 LAB — POCT UA - MICROSCOPIC ONLY
Bacteria, U Microscopic: NEGATIVE
CASTS, UR, LPF, POC: NEGATIVE
Crystals, Ur, HPF, POC: NEGATIVE
MUCUS UA: NEGATIVE
RBC, URINE, MICROSCOPIC: NEGATIVE
WBC, UR, HPF, POC: NEGATIVE
Yeast, UA: NEGATIVE

## 2014-03-10 NOTE — Patient Instructions (Signed)
If the pain gets worse, to to the emergency department

## 2014-03-10 NOTE — Progress Notes (Signed)
Subjective:    Patient ID: [name], female    DOB: Jun 13, 1955, 58 y.o.   MRN: [id number]   PCP: Athalie Newhard, PA-C  Chief Complaint  Patient presents with  . Urinary Tract Infection    Medications, allergies, past medical history, surgical history, family history, social history and problem list reviewed and updated.  HPI  Complains of LEFT groin pain that radiates to the LEFT back. Notes increased dietary fiber, and wonders if it's due to diverticular disease. Has had 2 BMs this morning. Hopes that she has a bladder infection.  She was treated about 3 weeks ago for cystitis.  Increased pain with weight bearing, but pain persists at rest, even lying down. Reports burning with urination, stinging, and urgency, and increased frequency. Nocturia x 2. Fever/chills subjectively. Nauseated, weak.  "Just ain't felt good."  Additionally, she'd like me to check her sinuses-she's worried that with all the rain she's going to get sinusitis. She did a sinus rinse this morning with resolution of the RIGHT sided facial pressure she felt upon waking.  She asks for extra pain medication-she sees Pain Management next week.  Review of Systems As above.    Objective:   Physical Exam  Vitals reviewed. Constitutional: She is oriented to person, place, and time. Vital signs are normal. She appears well-developed and well-nourished. She is active and cooperative. No distress.  BP 122/78  Pulse 58  Temp(Src) 97.8 F (36.6 C) (Oral)  Resp 16  Ht 5' 9.5" (1.765 m)  Wt 160 lb 6.4 oz (72.757 kg)  BMI 23.36 kg/m2  SpO2 96%  HENT:  Head: Normocephalic and atraumatic.  Right Ear: Hearing normal.  Left Ear: Hearing normal.  Eyes: Conjunctivae are normal. No scleral icterus.  Neck: Normal range of motion. Neck supple. No thyromegaly present.  Cardiovascular: Normal rate, regular rhythm and normal heart sounds.   Pulses:      Radial pulses are 2+ on the right side, and 2+ on the left  side.  Pulmonary/Chest: Effort normal and breath sounds normal.  Abdominal: Soft. Bowel sounds are normal. She exhibits no distension and no mass. There is no rebound.    Lymphadenopathy:       Head (right side): No tonsillar, no preauricular, no posterior auricular and no occipital adenopathy present.       Head (left side): No tonsillar, no preauricular, no posterior auricular and no occipital adenopathy present.    She has no cervical adenopathy.       Right: No supraclavicular adenopathy present.       Left: No supraclavicular adenopathy present.  Neurological: She is alert and oriented to person, place, and time. No sensory deficit.  Skin: Skin is warm, dry and intact. No rash noted. No cyanosis or erythema. Nails show no clubbing.  Psychiatric: She has a normal mood and affect.   Acute Abdominal Series: [facility] reading (PRIMARY) by  Dr. Everlene Farrier. Large gastric bubble.  Normal abdominal series..         Assessment & Plan:  1. Groin pain, left Suspect incisional hernia. Declined CT scan while we work to schedule her with Dr. Kaylyn Lim at [place]. She's instructed to RTC or report to the ED if symptoms worsen/persist. - DG Abd Acute W/Chest - Ambulatory referral to General Surgery  2. Dysuria No evidence of UTI. - POCT urinalysis dipstick - POCT UA - Microscopic Only  3. Chronic rhinitis Encouraged her to continue her regular meds and use sinus irrigation as needed.  Fara Chute, PA-C Physician Assistant-Certified [facility]

## 2014-03-17 ENCOUNTER — Encounter: Payer: Self-pay | Admitting: Physician Assistant

## 2014-03-17 DIAGNOSIS — M797 Fibromyalgia: Secondary | ICD-10-CM

## 2014-03-18 ENCOUNTER — Ambulatory Visit (INDEPENDENT_AMBULATORY_CARE_PROVIDER_SITE_OTHER): Payer: 59 | Admitting: Professional

## 2014-03-18 ENCOUNTER — Ambulatory Visit: Payer: Medicare Other | Admitting: Physician Assistant

## 2014-03-18 DIAGNOSIS — F411 Generalized anxiety disorder: Secondary | ICD-10-CM

## 2014-03-26 ENCOUNTER — Ambulatory Visit (INDEPENDENT_AMBULATORY_CARE_PROVIDER_SITE_OTHER): Payer: Medicare Other | Admitting: Family Medicine

## 2014-03-26 ENCOUNTER — Encounter: Payer: Self-pay | Admitting: Family Medicine

## 2014-03-26 ENCOUNTER — Ambulatory Visit (INDEPENDENT_AMBULATORY_CARE_PROVIDER_SITE_OTHER): Payer: Medicare Other

## 2014-03-26 VITALS — BP 144/82 | HR 70 | Temp 97.7°F | Resp 16 | Ht 67.0 in | Wt 161.0 lb

## 2014-03-26 DIAGNOSIS — Z131 Encounter for screening for diabetes mellitus: Secondary | ICD-10-CM

## 2014-03-26 DIAGNOSIS — E7800 Pure hypercholesterolemia, unspecified: Secondary | ICD-10-CM

## 2014-03-26 DIAGNOSIS — R739 Hyperglycemia, unspecified: Secondary | ICD-10-CM

## 2014-03-26 DIAGNOSIS — M7061 Trochanteric bursitis, right hip: Secondary | ICD-10-CM

## 2014-03-26 DIAGNOSIS — Z1329 Encounter for screening for other suspected endocrine disorder: Secondary | ICD-10-CM

## 2014-03-26 DIAGNOSIS — M25551 Pain in right hip: Secondary | ICD-10-CM

## 2014-03-26 DIAGNOSIS — J4540 Moderate persistent asthma, uncomplicated: Secondary | ICD-10-CM

## 2014-03-26 LAB — COMPREHENSIVE METABOLIC PANEL
ALBUMIN: 4.4 g/dL (ref 3.5–5.2)
ALT: 33 U/L (ref 0–35)
AST: 22 U/L (ref 0–37)
Alkaline Phosphatase: 38 U/L — ABNORMAL LOW (ref 39–117)
BUN: 11 mg/dL (ref 6–23)
CO2: 27 meq/L (ref 19–32)
Calcium: 9.3 mg/dL (ref 8.4–10.5)
Chloride: 107 mEq/L (ref 96–112)
Creat: 0.65 mg/dL (ref 0.50–1.10)
Glucose, Bld: 94 mg/dL (ref 70–99)
Potassium: 4.4 mEq/L (ref 3.5–5.3)
SODIUM: 142 meq/L (ref 135–145)
TOTAL PROTEIN: 7 g/dL (ref 6.0–8.3)
Total Bilirubin: 0.4 mg/dL (ref 0.2–1.2)

## 2014-03-26 LAB — POCT GLYCOSYLATED HEMOGLOBIN (HGB A1C): HEMOGLOBIN A1C: 5.8

## 2014-03-26 LAB — LIPID PANEL
CHOLESTEROL: 167 mg/dL (ref 0–200)
HDL: 41 mg/dL (ref >39)
LDL Cholesterol: 113 mg/dL — ABNORMAL HIGH (ref 0–99)
Total CHOL/HDL Ratio: 4.1 Ratio
Triglycerides: 66 mg/dL (ref <150)
VLDL: 13 mg/dL (ref 0–40)

## 2014-03-26 LAB — GLUCOSE, POCT (MANUAL RESULT ENTRY): POC GLUCOSE: 86 mg/dL (ref 70–99)

## 2014-03-26 LAB — TSH: TSH: 0.832 u[IU]/mL (ref 0.350–4.500)

## 2014-03-26 MED ORDER — BECLOMETHASONE DIPROPIONATE 40 MCG/ACT IN AERS: 1.0000 | INHALATION_SPRAY | Freq: Two times a day (BID) | RESPIRATORY_TRACT | Status: DC

## 2014-03-26 NOTE — Progress Notes (Signed)
Subjective:    Patient ID: [name], female    DOB: 1955-12-01, 58 y.o.   MRN: [id number]  Past Medical History  Diagnosis Date  . Angina   . GERD (gastroesophageal reflux disease)   . Arthritis   . Anxiety   . Fibromyalgia   . Bursitis     right hip  . Anal fissure 02/2012  . Hypertension     under control, has been on med. since 2010  . Tarsal tunnel syndrome of left side   . Complication of anesthesia     difficult to wake up post-op  . Depression   . Asthma     daily and prn inhalers  . Irritable bowel syndrome (IBS)   . Cardiomegaly   . TMJ (temporomandibular joint syndrome)     left  . Dental crowns present   . Wears partial dentures     upper and lower  . High cholesterol   . Pneumonia ~ 06/2012  . Chronic bronchitis     "get it about q year" (12/27/2012)  . Shortness of breath     "none since I quit smoking in 06/2012" (12/27/2012)  . Sinus headache     "@ times" (12/27/2012)  . Tarsal tunnel syndrome of left side   . Hepatitis A infection 1979  . Allergy   . Childhood asthma 05/30/2011    HPI  58 year old female with PMH listed above here today for followup and concerns of her thyroid, hip pain, and wheezing.    [name] is complaining today of hip pain that started 2 weeks ago "when the weather went down".  It is at her right hip bone with throbbing that radiates down through her leg.  She denies any numbness or tingling.  She decided that it was tolerable with just taking ibuprofen until 3 days ago when the pain woke her up in the middle of the night.  She was sleeping on her right side at the time of occurrence and felt the pain shoot down her leg.  She denies fever or warmth to the area.  She took meloxicam and a warm compress which helped a lot, but she is concerned that she unable to walk normally due to this pain.  She is also unable to exercise which is making her fibromyalgia worse.  She refuses to use a cane, due to the appearance of looking old.  She  states that she has a history of bursitis diagnosed years ago.   She is also complaining of wheezing in the morning when she wakes up.  This has been occurring since the weather change, and she must use her inhaler.  This clears the symptoms for the day.  She is able to maintain her daily activities without dyspnea, sob, or the use of the inhaler.    She would also like to have her thyroid checked for a possibility of her fatigue.  She has a hx of constipation.  She denies palpitations, nausea, diarrhea, or skin changes.     Review of Systems  Constitutional: Positive for fatigue. Negative for fever and chills.  Respiratory: Positive for wheezing.   Cardiovascular: Negative for chest pain, palpitations and leg swelling.  Gastrointestinal: Positive for constipation. Negative for nausea and diarrhea.  Musculoskeletal: Positive for myalgias.  Skin: Negative.   Neurological: Negative for dizziness and numbness.       Objective:   Physical Exam  Constitutional: She is oriented to person, place, and time. She  appears well-developed and well-nourished.  HENT:  Head: Atraumatic.  Neck: No thyromegaly present.  Cardiovascular: Normal rate, regular rhythm, normal heart sounds and intact distal pulses.  Exam reveals no gallop and no friction rub.   No murmur heard. Pulmonary/Chest: Effort normal. No respiratory distress. She has wheezes (Wheezing at left lobe with expiration).  Abdominal: Soft. Bowel sounds are normal.  Musculoskeletal:       Legs: Neurological: She is alert and oriented to person, place, and time. She exhibits normal muscle tone. Coordination normal.  Psychiatric: She has a normal mood and affect. Judgment and thought content normal. Her speech is tangential (Tangential, however not pressured, rapid, or manic-like.). She is not combative. Cognition and memory are normal.    BP 144/82  Pulse 70  Temp(Src) 97.7 F (36.5 C)  Resp 16  Ht 5' 7"  (1.702 m)  Wt 161 lb (73.029  kg)  BMI 25.21 kg/m2  SpO2 98%  Results for orders placed in visit on 03/26/14  POCT GLYCOSYLATED HEMOGLOBIN (HGB A1C)      Result Value Ref Range   Hemoglobin A1C 5.8    GLUCOSE, POCT (MANUAL RESULT ENTRY)      Result Value Ref Range   POC Glucose 86  70 - 99 mg/dl    Complete Right Xray Primary Reading by Dr. Tamala Julian: Negative.  No spurring.  No arthritic changes.    Bursa Injection Procedure: Consent Obtained.  Palpation for bursa/pain successful.  Marked site by indention of cap.  Cleaned site with povidine.  1%Lidocaine/Depomedrol injected at demarcated site.  Wiped site with alcohol.  Adhesive bandage applied.       Assessment & Plan:  Screening for thyroid disorder  TSH-Will follow regarding results.  Screening for diabetes mellitus POCT glycosylated hemoglobin (Hb A1C), POCT glucose (manual entry), Comprehensive metabolic panel -Pre-DM value.  Advised patient to keep up her exercise regimen, and appropriate diet.  She is willing to do these changes with relief of her hip pain  Pure hypercholesterolemia  Lipid panel-Will follow up with patient regarding results.    Hip pain, right   DG Hip Complete Right This presents with bursitic symptoms.  No fractures visualized.   -Depomedrol injection given.  Patient states that she already feels some relief.  Procedure summarized above.   -Given verbal and written instructions on exercises condition and strengthen the leg to avoid chronic pains and overburden.    Advised to take ibuprofen for pain.     Asthma, chronic, moderate persistent, uncomplicated beclomethasone (QVAR) 40 MCG/ACT inhaler Preventative/Maintanence Inhaler to be taken twice daily.  She will contact us if symptoms do not improve.  She will continue PRN rescue inhaler.    Ivar Drape, PA-C [facility] 10/21/20151:20 PM

## 2014-03-26 NOTE — Patient Instructions (Signed)
Trochanteric Bursitis You have hip pain due to trochanteric bursitis. Bursitis means that the sack near the outside of the hip is filled with fluid and inflamed. This sack is made up of protective soft tissue. The pain from trochanteric bursitis can be severe and keep you from sleep. It can radiate to the buttocks or down the outside of the thigh to the knee. The pain is almost always worse when rising from the seated or lying position and with walking. Pain can improve after you take a few steps. It happens more often in people with hip joint and lumbar spine problems, such as arthritis or previous surgery. Very rarely the trochanteric bursa can become infected, and antibiotics and/or surgery may be needed. Treatment often includes an injection of local anesthetic mixed with cortisone medicine. This medicine is injected into the area where it is most tender over the hip. Repeat injections may be necessary if the response to treatment is slow. You can apply ice packs over the tender area for 30 minutes every 2 hours for the next few days. Anti-inflammatory and/or narcotic pain medicine may also be helpful. Limit your activity for the next few days if the pain continues. See your caregiver in 5-10 days if you are not greatly improved.  SEEK IMMEDIATE MEDICAL CARE IF:  You develop severe pain, fever, or increased redness.  You have pain that radiates below the knee. EXERCISES STRETCHING EXERCISES - Trochanteric Bursitis  These exercises may help you when beginning to rehabilitate your injury. Your symptoms may resolve with or without further involvement from your physician, physical therapist, or athletic trainer. While completing these exercises, remember:   Restoring tissue flexibility helps normal motion to return to the joints. This allows healthier, less painful movement and activity.  An effective stretch should be held for at least 30 seconds.  A stretch should never be painful. You should only  feel a gentle lengthening or release in the stretched tissue. STRETCH - Iliotibial Band  On the floor or bed, lie on your side so your injured leg is on top. Bend your knee and grab your ankle.  Slowly bring your knee back so that your thigh is in line with your trunk. Keep your heel at your buttocks and gently arch your back so your head, shoulders and hips line up.  Slowly lower your leg so that your knee approaches the floor/bed until you feel a gentle stretch on the outside of your thigh. If you do not feel a stretch and your knee will not fall farther, place the heel of your opposite foot on top of your knee and pull your thigh down farther.  Hold this stretch for __________ seconds.  Repeat __________ times. Complete this exercise __________ times per day. STRETCH - Hamstrings, Supine   Lie on your back. Loop a belt or towel over the ball of your foot as shown.  Straighten your knee and slowly pull on the belt to raise your injured leg. Do not allow the knee to bend. Keep your opposite leg flat on the floor.  Raise the leg until you feel a gentle stretch behind your knee or thigh. Hold this position for __________ seconds.  Repeat __________ times. Complete this stretch __________ times per day. STRETCH - Quadriceps, Prone   Lie on your stomach on a firm surface, such as a bed or padded floor.  Bend your knee and grasp your ankle. If you are unable to reach your ankle or pant leg, use a belt  around your foot to lengthen your reach.  Gently pull your heel toward your buttocks. Your knee should not slide out to the side. You should feel a stretch in the front of your thigh and/or knee.  Hold this position for __________ seconds.  Repeat __________ times. Complete this stretch __________ times per day. STRETCHING - Hip Flexors, Lunge Half kneel with your knee on the floor and your opposite knee bent and directly over your ankle.  Keep good posture with your head over your  shoulders. Tighten your buttocks to point your tailbone downward; this will prevent your back from arching too much.  You should feel a gentle stretch in the front of your thigh and/or hip. If you do not feel any resistance, slightly slide your opposite foot forward and then slowly lunge forward so your knee once again lines up over your ankle. Be sure your tailbone remains pointed downward.  Hold this stretch for __________ seconds.  Repeat __________ times. Complete this stretch __________ times per day. STRETCH - Adductors, Lunge  While standing, spread your legs.  Lean away from your injured leg by bending your opposite knee. You may rest your hands on your thigh for balance.  You should feel a stretch in your inner thigh. Hold for __________ seconds.  Repeat __________ times. Complete this exercise __________ times per day. Document Released: 06/30/2004 Document Revised: 10/07/2013 Document Reviewed: 09/04/2008 [facility] Patient Information 2015 [place], [place]. This information is not intended to replace advice given to you by your health care provider. Make sure you discuss any questions you have with your health care provider.

## 2014-03-27 ENCOUNTER — Encounter: Payer: Self-pay | Admitting: Family Medicine

## 2014-03-27 NOTE — Progress Notes (Signed)
History and physical examinations obtained with Ivar Drape, PA-C.  A/P:  1.  Asthma: worsening; restart QVAR therapy; continue Albuterol PRN.  Continue Singulair, nasal sprays to adequately treat allergic rhinitis symptoms.  2.  R hip pain/recurrent greater trochanteric bursitis: New.  S/p injection.  Recommend rest, icing, passive stretching.  3.  Hypercholesterolemia: stable; obtain labs; continue with exercise, dietary modification.  4.  Fatigue/fibromyalgias/screening thyroid: Obtain TSH. 5.  Glucose intolerance: stable HgbA1c and normal glucose.  Follow-up with PCP/[name] Jeffery in 3-4 months.

## 2014-04-02 ENCOUNTER — Other Ambulatory Visit (INDEPENDENT_AMBULATORY_CARE_PROVIDER_SITE_OTHER): Payer: Self-pay | Admitting: *Deleted

## 2014-04-02 DIAGNOSIS — R1032 Left lower quadrant pain: Secondary | ICD-10-CM

## 2014-04-04 ENCOUNTER — Encounter: Payer: Self-pay | Admitting: Physician Assistant

## 2014-04-07 ENCOUNTER — Other Ambulatory Visit: Payer: Self-pay

## 2014-04-08 ENCOUNTER — Ambulatory Visit: Payer: 59 | Admitting: Professional

## 2014-04-15 ENCOUNTER — Ambulatory Visit (INDEPENDENT_AMBULATORY_CARE_PROVIDER_SITE_OTHER): Payer: 59 | Admitting: Professional

## 2014-04-15 DIAGNOSIS — F411 Generalized anxiety disorder: Secondary | ICD-10-CM

## 2014-04-29 LAB — HM MAMMOGRAPHY

## 2014-05-02 ENCOUNTER — Encounter: Payer: Self-pay | Admitting: Physician Assistant

## 2014-05-06 ENCOUNTER — Other Ambulatory Visit: Payer: Self-pay | Admitting: Physician Assistant

## 2014-05-07 ENCOUNTER — Encounter: Payer: Self-pay | Admitting: Physician Assistant

## 2014-05-07 NOTE — Telephone Encounter (Signed)
Faxed

## 2014-05-13 ENCOUNTER — Ambulatory Visit (INDEPENDENT_AMBULATORY_CARE_PROVIDER_SITE_OTHER): Payer: 59 | Admitting: Professional

## 2014-05-13 DIAGNOSIS — F411 Generalized anxiety disorder: Secondary | ICD-10-CM

## 2014-06-03 ENCOUNTER — Other Ambulatory Visit (INDEPENDENT_AMBULATORY_CARE_PROVIDER_SITE_OTHER): Payer: Self-pay

## 2014-06-03 ENCOUNTER — Telehealth (INDEPENDENT_AMBULATORY_CARE_PROVIDER_SITE_OTHER): Payer: Self-pay

## 2014-06-03 DIAGNOSIS — R1030 Lower abdominal pain, unspecified: Secondary | ICD-10-CM

## 2014-06-03 NOTE — Telephone Encounter (Signed)
Called and left message for patient to call our office.  Please make patient aware that Dr. Hassell Done has ordered and [place] to evaluate her groin pain since insurance denied covering previously ordered CT

## 2014-06-05 ENCOUNTER — Ambulatory Visit
Admission: RE | Admit: 2014-06-05 | Discharge: 2014-06-05 | Disposition: A | Discharge status: Home / Self Care | Payer: Medicare Other | Source: Ambulatory Visit | Attending: Surgery | Admitting: Surgery

## 2014-06-05 DIAGNOSIS — R1030 Lower abdominal pain, unspecified: Secondary | ICD-10-CM

## 2014-06-17 ENCOUNTER — Ambulatory Visit (INDEPENDENT_AMBULATORY_CARE_PROVIDER_SITE_OTHER): Payer: 59 | Admitting: Professional

## 2014-06-17 DIAGNOSIS — F411 Generalized anxiety disorder: Secondary | ICD-10-CM

## 2014-06-19 ENCOUNTER — Encounter (HOSPITAL_COMMUNITY): Payer: Self-pay | Admitting: Obstetrics & Gynecology

## 2014-07-01 ENCOUNTER — Ambulatory Visit: Payer: 59 | Admitting: Professional

## 2014-07-01 ENCOUNTER — Ambulatory Visit: Payer: Medicare Other | Admitting: Physician Assistant

## 2014-07-03 ENCOUNTER — Ambulatory Visit: Payer: Medicare Other | Admitting: Physician Assistant

## 2014-07-05 ENCOUNTER — Encounter (HOSPITAL_COMMUNITY): Payer: Self-pay | Admitting: Emergency Medicine

## 2014-07-05 ENCOUNTER — Inpatient Hospital Stay (HOSPITAL_COMMUNITY)
Admission: EM | Admit: 2014-07-05 | Discharge: 2014-07-09 | DRG: 244 | Disposition: A | Discharge status: Home / Self Care | Payer: Medicare Other | Attending: Cardiovascular Disease | Admitting: Cardiovascular Disease

## 2014-07-05 ENCOUNTER — Emergency Department (HOSPITAL_COMMUNITY): Payer: Medicare Other

## 2014-07-05 DIAGNOSIS — Z8249 Family history of ischemic heart disease and other diseases of the circulatory system: Secondary | ICD-10-CM | POA: Diagnosis not present

## 2014-07-05 DIAGNOSIS — K219 Gastro-esophageal reflux disease without esophagitis: Secondary | ICD-10-CM | POA: Diagnosis present

## 2014-07-05 DIAGNOSIS — R079 Chest pain, unspecified: Secondary | ICD-10-CM | POA: Diagnosis present

## 2014-07-05 DIAGNOSIS — F419 Anxiety disorder, unspecified: Secondary | ICD-10-CM | POA: Diagnosis present

## 2014-07-05 DIAGNOSIS — I495 Sick sinus syndrome: Secondary | ICD-10-CM | POA: Diagnosis present

## 2014-07-05 DIAGNOSIS — Z7982 Long term (current) use of aspirin: Secondary | ICD-10-CM | POA: Diagnosis not present

## 2014-07-05 DIAGNOSIS — I10 Essential (primary) hypertension: Secondary | ICD-10-CM | POA: Diagnosis present

## 2014-07-05 DIAGNOSIS — F329 Major depressive disorder, single episode, unspecified: Secondary | ICD-10-CM | POA: Diagnosis present

## 2014-07-05 DIAGNOSIS — Z79899 Other long term (current) drug therapy: Secondary | ICD-10-CM | POA: Diagnosis not present

## 2014-07-05 DIAGNOSIS — Z87891 Personal history of nicotine dependence: Secondary | ICD-10-CM

## 2014-07-05 DIAGNOSIS — K589 Irritable bowel syndrome without diarrhea: Secondary | ICD-10-CM | POA: Diagnosis present

## 2014-07-05 DIAGNOSIS — M797 Fibromyalgia: Secondary | ICD-10-CM | POA: Diagnosis present

## 2014-07-05 DIAGNOSIS — Z7951 Long term (current) use of inhaled steroids: Secondary | ICD-10-CM | POA: Diagnosis not present

## 2014-07-05 DIAGNOSIS — J45909 Unspecified asthma, uncomplicated: Secondary | ICD-10-CM | POA: Diagnosis present

## 2014-07-05 DIAGNOSIS — R001 Bradycardia, unspecified: Secondary | ICD-10-CM | POA: Diagnosis present

## 2014-07-05 DIAGNOSIS — Z959 Presence of cardiac and vascular implant and graft, unspecified: Secondary | ICD-10-CM

## 2014-07-05 HISTORY — DX: Bradycardia, unspecified: R00.1

## 2014-07-05 HISTORY — DX: Hyperlipidemia, unspecified: E78.5

## 2014-07-05 LAB — CBC
HEMATOCRIT: 37.7 % (ref 36.0–46.0)
HEMOGLOBIN: 12.3 g/dL (ref 12.0–15.0)
MCH: 27 pg (ref 26.0–34.0)
MCHC: 32.6 g/dL (ref 30.0–36.0)
MCV: 82.9 fL (ref 78.0–100.0)
Platelets: 332 10*3/uL (ref 150–400)
RBC: 4.55 MIL/uL (ref 3.87–5.11)
RDW: 13 % (ref 11.5–15.5)
WBC: 5.7 10*3/uL (ref 4.0–10.5)

## 2014-07-05 LAB — I-STAT TROPONIN, ED: Troponin i, poc: 0 ng/mL (ref 0.00–0.08)

## 2014-07-05 LAB — BASIC METABOLIC PANEL
Anion gap: 6 (ref 5–15)
BUN: 7 mg/dL (ref 6–23)
CO2: 30 mmol/L (ref 19–32)
Calcium: 9.6 mg/dL (ref 8.4–10.5)
Chloride: 106 mmol/L (ref 96–112)
Creatinine, Ser: 0.68 mg/dL (ref 0.50–1.10)
GFR calc Af Amer: >90 mL/min (ref >90)
GFR calc non Af Amer: >90 mL/min (ref >90)
Glucose, Bld: 123 mg/dL — ABNORMAL HIGH (ref 70–99)
POTASSIUM: 4 mmol/L (ref 3.5–5.1)
SODIUM: 142 mmol/L (ref 135–145)

## 2014-07-05 LAB — BRAIN NATRIURETIC PEPTIDE: B NATRIURETIC PEPTIDE 5: 47.5 pg/mL (ref 0.0–100.0)

## 2014-07-05 MED ORDER — DIAZEPAM 5 MG PO TABS
5.0000 mg | ORAL_TABLET | Freq: Every day | ORAL | Status: DC
  Filled 2014-07-05 (×2): qty 1

## 2014-07-05 MED ORDER — OXYCODONE-ACETAMINOPHEN 5-325 MG PO TABS
1.0000 | ORAL_TABLET | Freq: Three times a day (TID) | ORAL | Status: DC | PRN
  Administered 2014-07-05 – 2014-07-09 (×10): 1 via ORAL
  Filled 2014-07-05 (×10): qty 1

## 2014-07-05 MED ORDER — FLUTICASONE PROPIONATE 50 MCG/ACT NA SUSP
1.0000 | Freq: Every day | NASAL | Status: DC
  Administered 2014-07-06 – 2014-07-09 (×4): 1 via NASAL
  Filled 2014-07-05: qty 16

## 2014-07-05 MED ORDER — MELOXICAM 15 MG PO TABS
15.0000 mg | ORAL_TABLET | Freq: Every day | ORAL | Status: DC
  Administered 2014-07-06 – 2014-07-09 (×3): 15 mg via ORAL
  Filled 2014-07-05 (×5): qty 1

## 2014-07-05 MED ORDER — ONDANSETRON HCL 4 MG/2ML IJ SOLN
4.0000 mg | Freq: Four times a day (QID) | INTRAMUSCULAR | Status: DC | PRN
  Administered 2014-07-07: 4 mg via INTRAVENOUS
  Filled 2014-07-05: qty 2

## 2014-07-05 MED ORDER — ALBUTEROL SULFATE HFA 108 (90 BASE) MCG/ACT IN AERS: 2.0000 | INHALATION_SPRAY | Freq: Four times a day (QID) | RESPIRATORY_TRACT | Status: DC | PRN

## 2014-07-05 MED ORDER — MONTELUKAST SODIUM 10 MG PO TABS
10.0000 mg | ORAL_TABLET | Freq: Every day | ORAL | Status: DC
  Administered 2014-07-06 – 2014-07-09 (×4): 10 mg via ORAL
  Filled 2014-07-05 (×5): qty 1

## 2014-07-05 MED ORDER — HEPARIN BOLUS VIA INFUSION
3000.0000 [IU] | Freq: Once | INTRAVENOUS | Status: AC
  Administered 2014-07-05: 3000 [IU] via INTRAVENOUS
  Filled 2014-07-05: qty 3000

## 2014-07-05 MED ORDER — ESCITALOPRAM OXALATE 5 MG PO TABS
5.0000 mg | ORAL_TABLET | Freq: Every day | ORAL | Status: DC
  Administered 2014-07-06 – 2014-07-09 (×4): 5 mg via ORAL
  Filled 2014-07-05 (×4): qty 1

## 2014-07-05 MED ORDER — DOCUSATE SODIUM 100 MG PO CAPS
100.0000 mg | ORAL_CAPSULE | Freq: Every day | ORAL | Status: DC
  Administered 2014-07-06 – 2014-07-09 (×4): 100 mg via ORAL
  Filled 2014-07-05 (×5): qty 1

## 2014-07-05 MED ORDER — PANTOPRAZOLE SODIUM 40 MG PO TBEC
40.0000 mg | DELAYED_RELEASE_TABLET | Freq: Every day | ORAL | Status: DC
  Administered 2014-07-06 – 2014-07-09 (×4): 40 mg via ORAL
  Filled 2014-07-05 (×4): qty 1

## 2014-07-05 MED ORDER — NITROGLYCERIN 0.4 MG SL SUBL
0.4000 mg | SUBLINGUAL_TABLET | SUBLINGUAL | Status: DC | PRN
  Administered 2014-07-05: 0.4 mg via SUBLINGUAL
  Filled 2014-07-05: qty 1

## 2014-07-05 MED ORDER — KETOROLAC TROMETHAMINE 30 MG/ML IJ SOLN
30.0000 mg | Freq: Once | INTRAMUSCULAR | Status: AC
  Administered 2014-07-05: 30 mg via INTRAVENOUS
  Filled 2014-07-05: qty 1

## 2014-07-05 MED ORDER — ALBUTEROL SULFATE (2.5 MG/3ML) 0.083% IN NEBU: 2.5000 mg | INHALATION_SOLUTION | Freq: Four times a day (QID) | RESPIRATORY_TRACT | Status: DC | PRN

## 2014-07-05 MED ORDER — FENOFIBRATE 160 MG PO TABS
160.0000 mg | ORAL_TABLET | Freq: Every day | ORAL | Status: DC
  Administered 2014-07-06 – 2014-07-09 (×4): 160 mg via ORAL
  Filled 2014-07-05 (×5): qty 1

## 2014-07-05 MED ORDER — ASPIRIN 81 MG PO CHEW
81.0000 mg | CHEWABLE_TABLET | Freq: Every day | ORAL | Status: DC
  Administered 2014-07-06 – 2014-07-07 (×2): 81 mg via ORAL
  Filled 2014-07-05 (×4): qty 1

## 2014-07-05 MED ORDER — HEPARIN (PORCINE) IN NACL 100-0.45 UNIT/ML-% IJ SOLN
950.0000 [IU]/h | INTRAMUSCULAR | Status: DC
  Administered 2014-07-05: 900 [IU]/h via INTRAVENOUS
  Filled 2014-07-05 (×3): qty 250

## 2014-07-05 MED ORDER — ADULT MULTIVITAMIN W/MINERALS CH
1.0000 | ORAL_TABLET | Freq: Every day | ORAL | Status: DC
  Administered 2014-07-06 – 2014-07-09 (×4): 1 via ORAL
  Filled 2014-07-05 (×4): qty 1

## 2014-07-05 NOTE — H&P (Signed)
Referring Physician:  [name] is an 59 y.o. female.                       Chief Complaint: Chest pain and weakness.  HPI: 59 year old female past history is below who has known sinus bradycardia for which she sees cardiology who presents to ED complaining of persistent fatigue for the past 3-4 days with intermittent indigestion and malaise. Also started having left sided chest pain radiating to her left shoulder/arm today. Chest pain is intermittent and comes on for 5-7 minutes before resolving spontaneously. Patient reports the pain comes on she feels nauseous and short of breath. Patient denies any diaphoresis. Pain is described as achy. Pain in her arm is described as sharp. In ER monitor showed sinus bradycardia with heart rate in 30's.    Past Medical History  Diagnosis Date  . Angina   . GERD (gastroesophageal reflux disease)   . Arthritis   . Anxiety   . Fibromyalgia   . Bursitis     right hip  . Anal fissure 02/2012  . Hypertension     under control, has been on med. since 2010  . Tarsal tunnel syndrome of left side   . Complication of anesthesia     difficult to wake up post-op  . Depression   . Asthma     daily and prn inhalers  . Irritable bowel syndrome (IBS)   . Cardiomegaly   . TMJ (temporomandibular joint syndrome)     left  . Dental crowns present   . Wears partial dentures     upper and lower  . High cholesterol   . Pneumonia ~ 06/2012  . Chronic bronchitis     "get it about q year" (12/27/2012)  . Shortness of breath     "none since I quit smoking in 06/2012" (12/27/2012)  . Sinus headache     "@ times" (12/27/2012)  . Tarsal tunnel syndrome of left side   . Hepatitis A infection 1979  . Allergy   . Childhood asthma 05/30/2011      Past Surgical History  Procedure Laterality Date  . Shoulder arthroscopy w/ rotator cuff repair Left 1999  . Bilateral salpingoophorectomy Bilateral 04/2011  . Cystoscopy w/ ureteral stent placement  08/04/2009  .  Laparoscopic lysis intestinal adhesions  08/04/2009  . Cardiac catheterization  10/08/2007  . Examination under anesthesia  02/17/2012    Procedure: EXAM UNDER ANESTHESIA;  Surgeon: Pedro Earls, MD;  Location: [facility];  Service: General;  Laterality: N/A;  Exam under anesthesia with lateral internal sphincterotomy  . Sphincterotomy  02/17/2012    Procedure: SPHINCTEROTOMY;  Surgeon: Pedro Earls, MD;  Location: [place];  Service: General;  Laterality: N/A;  . Abdominal hysterectomy  09/1994    partial  . Appendectomy    . Cesarean section    . Small intestine surgery      Family History  Problem Relation Age of Onset  . Cancer Mother     liver  . Diabetes Mother   . Hyperlipidemia Mother   . Hypertension Mother   . Cancer Maternal Grandmother     breast  . Anesthesia problems Sister     hard to wake up post-op  . Heart disease Father   . Diabetes Brother   . Hyperlipidemia Brother   . Cancer Maternal Grandfather     colon   Social History:  reports that she quit smoking  about 2 years ago. Her smoking use included Cigarettes. She has a 18.5 pack-year smoking history. She has never used smokeless tobacco. She reports that she does not drink alcohol or use illicit drugs.  Allergies:  Allergies  Allergen Reactions  . Codeine Other (See Comments)    HALLUCINATIONS   . Propoxyphene N-Acetaminophen Other (See Comments)    HALLUCINATIONS  . Sulfonamide Derivatives Hives, Itching and Swelling  . Alka-Seltzer [Aspirin Effervescent] Other (See Comments)    "like she's fading away"  . Norvasc [Amlodipine Besylate]   . Iohexol Nausea Only    JITTERY    . Latex Hives and Itching     (Not in a hospital admission)  Results for orders placed or performed during the hospital encounter of 07/05/14 (from the past 48 hour(s))  CBC     Status: None   Collection Time: 07/05/14  2:16 PM  Result Value Ref Range   WBC 5.7 4.0 - 10.5 K/uL   RBC  4.55 3.87 - 5.11 MIL/uL   Hemoglobin 12.3 12.0 - 15.0 g/dL   HCT 37.7 36.0 - 46.0 %   MCV 82.9 78.0 - 100.0 fL   MCH 27.0 26.0 - 34.0 pg   MCHC 32.6 30.0 - 36.0 g/dL   RDW 13.0 11.5 - 15.5 %   Platelets 332 150 - 400 K/uL  Basic metabolic panel     Status: Abnormal   Collection Time: 07/05/14  2:16 PM  Result Value Ref Range   Sodium 142 135 - 145 mmol/L   Potassium 4.0 3.5 - 5.1 mmol/L   Chloride 106 96 - 112 mmol/L   CO2 30 19 - 32 mmol/L   Glucose, Bld 123 (H) 70 - 99 mg/dL   BUN 7 6 - 23 mg/dL   Creatinine, Ser 0.68 0.50 - 1.10 mg/dL   Calcium 9.6 8.4 - 10.5 mg/dL   GFR calc non Af Amer >90 >90 mL/min   GFR calc Af Amer >90 >90 mL/min    Comment: (NOTE) The eGFR has been calculated using the CKD EPI equation. This calculation has not been validated in all clinical situations. eGFR's persistently <90 mL/min signify possible Chronic Kidney Disease.    Anion gap 6 5 - 15  BNP (order ONLY if patient complains of dyspnea/SOB AND you have documented it for THIS visit)     Status: None   Collection Time: 07/05/14  2:16 PM  Result Value Ref Range   B Natriuretic Peptide 47.5 0.0 - 100.0 pg/mL  I-stat troponin, ED (not at [facility])     Status: None   Collection Time: 07/05/14  2:50 PM  Result Value Ref Range   Troponin i, poc 0.00 0.00 - 0.08 ng/mL   Comment 3            Comment: Due to the release kinetics of cTnI, a negative result within the first hours of the onset of symptoms does not rule out myocardial infarction with certainty. If myocardial infarction is still suspected, repeat the test at appropriate intervals.    Dg Chest 2 View  07/05/2014   CLINICAL DATA:  Chest pain, shortness of breath and nausea for 3 days.  EXAM: CHEST  2 VIEW  COMPARISON:  Single view of the chest 03/10/2014 and 12/27/2012. CT chest 10/05/2012.  FINDINGS: Heart size is upper normal. The lungs are clear. There is no pneumothorax or pleural effusion. No focal bony abnormality is identified.   IMPRESSION: No acute disease.   Electronically Signed   By:  Inge Rise M.D.   On: 07/05/2014 15:53    Review Of Systems Constitutional: Positive for chills, activity change and fatigue. Negative for fever.  HENT: Negative for congestion, rhinorrhea and sore throat.  Eyes: Negative for visual disturbance.  Respiratory: Positive for shortness of breath.  Cardiovascular: Positive for chest pain. Negative for palpitations and leg swelling.  Gastrointestinal: Positive for nausea. Negative for vomiting, abdominal pain, diarrhea and constipation.  Genitourinary: Negative for dysuria, hematuria, vaginal bleeding and vaginal discharge.  Musculoskeletal: Negative for back pain and neck pain.  Skin: Negative for rash.  Neurological: Negative for weakness and headaches.   Blood pressure 136/85, pulse 41, temperature 98.7 F (37.1 C), temperature source Oral, resp. rate 16, height _0  (1.702 m), SpO2 100 %.  Physical Exam  Constitutional: She appears well-developed and well-nourished. No distress.  HENT: Normocephalic and atraumatic. Brown eyes, conj-pink and sclera-white.  Neck: Normal range of motion.  Cardiovascular: Regular but bradycardic rhythm, normal heart sounds and intact distal pulses. No murmur heard.  Pulmonary/Chest: Effort normal. She has minimal expiratory wheezes. She has no rales. She exhibits no tenderness.  Abdominal: Soft. Bowel sounds are normal. She exhibits no distension.  Musculoskeletal: Normal range of motion. She exhibits no edema or tenderness.  Neurological: She is alert and oriented to person, place, and time. No cranial nerve deficit.  Skin: Skin is warm and dry. She is not diaphoretic.  Psychiatric: She has a normal mood and affect.  Nursing note and vitals reviewed.  Assessment/Plan Chest pain Sinus bradycardia Weakness and dizziness from above Anxiety Fibromyalgia  Admit/Check TSH/Hold bentyl. Possible external pacemaker support and possible  permanent pacemaker.  Birdie Riddle, MD  07/05/2014, 5:31 PM

## 2014-07-05 NOTE — ED Notes (Signed)
Pt refusing repeat nitro.

## 2014-07-05 NOTE — ED Notes (Signed)
Pt. Stated, My heart started aching yesterday and I had some SOB too.  Its a ching feeling.  I do have anxiety but I don't think that's it.

## 2014-07-05 NOTE — ED Provider Notes (Signed)
CSN: [id number]     Arrival date & time 07/05/14  1404 History   First MD Initiated Contact with Patient 07/05/14 1507     Chief Complaint  Patient presents with  . Chest Pain  . Shortness of Breath  . Nausea     (Consider location/radiation/quality/duration/timing/severity/associated sxs/prior Treatment) HPI 59 year old female past history is below who has known sinus bradycardia for which she sees cardiology who presents to ED complaining of persistent fatigue for the past 3-4 days with intermittent indigestion and malaise. Also started having left sided chest pain radiating to her left shoulder/arm today. Chest pain is intermittent and comes on for 5-7 minutes before resolving spontaneously. Patient reports the pain comes on she feels nauseous and short of breath. Patient denies any diaphoresis. Pain is described as achy. Pain in her arm is described as sharp. Patient states her symptoms are similar to her fibromyalgia in the past but worse. Denies any leg pain or swelling. Recent infections, URI symptoms, urinary symptoms, abdominal pain. Patient states she has attempted Percocet for her pain which helped some. No other complaints at this time.   Past Medical History  Diagnosis Date  . Angina   . GERD (gastroesophageal reflux disease)   . Arthritis   . Anxiety   . Fibromyalgia   . Bursitis     right hip  . Anal fissure 02/2012  . Hypertension     under control, has been on med. since 2010  . Tarsal tunnel syndrome of left side   . Complication of anesthesia     difficult to wake up post-op  . Depression   . Asthma     daily and prn inhalers  . Irritable bowel syndrome (IBS)   . Cardiomegaly   . TMJ (temporomandibular joint syndrome)     left  . Dental crowns present   . Wears partial dentures     upper and lower  . High cholesterol   . Pneumonia ~ 06/2012  . Chronic bronchitis     "get it about q year" (12/27/2012)  . Shortness of breath     "none since I quit smoking  in 06/2012" (12/27/2012)  . Sinus headache     "@ times" (12/27/2012)  . Tarsal tunnel syndrome of left side   . Hepatitis A infection 1979  . Allergy   . Childhood asthma 05/30/2011   Past Surgical History  Procedure Laterality Date  . Shoulder arthroscopy w/ rotator cuff repair Left 1999  . Bilateral salpingoophorectomy Bilateral 04/2011  . Cystoscopy w/ ureteral stent placement  08/04/2009  . Laparoscopic lysis intestinal adhesions  08/04/2009  . Cardiac catheterization  10/08/2007  . Examination under anesthesia  02/17/2012    Procedure: EXAM UNDER ANESTHESIA;  Surgeon: Pedro Earls, MD;  Location: [place];  Service: General;  Laterality: N/A;  Exam under anesthesia with lateral internal sphincterotomy  . Sphincterotomy  02/17/2012    Procedure: SPHINCTEROTOMY;  Surgeon: Pedro Earls, MD;  Location: [place];  Service: General;  Laterality: N/A;  . Abdominal hysterectomy  09/1994    partial  . Appendectomy    . Cesarean section    . Small intestine surgery     Family History  Problem Relation Age of Onset  . Cancer Mother     liver  . Diabetes Mother   . Hyperlipidemia Mother   . Hypertension Mother   . Cancer Maternal Grandmother     breast  . Anesthesia problems Sister  hard to wake up post-op  . Heart disease Father   . Diabetes Brother   . Hyperlipidemia Brother   . Cancer Maternal Grandfather     colon   History  Substance Use Topics  . Smoking status: Former Smoker -- 0.50 packs/day for 37 years    Types: Cigarettes    Quit date: 06/05/2012  . Smokeless tobacco: Never Used  . Alcohol Use: No   OB History    No data available     Review of Systems  Constitutional: Positive for chills, activity change and fatigue. Negative for fever.  HENT: Negative for congestion, rhinorrhea and sore throat.   Eyes: Negative for visual disturbance.  Respiratory: Positive for shortness of breath.   Cardiovascular: Positive for chest  pain. Negative for palpitations and leg swelling.  Gastrointestinal: Positive for nausea. Negative for vomiting, abdominal pain, diarrhea and constipation.  Genitourinary: Negative for dysuria, hematuria, vaginal bleeding and vaginal discharge.  Musculoskeletal: Negative for back pain and neck pain.  Skin: Negative for rash.  Neurological: Negative for weakness and headaches.  All other systems reviewed and are negative.     Allergies  Codeine; Propoxyphene n-acetaminophen; Sulfonamide derivatives; Alka-seltzer; Norvasc; Iohexol; and Latex  Home Medications   Prior to Admission medications   Medication Sig Start Date End Date Taking? Authorizing Provider  albuterol (PROVENTIL HFA;VENTOLIN HFA) 108 (90 BASE) MCG/ACT inhaler Inhale 2 puffs into the lungs every 6 (six) hours as needed for wheezing. 08/15/13  Yes [name] Janalee Dane, PA-C  ALPRAZolam (XANAX) 1 MG tablet TAKE 1 TABLET TWICE A DAY AS NEEDED FOR ANXIETY 05/07/14  Yes [name] S Jeffery, PA-C  aspirin 81 MG chewable tablet Chew 81 mg by mouth daily.   Yes Historical Provider, MD  beclomethasone (QVAR) 40 MCG/ACT inhaler Inhale 1-2 puffs into the lungs 2 (two) times daily. 03/26/14 03/27/15 Yes Stephanie D English, PA  calcipotriene-betamethasone (TACLONEX) ointment Apply 1 application topically daily.   Yes Historical Provider, MD  dexlansoprazole (DEXILANT) 60 MG capsule Take 60 mg by mouth daily as needed (for acid reflux).   Yes Historical Provider, MD  diazepam (VALIUM) 5 MG tablet Take 1 tablet (5 mg total) by mouth daily. 07/18/13  Yes [name] S Jeffery, PA-C  docusate sodium (COLACE) 100 MG capsule Take 100 mg by mouth daily.    Yes Historical Provider, MD  escitalopram (LEXAPRO) 5 MG tablet Take 5 mg by mouth daily.   Yes Historical Provider, MD  fenofibrate 160 MG tablet Take 1 tablet (160 mg total) by mouth daily. 10/22/13  Yes [name] S Jeffery, PA-C  fluticasone (FLONASE) 50 MCG/ACT nasal spray USE 2 SPRAYS INTO EACH NOSTRIL  EVERY DAY   Yes [name] S Jeffery, PA-C  hydrOXYzine (ATARAX/VISTARIL) 25 MG tablet Take 0.5-2 tablets (12.5-50 mg total) by mouth at bedtime as needed for anxiety (insomnia). 07/18/13  Yes [name] S Jeffery, PA-C  ipratropium (ATROVENT) 0.03 % nasal spray USE 2 SPRAYS INTO THE NOSE EVERY 12 (TWELVE) HOURS.   Yes [name] S Jeffery, PA-C  ketorolac (ACULAR) 0.4 % SOLN Place 1 drop into both eyes 4 (four) times daily. 08/15/13  Yes [name] S Jeffery, PA-C  Linaclotide (LINZESS) 145 MCG CAPS Take 145 mcg by mouth daily.   Yes Historical Provider, MD  meloxicam (MOBIC) 15 MG tablet Take 1 tablet (15 mg total) by mouth daily. 08/15/13  Yes [name] S Jeffery, PA-C  metaxalone (SKELAXIN) 800 MG tablet Take 400-800 mg by mouth daily as needed for pain (for muscle spasms).  Yes Historical Provider, MD  montelukast (SINGULAIR) 10 MG tablet Take 1 tablet (10 mg total) by mouth daily. 10/22/13  Yes [name] S Jeffery, PA-C  Multiple Vitamin (MULTIVITAMIN) tablet Take 1 tablet by mouth daily.   Yes Historical Provider, MD  OVER THE COUNTER MEDICATION Pre menopause tablets taking bid   Yes Historical Provider, MD  oxyCODONE-acetaminophen (PERCOCET) 5-325 MG per tablet Take 1 tablet by mouth every 8 (eight) hours as needed for pain.    Yes Historical Provider, MD  Probiotic Product (PROBIOTIC DAILY) CAPS Take 1 capsule by mouth 2 (two) times a week.   Yes Historical Provider, MD  ranitidine (ZANTAC) 150 MG tablet Take 150 mg by mouth as needed for heartburn.    Yes Historical Provider, MD  zolpidem (AMBIEN) 10 MG tablet Take 10 mg by mouth at bedtime as needed for sleep.  09/28/13  Yes Historical Provider, MD  dicyclomine (BENTYL) 10 MG capsule Take 10 mg by mouth 3 (three) times daily as needed (for stomach).     Historical Provider, MD  furosemide (LASIX) 20 MG tablet Take 1 tablet (20 mg total) by mouth daily as needed (leg swelling). 10/22/13   [name] S Jeffery, PA-C  NITROSTAT 0.4 MG SL tablet Place 0.4 mg under the  tongue every 5 (five) minutes as needed for chest pain. For chest pain. 11/24/11   Historical Provider, MD   BP 136/85 mmHg  Pulse 41  Temp(Src) 98.7 F (37.1 C) (Oral)  Resp 16  Ht 5' 7"  (1.702 m)  SpO2 100% Physical Exam  Constitutional: She is oriented to person, place, and time. She appears well-developed and well-nourished. No distress.  HENT:  Head: Normocephalic and atraumatic.  Eyes: Conjunctivae are normal.  Neck: Normal range of motion.  Cardiovascular: Regular rhythm, normal heart sounds and intact distal pulses.  Bradycardia present.   No murmur heard. Pulmonary/Chest: Effort normal and breath sounds normal. No respiratory distress. She has no wheezes. She has no rales. She exhibits no tenderness.  Abdominal: Soft. Bowel sounds are normal. She exhibits no distension.  Musculoskeletal: Normal range of motion. She exhibits no edema or tenderness.  Neurological: She is alert and oriented to person, place, and time. No cranial nerve deficit.  Skin: Skin is warm and dry. She is not diaphoretic.  Psychiatric: She has a normal mood and affect.  Nursing note and vitals reviewed.   ED Course  Procedures (including critical care time) Labs Review Labs Reviewed  BASIC METABOLIC PANEL - Abnormal; Notable for the following:    Glucose, Bld 123 (*)    All other components within normal limits  MRSA PCR SCREENING  CBC  BRAIN NATRIURETIC PEPTIDE  BASIC METABOLIC PANEL  CBC  PROTIME-INR  HEPARIN LEVEL (UNFRACTIONATED)  LIPID PANEL  I-STAT TROPOININ, ED    Imaging Review Dg Chest 2 View  07/05/2014   CLINICAL DATA:  Chest pain, shortness of breath and nausea for 3 days.  EXAM: CHEST  2 VIEW  COMPARISON:  Single view of the chest 03/10/2014 and 12/27/2012. CT chest 10/05/2012.  FINDINGS: Heart size is upper normal. The lungs are clear. There is no pneumothorax or pleural effusion. No focal bony abnormality is identified.  IMPRESSION: No acute disease.   Electronically Signed    By: Inge Rise M.D.   On: 07/05/2014 15:53     EKG Interpretation   Date/Time:  Saturday July 05 2014 14:07:41 EST Ventricular Rate:  46 PR Interval:  134 QRS Duration: 86 QT Interval:  428 QTC  Calculation: 374 R Axis:   61 Text Interpretation:  Marked sinus bradycardia ST \T\ T wave abnormality,  consider inferior ischemia ST \T\ T wave abnormality, consider  anterolateral ischemia Abnormal ECG changes noted when compared to prior  Confirmed by Christy Gentles  MD, DONALD ([id number]) on 07/05/2014 3:09:34 PM      MDM   Final diagnoses:  Chest pain, rule out acute myocardial infarction  Sinus bradycardia    [name] is a 59 y.o. female with H&P as above. HDS, NAD in ED. EKG on arrival shows T-wave inversions in V4 and V5 which appear to be new as well as sinus bradycardia which appears to be old for the patient. Patient is not currently having chest pain right now. While in ED patient has heart rate drop into the 30s. On review of old visits patient appears to have chronic sinus bradycardia. Clinical picture does not suggest ACS as this would be a very atypical presentation and workup does not suggest this. Symptoms could be attributed to her known fibromyalgia. Patient's symptoms do raise some concern for symptomatic bradycardia. Given new EKG changing in setting of symptomatic bradycardia, will discuss case with cardiology.   Cards to admit pt.  Clinical Impression: 1. Chest pain, rule out acute myocardial infarction   2. Sinus bradycardia    Pt seen in conjunction with Dr. Wilber Oliphant, [facility] Resident - PGY-2    Kirstie Peri, MD 07/06/14 3524  Sharyon Cable, MD 07/07/14 [phone]

## 2014-07-05 NOTE — ED Notes (Signed)
Pt states "I only take xanax at night, i dont take any other pills"

## 2014-07-05 NOTE — ED Provider Notes (Signed)
Patient seen/examined in the Emergency Department in conjunction with Resident Physician Provider [facility] Patient reports recent chest pain as well as fatigue  Exam : awake/alert, no distress but she has bradycardia on CV exam Plan: will admit.  I spoke to Dr.  Doylene Canard about admission.  Pt reports recent increased fatigue likely due to sinus bradycardia    Sharyon Cable, MD 07/05/14 1705

## 2014-07-05 NOTE — Progress Notes (Signed)
ANTICOAGULATION CONSULT NOTE - Initial Consult  Pharmacy Consult for heparin Indication: chest pain/ACS  Allergies  Allergen Reactions  . Codeine Other (See Comments)    HALLUCINATIONS   . Propoxyphene N-Acetaminophen Other (See Comments)    HALLUCINATIONS  . Sulfonamide Derivatives Hives, Itching and Swelling  . Alka-Seltzer [Aspirin Effervescent] Other (See Comments)    "like she's fading away"  . Norvasc [Amlodipine Besylate]   . Iohexol Nausea Only    JITTERY    . Latex Hives and Itching    Patient Measurements: Height: 5' 7"  (170.2 cm) Weight: 160 lb 15 oz (73 kg) IBW/kg (Calculated) : 61.6  Vital Signs: Temp: 98.7 F (37.1 C) (01/30 1412) Temp Source: Oral (01/30 1412) BP: 139/70 mmHg (01/30 2100) Pulse Rate: 41 (01/30 2100)  Labs:  Recent Labs  07/05/14 1416  HGB 12.3  HCT 37.7  PLT 332  CREATININE 0.68    Estimated Creatinine Clearance: 73.6 mL/min (by C-G formula based on Cr of 0.68).   Medical History: Past Medical History  Diagnosis Date  . Angina   . GERD (gastroesophageal reflux disease)   . Arthritis   . Anxiety   . Fibromyalgia   . Bursitis     right hip  . Anal fissure 02/2012  . Hypertension     under control, has been on med. since 2010  . Tarsal tunnel syndrome of left side   . Complication of anesthesia     difficult to wake up post-op  . Depression   . Asthma     daily and prn inhalers  . Irritable bowel syndrome (IBS)   . Cardiomegaly   . TMJ (temporomandibular joint syndrome)     left  . Dental crowns present   . Wears partial dentures     upper and lower  . High cholesterol   . Pneumonia ~ 06/2012  . Chronic bronchitis     "get it about q year" (12/27/2012)  . Shortness of breath     "none since I quit smoking in 06/2012" (12/27/2012)  . Sinus headache     "@ times" (12/27/2012)  . Tarsal tunnel syndrome of left side   . Hepatitis A infection 1979  . Allergy   . Childhood asthma 05/30/2011    Medications:   Prescriptions prior to admission  Medication Sig Dispense Refill Last Dose  . albuterol (PROVENTIL HFA;VENTOLIN HFA) 108 (90 BASE) MCG/ACT inhaler Inhale 2 puffs into the lungs every 6 (six) hours as needed for wheezing. 18 g 2 07/05/2014 at Unknown time  . aspirin 81 MG chewable tablet Chew 81 mg by mouth daily.   07/04/2014 at Unknown time  . beclomethasone (QVAR) 40 MCG/ACT inhaler Inhale 1-2 puffs into the lungs 2 (two) times daily. 1 Inhaler 12 07/05/2014 at Unknown time  . calcipotriene-betamethasone (TACLONEX) ointment Apply 1 application topically daily.   07/05/2014 at Unknown time  . dexlansoprazole (DEXILANT) 60 MG capsule Take 60 mg by mouth daily as needed (for acid reflux).   Past Month at Unknown time  . diazepam (VALIUM) 5 MG tablet Take 1 tablet (5 mg total) by mouth daily. 30 tablet 0 Past Month at Unknown time  . docusate sodium (COLACE) 100 MG capsule Take 100 mg by mouth daily.    07/05/2014 at Unknown time  . escitalopram (LEXAPRO) 5 MG tablet Take 5 mg by mouth daily.   07/05/2014 at Unknown time  . fenofibrate 160 MG tablet Take 1 tablet (160 mg total) by mouth daily. 90 tablet 3  07/04/2014 at Unknown time  . fluticasone (FLONASE) 50 MCG/ACT nasal spray USE 2 SPRAYS INTO EACH NOSTRIL EVERY DAY 16 g 2 Past Week at Unknown time  . hydrOXYzine (ATARAX/VISTARIL) 25 MG tablet Take 0.5-2 tablets (12.5-50 mg total) by mouth at bedtime as needed for anxiety (insomnia). 60 tablet 2 Past Month at Unknown time  . ipratropium (ATROVENT) 0.03 % nasal spray USE 2 SPRAYS INTO THE NOSE EVERY 12 (TWELVE) HOURS. 30 mL 9 Past Month at Unknown time  . ketorolac (ACULAR) 0.4 % SOLN Place 1 drop into both eyes 4 (four) times daily. 5 mL 0 Past Month at Unknown time  . Linaclotide (LINZESS) 145 MCG CAPS Take 145 mcg by mouth daily.   Past Month at Unknown time  . meloxicam (MOBIC) 15 MG tablet Take 1 tablet (15 mg total) by mouth daily. 30 tablet 1 07/05/2014 at Unknown time  . metaxalone (SKELAXIN) 800  MG tablet Take 400-800 mg by mouth daily as needed for pain (for muscle spasms).    Past Week at Unknown time  . montelukast (SINGULAIR) 10 MG tablet Take 1 tablet (10 mg total) by mouth daily. 90 tablet 3 07/04/2014 at Unknown time  . Multiple Vitamin (MULTIVITAMIN) tablet Take 1 tablet by mouth daily.   07/05/2014 at Unknown time  . OVER THE COUNTER MEDICATION Pre menopause tablets taking bid   Past Week at Unknown time  . oxyCODONE-acetaminophen (PERCOCET) 5-325 MG per tablet Take 1 tablet by mouth every 8 (eight) hours as needed for pain.    07/05/2014 at Unknown time  . Probiotic Product (PROBIOTIC DAILY) CAPS Take 1 capsule by mouth 2 (two) times a week.   07/04/2014 at Unknown time  . ranitidine (ZANTAC) 150 MG tablet Take 150 mg by mouth as needed for heartburn.    Past Week at Unknown time  . zolpidem (AMBIEN) 10 MG tablet Take 10 mg by mouth at bedtime as needed for sleep.    Past Month at Unknown time  . dicyclomine (BENTYL) 10 MG capsule Take 10 mg by mouth 3 (three) times daily as needed (for stomach).    Unk  . furosemide (LASIX) 20 MG tablet Take 1 tablet (20 mg total) by mouth daily as needed (leg swelling). 30 tablet 3 3 months ago  . NITROSTAT 0.4 MG SL tablet Place 0.4 mg under the tongue every 5 (five) minutes as needed for chest pain. For chest pain.   Unk   Scheduled:  . aspirin  81 mg Oral Daily  . diazepam  5 mg Oral Daily  . docusate sodium  100 mg Oral Daily  . escitalopram  5 mg Oral Daily  . fenofibrate  160 mg Oral Daily  . fluticasone  1 spray Each Nare Daily  . meloxicam  15 mg Oral Daily  . montelukast  10 mg Oral Daily  . multivitamin with minerals  1 tablet Oral Daily  . pantoprazole  40 mg Oral Daily    Assessment: 59yo female c/o intermittent CP associated w/ SOB and nausea, in ED HR found to be in 30s, initial troponin negative, to begin heparin for r/o ACS.  Goal of Therapy:  Heparin level 0.3-0.7 units/ml Monitor platelets by anticoagulation protocol:  Yes   Plan:  Will give heparin 3000 units IV bolus x1 followed by gtt at 900 units/hr and monitor heparin levels and CBC.  Wynona Neat, PharmD, BCPS  07/05/2014,10:30 PM

## 2014-07-06 DIAGNOSIS — R079 Chest pain, unspecified: Secondary | ICD-10-CM

## 2014-07-06 LAB — BASIC METABOLIC PANEL
ANION GAP: 9 (ref 5–15)
BUN: <5 mg/dL — ABNORMAL LOW (ref 6–23)
CALCIUM: 9.1 mg/dL (ref 8.4–10.5)
CO2: 27 mmol/L (ref 19–32)
Chloride: 104 mmol/L (ref 96–112)
Creatinine, Ser: 0.61 mg/dL (ref 0.50–1.10)
GFR calc Af Amer: >90 mL/min (ref >90)
Glucose, Bld: 103 mg/dL — ABNORMAL HIGH (ref 70–99)
Potassium: 4.1 mmol/L (ref 3.5–5.1)
Sodium: 140 mmol/L (ref 135–145)

## 2014-07-06 LAB — CBC
HCT: 37.8 % (ref 36.0–46.0)
HEMOGLOBIN: 12.4 g/dL (ref 12.0–15.0)
MCH: 27.2 pg (ref 26.0–34.0)
MCHC: 32.8 g/dL (ref 30.0–36.0)
MCV: 82.9 fL (ref 78.0–100.0)
PLATELETS: 314 10*3/uL (ref 150–400)
RBC: 4.56 MIL/uL (ref 3.87–5.11)
RDW: 13.1 % (ref 11.5–15.5)
WBC: 6.2 10*3/uL (ref 4.0–10.5)

## 2014-07-06 LAB — HEPARIN LEVEL (UNFRACTIONATED)
Heparin Unfractionated: 0.52 IU/mL (ref 0.30–0.70)
Heparin Unfractionated: 0.32 IU/mL (ref 0.30–0.70)

## 2014-07-06 LAB — LIPID PANEL
CHOL/HDL RATIO: 4.7 ratio
Cholesterol: 196 mg/dL (ref 0–200)
HDL: 42 mg/dL (ref >39)
LDL Cholesterol: 139 mg/dL — ABNORMAL HIGH (ref 0–99)
Triglycerides: 75 mg/dL (ref <150)
VLDL: 15 mg/dL (ref 0–40)

## 2014-07-06 LAB — PROTIME-INR
INR: 1.06 (ref 0.00–1.49)
Prothrombin Time: 13.9 seconds (ref 11.6–15.2)

## 2014-07-06 LAB — MRSA PCR SCREENING: MRSA by PCR: NEGATIVE

## 2014-07-06 MED ORDER — DIAZEPAM 5 MG PO TABS
5.0000 mg | ORAL_TABLET | Freq: Every day | ORAL | Status: DC
  Administered 2014-07-06: 5 mg via ORAL
  Filled 2014-07-06: qty 1

## 2014-07-06 MED ORDER — ALPRAZOLAM 0.5 MG PO TABS
0.5000 mg | ORAL_TABLET | Freq: Once | ORAL | Status: AC
  Administered 2014-07-06: 0.5 mg via ORAL
  Filled 2014-07-06: qty 1

## 2014-07-06 MED ORDER — DIAZEPAM 5 MG PO TABS: 5.0000 mg | ORAL_TABLET | Freq: Every day | ORAL | Status: DC

## 2014-07-06 NOTE — Progress Notes (Signed)
Echocardiogram 2D Echocardiogram has been performed.  [name] 07/06/2014, 3:25 PM

## 2014-07-06 NOTE — Progress Notes (Signed)
ANTICOAGULATION CONSULT NOTE - Follow Up Consult  Pharmacy Consult for heparin Indication: chest pain/ACS  Labs:  Recent Labs  07/05/14 1416 07/06/14 0607 07/06/14 1906  HGB 12.3 12.4  --   HCT 37.7 37.8  --   PLT 332 314  --   LABPROT  --  13.9  --   INR  --  1.06  --   HEPARINUNFRC  --  0.52 0.32  CREATININE 0.68 0.61  --      Assessment/Plan:  59yo female therapeutic on heparin with initial dosing for CP. Will continue gtt at current rate and confirm stable with additional level.   Second level remains therapeutic but is trending down on heparin 900 units/hr. Will increase slightly to 950 units/hr and recheck a level with morning labs. No bleeding is noted.  Andrey Cota. Diona Foley, PharmD Clinical Pharmacist Pager [phone]  07/06/2014,8:43 PM

## 2014-07-06 NOTE — Progress Notes (Signed)
Ref: JEFFERY,[name], PA-C   Subjective:  Feeling little better. Lack of energy.  Objective:  Vital Signs in the last 24 hours: Temp:  [97.6 F (36.4 C)-98.7 F (37.1 C)] 98.2 F (36.8 C) (01/31 0841) Pulse Rate:  [36-53] 45 (01/31 0841) Cardiac Rhythm:  [-] Sinus bradycardia (01/31 0800) Resp:  [10-22] 11 (01/31 0841) BP: (103-158)/(51-99) 145/67 mmHg (01/31 0841) SpO2:  [96 %-100 %] 98 % (01/31 0841) Weight:  [72.5 kg (159 lb 13.3 oz)-73 kg (160 lb 15 oz)] 72.5 kg (159 lb 13.3 oz) (01/30 2150)  Physical Exam: BP Readings from Last 1 Encounters:  07/06/14 145/67    Wt Readings from Last 1 Encounters:  07/05/14 72.5 kg (159 lb 13.3 oz)    Weight change:   HEENT: [place]/AT, Eyes-Brown, PERL, EOMI, Conjunctiva-Pink, Sclera-Non-icteric Neck: No JVD, No bruit, Trachea midline. Lungs:  Clear, Bilateral. Cardiac:  Regular rhythm, normal S1 and S2, no S3.  Abdomen:  Soft, non-tender. Extremities:  No edema present. No cyanosis. No clubbing. CNS: AxOx3, Cranial nerves grossly intact, moves all 4 extremities. Right handed. Skin: Warm and dry.   Intake/Output from previous day: 01/30 0701 - 01/31 0700 In: 60.8 [I.V.:60.8] Out: 850 [Urine:850]    Lab Results: BMET    Component Value Date/Time   NA 140 07/06/2014 0607   NA 142 07/05/2014 1416   NA 142 03/26/2014 1217   K 4.1 07/06/2014 0607   K 4.0 07/05/2014 1416   K 4.4 03/26/2014 1217   CL 104 07/06/2014 0607   CL 106 07/05/2014 1416   CL 107 03/26/2014 1217   CO2 27 07/06/2014 0607   CO2 30 07/05/2014 1416   CO2 27 03/26/2014 1217   GLUCOSE 103* 07/06/2014 0607   GLUCOSE 123* 07/05/2014 1416   GLUCOSE 94 03/26/2014 1217   BUN <5* 07/06/2014 0607   BUN 7 07/05/2014 1416   BUN 11 03/26/2014 1217   CREATININE 0.61 07/06/2014 0607   CREATININE 0.68 07/05/2014 1416   CREATININE 0.65 03/26/2014 1217   CREATININE 0.65 10/22/2013 1059   CREATININE 0.71 05/21/2013 0844   CREATININE 0.68 12/28/2012 0525   CALCIUM 9.1  07/06/2014 0607   CALCIUM 9.6 07/05/2014 1416   CALCIUM 9.3 03/26/2014 1217   GFRNONAA >90 07/06/2014 0607   GFRNONAA >90 07/05/2014 1416   GFRNONAA >89 10/22/2013 1059   GFRNONAA >89 05/21/2013 0844   GFRNONAA >90 12/28/2012 0525   GFRAA >90 07/06/2014 0607   GFRAA >90 07/05/2014 1416   GFRAA >89 10/22/2013 1059   GFRAA >89 05/21/2013 0844   GFRAA >90 12/28/2012 0525   CBC    Component Value Date/Time   WBC 6.2 07/06/2014 0607   WBC 6.8 09/25/2012 1120   RBC 4.56 07/06/2014 0607   RBC 4.67 09/25/2012 1120   HGB 12.4 07/06/2014 0607   HGB 12.3 09/25/2012 1120   HCT 37.8 07/06/2014 0607   HCT 39.9 09/25/2012 1120   PLT 314 07/06/2014 0607   MCV 82.9 07/06/2014 0607   MCV 85.5 09/25/2012 1120   MCH 27.2 07/06/2014 0607   MCH 26.3* 09/25/2012 1120   MCHC 32.8 07/06/2014 0607   MCHC 30.8* 09/25/2012 1120   RDW 13.1 07/06/2014 0607   LYMPHSABS 3.0 11/07/2011 1604   MONOABS 0.6 11/07/2011 1604   EOSABS 0.1 11/07/2011 1604   BASOSABS 0.0 11/07/2011 1604   HEPATIC Function Panel  Recent Labs  10/22/13 1059 03/26/14 1217  PROT 7.3 7.0   HEMOGLOBIN A1C No components found for: HGA1C,  MPG CARDIAC ENZYMES  Lab Results  Component Value Date   CKTOTAL 139 11/20/2007   TROPONINI <0.30 11/07/2011   BNP No results for input(s): PROBNP in the last 8760 hours. TSH  Recent Labs  03/26/14 1215  TSH 0.832   CHOLESTEROL  Recent Labs  10/22/13 1059 03/26/14 1215 07/06/14 0607  CHOL 189 167 196    Scheduled Meds: . aspirin  81 mg Oral Daily  . diazepam  5 mg Oral Daily  . docusate sodium  100 mg Oral Daily  . escitalopram  5 mg Oral Daily  . fenofibrate  160 mg Oral Daily  . fluticasone  1 spray Each Nare Daily  . meloxicam  15 mg Oral Daily  . montelukast  10 mg Oral Daily  . multivitamin with minerals  1 tablet Oral Daily  . pantoprazole  40 mg Oral Daily   Continuous Infusions: . heparin 900 Units/hr (07/06/14 0600)   PRN Meds:.albuterol,  nitroGLYCERIN, ondansetron (ZOFRAN) IV, oxyCODONE-acetaminophen  Assessment/Plan: Chest pain Sinus bradycardia Weakness and dizziness from above Anxiety Fibromyalgia  Continue medical treatment. EP consult.   LOS: 1 day    Dixie Dials  MD  07/06/2014, 11:42 AM

## 2014-07-06 NOTE — Progress Notes (Signed)
ANTICOAGULATION CONSULT NOTE - Follow Up Consult  Pharmacy Consult for heparin Indication: chest pain/ACS  Labs:  Recent Labs  07/05/14 1416 07/06/14 0607  HGB 12.3 12.4  HCT 37.7 37.8  PLT 332 314  LABPROT  --  13.9  INR  --  1.06  HEPARINUNFRC  --  0.52  CREATININE 0.68 0.61     Assessment/Plan:  59yo female therapeutic on heparin with initial dosing for CP. Will continue gtt at current rate and confirm stable with additional level.   Wynona Neat, PharmD, BCPS  07/06/2014,7:15 AM

## 2014-07-07 ENCOUNTER — Encounter (HOSPITAL_COMMUNITY): Payer: Medicare Other

## 2014-07-07 ENCOUNTER — Telehealth: Payer: Self-pay

## 2014-07-07 ENCOUNTER — Inpatient Hospital Stay (HOSPITAL_COMMUNITY): Payer: Medicare Other

## 2014-07-07 ENCOUNTER — Encounter (HOSPITAL_COMMUNITY): Payer: Self-pay | Admitting: *Deleted

## 2014-07-07 DIAGNOSIS — I495 Sick sinus syndrome: Principal | ICD-10-CM

## 2014-07-07 DIAGNOSIS — Z950 Presence of cardiac pacemaker: Secondary | ICD-10-CM

## 2014-07-07 HISTORY — DX: Presence of cardiac pacemaker: Z95.0

## 2014-07-07 LAB — CBC
HCT: 38.3 % (ref 36.0–46.0)
Hemoglobin: 12.5 g/dL (ref 12.0–15.0)
MCH: 26.9 pg (ref 26.0–34.0)
MCHC: 32.6 g/dL (ref 30.0–36.0)
MCV: 82.5 fL (ref 78.0–100.0)
Platelets: 307 10*3/uL (ref 150–400)
RBC: 4.64 MIL/uL (ref 3.87–5.11)
RDW: 13 % (ref 11.5–15.5)
WBC: 5.7 10*3/uL (ref 4.0–10.5)

## 2014-07-07 LAB — HEPARIN LEVEL (UNFRACTIONATED): Heparin Unfractionated: 0.47 IU/mL (ref 0.30–0.70)

## 2014-07-07 MED ORDER — REGADENOSON 0.4 MG/5ML IV SOLN
INTRAVENOUS | Status: AC
  Administered 2014-07-07: 0.4 mg via INTRAVENOUS
  Filled 2014-07-07: qty 5

## 2014-07-07 MED ORDER — CHLORHEXIDINE GLUCONATE 4 % EX LIQD
60.0000 mL | Freq: Once | CUTANEOUS | Status: AC
  Administered 2014-07-08: 4 via TOPICAL
  Filled 2014-07-07: qty 60

## 2014-07-07 MED ORDER — TECHNETIUM TC 99M SESTAMIBI GENERIC - CARDIOLITE
30.0000 | Freq: Once | INTRAVENOUS | Status: AC | PRN
  Administered 2014-07-07: 30 via INTRAVENOUS

## 2014-07-07 MED ORDER — TECHNETIUM TC 99M SESTAMIBI GENERIC - CARDIOLITE
10.0000 | Freq: Once | INTRAVENOUS | Status: AC | PRN
  Administered 2014-07-07: 10 via INTRAVENOUS

## 2014-07-07 MED ORDER — SODIUM CHLORIDE 0.9 % IR SOLN
80.0000 mg | Status: DC
  Filled 2014-07-07 (×2): qty 2

## 2014-07-07 MED ORDER — CEFAZOLIN SODIUM-DEXTROSE 2-3 GM-% IV SOLR
2.0000 g | INTRAVENOUS | Status: DC
  Filled 2014-07-07 (×2): qty 50

## 2014-07-07 MED ORDER — BISACODYL 5 MG PO TBEC
10.0000 mg | DELAYED_RELEASE_TABLET | Freq: Once | ORAL | Status: AC
  Administered 2014-07-07: 10 mg via ORAL
  Filled 2014-07-07 (×2): qty 2

## 2014-07-07 MED ORDER — SODIUM CHLORIDE 0.9 % IV SOLN
INTRAVENOUS | Status: DC
  Administered 2014-07-08: 50 mL/h via INTRAVENOUS

## 2014-07-07 MED ORDER — ALPRAZOLAM 0.5 MG PO TABS
1.0000 mg | ORAL_TABLET | Freq: Every day | ORAL | Status: DC
  Administered 2014-07-08: 1 mg via ORAL
  Filled 2014-07-07: qty 2

## 2014-07-07 MED ORDER — REGADENOSON 0.4 MG/5ML IV SOLN
0.4000 mg | Freq: Once | INTRAVENOUS | Status: AC
  Administered 2014-07-07: 0.4 mg via INTRAVENOUS
  Filled 2014-07-07: qty 5

## 2014-07-07 NOTE — Telephone Encounter (Signed)
I called pt in her hospital room. No answer at this time but will try again later.

## 2014-07-07 NOTE — Telephone Encounter (Signed)
For [name] Pt states her heart was bothering her the past weekend and she went over to the emergency room and they have kept her so she is still in the hospital Her room number is [phone]

## 2014-07-07 NOTE — Progress Notes (Signed)
Ref: JEFFERY,[name], PA-C   Subjective:  Feeling better. Heart rate in 40-50 and even 60's with activity. Appreciate EP consult. No reversible ischemia on nuclear stress test with 70 + % EF.  Objective:  Vital Signs in the last 24 hours: Temp:  [97.6 F (36.4 C)-98.5 F (36.9 C)] 98.3 F (36.8 C) (02/01 1620) Pulse Rate:  [42-57] 57 (02/01 1620) Cardiac Rhythm:  [-] Sinus bradycardia (02/01 1000) Resp:  [13-18] 13 (02/01 0752) BP: (122-197)/(41-97) 127/81 mmHg (02/01 1620) SpO2:  [96 %-98 %] 98 % (02/01 1620) Weight:  [73.1 kg (161 lb 2.5 oz)] 73.1 kg (161 lb 2.5 oz) (02/01 0511)  Physical Exam: BP Readings from Last 1 Encounters:  07/07/14 127/81    Wt Readings from Last 1 Encounters:  07/07/14 73.1 kg (161 lb 2.5 oz)    Weight change: 0.1 kg (3.5 oz)  HEENT: [place]/AT, Eyes-Brown, PERL, EOMI, Conjunctiva-Pink, Sclera-Non-icteric Neck: No JVD, No bruit, Trachea midline. Lungs:  Clear, Bilateral. Cardiac:  Regular rhythm, normal S1 and S2, no S3.  Abdomen:  Soft, non-tender. Extremities:  No edema present. No cyanosis. No clubbing. CNS: AxOx3, Cranial nerves grossly intact, moves all 4 extremities. Right handed. Skin: Warm and dry.   Intake/Output from previous day: 01/31 0701 - 02/01 0700 In: 595.6 [P.O.:375; I.V.:220.6] Out: 1775 [Urine:1775]    Lab Results: BMET    Component Value Date/Time   NA 140 07/06/2014 0607   NA 142 07/05/2014 1416   NA 142 03/26/2014 1217   K 4.1 07/06/2014 0607   K 4.0 07/05/2014 1416   K 4.4 03/26/2014 1217   CL 104 07/06/2014 0607   CL 106 07/05/2014 1416   CL 107 03/26/2014 1217   CO2 27 07/06/2014 0607   CO2 30 07/05/2014 1416   CO2 27 03/26/2014 1217   GLUCOSE 103* 07/06/2014 0607   GLUCOSE 123* 07/05/2014 1416   GLUCOSE 94 03/26/2014 1217   BUN <5* 07/06/2014 0607   BUN 7 07/05/2014 1416   BUN 11 03/26/2014 1217   CREATININE 0.61 07/06/2014 0607   CREATININE 0.68 07/05/2014 1416   CREATININE 0.65 03/26/2014 1217   CREATININE 0.65 10/22/2013 1059   CREATININE 0.71 05/21/2013 0844   CREATININE 0.68 12/28/2012 0525   CALCIUM 9.1 07/06/2014 0607   CALCIUM 9.6 07/05/2014 1416   CALCIUM 9.3 03/26/2014 1217   GFRNONAA >90 07/06/2014 0607   GFRNONAA >90 07/05/2014 1416   GFRNONAA >89 10/22/2013 1059   GFRNONAA >89 05/21/2013 0844   GFRNONAA >90 12/28/2012 0525   GFRAA >90 07/06/2014 0607   GFRAA >90 07/05/2014 1416   GFRAA >89 10/22/2013 1059   GFRAA >89 05/21/2013 0844   GFRAA >90 12/28/2012 0525   CBC    Component Value Date/Time   WBC 5.7 07/07/2014 0443   WBC 6.8 09/25/2012 1120   RBC 4.64 07/07/2014 0443   RBC 4.67 09/25/2012 1120   HGB 12.5 07/07/2014 0443   HGB 12.3 09/25/2012 1120   HCT 38.3 07/07/2014 0443   HCT 39.9 09/25/2012 1120   PLT 307 07/07/2014 0443   MCV 82.5 07/07/2014 0443   MCV 85.5 09/25/2012 1120   MCH 26.9 07/07/2014 0443   MCH 26.3* 09/25/2012 1120   MCHC 32.6 07/07/2014 0443   MCHC 30.8* 09/25/2012 1120   RDW 13.0 07/07/2014 0443   LYMPHSABS 3.0 11/07/2011 1604   MONOABS 0.6 11/07/2011 1604   EOSABS 0.1 11/07/2011 1604   BASOSABS 0.0 11/07/2011 1604   HEPATIC Function Panel  Recent Labs  10/22/13 1059 03/26/14  1217  PROT 7.3 7.0   HEMOGLOBIN A1C No components found for: HGA1C,  MPG CARDIAC ENZYMES Lab Results  Component Value Date   CKTOTAL 139 11/20/2007   TROPONINI <0.30 11/07/2011   BNP No results for input(s): PROBNP in the last 8760 hours. TSH  Recent Labs  03/26/14 1215  TSH 0.832   CHOLESTEROL  Recent Labs  10/22/13 1059 03/26/14 1215 07/06/14 0607  CHOL 189 167 196    Scheduled Meds: . ALPRAZolam  1 mg Oral QHS  . aspirin  81 mg Oral Daily  .  ceFAZolin (ANCEF) IV  2 g Intravenous On Call  . chlorhexidine  60 mL Topical Once  . chlorhexidine  60 mL Topical Once  . docusate sodium  100 mg Oral Daily  . escitalopram  5 mg Oral Daily  . fenofibrate  160 mg Oral Daily  . fluticasone  1 spray Each Nare Daily  .  gentamicin irrigation  80 mg Irrigation On Call  . meloxicam  15 mg Oral Daily  . montelukast  10 mg Oral Daily  . multivitamin with minerals  1 tablet Oral Daily  . pantoprazole  40 mg Oral Daily   Continuous Infusions: . [START ON 07/08/2014] sodium chloride     PRN Meds:.albuterol, nitroGLYCERIN, ondansetron (ZOFRAN) IV, oxyCODONE-acetaminophen  Assessment/Plan: Chest pain Sinus bradycardia-Sick sinus syndrome Weakness and dizziness from above Anxiety Fibromyalgia  Awaiting PPM in AM. Xanax instead of Valium for HS use per patient request.     LOS: 2 days    Dixie Dials  MD  07/07/2014, 6:09 PM

## 2014-07-07 NOTE — Progress Notes (Signed)
Nutrition Brief Note  Patient identified on the Malnutrition Screening Tool (MST) Report  Wt Readings from Last 15 Encounters:  07/07/14 161 lb 2.5 oz (73.1 kg)  03/26/14 161 lb (73.029 kg)  03/10/14 160 lb 6.4 oz (72.757 kg)  02/19/14 161 lb 3.2 oz (73.12 kg)  10/22/13 160 lb 9.6 oz (72.848 kg)  08/15/13 159 lb (72.122 kg)  07/18/13 157 lb (71.215 kg)  07/05/13 154 lb (69.854 kg)  06/18/13 158 lb 6.4 oz (71.85 kg)  05/21/13 154 lb (69.854 kg)  04/16/13 155 lb (70.308 kg)  01/29/13 154 lb (69.854 kg)  12/28/12 158 lb (71.668 kg)  12/17/12 152 lb 8 oz (69.174 kg)  12/03/12 154 lb (69.854 kg)    Body mass index is 25.23 kg/(m^2). Patient meets criteria for Overweight based on current BMI.   Current diet order is NPO. Yesterday pt was on a Heart healthy diet and reports consuming approximately 100% of meals at this time. Pt reports having a good appetite and eating well PTA. She reports a usual body weight of 161 lbs. Labs and medications reviewed.   No nutrition interventions warranted at this time. If nutrition issues arise, please consult RD.   Pryor Ochoa RD, LDN Inpatient Clinical Dietitian Pager: [phone] After Hours Pager: [phone]

## 2014-07-07 NOTE — Progress Notes (Signed)
[facility] for heparin Indication: chest pain/ACS  Allergies  Allergen Reactions  . Codeine Other (See Comments)    HALLUCINATIONS   . Propoxyphene N-Acetaminophen Other (See Comments)    HALLUCINATIONS  . Sulfonamide Derivatives Hives, Itching and Swelling  . Alka-Seltzer [Aspirin Effervescent] Other (See Comments)    "like she's fading away"  . Norvasc [Amlodipine Besylate]   . Iohexol Nausea Only    JITTERY    . Latex Hives and Itching    Patient Measurements: Height: 5' 7"  (170.2 cm) Weight: 161 lb 2.5 oz (73.1 kg) IBW/kg (Calculated) : 61.6  Vital Signs: Temp: 98.5 F (36.9 C) (02/01 0752) Temp Source: Oral (02/01 0752) BP: 131/63 mmHg (02/01 0752) Pulse Rate: 42 (02/01 0752)  Labs:  Recent Labs  07/05/14 1416 07/06/14 0607 07/06/14 1906 07/07/14 0443  HGB 12.3 12.4  --  12.5  HCT 37.7 37.8  --  38.3  PLT 332 314  --  307  LABPROT  --  13.9  --   --   INR  --  1.06  --   --   HEPARINUNFRC  --  0.52 0.32 0.47  CREATININE 0.68 0.61  --   --     Estimated Creatinine Clearance: 73.6 mL/min (by C-G formula based on Cr of 0.61).   Assessment: 59yo female c/o intermittent CP associated w/ SOB and nausea, in ED HR found to be in 30s, initial troponin negative, started on heparin for r/o ACS. At nuclear medicine now for stress test.  HL therapeutic at 0.47 on 950 units/hr.  CBC WNL.  No bleeding reported.   Goal of Therapy:  Heparin level 0.3-0.7 units/ml Monitor platelets by anticoagulation protocol: Yes   Plan:  -continue heparin drip at 950 units/hr -daily HL and CBC  Eudelia Bunch, Pharm.D. [phone] 07/07/2014 11:37 AM

## 2014-07-07 NOTE — Telephone Encounter (Signed)
Please call this patient (in her hospital room) and let her know that I'm thinking about her.
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
## 2014-07-07 NOTE — Consult Note (Addendum)
ELECTROPHYSIOLOGY CONSULT NOTE    Patient ID: [name] MRN: [id number], DOB/AGE: 07/18/1955 59 y.o.  Admit date: 07/05/2014 Date of Consult: 07/07/2014  Primary Physician: Wynne Dust Primary Cardiologist: Doylene Canard  Reason for Consultation: bradycardia  HPI:  [name] is a 59 y.o. female with a past medical history significant for GERD, fibromyalgia, hypertension, depression, and asthma.  She has known sinus bradycardia and has been off AV nodal blocking agents for several years.  She presented to the ER on 07/05/14 with atypical chest pain and was admitted for further evaluation.  Nuclear study done today with images pending.  HR went from 47 to 86 with walking (58% of max predicted).    She was admitted 12/2012 with dizziness while driving.  When EMS arrived, her HR was noted to be in the 40's and her AV nodal blocking agents were discontinued at that time.   Echo 07-06-14 demonstrated EF 60-65%, no RWMA, LA mildly dilated.   She reports progressive exercise intolerance over the last 4 weeks associated with pre-syncope.  She denies frank syncope.  She has had chest pain that is atypical in nature and not associated with exertion or relieved with rest. She denies recent fevers, chills, nausea or vomiting.  ROS is otherwise negative.   EP has been asked to evaluate for treatment options.  ROS is negative except as outlined above.   Past Medical History  Diagnosis Date  . GERD (gastroesophageal reflux disease)   . Arthritis   . Anxiety   . Fibromyalgia   . Bursitis     right hip  . Anal fissure 02/2012  . Hypertension     under control, has been on med. since 2010  . Tarsal tunnel syndrome of left side   . Complication of anesthesia     difficult to wake up post-op  . Depression   . Asthma     daily and prn inhalers  . Irritable bowel syndrome (IBS)   . TMJ (temporomandibular joint syndrome)     left  . Hyperlipidemia   . Pneumonia ~ 06/2012  . Chronic  bronchitis     "get it about q year" (12/27/2012)  . Tarsal tunnel syndrome of left side   . Hepatitis A infection 1979     Surgical History:  Past Surgical History  Procedure Laterality Date  . Shoulder arthroscopy w/ rotator cuff repair Left 1999  . Bilateral salpingoophorectomy Bilateral 04/2011  . Cystoscopy w/ ureteral stent placement  08/04/2009  . Laparoscopic lysis intestinal adhesions  08/04/2009  . Cardiac catheterization  10/08/2007  . Examination under anesthesia  02/17/2012    Procedure: EXAM UNDER ANESTHESIA;  Surgeon: Pedro Earls, MD;  Location: [facility];  Service: General;  Laterality: N/A;  Exam under anesthesia with lateral internal sphincterotomy  . Sphincterotomy  02/17/2012    Procedure: SPHINCTEROTOMY;  Surgeon: Pedro Earls, MD;  Location: [facility];  Service: General;  Laterality: N/A;  . Abdominal hysterectomy  09/1994    partial  . Appendectomy    . Cesarean section    . Small intestine surgery       Prescriptions prior to admission  Medication Sig Dispense Refill Last Dose  . albuterol (PROVENTIL HFA;VENTOLIN HFA) 108 (90 BASE) MCG/ACT inhaler Inhale 2 puffs into the lungs every 6 (six) hours as needed for wheezing. 18 g 2 07/05/2014 at Unknown time  . aspirin 81 MG chewable tablet Chew 81 mg by mouth daily.  07/04/2014 at Unknown time  . beclomethasone (QVAR) 40 MCG/ACT inhaler Inhale 1-2 puffs into the lungs 2 (two) times daily. 1 Inhaler 12 07/05/2014 at Unknown time  . calcipotriene-betamethasone (TACLONEX) ointment Apply 1 application topically daily.   07/05/2014 at Unknown time  . dexlansoprazole (DEXILANT) 60 MG capsule Take 60 mg by mouth daily as needed (for acid reflux).   Past Month at Unknown time  . diazepam (VALIUM) 5 MG tablet Take 1 tablet (5 mg total) by mouth daily. 30 tablet 0 Past Month at Unknown time  . docusate sodium (COLACE) 100 MG capsule Take 100 mg by mouth daily.    07/05/2014 at Unknown time  .  escitalopram (LEXAPRO) 5 MG tablet Take 5 mg by mouth daily.   07/05/2014 at Unknown time  . fenofibrate 160 MG tablet Take 1 tablet (160 mg total) by mouth daily. 90 tablet 3 07/04/2014 at Unknown time  . fluticasone (FLONASE) 50 MCG/ACT nasal spray USE 2 SPRAYS INTO EACH NOSTRIL EVERY DAY 16 g 2 Past Week at Unknown time  . hydrOXYzine (ATARAX/VISTARIL) 25 MG tablet Take 0.5-2 tablets (12.5-50 mg total) by mouth at bedtime as needed for anxiety (insomnia). 60 tablet 2 Past Month at Unknown time  . ipratropium (ATROVENT) 0.03 % nasal spray USE 2 SPRAYS INTO THE NOSE EVERY 12 (TWELVE) HOURS. 30 mL 9 Past Month at Unknown time  . ketorolac (ACULAR) 0.4 % SOLN Place 1 drop into both eyes 4 (four) times daily. 5 mL 0 Past Month at Unknown time  . Linaclotide (LINZESS) 145 MCG CAPS Take 145 mcg by mouth daily.   Past Month at Unknown time  . meloxicam (MOBIC) 15 MG tablet Take 1 tablet (15 mg total) by mouth daily. 30 tablet 1 07/05/2014 at Unknown time  . metaxalone (SKELAXIN) 800 MG tablet Take 400-800 mg by mouth daily as needed for pain (for muscle spasms).    Past Week at Unknown time  . montelukast (SINGULAIR) 10 MG tablet Take 1 tablet (10 mg total) by mouth daily. 90 tablet 3 07/04/2014 at Unknown time  . Multiple Vitamin (MULTIVITAMIN) tablet Take 1 tablet by mouth daily.   07/05/2014 at Unknown time  . OVER THE COUNTER MEDICATION Pre menopause tablets taking bid   Past Week at Unknown time  . oxyCODONE-acetaminophen (PERCOCET) 5-325 MG per tablet Take 1 tablet by mouth every 8 (eight) hours as needed for pain.    07/05/2014 at Unknown time  . Probiotic Product (PROBIOTIC DAILY) CAPS Take 1 capsule by mouth 2 (two) times a week.   07/04/2014 at Unknown time  . ranitidine (ZANTAC) 150 MG tablet Take 150 mg by mouth as needed for heartburn.    Past Week at Unknown time  . zolpidem (AMBIEN) 10 MG tablet Take 10 mg by mouth at bedtime as needed for sleep.    Past Month at Unknown time  . dicyclomine  (BENTYL) 10 MG capsule Take 10 mg by mouth 3 (three) times daily as needed (for stomach).    Unk  . furosemide (LASIX) 20 MG tablet Take 1 tablet (20 mg total) by mouth daily as needed (leg swelling). 30 tablet 3 3 months ago  . NITROSTAT 0.4 MG SL tablet Place 0.4 mg under the tongue every 5 (five) minutes as needed for chest pain. For chest pain.   Unk    Inpatient Medications:  . aspirin  81 mg Oral Daily  . diazepam  5 mg Oral QHS  . docusate sodium  100 mg Oral  Daily  . escitalopram  5 mg Oral Daily  . fenofibrate  160 mg Oral Daily  . fluticasone  1 spray Each Nare Daily  . meloxicam  15 mg Oral Daily  . montelukast  10 mg Oral Daily  . multivitamin with minerals  1 tablet Oral Daily  . pantoprazole  40 mg Oral Daily    Allergies:  Allergies  Allergen Reactions  . Codeine Other (See Comments)    HALLUCINATIONS   . Propoxyphene N-Acetaminophen Other (See Comments)    HALLUCINATIONS  . Sulfonamide Derivatives Hives, Itching and Swelling  . Alka-Seltzer [Aspirin Effervescent] Other (See Comments)    "like she's fading away"  . Norvasc [Amlodipine Besylate]   . Iohexol Nausea Only    JITTERY    . Latex Hives and Itching    History   Social History  . Marital Status: Divorced    Spouse Name: n/a    Number of Children: 1  . Years of Education: 17   Occupational History  . Disabled     Fibromyalgia, Depression, Anxiety   Social History Main Topics  . Smoking status: Former Smoker -- 0.00 packs/day for 37 years    Types: Cigarettes    Quit date: 06/05/2012  . Smokeless tobacco: Never Used  . Alcohol Use: No  . Drug Use: No  . Sexual Activity: Not Currently    Birth Control/ Protection: Post-menopausal   Other Topics Concern  . Not on file   Social History Narrative   Lives alone.  Daughter and granddaughter live in [place], [place]. Pt exercise sometimes.           Family History  Problem Relation Age of Onset  . Cancer Mother     liver  . Diabetes  Mother   . Hyperlipidemia Mother   . Hypertension Mother   . Cancer Maternal Grandmother     breast  . Anesthesia problems Sister     hard to wake up post-op  . Heart disease Father   . Diabetes Brother   . Hyperlipidemia Brother   . Cancer Maternal Grandfather     colon    BP 197/97 mmHg  Pulse 42  Temp(Src) 98.5 F (36.9 C) (Oral)  Resp 13  Ht 5' 7"  (1.702 m)  Wt 161 lb 2.5 oz (73.1 kg)  BMI 25.23 kg/m2  SpO2 96%  Physical Exam: Well appearing 59 yo woman, NAD HEENT: Unremarkable,[place], AT Neck:  6 JVD, no thyromegally Back:  No CVA tenderness Lungs:  Clear with no wheezes, rales, or rhonchi HEART:  Regular bradycardia, no murmurs, no rubs, no clicks Abd:  soft, positive bowel sounds, no organomegally, no rebound, no guarding Ext:  2 plus pulses, no edema, no cyanosis, no clubbing Skin:  No rashes no nodules Neuro:  CN II through XII intact, motor grossly intact   Labs:   Lab Results  Component Value Date   WBC 5.7 07/07/2014   HGB 12.5 07/07/2014   HCT 38.3 07/07/2014   MCV 82.5 07/07/2014   PLT 307 07/07/2014     Recent Labs Lab 07/06/14 0607  NA 140  K 4.1  CL 104  CO2 27  BUN <5*  CREATININE 0.61  CALCIUM 9.1  GLUCOSE 103*    Radiology/Studies: Dg Chest 2 View 07/05/2014   CLINICAL DATA:  Chest pain, shortness of breath and nausea for 3 days.  EXAM: CHEST  2 VIEW  COMPARISON:  Single view of the chest 03/10/2014 and 12/27/2012. CT chest 10/05/2012.  FINDINGS:  Heart size is upper normal. The lungs are clear. There is no pneumothorax or pleural effusion. No focal bony abnormality is identified.  IMPRESSION: No acute disease.   Electronically Signed   By: Inge Rise M.D.   On: 07/05/2014 15:53    PSZ:JUDIL brady, rate 42, normal intervals, old anteriolateral ST-T changes  TELEMETRY: sinus brady with rates 30-50's  A/P 1. Symptomatic sinus node dysfunction 2. HTN 3. Chest pain with h/o normal coronaries, s/p stress test, results  pending  Rec: I have discussed the indications for PPM. The risks/benefits/goals/expectations of the procedure have been discussed with the patient and she wishes to proceed. Will check thyroid panel. As she has eaten today and has been on heparin, she will undergo PPM insertion tomorrow.  Mikle Bosworth.D.

## 2014-07-07 NOTE — Progress Notes (Signed)
Pt is going to nuclear medicine now for a stress test. Pt is alert and oriented x 4. Pt refused to take her medication this am with a sip of water as she stated she needs to eat first. Will give medications when she returns. Pt also told she was not to eat or drink any food or liquid but pt drank 2 cups of water just before 1000. Called and notified NM nurse. Central telemetry notified

## 2014-07-08 ENCOUNTER — Ambulatory Visit: Payer: 59 | Admitting: Professional

## 2014-07-08 ENCOUNTER — Ambulatory Visit: Payer: Self-pay | Admitting: Physician Assistant

## 2014-07-08 ENCOUNTER — Encounter (HOSPITAL_COMMUNITY)
Admission: EM | Disposition: A | Discharge status: Home / Self Care | Payer: Self-pay | Source: Home / Self Care | Attending: Cardiovascular Disease

## 2014-07-08 ENCOUNTER — Encounter (HOSPITAL_COMMUNITY): Payer: Self-pay | Admitting: *Deleted

## 2014-07-08 DIAGNOSIS — I495 Sick sinus syndrome: Secondary | ICD-10-CM

## 2014-07-08 HISTORY — PX: PERMANENT PACEMAKER INSERTION: SHX5480

## 2014-07-08 LAB — GLUCOSE, CAPILLARY: Glucose-Capillary: 79 mg/dL (ref 70–99)

## 2014-07-08 LAB — CBC
HCT: 39 % (ref 36.0–46.0)
Hemoglobin: 12.8 g/dL (ref 12.0–15.0)
MCH: 27.4 pg (ref 26.0–34.0)
MCHC: 32.8 g/dL (ref 30.0–36.0)
MCV: 83.5 fL (ref 78.0–100.0)
Platelets: 312 10*3/uL (ref 150–400)
RBC: 4.67 MIL/uL (ref 3.87–5.11)
RDW: 12.9 % (ref 11.5–15.5)
WBC: 6.2 10*3/uL (ref 4.0–10.5)

## 2014-07-08 LAB — TSH: TSH: 1.518 u[IU]/mL (ref 0.350–4.500)

## 2014-07-08 SURGERY — PERMANENT PACEMAKER INSERTION

## 2014-07-08 MED ORDER — LIDOCAINE HCL (PF) 1 % IJ SOLN
INTRAMUSCULAR | Status: AC
  Filled 2014-07-08: qty 30

## 2014-07-08 MED ORDER — SODIUM CHLORIDE 0.9 % IJ SOLN: 3.0000 mL | INTRAMUSCULAR | Status: DC | PRN

## 2014-07-08 MED ORDER — FENTANYL CITRATE 0.05 MG/ML IJ SOLN
INTRAMUSCULAR | Status: AC
  Filled 2014-07-08: qty 2

## 2014-07-08 MED ORDER — SODIUM CHLORIDE 0.9 % IV SOLN: 250.0000 mL | INTRAVENOUS | Status: DC | PRN

## 2014-07-08 MED ORDER — MIDAZOLAM HCL 5 MG/5ML IJ SOLN
INTRAMUSCULAR | Status: AC
Start: 2014-07-08 — End: 2014-07-08
  Filled 2014-07-08: qty 5

## 2014-07-08 MED ORDER — YOU HAVE A PACEMAKER BOOK
Freq: Once | Status: AC
  Administered 2014-07-08: 06:00:00
  Filled 2014-07-08: qty 1

## 2014-07-08 MED ORDER — HEPARIN (PORCINE) IN NACL 2-0.9 UNIT/ML-% IJ SOLN
INTRAMUSCULAR | Status: AC
  Filled 2014-07-08: qty 500

## 2014-07-08 MED ORDER — MIDAZOLAM HCL 5 MG/5ML IJ SOLN
INTRAMUSCULAR | Status: AC
  Filled 2014-07-08: qty 5

## 2014-07-08 MED ORDER — ACETAMINOPHEN 325 MG PO TABS: 325.0000 mg | ORAL_TABLET | ORAL | Status: DC | PRN

## 2014-07-08 MED ORDER — CEFAZOLIN SODIUM 1-5 GM-% IV SOLN
1.0000 g | Freq: Four times a day (QID) | INTRAVENOUS | Status: AC
  Administered 2014-07-08 – 2014-07-09 (×3): 1 g via INTRAVENOUS
  Filled 2014-07-08 (×3): qty 50

## 2014-07-08 MED ORDER — LIDOCAINE HCL (PF) 1 % IJ SOLN
INTRAMUSCULAR | Status: AC
Start: 2014-07-08 — End: 2014-07-08
  Filled 2014-07-08: qty 60

## 2014-07-08 MED ORDER — DIAZEPAM 5 MG PO TABS
5.0000 mg | ORAL_TABLET | Freq: Once | ORAL | Status: AC
  Administered 2014-07-08: 5 mg via ORAL
  Filled 2014-07-08: qty 1

## 2014-07-08 MED ORDER — ONDANSETRON HCL 4 MG/2ML IJ SOLN: 4.0000 mg | Freq: Four times a day (QID) | INTRAMUSCULAR | Status: DC | PRN

## 2014-07-08 MED ORDER — SODIUM CHLORIDE 0.9 % IJ SOLN
3.0000 mL | Freq: Two times a day (BID) | INTRAMUSCULAR | Status: DC
  Administered 2014-07-08 – 2014-07-09 (×3): 3 mL via INTRAVENOUS

## 2014-07-08 NOTE — Telephone Encounter (Signed)
Spoke with pt, she states her surgery went well. They placed her pacemaker and she is in recovery. Pt thanks you for checking on her [name].

## 2014-07-08 NOTE — Op Note (Signed)
SURGEON:  [name], MD     PREPROCEDURE DIAGNOSIS:  Sick sinus syndrome    POSTPROCEDURE DIAGNOSIS:  Sick sinus syndrome     PROCEDURES:   1. Pacemaker implantation.     INTRODUCTION: [name] is a 59 y.o. female  with a history of symptomatic sinus bradycardia who presents today for pacemaker implantation.  The patient reports intermittent episodes of dizziness and fatigue over the past few months.  She is documented to have sinus bradycardia with heart rates 40s.  No reversible causes have been identified.  The patient therefore presents today for pacemaker implantation.     DESCRIPTION OF PROCEDURE:  Informed written consent was obtained, and the patient was brought to the electrophysiology lab in a fasting state.  The patient received IV Versed and Fentanyl for sedation for the procedure today as outlined in the nursing report.  The patients left chest was prepped and draped in the usual sterile fashion by the EP lab staff. The skin overlying the left deltopectoral region was infiltrated with lidocaine for local analgesia.  A 4-cm incision was made over the left deltopectoral region.  A left subcutaneous pacemaker pocket was fashioned using a combination of sharp and blunt dissection. Electrocautery was required to assure hemostasis.    RA/RV Lead Placement: The left axillary vein was therefore cannulated.  No contrast was required for the procedure today.  Through the left axillary vein, a Medtronic model [id number] (serial number [id number]) right atrial lead and a Medtronic model 5076- 58 (serial number [id number]) right ventricular lead were advanced   with fluoroscopic visualization into the right atrial appendage and right ventricular apical septum positions respectively.  Initial atrial lead P- waves measured 4.7 mV with impedance of 950 ohms and a threshold of 1.25 V at 0.4 msec.  Right ventricular lead R-waves measured  20 mV with an impedance of 779 ohms and a threshold of 0.9 V at  0.4 msec.  Both leads were secured to the pectoralis fascia using #2-0 silk over the suture sleeves.   Device Placement:  The leads were then connected to a Medtronic Advisa DR MRI Sure Scan model [id number] (serial number [id number]) pacemaker.  The pocket was irrigated with copious gentamicin solution.  The pacemaker was then placed into the pocket.  The pocket was then closed in 2 layers with 2.0 Vicryl suture for the subcutaneous and subcuticular layers.  Steri- Strips and a sterile dressing were then applied. EBL<31m. There were no early apparent complications.  No contrast was required for the procedure today.    CONCLUSIONS:   1. Successful implantation of a Medtronic Adapta L dual-chamber pacemaker for symptomatic sinus bradycardia  2. No early apparent complications.      Permanent Pacemaker Indication: Documented non-reversible symptomatic bradycardia due to sinus node dysfunction.        JThompson Grayer MD 07/08/2014 1:18 PM

## 2014-07-08 NOTE — Progress Notes (Signed)
SUBJECTIVE: The patient is doing well today.  At this time, she denies chest pain, shortness of breath, or any new concerns.  . ALPRAZolam  1 mg Oral QHS  . aspirin  81 mg Oral Daily  .  ceFAZolin (ANCEF) IV  2 g Intravenous On Call  . docusate sodium  100 mg Oral Daily  . escitalopram  5 mg Oral Daily  . fenofibrate  160 mg Oral Daily  . fluticasone  1 spray Each Nare Daily  . gentamicin irrigation  80 mg Irrigation On Call  . meloxicam  15 mg Oral Daily  . montelukast  10 mg Oral Daily  . multivitamin with minerals  1 tablet Oral Daily  . pantoprazole  40 mg Oral Daily   . sodium chloride 50 mL/hr (07/08/14 0919)    OBJECTIVE: Physical Exam: Filed Vitals:   07/08/14 0038 07/08/14 0600 07/08/14 0622 07/08/14 0927  BP: 116/60  138/72 128/68  Pulse: 48  48 48  Temp: 98.8 F (37.1 C)  98.1 F (36.7 C) 98.3 F (36.8 C)  TempSrc: Oral  Oral Oral  Resp: 13  16 12   Height:      Weight:  154 lb 5.2 oz (70 kg)    SpO2: 91%  99% 98%    Intake/Output Summary (Last 24 hours) at 07/08/14 1147 Last data filed at 07/07/14 2200  Gross per 24 hour  Intake  843.5 ml  Output      0 ml  Net  843.5 ml    Telemetry reveals sinus bradycardia, HR 40s  GEN- The patient is overweight appearing, alert and oriented x 3 today.   Head- normocephalic, atraumatic Eyes-  Sclera clear, conjunctiva pink Ears- hearing intact Oropharynx- clear Neck- supple,   Lungs- Clear to ausculation bilaterally, normal work of breathing Heart- bradycardic regular rhythm GI- soft, NT, ND, + BS Extremities- no clubbing, cyanosis, or edema Skin- no rash or lesion Psych- euthymic mood, full affect Neuro- strength and sensation are intact  LABS: Basic Metabolic Panel:  Recent Labs  07/05/14 1416 07/06/14 0607  NA 142 140  K 4.0 4.1  CL 106 104  CO2 30 27  GLUCOSE 123* 103*  BUN 7 <5*  CREATININE 0.68 0.61  CALCIUM 9.6 9.1   Liver Function Tests: No results for input(s): AST, ALT, ALKPHOS,  BILITOT, PROT, ALBUMIN in the last 72 hours. No results for input(s): LIPASE, AMYLASE in the last 72 hours. CBC:  Recent Labs  07/07/14 0443 07/08/14 0349  WBC 5.7 6.2  HGB 12.5 12.8  HCT 38.3 39.0  MCV 82.5 83.5  PLT 307 312   Fasting Lipid Panel:  Recent Labs  07/06/14 0607  CHOL 196  HDL 42  LDLCALC 139*  TRIG 75  CHOLHDL 4.7   Thyroid Function Tests:  Recent Labs  07/08/14 0349  TSH 1.518   Anemia Panel: No results for input(s): VITAMINB12, FOLATE, FERRITIN, TIBC, IRON, RETICCTPCT in the last 72 hours.  RADIOLOGY: Dg Chest 2 View  07/05/2014   CLINICAL DATA:  Chest pain, shortness of breath and nausea for 3 days.  EXAM: CHEST  2 VIEW  COMPARISON:  Single view of the chest 03/10/2014 and 12/27/2012. CT chest 10/05/2012.  FINDINGS: Heart size is upper normal. The lungs are clear. There is no pneumothorax or pleural effusion. No focal bony abnormality is identified.  IMPRESSION: No acute disease.   Electronically Signed   By: Inge Rise M.D.   On: 07/05/2014 15:53   Nm Myocar Multi  W/spect W/wall Motion / Ef  07/07/2014   CLINICAL DATA:  Chest pain at rest.  EXAM: MYOCARDIAL IMAGING WITH SPECT (REST AND PHARMACOLOGIC-STRESS)  GATED LEFT VENTRICULAR WALL MOTION STUDY  LEFT VENTRICULAR EJECTION FRACTION  TECHNIQUE: Standard myocardial SPECT imaging was performed after resting intravenous injection of 10 mCi [id number]. Subsequently, intravenous infusion of Lexiscan was performed under the supervision of the Cardiology staff. At peak effect of the drug, 30 mCi [id number] was injected intravenously and standard myocardial SPECT imaging was performed. Quantitative gated imaging was also performed to evaluate left ventricular wall motion, and estimate left ventricular ejection fraction.  COMPARISON:  None.  FINDINGS: Perfusion: No decreased activity in the left ventricle on stress imaging to suggest reversible ischemia or infarction.  Wall Motion: Normal left  ventricular wall motion. No left ventricular dilation.  Left Ventricular Ejection Fraction: 76 %  End diastolic volume 45 ml  End systolic volume 14 ml  IMPRESSION: 1. No reversible ischemia or infarction.  2. Normal left ventricular wall motion.  3. Left ventricular ejection fraction 76%  4. Low-risk stress test findings*.  *2012 Appropriate Use Criteria for Coronary Revascularization Focused Update: J Am Coll Cardiol. 209924;26(8):[email or web address]   Electronically Signed   By: KeRolm Baptise.D.   On: 07/07/2014 18:01    ASSESSMENT AND PLAN:  Active Problems:   Chest pain at rest  1. Sick sinus syndrome The patient has symptomatic sinus bradycardia.  No reversible causes have been found.  I would therefore recommend pacemaker implantation at this time.  Risks, benefits, alternatives to pacemaker implantation were discussed in detail with the patient today. The patient understands that the risks include but are not limited to bleeding, infection, pneumothorax, perforation, tamponade, vascular damage, renal failure, MI, stroke, death,  and lead dislodgement and wishes to proceed.    JaThompson GrayerMD 07/08/2014 11:47 AM

## 2014-07-08 NOTE — Interval H&P Note (Signed)
History and Physical Interval Note:  07/08/2014 11:50 AM  [name]  has presented today for surgery, with the diagnosis of bradicardia  The various methods of treatment have been discussed with the patient and family. After consideration of risks, benefits and other options for treatment, the patient has consented to  Procedure(s): PERMANENT PACEMAKER INSERTION (N/A) as a surgical intervention .  The patient's history has been reviewed, patient examined, no change in status, stable for surgery.  I have reviewed the patient's chart and labs.  Questions were answered to the patient's satisfaction.     [name]

## 2014-07-08 NOTE — H&P (View-Only) (Signed)
SUBJECTIVE: The patient is doing well today.  At this time, she denies chest pain, shortness of breath, or any new concerns.  . ALPRAZolam  1 mg Oral QHS  . aspirin  81 mg Oral Daily  .  ceFAZolin (ANCEF) IV  2 g Intravenous On Call  . docusate sodium  100 mg Oral Daily  . escitalopram  5 mg Oral Daily  . fenofibrate  160 mg Oral Daily  . fluticasone  1 spray Each Nare Daily  . gentamicin irrigation  80 mg Irrigation On Call  . meloxicam  15 mg Oral Daily  . montelukast  10 mg Oral Daily  . multivitamin with minerals  1 tablet Oral Daily  . pantoprazole  40 mg Oral Daily   . sodium chloride 50 mL/hr (07/08/14 0919)    OBJECTIVE: Physical Exam: Filed Vitals:   07/08/14 0038 07/08/14 0600 07/08/14 0622 07/08/14 0927  BP: 116/60  138/72 128/68  Pulse: 48  48 48  Temp: 98.8 F (37.1 C)  98.1 F (36.7 C) 98.3 F (36.8 C)  TempSrc: Oral  Oral Oral  Resp: 13  16 12   Height:      Weight:  154 lb 5.2 oz (70 kg)    SpO2: 91%  99% 98%    Intake/Output Summary (Last 24 hours) at 07/08/14 1147 Last data filed at 07/07/14 2200  Gross per 24 hour  Intake  843.5 ml  Output      0 ml  Net  843.5 ml    Telemetry reveals sinus bradycardia, HR 40s  GEN- The patient is overweight appearing, alert and oriented x 3 today.   Head- normocephalic, atraumatic Eyes-  Sclera clear, conjunctiva pink Ears- hearing intact Oropharynx- clear Neck- supple,   Lungs- Clear to ausculation bilaterally, normal work of breathing Heart- bradycardic regular rhythm GI- soft, NT, ND, + BS Extremities- no clubbing, cyanosis, or edema Skin- no rash or lesion Psych- euthymic mood, full affect Neuro- strength and sensation are intact  LABS: Basic Metabolic Panel:  Recent Labs  07/05/14 1416 07/06/14 0607  NA 142 140  K 4.0 4.1  CL 106 104  CO2 30 27  GLUCOSE 123* 103*  BUN 7 <5*  CREATININE 0.68 0.61  CALCIUM 9.6 9.1   Liver Function Tests: No results for input(s): AST, ALT, ALKPHOS,  BILITOT, PROT, ALBUMIN in the last 72 hours. No results for input(s): LIPASE, AMYLASE in the last 72 hours. CBC:  Recent Labs  07/07/14 0443 07/08/14 0349  WBC 5.7 6.2  HGB 12.5 12.8  HCT 38.3 39.0  MCV 82.5 83.5  PLT 307 312   Fasting Lipid Panel:  Recent Labs  07/06/14 0607  CHOL 196  HDL 42  LDLCALC 139*  TRIG 75  CHOLHDL 4.7   Thyroid Function Tests:  Recent Labs  07/08/14 0349  TSH 1.518   Anemia Panel: No results for input(s): VITAMINB12, FOLATE, FERRITIN, TIBC, IRON, RETICCTPCT in the last 72 hours.  RADIOLOGY: Dg Chest 2 View  07/05/2014   CLINICAL DATA:  Chest pain, shortness of breath and nausea for 3 days.  EXAM: CHEST  2 VIEW  COMPARISON:  Single view of the chest 03/10/2014 and 12/27/2012. CT chest 10/05/2012.  FINDINGS: Heart size is upper normal. The lungs are clear. There is no pneumothorax or pleural effusion. No focal bony abnormality is identified.  IMPRESSION: No acute disease.   Electronically Signed   By: Inge Rise M.D.   On: 07/05/2014 15:53   Nm Myocar Multi  W/spect W/wall Motion / Ef  07/07/2014   CLINICAL DATA:  Chest pain at rest.  EXAM: MYOCARDIAL IMAGING WITH SPECT (REST AND PHARMACOLOGIC-STRESS)  GATED LEFT VENTRICULAR WALL MOTION STUDY  LEFT VENTRICULAR EJECTION FRACTION  TECHNIQUE: Standard myocardial SPECT imaging was performed after resting intravenous injection of 10 mCi Tc-64msestamibi. Subsequently, intravenous infusion of Lexiscan was performed under the supervision of the Cardiology staff. At peak effect of the drug, 30 mCi Tc-972mestamibi was injected intravenously and standard myocardial SPECT imaging was performed. Quantitative gated imaging was also performed to evaluate left ventricular wall motion, and estimate left ventricular ejection fraction.  COMPARISON:  None.  FINDINGS: Perfusion: No decreased activity in the left ventricle on stress imaging to suggest reversible ischemia or infarction.  Wall Motion: Normal left  ventricular wall motion. No left ventricular dilation.  Left Ventricular Ejection Fraction: 76 %  End diastolic volume 45 ml  End systolic volume 14 ml  IMPRESSION: 1. No reversible ischemia or infarction.  2. Normal left ventricular wall motion.  3. Left ventricular ejection fraction 76%  4. Low-risk stress test findings*.  *2012 Appropriate Use Criteria for Coronary Revascularization Focused Update: J Am Coll Cardiol. 206333;54(5):625-638http://content.onairportbarriers.comspx?articleid=1201161   Electronically Signed   By: KeRolm Baptise.D.   On: 07/07/2014 18:01    ASSESSMENT AND PLAN:  Active Problems:   Chest pain at rest  1. Sick sinus syndrome The patient has symptomatic sinus bradycardia.  No reversible causes have been found.  I would therefore recommend pacemaker implantation at this time.  Risks, benefits, alternatives to pacemaker implantation were discussed in detail with the patient today. The patient understands that the risks include but are not limited to bleeding, infection, pneumothorax, perforation, tamponade, vascular damage, renal failure, MI, stroke, death,  and lead dislodgement and wishes to proceed.    JaThompson GrayerMD 07/08/2014 11:47 AM

## 2014-07-08 NOTE — Progress Notes (Signed)
Ref: JEFFERY,[name], PA-C   Subjective:  Heart rate down to 40's. Hip pain from bursitis. Awaiting PPM placement.  Objective:  Vital Signs in the last 24 hours: Temp:  [98.1 F (36.7 C)-98.8 F (37.1 C)] 98.1 F (36.7 C) (02/02 0622) Pulse Rate:  [48-61] 48 (02/02 0622) Cardiac Rhythm:  [-] Sinus bradycardia (02/02 0400) Resp:  [13-25] 16 (02/02 0622) BP: (116-197)/(41-97) 138/72 mmHg (02/02 0622) SpO2:  [91 %-99 %] 99 % (02/02 0622) Weight:  [70 kg (154 lb 5.2 oz)] 70 kg (154 lb 5.2 oz) (02/02 0600)  Physical Exam: BP Readings from Last 1 Encounters:  07/08/14 138/72    Wt Readings from Last 1 Encounters:  07/08/14 70 kg (154 lb 5.2 oz)    Weight change: -3.1 kg (-6 lb 13.4 oz)  HEENT: [place]/AT, Eyes-Brown, PERL, EOMI, Conjunctiva-Pink, Sclera-Non-icteric Neck: No JVD, No bruit, Trachea midline. Lungs:  Clear, Bilateral. Cardiac:  Regular rhythm, normal S1 and S2, no S3.  Abdomen:  Soft, non-tender. Extremities:  No edema present. No cyanosis. No clubbing. CNS: AxOx3, Cranial nerves grossly intact, moves all 4 extremities. Right handed. Skin: Warm and dry.   Intake/Output from previous day: 02/01 0701 - 02/02 0700 In: 843.5 [P.O.:720; I.V.:123.5] Out: -     Lab Results: BMET    Component Value Date/Time   NA 140 07/06/2014 0607   NA 142 07/05/2014 1416   NA 142 03/26/2014 1217   K 4.1 07/06/2014 0607   K 4.0 07/05/2014 1416   K 4.4 03/26/2014 1217   CL 104 07/06/2014 0607   CL 106 07/05/2014 1416   CL 107 03/26/2014 1217   CO2 27 07/06/2014 0607   CO2 30 07/05/2014 1416   CO2 27 03/26/2014 1217   GLUCOSE 103* 07/06/2014 0607   GLUCOSE 123* 07/05/2014 1416   GLUCOSE 94 03/26/2014 1217   BUN <5* 07/06/2014 0607   BUN 7 07/05/2014 1416   BUN 11 03/26/2014 1217   CREATININE 0.61 07/06/2014 0607   CREATININE 0.68 07/05/2014 1416   CREATININE 0.65 03/26/2014 1217   CREATININE 0.65 10/22/2013 1059   CREATININE 0.71 05/21/2013 0844   CREATININE 0.68  12/28/2012 0525   CALCIUM 9.1 07/06/2014 0607   CALCIUM 9.6 07/05/2014 1416   CALCIUM 9.3 03/26/2014 1217   GFRNONAA >90 07/06/2014 0607   GFRNONAA >90 07/05/2014 1416   GFRNONAA >89 10/22/2013 1059   GFRNONAA >89 05/21/2013 0844   GFRNONAA >90 12/28/2012 0525   GFRAA >90 07/06/2014 0607   GFRAA >90 07/05/2014 1416   GFRAA >89 10/22/2013 1059   GFRAA >89 05/21/2013 0844   GFRAA >90 12/28/2012 0525   CBC    Component Value Date/Time   WBC 6.2 07/08/2014 0349   WBC 6.8 09/25/2012 1120   RBC 4.67 07/08/2014 0349   RBC 4.67 09/25/2012 1120   HGB 12.8 07/08/2014 0349   HGB 12.3 09/25/2012 1120   HCT 39.0 07/08/2014 0349   HCT 39.9 09/25/2012 1120   PLT 312 07/08/2014 0349   MCV 83.5 07/08/2014 0349   MCV 85.5 09/25/2012 1120   MCH 27.4 07/08/2014 0349   MCH 26.3* 09/25/2012 1120   MCHC 32.8 07/08/2014 0349   MCHC 30.8* 09/25/2012 1120   RDW 12.9 07/08/2014 0349   LYMPHSABS 3.0 11/07/2011 1604   MONOABS 0.6 11/07/2011 1604   EOSABS 0.1 11/07/2011 1604   BASOSABS 0.0 11/07/2011 1604   HEPATIC Function Panel  Recent Labs  10/22/13 1059 03/26/14 1217  PROT 7.3 7.0   HEMOGLOBIN A1C No components  found for: HGA1C,  MPG CARDIAC ENZYMES Lab Results  Component Value Date   CKTOTAL 139 11/20/2007   TROPONINI <0.30 11/07/2011   BNP No results for input(s): PROBNP in the last 8760 hours. TSH  Recent Labs  03/26/14 1215 07/08/14 0349  TSH 0.832 1.518   CHOLESTEROL  Recent Labs  10/22/13 1059 03/26/14 1215 07/06/14 0607  CHOL 189 167 196    Scheduled Meds: . ALPRAZolam  1 mg Oral QHS  . aspirin  81 mg Oral Daily  .  ceFAZolin (ANCEF) IV  2 g Intravenous On Call  . diazepam  5 mg Oral Once  . docusate sodium  100 mg Oral Daily  . escitalopram  5 mg Oral Daily  . fenofibrate  160 mg Oral Daily  . fluticasone  1 spray Each Nare Daily  . gentamicin irrigation  80 mg Irrigation On Call  . meloxicam  15 mg Oral Daily  . montelukast  10 mg Oral Daily  .  multivitamin with minerals  1 tablet Oral Daily  . pantoprazole  40 mg Oral Daily   Continuous Infusions: . sodium chloride 50 mL/hr (07/08/14 0919)   PRN Meds:.albuterol, nitroGLYCERIN, ondansetron (ZOFRAN) IV, oxyCODONE-acetaminophen  Assessment/Plan: Chest pain Sinus bradycardia-Sick sinus syndrome Weakness and dizziness from above Anxiety Fibromyalgia  One dose Valium.  PPM today.     LOS: 3 days    Dixie Dials  MD  07/08/2014, 9:25 AM

## 2014-07-09 ENCOUNTER — Inpatient Hospital Stay (HOSPITAL_COMMUNITY): Payer: Medicare Other

## 2014-07-09 ENCOUNTER — Encounter (HOSPITAL_COMMUNITY): Payer: Self-pay | Admitting: Internal Medicine

## 2014-07-09 NOTE — Progress Notes (Signed)
Discharge instructions reviewed with patient. Pt awaiting ride home.

## 2014-07-09 NOTE — Progress Notes (Signed)
SUBJECTIVE: The patient is doing well today.  At this time, she denies chest pain, shortness of breath, or any new concerns.  She has typical post procedure soreness.  S/p PPM implant 07/08/14 for symptomatic bradycardia.     CURRENT MEDICATIONS: . ALPRAZolam  1 mg Oral QHS  . docusate sodium  100 mg Oral Daily  . escitalopram  5 mg Oral Daily  . fenofibrate  160 mg Oral Daily  . fluticasone  1 spray Each Nare Daily  . meloxicam  15 mg Oral Daily  . montelukast  10 mg Oral Daily  . multivitamin with minerals  1 tablet Oral Daily  . pantoprazole  40 mg Oral Daily  . sodium chloride  3 mL Intravenous Q12H      OBJECTIVE: Physical Exam: Filed Vitals:   07/08/14 2354 07/09/14 0000 07/09/14 0400 07/09/14 0402  BP: 135/79 125/66 132/80 132/80  Pulse: 60 58 60 60  Temp: 98.1 F (36.7 C)   97.9 F (36.6 C)  TempSrc: Oral   Oral  Resp: 18 19 14 17   Height:      Weight:      SpO2: 98% 96% 95% 97%    Intake/Output Summary (Last 24 hours) at 07/09/14 0636 Last data filed at 07/08/14 2300  Gross per 24 hour  Intake    973 ml  Output      0 ml  Net    973 ml    Telemetry reveals atrial pacing with intrinsic ventricular conduction  GEN- The patient is well appearing, alert and oriented x 3 today.   Head- normocephalic, atraumatic Eyes-  Sclera clear, conjunctiva pink Ears- hearing intact Oropharynx- clear Neck- supple, no JVP Lymph- no cervical lymphadenopathy Lungs- Clear to ausculation bilaterally, normal work of breathing Heart- Regular rate and rhythm, no murmurs, rubs or gallops, PMI not laterally displaced GI- soft, NT, ND, + BS Extremities- no clubbing, cyanosis, or edema Skin- no rash or lesion, left chest pacemaker pocket without hematoma Psych- euthymic mood, full affect Neuro- strength and sensation are intact  LABS: CBC:  Recent Labs  07/07/14 0443 07/08/14 0349  WBC 5.7 6.2  HGB 12.5 12.8  HCT 38.3 39.0  MCV 82.5 83.5  PLT 307 312   Thyroid  Function Tests:  Recent Labs  07/08/14 0349  TSH 1.518    RADIOLOGY: Dg Chest 2 View 07/05/2014   CLINICAL DATA:  Chest pain, shortness of breath and nausea for 3 days.  EXAM: CHEST  2 VIEW  COMPARISON:  Single view of the chest 03/10/2014 and 12/27/2012. CT chest 10/05/2012.  FINDINGS: Heart size is upper normal. The lungs are clear. There is no pneumothorax or pleural effusion. No focal bony abnormality is identified.  IMPRESSION: No acute disease.   Electronically Signed   By: Inge Rise M.D.   On: 07/05/2014 15:53   Nm Myocar Multi W/spect W/wall Motion / Ef 07/07/2014   CLINICAL DATA:  Chest pain at rest.  EXAM: MYOCARDIAL IMAGING WITH SPECT (REST AND PHARMACOLOGIC-STRESS)  GATED LEFT VENTRICULAR WALL MOTION STUDY  LEFT VENTRICULAR EJECTION FRACTION  TECHNIQUE: Standard myocardial SPECT imaging was performed after resting intravenous injection of 10 mCi Tc-47msestamibi. Subsequently, intravenous infusion of Lexiscan was performed under the supervision of the Cardiology staff. At peak effect of the drug, 30 mCi Tc-983mestamibi was injected intravenously and standard myocardial SPECT imaging was performed. Quantitative gated imaging was also performed to evaluate left ventricular wall motion, and estimate left ventricular ejection fraction.  COMPARISON:  None.  FINDINGS: Perfusion: No decreased activity in the left ventricle on stress imaging to suggest reversible ischemia or infarction.  Wall Motion: Normal left ventricular wall motion. No left ventricular dilation.  Left Ventricular Ejection Fraction: 76 %  End diastolic volume 45 ml  End systolic volume 14 ml  IMPRESSION: 1. No reversible ischemia or infarction.  2. Normal left ventricular wall motion.  3. Left ventricular ejection fraction 76%  4. Low-risk stress test findings*.  *2012 Appropriate Use Criteria for Coronary Revascularization Focused Update: J Am Coll Cardiol. 3382;50(5):397-673.  http://content.[email or web address].aspx?articleid=[email or web address]   Electronically Signed   By: Rolm Baptise M.D.   On: 07/07/2014 18:01    ASSESSMENT AND PLAN:  Active Problems:   Chest pain at rest   Sick sinus syndrome  1. Symptomatic sinus bradycardia Doing well s/p PPM implant Device interrogation reviewed with normal function - see paper chart CXR demonstrated no ptx and stable leads Wound care, restrictions reviewed with patient.  Wound check 07/17/14 at 9:30AM at [facility].    Ok to discharge home from an EP standpoint.

## 2014-07-09 NOTE — Discharge Instructions (Signed)
° °  Supplemental Discharge Instructions for  Pacemaker/Defibrillator Patients  Activity No heavy lifting or vigorous activity with your left/right arm for 6 to 8 weeks.  Do not raise your left/right arm above your head for one week.  Gradually raise your affected arm as drawn below.                       02/06                   02/07                    02/08                    02/09            NO DRIVING for 1 week    ; you may begin driving on     67/61     . WOUND CARE - Keep the wound area clean and dry.  Do not get this area wet for one week. No showers for one week; you may shower on      02/10        . - The tape/steri-strips on your wound will fall off; do not pull them off.  No bandage is needed on the site.  DO  NOT apply any creams, oils, or ointments to the wound area. - If you notice any drainage or discharge from the wound, any swelling or bruising at the site, or you develop a fever > 101? F after you are discharged home, call the office at once.  Special Instructions - You are still able to use cellular telephones; use the ear opposite the side where you have your pacemaker/defibrillator.  Avoid carrying your cellular phone near your device. - When traveling through airports, show security personnel your identification card to avoid being screened in the metal detectors.  Ask the security personnel to use the hand wand. - Avoid arc welding equipment, MRI testing (magnetic resonance imaging), TENS units (transcutaneous nerve stimulators).  Call the office for questions about other devices. - Avoid electrical appliances that are in poor condition or are not properly grounded. - Microwave ovens are safe to be near or to operate.  Additional information for defibrillator patients should your device go off: - If your device goes off ONCE and you feel fine afterward, notify the device clinic nurses. - If your device goes off ONCE and you do not feel well afterward, call 911. - If  your device goes off TWICE, call 911. - If your device goes off THREE times in one day, call 911.  DO NOT DRIVE YOURSELF OR A FAMILY MEMBER WITH A DEFIBRILLATOR TO THE HOSPITAL--CALL 911.

## 2014-07-09 NOTE — Care Management Note (Signed)
    Page 1 of 1   07/09/2014     3:28:28 PM CARE MANAGEMENT NOTE 07/09/2014  Patient:  [name], [name]   Account Number:  [id number]  Date Initiated:  07/09/2014  Documentation initiated by:  Elowyn Raupp  Subjective/Objective Assessment:   dx bradycardia 2/2 SSS; lives alone    PCP  [name] PA     Action/Plan:   Anticipated DC Date:  07/09/2014   Anticipated DC Plan:  [place]  CM consult      Choice offered to / List presented to:             Status of service:  Completed, signed off Medicare Important Message given?  YES (If response is "NO", the following Medicare IM given date fields will be blank) Date Medicare IM given:  07/09/2014 Medicare IM given by:  Merial Moritz Date Additional Medicare IM given:   Additional Medicare IM given by:    Discharge Disposition:  HOME/SELF CARE  Per UR Regulation:  Reviewed for med. necessity/level of care/duration of stay  If discussed at [place] of Stay Meetings, dates discussed:    Comments:

## 2014-07-09 NOTE — Discharge Summary (Signed)
Physician Discharge Summary  Patient ID: [name] MRN: [id number] DOB/AGE: 09/29/1955 59 y.o.  Admit date: 07/05/2014 Discharge date: 07/09/2014  Admission Diagnoses: Chest pain Sinus bradycardia-sick sinus syndrome Weakness and dizziness from above Anxiety Fibromyalgia  Discharge Diagnoses:  Principle Problem: * Sick sinus syndrome * Chest pain Anxiety Fibromyalgia  Discharged Condition: fair  Hospital Course: 59 year old female has known sinus bradycardia for many years now has heart rate in 30's with persistent fatigue for the past 3-4 days with intermittent indigestion and malaise. Also started having left sided chest pain radiating to her left shoulder/arm. She had no reversible ischemia on her nuclear stress test. She then underwent PPM placement by Dr. Rayann Heman for her sick sinus syndrome. She was stable post procedure and was discharged home in stable condition.  Consults: cardiology  Significant Diagnostic Studies: labs: Normal CBC and BMET with borderline elevated sugar level.  EKG-Sinus bradycardia followed by A-paced rhythm.  Chest X-ray:  1. Cardiac pacer with lead tips in right atrium right ventricle. No complicating features. 2. Cardiomegaly. No pulmonary venous congestion.  Nuclear stress test:  1. No reversible ischemia or infarction. 2. Normal left ventricular wall motion. 3. Left ventricular ejection fraction 76%. 4. Low-risk stress test findings*.  Treatments: Successful implantation of a Medtronic Adapta L dual-chamber pacemaker for symptomatic sinus bradycardia  Discharge Exam: Blood pressure 132/80, pulse 60, temperature 98.3 F (36.8 C), temperature source Oral, resp. rate 17, height 5' 7"  (1.702 m), weight 70 kg (154 lb 5.2 oz), SpO2 97 %. HEENT: [place]/AT, Eyes-Brown, PERL, EOMI, Conjunctiva-Pink, Sclera-Non-icteric Neck: No JVD, No bruit, Trachea midline. Lungs: Clear, Bilateral. Left pectoral pace pocket with mild tenderness but no  hematoma. Cardiac: Regular rhythm, normal S1 and S2, no S3.  Abdomen: Soft, non-tender. Extremities: No edema present. No cyanosis. No clubbing. CNS: AxOx3, Cranial nerves grossly intact, moves all 4 extremities. Right handed. Skin: Warm and dry.  Disposition: 01-Home or Self Care     Medication List    STOP taking these medications        ketorolac 0.4 % Soln  Commonly known as:  ACULAR     LINZESS 145 MCG Caps capsule  Generic drug:  Linaclotide     meloxicam 15 MG tablet  Commonly known as:  MOBIC      TAKE these medications        albuterol 108 (90 BASE) MCG/ACT inhaler  Commonly known as:  PROVENTIL HFA;VENTOLIN HFA  Inhale 2 puffs into the lungs every 6 (six) hours as needed for wheezing.     aspirin 81 MG chewable tablet  Chew 81 mg by mouth daily.     beclomethasone 40 MCG/ACT inhaler  Commonly known as:  QVAR  Inhale 1-2 puffs into the lungs 2 (two) times daily.     calcipotriene-betamethasone ointment  Commonly known as:  TACLONEX  Apply 1 application topically daily.     dexlansoprazole 60 MG capsule  Commonly known as:  DEXILANT  Take 60 mg by mouth daily as needed (for acid reflux).     diazepam 5 MG tablet  Commonly known as:  VALIUM  Take 1 tablet (5 mg total) by mouth daily.     dicyclomine 10 MG capsule  Commonly known as:  BENTYL  Take 10 mg by mouth 3 (three) times daily as needed (for stomach).     docusate sodium 100 MG capsule  Commonly known as:  COLACE  Take 100 mg by mouth daily.     escitalopram 5 MG tablet  Commonly known as:  LEXAPRO  Take 5 mg by mouth daily.     fenofibrate 160 MG tablet  Take 1 tablet (160 mg total) by mouth daily.     fluticasone 50 MCG/ACT nasal spray  Commonly known as:  FLONASE  USE 2 SPRAYS INTO EACH NOSTRIL EVERY DAY     furosemide 20 MG tablet  Commonly known as:  LASIX  Take 1 tablet (20 mg total) by mouth daily as needed (leg swelling).     hydrOXYzine 25 MG tablet  Commonly known  as:  ATARAX/VISTARIL  Take 0.5-2 tablets (12.5-50 mg total) by mouth at bedtime as needed for anxiety (insomnia).     ipratropium 0.03 % nasal spray  Commonly known as:  ATROVENT  USE 2 SPRAYS INTO THE NOSE EVERY 12 (TWELVE) HOURS.     metaxalone 800 MG tablet  Commonly known as:  SKELAXIN  Take 400-800 mg by mouth daily as needed for pain (for muscle spasms).     montelukast 10 MG tablet  Commonly known as:  SINGULAIR  Take 1 tablet (10 mg total) by mouth daily.     multivitamin tablet  Take 1 tablet by mouth daily.     NITROSTAT 0.4 MG SL tablet  Generic drug:  nitroGLYCERIN  Place 0.4 mg under the tongue every 5 (five) minutes as needed for chest pain. For chest pain.     OVER THE COUNTER MEDICATION  Pre menopause tablets taking bid     oxyCODONE-acetaminophen 5-325 MG per tablet  Commonly known as:  PERCOCET/ROXICET  Take 1 tablet by mouth every 8 (eight) hours as needed for pain.     PROBIOTIC DAILY Caps  Take 1 capsule by mouth 2 (two) times a week.     ranitidine 150 MG tablet  Commonly known as:  ZANTAC  Take 150 mg by mouth as needed for heartburn.     zolpidem 10 MG tablet  Commonly known as:  AMBIEN  Take 10 mg by mouth at bedtime as needed for sleep.           Follow-up Information    Follow up with JEFFERY,[name], PA-C. Schedule an appointment as soon as possible for a visit in 1 month.   Specialty:  Physician Assistant   Contact information:   [place] [address] [phone]       Follow up with [facility] S, MD. Schedule an appointment as soon as possible for a visit in 1 week.   Specialty:  Cardiology   Contact information:   [place] [id number] [phone]       Follow up with [facility] On 07/17/2014.   Why:  Wound check and device check at 9:30 am   Contact information:   [address] [address]       Signed: Birdie Riddle 07/09/2014, 9:53 AM

## 2014-07-11 ENCOUNTER — Encounter: Payer: Self-pay | Admitting: Physician Assistant

## 2014-07-11 DIAGNOSIS — J310 Chronic rhinitis: Secondary | ICD-10-CM

## 2014-07-13 ENCOUNTER — Other Ambulatory Visit: Payer: Self-pay | Admitting: Physician Assistant

## 2014-07-14 MED ORDER — MONTELUKAST SODIUM 10 MG PO TABS: 10.0000 mg | ORAL_TABLET | Freq: Every day | ORAL | Status: DC

## 2014-07-14 NOTE — Telephone Encounter (Signed)
Waiting for clarification from the patient re: foot cream and xanax.  Sent Singulair.

## 2014-07-15 NOTE — Telephone Encounter (Signed)
Faxed Rx

## 2014-07-16 MED ORDER — DOCUSATE SODIUM 100 MG PO CAPS: 100.0000 mg | ORAL_CAPSULE | Freq: Every day | ORAL | Status: DC | PRN

## 2014-07-17 ENCOUNTER — Ambulatory Visit (INDEPENDENT_AMBULATORY_CARE_PROVIDER_SITE_OTHER): Payer: Medicare Other | Admitting: *Deleted

## 2014-07-17 DIAGNOSIS — I495 Sick sinus syndrome: Secondary | ICD-10-CM

## 2014-07-17 LAB — MDC_IDC_ENUM_SESS_TYPE_INCLINIC
Brady Statistic AP VS Percent: 81.13 %
Brady Statistic AS VP Percent: 0 %
Brady Statistic RA Percent Paced: 81.16 %
Date Time Interrogation Session: 20160211095600
Lead Channel Impedance Value: 456 Ohm
Lead Channel Impedance Value: 570 Ohm
Lead Channel Impedance Value: 589 Ohm
Lead Channel Pacing Threshold Amplitude: 1 V
Lead Channel Pacing Threshold Pulse Width: 0.4 ms
Lead Channel Sensing Intrinsic Amplitude: 28.75 mV
Lead Channel Sensing Intrinsic Amplitude: 31.625 mV
Lead Channel Sensing Intrinsic Amplitude: 4.625 mV
Lead Channel Setting Pacing Amplitude: 3.5 V
Lead Channel Setting Pacing Amplitude: 3.5 V
Lead Channel Setting Sensing Sensitivity: 2.8 mV
MDC IDC MSMT BATTERY VOLTAGE: 3.15 V
MDC IDC MSMT LEADCHNL RA IMPEDANCE VALUE: 456 Ohm
MDC IDC MSMT LEADCHNL RA PACING THRESHOLD AMPLITUDE: 0.75 V
MDC IDC MSMT LEADCHNL RA PACING THRESHOLD PULSEWIDTH: 0.4 ms
MDC IDC MSMT LEADCHNL RA SENSING INTR AMPL: 6 mV
MDC IDC SET LEADCHNL RV PACING PULSEWIDTH: 0.4 ms
MDC IDC STAT BRADY AP VP PERCENT: 0.04 %
MDC IDC STAT BRADY AS VS PERCENT: 18.84 %
MDC IDC STAT BRADY RV PERCENT PACED: 0.04 %
Zone Setting Detection Interval: 400 ms
Zone Setting Detection Interval: 400 ms

## 2014-07-17 NOTE — Progress Notes (Signed)

## 2014-07-25 ENCOUNTER — Encounter: Payer: Self-pay | Admitting: Internal Medicine

## 2014-07-31 ENCOUNTER — Ambulatory Visit (INDEPENDENT_AMBULATORY_CARE_PROVIDER_SITE_OTHER): Payer: 59 | Admitting: Psychiatry

## 2014-07-31 DIAGNOSIS — F331 Major depressive disorder, recurrent, moderate: Secondary | ICD-10-CM

## 2014-08-19 ENCOUNTER — Ambulatory Visit (INDEPENDENT_AMBULATORY_CARE_PROVIDER_SITE_OTHER): Payer: 59 | Admitting: Psychiatry

## 2014-08-19 DIAGNOSIS — F331 Major depressive disorder, recurrent, moderate: Secondary | ICD-10-CM

## 2014-09-02 ENCOUNTER — Ambulatory Visit (INDEPENDENT_AMBULATORY_CARE_PROVIDER_SITE_OTHER): Payer: Medicare Other | Admitting: Emergency Medicine

## 2014-09-02 ENCOUNTER — Encounter: Payer: Self-pay | Admitting: Physician Assistant

## 2014-09-02 ENCOUNTER — Ambulatory Visit (INDEPENDENT_AMBULATORY_CARE_PROVIDER_SITE_OTHER): Payer: Medicare Other

## 2014-09-02 VITALS — BP 141/90 | HR 79 | Temp 97.7°F | Resp 16 | Ht 68.0 in | Wt 160.0 lb

## 2014-09-02 DIAGNOSIS — F418 Other specified anxiety disorders: Secondary | ICD-10-CM | POA: Diagnosis not present

## 2014-09-02 DIAGNOSIS — F32A Depression, unspecified: Secondary | ICD-10-CM

## 2014-09-02 DIAGNOSIS — E785 Hyperlipidemia, unspecified: Secondary | ICD-10-CM

## 2014-09-02 DIAGNOSIS — J45909 Unspecified asthma, uncomplicated: Secondary | ICD-10-CM | POA: Insufficient documentation

## 2014-09-02 DIAGNOSIS — R5383 Other fatigue: Secondary | ICD-10-CM | POA: Diagnosis not present

## 2014-09-02 DIAGNOSIS — Z113 Encounter for screening for infections with a predominantly sexual mode of transmission: Secondary | ICD-10-CM | POA: Diagnosis not present

## 2014-09-02 DIAGNOSIS — J4520 Mild intermittent asthma, uncomplicated: Secondary | ICD-10-CM

## 2014-09-02 DIAGNOSIS — M25562 Pain in left knee: Secondary | ICD-10-CM

## 2014-09-02 DIAGNOSIS — R739 Hyperglycemia, unspecified: Secondary | ICD-10-CM | POA: Diagnosis not present

## 2014-09-02 DIAGNOSIS — I495 Sick sinus syndrome: Secondary | ICD-10-CM | POA: Diagnosis not present

## 2014-09-02 DIAGNOSIS — M797 Fibromyalgia: Secondary | ICD-10-CM

## 2014-09-02 DIAGNOSIS — J310 Chronic rhinitis: Secondary | ICD-10-CM

## 2014-09-02 DIAGNOSIS — G4700 Insomnia, unspecified: Secondary | ICD-10-CM

## 2014-09-02 DIAGNOSIS — I10 Essential (primary) hypertension: Secondary | ICD-10-CM | POA: Diagnosis not present

## 2014-09-02 DIAGNOSIS — M810 Age-related osteoporosis without current pathological fracture: Secondary | ICD-10-CM

## 2014-09-02 DIAGNOSIS — F5104 Psychophysiologic insomnia: Secondary | ICD-10-CM

## 2014-09-02 DIAGNOSIS — F329 Major depressive disorder, single episode, unspecified: Secondary | ICD-10-CM

## 2014-09-02 DIAGNOSIS — F419 Anxiety disorder, unspecified: Secondary | ICD-10-CM

## 2014-09-02 LAB — COMPREHENSIVE METABOLIC PANEL
ALT: 38 U/L — ABNORMAL HIGH (ref 0–35)
AST: 27 U/L (ref 0–37)
Albumin: 4.3 g/dL (ref 3.5–5.2)
Alkaline Phosphatase: 45 U/L (ref 39–117)
BILIRUBIN TOTAL: 0.4 mg/dL (ref 0.2–1.2)
BUN: 9 mg/dL (ref 6–23)
CALCIUM: 9.7 mg/dL (ref 8.4–10.5)
CHLORIDE: 102 meq/L (ref 96–112)
CO2: 29 meq/L (ref 19–32)
CREATININE: 0.63 mg/dL (ref 0.50–1.10)
Glucose, Bld: 89 mg/dL (ref 70–99)
Potassium: 4.3 mEq/L (ref 3.5–5.3)
SODIUM: 139 meq/L (ref 135–145)
TOTAL PROTEIN: 7.5 g/dL (ref 6.0–8.3)

## 2014-09-02 LAB — LIPID PANEL
Cholesterol: 201 mg/dL — ABNORMAL HIGH (ref 0–200)
HDL: 38 mg/dL — ABNORMAL LOW (ref >=46)
LDL Cholesterol: 147 mg/dL — ABNORMAL HIGH (ref 0–99)
Total CHOL/HDL Ratio: 5.3 Ratio
Triglycerides: 82 mg/dL (ref <150)
VLDL: 16 mg/dL (ref 0–40)

## 2014-09-02 LAB — GLUCOSE, POCT (MANUAL RESULT ENTRY): POC Glucose: 108 mg/dl — AB (ref 70–99)

## 2014-09-02 MED ORDER — MONTELUKAST SODIUM 10 MG PO TABS: 10.0000 mg | ORAL_TABLET | Freq: Every day | ORAL | Status: DC

## 2014-09-02 MED ORDER — ALPRAZOLAM 1 MG PO TABS: ORAL_TABLET | ORAL | Status: DC

## 2014-09-02 MED ORDER — ESCITALOPRAM OXALATE 10 MG PO TABS: 10.0000 mg | ORAL_TABLET | Freq: Every day | ORAL | Status: DC

## 2014-09-02 NOTE — Patient Instructions (Signed)
I will contact you with your lab results as soon as they are available.   If you have not heard from me in 2 weeks, please contact me.  The fastest way to get your results is to register for My Chart (see the instructions on the last page of this printout).  Restart the Qvar, 2 puffs twice each day. You have refills at the pharmacy.

## 2014-09-02 NOTE — Progress Notes (Signed)
Patient ID: [name], female    DOB: 1956-05-13, 59 y.o.   MRN: [id number]  PCP: Tujuana Kilmartin, PA-C  Subjective:   Chief Complaint  Patient presents with  . follow up from hospital  . Bloodwork  . wants HIV and Hepatitis  . knee pain    left    HPI  Presents for several issues:  1. 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 Still feels tired. "I've just been laying around, sleeping." Depressed. During her recovery, no one from her church came to visit or help. "That Lexapro is only 5 mg. I wonder if it needs to be higher." Continues to see a therapist.  2. Due for update of fasting labs (cholesterol and glucose, which was elevated during her hospitalization).  3. Desires STI screening. After 10 years on celibacy, she was intimate with a man she trusted. Later, she found that he was less trustworthy. No penis-vagina contact. She received oral sex. Her risk for STI is low, but not zero.   4. Left knee is popping and clicking. Wonders if it's related to inactivity since her pacemaker placement. No specific trauma or injury. Tomorrow she sees specialist for monthly metatarsal injection. Unable to walk well due to knee and foot pain.   Has done well this past winter with Singulair and having taken the flu vaccine. Has been relatively healthy. Even notes that her hair is growing back.  Review of Systems Review of Systems  Constitutional: Positive for fatigue. Negative for fever and chills. Diaphoresis: hot flashes.  HENT: Negative.   Eyes: Positive for itching. Negative for photophobia, pain, discharge, redness and visual disturbance.  Respiratory: Positive for chest tightness and shortness of breath. Negative for apnea, cough, choking, wheezing and stridor.        Uses rescue inhaler 3-4 times/week, 1-2 times/month at night.  Cardiovascular: Positive for chest pain (with exertion). Negative for palpitations and leg swelling.  Gastrointestinal:  Negative.   Endocrine: Negative for cold intolerance, polydipsia, polyphagia and polyuria. Heat intolerance: hot flashes.  Genitourinary: Positive for difficulty urinating (urinary hesitency). Negative for dysuria, urgency, frequency, hematuria, flank pain, decreased urine volume, vaginal bleeding, vaginal discharge, enuresis, genital sores, vaginal pain, pelvic pain and dyspareunia.  Musculoskeletal: Positive for myalgias, arthralgias and gait problem. Negative for joint swelling.  Allergic/Immunologic: Positive for environmental allergies. Negative for food allergies and immunocompromised state.  Neurological: Negative.   Hematological: Negative.   Psychiatric/Behavioral: Positive for sleep disturbance and dysphoric mood. Negative for suicidal ideas, behavioral problems, confusion, self-injury and agitation. The patient is nervous/anxious.        Patient Active Problem List   Diagnosis Date Noted  . Sick sinus syndrome 07/08/2014  . Chest pain at rest 07/05/2014  . Chronic rhinitis 10/22/2013  . Fatty liver 10/22/2013  . Interstitial cystitis 10/22/2013  . Hip pain, right 10/22/2013  . Chronic insomnia 09/25/2012  . HTN (hypertension) 09/25/2012  . Hyperlipidemia 09/25/2012  . Hyperglycemia 05/22/2012  . GERD 12/04/2008  . PERSONAL HX COLONIC POLYPS 12/04/2008  . CONSTIPATION 10/21/2008  . Fibromyalgia 10/21/2008  . LACTOSE INTOLERANCE 11/20/2007  . DIVERTICULITIS, ACUTE 11/20/2007  . CHEST PAIN 11/20/2007  . Anxiety and depression 08/02/2007  . OSTEOPOROSIS 08/02/2007  . DIVERTICULOSIS, COLON 11/16/2006  . EXTERNAL HEMORRHOIDS 05/12/2005     Prior to Admission medications   Medication Sig Start Date End Date Taking? Authorizing Provider  albuterol (PROVENTIL HFA;VENTOLIN HFA) 108 (90 BASE) MCG/ACT inhaler Inhale 2 puffs into the lungs every 6 (six)  hours as needed for wheezing. 08/15/13  Yes Jaysion Ramseyer Janalee Dane, PA-C  ALPRAZolam (XANAX) 1 MG tablet TAKE 1 TABLET TWICE A DAY  AS NEEDED FOR ANXIETY 07/14/14  Yes Makaelah Cranfield S Anneke Cundy, PA-C  aspirin 81 MG chewable tablet Chew 81 mg by mouth daily.   Yes Historical Provider, MD  Azilsartan Medoxomil 40 MG TABS Take 20 mg by mouth daily.   Yes Historical Provider, MD  beclomethasone (QVAR) 40 MCG/ACT inhaler Inhale 1-2 puffs into the lungs 2 (two) times daily. 03/26/14 03/27/15 Yes Stephanie D English, PA  calcipotriene-betamethasone (TACLONEX) ointment Apply 1 application topically daily.   Yes Historical Provider, MD  dexlansoprazole (DEXILANT) 60 MG capsule Take 60 mg by mouth daily as needed (for acid reflux).   Yes Historical Provider, MD  diazepam (VALIUM) 5 MG tablet Take 1 tablet (5 mg total) by mouth daily. 07/18/13  Yes Gurbani Figge S Kischa Altice, PA-C  docusate sodium (COLACE) 100 MG capsule Take 1 capsule (100 mg total) by mouth daily as needed for mild constipation. 07/16/14  Yes Clevland Cork S Tilmon Wisehart, PA-C  escitalopram (LEXAPRO) 5 MG tablet Take 5 mg by mouth daily.   Yes Historical Provider, MD  fenofibrate 160 MG tablet Take 1 tablet (160 mg total) by mouth daily. 10/22/13  Yes Lynita Groseclose S Boyde Grieco, PA-C  fluticasone (FLONASE) 50 MCG/ACT nasal spray USE 2 SPRAYS INTO EACH NOSTRIL EVERY DAY   Yes Junell Cullifer S Tenecia Ignasiak, PA-C  furosemide (LASIX) 20 MG tablet Take 1 tablet (20 mg total) by mouth daily as needed (leg swelling). 10/22/13  Yes Addis Bennie S Rooney Swails, PA-C  ipratropium (ATROVENT) 0.03 % nasal spray USE 2 SPRAYS INTO THE NOSE EVERY 12 (TWELVE) HOURS.   Yes Rodrigo Mcgranahan S Macguire Holsinger, PA-C  metaxalone (SKELAXIN) 800 MG tablet Take 400-800 mg by mouth daily as needed for pain (for muscle spasms).    Yes Historical Provider, MD  montelukast (SINGULAIR) 10 MG tablet Take 1 tablet (10 mg total) by mouth daily. 07/14/14  Yes Xzaiver Vayda S Dakhari Zuver, PA-C  Multiple Vitamin (MULTIVITAMIN) tablet Take 1 tablet by mouth daily.   Yes Historical Provider, MD  OVER THE COUNTER MEDICATION Pre menopause tablets taking bid   Yes Historical Provider, MD    oxyCODONE-acetaminophen (PERCOCET) 5-325 MG per tablet Take 1 tablet by mouth every 8 (eight) hours as needed for pain.    Yes Historical Provider, MD  Probiotic Product (PROBIOTIC DAILY) CAPS Take 1 capsule by mouth 2 (two) times a week.   Yes Historical Provider, MD  ranitidine (ZANTAC) 150 MG tablet Take 150 mg by mouth as needed for heartburn.    Yes Historical Provider, MD  zolpidem (AMBIEN) 10 MG tablet Take 10 mg by mouth at bedtime as needed for sleep.  09/28/13  Yes Historical Provider, MD  hydrOXYzine (ATARAX/VISTARIL) 25 MG tablet Take 0.5-2 tablets (12.5-50 mg total) by mouth at bedtime as needed for anxiety (insomnia). Patient not taking: Reported on 09/02/2014 07/18/13   Sharon Stapel S Jacson Rapaport, PA-C  NITROSTAT 0.4 MG SL tablet Place 0.4 mg under the tongue every 5 (five) minutes as needed for chest pain. For chest pain. 11/24/11   Historical Provider, MD     Allergies  Allergen Reactions  . Codeine Other (See Comments)    HALLUCINATIONS   . Propoxyphene N-Acetaminophen Other (See Comments)    HALLUCINATIONS  . Sulfonamide Derivatives Hives, Itching and Swelling  . Alka-Seltzer [Aspirin Effervescent] Other (See Comments)    "like she's fading away"  . Norvasc [Amlodipine Besylate]   . Iohexol Nausea Only  JITTERY    . Latex Hives and Itching       Objective:  Physical Exam  Physical Exam  Constitutional: She is oriented to person, place, and time. She appears well-developed and well-nourished. No distress.  BP 141/90 mmHg  Pulse 79  Temp(Src) 97.7 F (36.5 C) (Oral)  Resp 16  Ht 5' 8"  (1.727 m)  Wt 160 lb (72.576 kg)  BMI 24.33 kg/m2  SpO2 99%   HENT:  Head: Normocephalic and atraumatic.  Right Ear: Hearing, tympanic membrane, external ear and ear canal normal.  Left Ear: Hearing, tympanic membrane, external ear and ear canal normal.  Nose: Nose normal.  Mouth/Throat: Uvula is midline, oropharynx is clear and moist and mucous membranes are normal. Normal  dentition.  Eyes: Conjunctivae, EOM and lids are normal. Pupils are equal, round, and reactive to light. No scleral icterus.  Neck: Phonation normal. Neck supple. No thyromegaly present.  Cardiovascular: Normal rate, regular rhythm, normal heart sounds and intact distal pulses.   Pulmonary/Chest: Effort normal and breath sounds normal.    Musculoskeletal:       Right knee: Normal.       Left knee: Normal.  Both knees are normal on exam except for crepitus bilaterally with ROM. Non-tender.  Lymphadenopathy:    She has no cervical adenopathy.  Neurological: She is alert and oriented to person, place, and time. She has normal strength.  Skin: Skin is warm and dry.  Psychiatric: Her speech is normal and behavior is normal. Her mood appears not anxious. Her affect is not angry and not inappropriate. Cognition and memory are not impaired. She does not express inappropriate judgment. She exhibits a depressed mood (tearful when describing her disappointment in her church community for not visiting her after her procedure last month). She expresses no homicidal and no suicidal ideation. She expresses no suicidal plans and no homicidal plans.  Vitals reviewed.   Results for orders placed or performed in visit on 09/02/14  POCT glucose (manual entry)  Result Value Ref Range   POC Glucose 108 (A) 70 - 99 mg/dl   LEFT Knee: [facility] reading (PRIMARY) by  Dr. Everlene Farrier. Mild degenerative changes laterally. Otherwise, normal knee.      Assessment & Plan:  1. Sick sinus syndrome Pacemaker placed. Fatigue persists. Rate is appropriate. Likely this symptoms is due to depression.  2. Essential hypertension Minimally elevated today. Continue current lasix dose.  3. Anxiety and depression Increase Lexapro from 5 mg to 10 mg daily. RTC in 8 weeks, sooner if needed. Continue with therapy. - ALPRAZolam (XANAX) 1 MG tablet; TAKE 1 TABLET TWICE A DAY AS NEEDED FOR ANXIETY  Dispense: 60 tablet; Refill: 0 -  escitalopram (LEXAPRO) 10 MG tablet; Take 1 tablet (10 mg total) by mouth daily.  Dispense: 90 tablet; Refill: 3  4. Hyperlipidemia Await lab results. - Lipid panel  5. Chronic insomnia Continue to address anxiety and depression as above.  6. Hyperglycemia Await remaining labs. Anticipate addition of metformin. - POCT glucose (manual entry) - Comprehensive metabolic panel - Hemoglobin A1c  7. Fibromyalgia Stable.  8. Intrinsic asthma, mild intermittent, uncomplicated Resume daily use of Qvar to reduce need for rescue inhaler. - Spirometry: Peak  9. Chronic rhinitis Stable with the addition of Singulair. Continue Flonase. - montelukast (SINGULAIR) 10 MG tablet; Take 1 tablet (10 mg total) by mouth daily.  Dispense: 90 tablet; Refill: 3  10. Routine screening for STI (sexually transmitted infection) Counselled on low risk of STI, but agree  with screening. - Hepatitis C antibody - HIV antibody - Hepatitis B surface antigen - Hepatitis B surface antibody - GC/Chlamydia Probe Amp  11. Other fatigue Suspect this is due to uncontrolled depression. See above.  12. Osteoporosis Await results. May need supplementation. - Vit D  25 hydroxy (rtn osteoporosis monitoring)  13. Knee pain, left I suspect that her pain has more to do with reduced use than the mild degenerative changes seen on today's films. She can continue meloxicam and increase her activities, which will likely also help her mood. - DG Knee Complete 4 Views Left; Future   Return in about 8 weeks (around 10/28/2014) for Annual Exam and follow-up.  Fara Chute, PA-C Physician Assistant-Certified [facility]

## 2014-09-03 LAB — HEMOGLOBIN A1C
Hgb A1c MFr Bld: 6.3 % — ABNORMAL HIGH (ref <5.7)
Mean Plasma Glucose: 134 mg/dL — ABNORMAL HIGH (ref <117)

## 2014-09-03 LAB — GC/CHLAMYDIA PROBE AMP
CT Probe RNA: NEGATIVE
GC PROBE AMP APTIMA: NEGATIVE

## 2014-09-03 LAB — HEPATITIS B SURFACE ANTIBODY, QUANTITATIVE: Hepatitis B-Post: 0 m[IU]/mL

## 2014-09-03 LAB — HIV ANTIBODY (ROUTINE TESTING W REFLEX): HIV: NONREACTIVE

## 2014-09-03 LAB — HEPATITIS B SURFACE ANTIGEN: HEP B S AG: NEGATIVE

## 2014-09-03 LAB — HEPATITIS C ANTIBODY: HCV Ab: NEGATIVE

## 2014-09-03 LAB — VITAMIN D 25 HYDROXY (VIT D DEFICIENCY, FRACTURES): Vit D, 25-Hydroxy: 28 ng/mL — ABNORMAL LOW (ref 30–100)

## 2014-09-05 DIAGNOSIS — Z029 Encounter for administrative examinations, unspecified: Secondary | ICD-10-CM

## 2014-09-14 ENCOUNTER — Emergency Department (HOSPITAL_COMMUNITY)
Admission: EM | Admit: 2014-09-14 | Discharge: 2014-09-14 | Disposition: A | Discharge status: Home / Self Care | Payer: Medicare Other | Attending: Emergency Medicine | Admitting: Emergency Medicine

## 2014-09-14 ENCOUNTER — Encounter (HOSPITAL_COMMUNITY): Payer: Self-pay | Admitting: Emergency Medicine

## 2014-09-14 ENCOUNTER — Emergency Department (HOSPITAL_COMMUNITY): Payer: Medicare Other

## 2014-09-14 DIAGNOSIS — Z7982 Long term (current) use of aspirin: Secondary | ICD-10-CM | POA: Insufficient documentation

## 2014-09-14 DIAGNOSIS — K219 Gastro-esophageal reflux disease without esophagitis: Secondary | ICD-10-CM | POA: Insufficient documentation

## 2014-09-14 DIAGNOSIS — Z79899 Other long term (current) drug therapy: Secondary | ICD-10-CM | POA: Diagnosis not present

## 2014-09-14 DIAGNOSIS — Z87891 Personal history of nicotine dependence: Secondary | ICD-10-CM | POA: Diagnosis not present

## 2014-09-14 DIAGNOSIS — Z8701 Personal history of pneumonia (recurrent): Secondary | ICD-10-CM | POA: Diagnosis not present

## 2014-09-14 DIAGNOSIS — F419 Anxiety disorder, unspecified: Secondary | ICD-10-CM | POA: Insufficient documentation

## 2014-09-14 DIAGNOSIS — Z91040 Latex allergy status: Secondary | ICD-10-CM | POA: Insufficient documentation

## 2014-09-14 DIAGNOSIS — R079 Chest pain, unspecified: Secondary | ICD-10-CM | POA: Insufficient documentation

## 2014-09-14 DIAGNOSIS — R0602 Shortness of breath: Secondary | ICD-10-CM | POA: Diagnosis present

## 2014-09-14 DIAGNOSIS — F329 Major depressive disorder, single episode, unspecified: Secondary | ICD-10-CM | POA: Insufficient documentation

## 2014-09-14 DIAGNOSIS — Z8669 Personal history of other diseases of the nervous system and sense organs: Secondary | ICD-10-CM | POA: Diagnosis not present

## 2014-09-14 DIAGNOSIS — J45901 Unspecified asthma with (acute) exacerbation: Secondary | ICD-10-CM | POA: Insufficient documentation

## 2014-09-14 DIAGNOSIS — R531 Weakness: Secondary | ICD-10-CM | POA: Diagnosis not present

## 2014-09-14 DIAGNOSIS — Z7951 Long term (current) use of inhaled steroids: Secondary | ICD-10-CM | POA: Diagnosis not present

## 2014-09-14 DIAGNOSIS — R0789 Other chest pain: Secondary | ICD-10-CM

## 2014-09-14 DIAGNOSIS — M1990 Unspecified osteoarthritis, unspecified site: Secondary | ICD-10-CM | POA: Diagnosis not present

## 2014-09-14 DIAGNOSIS — I10 Essential (primary) hypertension: Secondary | ICD-10-CM | POA: Diagnosis not present

## 2014-09-14 LAB — I-STAT TROPONIN, ED: Troponin i, poc: 0 ng/mL (ref 0.00–0.08)

## 2014-09-14 LAB — CBC
HCT: 38.3 % (ref 36.0–46.0)
HEMOGLOBIN: 12.5 g/dL (ref 12.0–15.0)
MCH: 27 pg (ref 26.0–34.0)
MCHC: 32.6 g/dL (ref 30.0–36.0)
MCV: 82.7 fL (ref 78.0–100.0)
PLATELETS: 324 10*3/uL (ref 150–400)
RBC: 4.63 MIL/uL (ref 3.87–5.11)
RDW: 13.4 % (ref 11.5–15.5)
WBC: 7 10*3/uL (ref 4.0–10.5)

## 2014-09-14 LAB — BRAIN NATRIURETIC PEPTIDE: B NATRIURETIC PEPTIDE 5: 13 pg/mL (ref 0.0–100.0)

## 2014-09-14 NOTE — ED Notes (Signed)
Pt. Stated, my pacemaker started jumping and every time it did it caused me to be SOB and feel like I just want to lay down.

## 2014-09-14 NOTE — ED Notes (Signed)
MD at bedside. 

## 2014-09-14 NOTE — ED Provider Notes (Signed)
CSN: [id number]     Arrival date & time 09/14/14  1424 History   First MD Initiated Contact with Patient 09/14/14 1446     Chief Complaint  Patient presents with  . Shortness of Breath  . Weakness     HPI  Patient presents with concern of mild dyspnea, cough, ongoing palpitations, focally about the left upper chest, where she recently had a pacemaker placed. Several days ago the patient developed a cough, mild dyspnea. 2 days ago she saw her physician, was started on Levaquin, albuterol for bronchitis. Today, about 2 hours ago the patient started feeling palpitations, left upper chest quivering about her pacemaker site. No new fever, other chest pain, lightheadedness, syncope, nausea, vomiting. Symptoms improved with rest.   Past Medical History  Diagnosis Date  . GERD (gastroesophageal reflux disease)   . Arthritis   . Anxiety   . Fibromyalgia   . Bursitis     right hip  . Anal fissure 02/2012  . Hypertension     under control, has been on med. since 2010  . Tarsal tunnel syndrome of left side   . Complication of anesthesia     difficult to wake up post-op  . Depression   . Asthma     daily and prn inhalers  . Irritable bowel syndrome (IBS)   . TMJ (temporomandibular joint syndrome)     left  . Hyperlipidemia   . Pneumonia ~ 06/2012  . Chronic bronchitis     "get it about q year" (12/27/2012)  . Tarsal tunnel syndrome of left side   . Hepatitis A infection 1979  . 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 Shoulder arthroscopy w/ rotator cuff repair Left 1999  . Bilateral salpingoophorectomy Bilateral 04/2011  . Cystoscopy w/ ureteral stent placement  08/04/2009  . Laparoscopic lysis intestinal adhesions  08/04/2009  . Cardiac catheterization  10/08/2007  . Examination under anesthesia  02/17/2012    Procedure: EXAM UNDER ANESTHESIA;  Surgeon: Pedro Earls, MD;   Location: [place];  Service: General;  Laterality: N/A;  Exam under anesthesia with lateral internal sphincterotomy  . Sphincterotomy  02/17/2012    Procedure: SPHINCTEROTOMY;  Surgeon: Pedro Earls, MD;  Location: [place] [facility];  Service: General;  Laterality: N/A;  . Abdominal hysterectomy  09/1994    partial  . Appendectomy    . Cesarean section    . Small intestine surgery    . Pacemaker insertion  07/08/14    MDT MRI compatible dual chamber pacemaker implanted by Dr Rayann Heman for symptomatic bradycardia  . Permanent pacemaker insertion N/A 07/08/2014    Procedure: PERMANENT PACEMAKER INSERTION;  Surgeon: [name], MD;  Location: [facility] CATH LAB;  Service: Cardiovascular;  Laterality: N/A;   Family History  Problem Relation Age of Onset  . Cancer Mother     liver  . Diabetes Mother   . Hyperlipidemia Mother   . Hypertension Mother   . Cancer Maternal Grandmother     breast  . Anesthesia problems Sister     hard to wake up post-op  . Heart disease Father   . Diabetes Brother   . Hyperlipidemia Brother   . Cancer Maternal Grandfather     colon   History  Substance Use Topics  . Smoking status: Former Smoker -- 0.00 packs/day for 37 years    Types:  Cigarettes    Quit date: 06/05/2012  . Smokeless tobacco: Never Used  . Alcohol Use: No   OB History    No data available     Review of Systems  Constitutional:       Per HPI, otherwise negative  HENT:       Per HPI, otherwise negative  Respiratory:       Per HPI, otherwise negative  Cardiovascular:       Per HPI, otherwise negative  Gastrointestinal: Negative for vomiting.  Endocrine:       Negative aside from HPI  Genitourinary:       Neg aside from HPI   Musculoskeletal:       Per HPI, otherwise negative  Skin: Negative.   Neurological: Negative for syncope.      Allergies  Codeine; Propoxyphene n-acetaminophen; Sulfonamide derivatives; Alka-seltzer; Norvasc; Iohexol; and  Latex  Home Medications   Prior to Admission medications   Medication Sig Start Date End Date Taking? Authorizing Provider  albuterol (PROVENTIL HFA;VENTOLIN HFA) 108 (90 BASE) MCG/ACT inhaler Inhale 2 puffs into the lungs every 6 (six) hours as needed for wheezing. 08/15/13   [name] Janalee Dane, PA-C  ALPRAZolam (XANAX) 1 MG tablet TAKE 1 TABLET TWICE A DAY AS NEEDED FOR ANXIETY 09/02/14   [name] Janalee Dane, PA-C  aspirin 81 MG chewable tablet Chew 81 mg by mouth daily.    Historical Provider, MD  Azilsartan Medoxomil 40 MG TABS Take 20 mg by mouth daily.    Historical Provider, MD  beclomethasone (QVAR) 40 MCG/ACT inhaler Inhale 1-2 puffs into the lungs 2 (two) times daily. 03/26/14 03/27/15  Joretta Bachelor, PA  calcipotriene-betamethasone (TACLONEX) ointment Apply 1 application topically daily.    Historical Provider, MD  dexlansoprazole (DEXILANT) 60 MG capsule Take 60 mg by mouth daily as needed (for acid reflux).    Historical Provider, MD  diazepam (VALIUM) 5 MG tablet Take 1 tablet (5 mg total) by mouth daily. 07/18/13   [name] S Jeffery, PA-C  docusate sodium (COLACE) 100 MG capsule Take 1 capsule (100 mg total) by mouth daily as needed for mild constipation. 07/16/14   [name] S Jeffery, PA-C  escitalopram (LEXAPRO) 10 MG tablet Take 1 tablet (10 mg total) by mouth daily. 09/02/14   [name] S Jeffery, PA-C  fenofibrate 160 MG tablet Take 1 tablet (160 mg total) by mouth daily. 10/22/13   [name] S Jeffery, PA-C  fluticasone (FLONASE) 50 MCG/ACT nasal spray USE 2 SPRAYS INTO EACH NOSTRIL EVERY DAY    [name] S Jeffery, PA-C  furosemide (LASIX) 20 MG tablet Take 1 tablet (20 mg total) by mouth daily as needed (leg swelling). 10/22/13   [name] S Jeffery, PA-C  hydrOXYzine (ATARAX/VISTARIL) 25 MG tablet Take 0.5-2 tablets (12.5-50 mg total) by mouth at bedtime as needed for anxiety (insomnia). Patient not taking: Reported on 09/02/2014 07/18/13   [name] S Jeffery, PA-C  ipratropium (ATROVENT) 0.03 %  nasal spray USE 2 SPRAYS INTO THE NOSE EVERY 12 (TWELVE) HOURS.    Fara Chute, PA-C  meloxicam (MOBIC) 15 MG tablet  06/05/14   Historical Provider, MD  metaxalone (SKELAXIN) 800 MG tablet Take 400-800 mg by mouth daily as needed for pain (for muscle spasms).     Historical Provider, MD  montelukast (SINGULAIR) 10 MG tablet Take 1 tablet (10 mg total) by mouth daily. 09/02/14   [name] Janalee Dane, PA-C  Multiple Vitamin (MULTIVITAMIN) tablet Take 1 tablet by mouth daily.    Historical Provider,  MD  NITROSTAT 0.4 MG SL tablet Place 0.4 mg under the tongue every 5 (five) minutes as needed for chest pain. For chest pain. 11/24/11   Historical Provider, MD  OVER THE COUNTER MEDICATION Pre menopause tablets taking bid    Historical Provider, MD  oxyCODONE-acetaminophen (PERCOCET) 10-325 MG per tablet  09/01/14   Historical Provider, MD  oxyCODONE-acetaminophen (PERCOCET) 5-325 MG per tablet Take 1 tablet by mouth every 8 (eight) hours as needed for pain.     Historical Provider, MD  Probiotic Product (PROBIOTIC DAILY) CAPS Take 1 capsule by mouth 2 (two) times a week.    Historical Provider, MD  ranitidine (ZANTAC) 150 MG tablet Take 150 mg by mouth as needed for heartburn.     Historical Provider, MD  zolpidem (AMBIEN) 10 MG tablet Take 10 mg by mouth at bedtime as needed for sleep.  09/28/13   Historical Provider, MD   BP 111/58 mmHg  Pulse 64  Temp(Src) 98.1 F (36.7 C)  Resp 18  Ht 5' 7"  (1.702 m)  Wt 160 lb (72.576 kg)  BMI 25.05 kg/m2  SpO2 100% Physical Exam  Constitutional: She is oriented to person, place, and time. She appears well-developed and well-nourished. No distress.  HENT:  Head: Normocephalic and atraumatic.  Eyes: Conjunctivae and EOM are normal.  Cardiovascular: Normal rate and regular rhythm.   Pulmonary/Chest: Effort normal and breath sounds normal. No stridor. No respiratory distress.    Abdominal: She exhibits no distension.  Musculoskeletal: She exhibits no edema.   Neurological: She is alert and oriented to person, place, and time. No cranial nerve deficit.  Skin: Skin is warm and dry.  Psychiatric: She has a normal mood and affect.  Nursing note and vitals reviewed.   ED Course  Procedures (including critical care time) Labs Review Labs Reviewed  [place], ED    Imaging Review No results found.   EKG Interpretation   Date/Time:  Sunday September 14 2014 14:29:46 EDT Ventricular Rate:  64 PR Interval:  176 QRS Duration: 86 QT Interval:  384 QTC Calculation: 396 R Axis:   79 Text Interpretation:  Atrial-paced rhythm Nonspecific T wave abnormality  Abnormal ECG atrial-paced complexes T wave abnormality Abnormal ekg  Confirmed by Carmin Muskrat  MD [phone]) on 09/14/2014 2:45:35 PM     After the initial evaluation I reviewed the patient's chart, including notes from pacemaker placement earlier this year notes from fibromyalgia evaluation.  On monitor the patient has a heart rate in the 70s, paced, unremarkable Pulse oxygen 99% with room air normal   MDM   Patient presents after recent diagnosis of bronchus, now with palpitations, discomfort about her pacemaker site. Here the patient is awake, alert, afebrile, hemodynamically stable, in no distress. No evidence for pneumonia, substantial leftward abnormalities. No evidence for occult infection. Patient had no new symptoms here, was discharged in stable condition to follow-up with her primary care physician, electrophysiologist.       Carmin Muskrat, MD 09/14/14 [phone]

## 2014-09-14 NOTE — Discharge Instructions (Signed)
As discussed, your evaluation today has been largely reassuring.  But, it is important that you monitor your condition carefully, and do not hesitate to return to the ED if you develop new, or concerning changes in your condition. ? ?Otherwise, please follow-up with your physician for appropriate ongoing care. ? ?

## 2014-09-16 ENCOUNTER — Telehealth: Payer: Self-pay | Admitting: Internal Medicine

## 2014-09-16 NOTE — Telephone Encounter (Signed)
New message      Pt has had problems with her pacemaker.  She went to the ER on 09-14-14.  She want the doctor to know.  OK to call tomorrow.

## 2014-09-17 NOTE — Telephone Encounter (Signed)
Spoke w/pt in regards to ER visit. Pt having palpitations Sunday am. Pt was prescribed Albuterol for wheezing. Pt was told by ER dr that Albuterol could cause palpitations. Agreed and pt is only going to use Albuterol when absolutely needed. Pt is scheduled for appt on 10-13-14.

## 2014-09-18 ENCOUNTER — Ambulatory Visit: Payer: 59 | Admitting: Psychiatry

## 2014-09-19 ENCOUNTER — Encounter: Payer: Self-pay | Admitting: Physician Assistant

## 2014-09-19 DIAGNOSIS — E119 Type 2 diabetes mellitus without complications: Secondary | ICD-10-CM

## 2014-10-02 ENCOUNTER — Encounter: Payer: Self-pay | Admitting: Physician Assistant

## 2014-10-06 ENCOUNTER — Other Ambulatory Visit: Payer: Self-pay

## 2014-10-08 ENCOUNTER — Ambulatory Visit (INDEPENDENT_AMBULATORY_CARE_PROVIDER_SITE_OTHER): Payer: 59 | Admitting: Psychiatry

## 2014-10-08 DIAGNOSIS — F331 Major depressive disorder, recurrent, moderate: Secondary | ICD-10-CM

## 2014-10-11 ENCOUNTER — Telehealth: Payer: Medicare Other | Admitting: Family

## 2014-10-11 DIAGNOSIS — J0120 Acute ethmoidal sinusitis, unspecified: Secondary | ICD-10-CM

## 2014-10-11 MED ORDER — AMOXICILLIN-POT CLAVULANATE 875-125 MG PO TABS: 1.0000 | ORAL_TABLET | Freq: Two times a day (BID) | ORAL | Status: DC

## 2014-10-11 NOTE — Progress Notes (Signed)
We are sorry that you are not feeling well.  Here is how we plan to help!  Based on what you have shared with me it looks like you have sinusitis.  Sinusitis is inflammation and infection in the sinus cavities of the head.  Based on your presentation I believe you most likely have Acute Bacterial sinusitis.  This is an infection caused by bacteria and is treated with antibiotics.  I have prescribed Augmentin, an antibiotic in the penicillin family, one tablet twice daily with food, for 7 days.. You may use an oral decongestant such as Mucinex D or if you have glaucoma or high blood pressure use plain Mucinex.  Saline nasal sprays help and can safely be used as often as needed for congestion. If you develop worsening sinus pain, fever or notice severe headache and vision changes, or if symptoms are not better after completion of antibiotic, please schedule an appointment with a health care provider.  Sinus infections are not as easily transmitted as other respiratory infection, however we still recommend that you avoid close contact with loved ones, especially the very young and elderly.  Remember to wash your hands thoroughly throughout the day as this is the number one way to prevent the spread of infection!  Home Care:  Only take medications as instructed by your medical team.  Complete the entire course of an antibiotic.  Do not take these medications with alcohol.  A steam or ultrasonic humidifier can help congestion.  You can place a towel over your head and breathe in the steam from hot water coming from a faucet.  Avoid close contacts especially the very young and the elderly.  Cover your mouth when you cough or sneeze.  Always remember to wash your hands.  Get Help Right Away If:  You develop worsening fever or sinus pain.  You develop a severe head ache or visual changes.  Your symptoms persist after you have completed your treatment plan.  Make sure you  Understand these  instructions.  Will watch your condition.  Will get help right away if you are not doing well or get worse.  Your e-visit answers were reviewed by a board certified advanced clinical practitioner to complete your personal care plan.  Depending on the condition, your plan could have included both over the counter or prescription medications.  If there is a problem please reply  once you have received a response from your provider.  Your safety is important to [place].  If you have drug allergies check your prescription carefully.    You can use MyChart to ask questions about today's visit, request a non-urgent call back, or ask for a work or school excuse.  You will get an e-mail in the next two days asking about your experience.  I hope that your e-visit has been valuable and will speed your recovery. Thank you for using e-visits.

## 2014-10-13 ENCOUNTER — Other Ambulatory Visit: Payer: Self-pay

## 2014-10-13 ENCOUNTER — Encounter: Payer: Medicare Other | Admitting: Internal Medicine

## 2014-10-17 ENCOUNTER — Ambulatory Visit: Payer: Self-pay | Admitting: *Deleted

## 2014-10-23 ENCOUNTER — Other Ambulatory Visit: Payer: Self-pay | Admitting: Physician Assistant

## 2014-10-28 ENCOUNTER — Encounter: Payer: Medicare Other | Attending: Physician Assistant | Admitting: *Deleted

## 2014-10-28 ENCOUNTER — Encounter: Payer: Self-pay | Admitting: *Deleted

## 2014-10-28 VITALS — Ht 67.0 in | Wt 157.9 lb

## 2014-10-28 DIAGNOSIS — E119 Type 2 diabetes mellitus without complications: Secondary | ICD-10-CM | POA: Insufficient documentation

## 2014-10-28 DIAGNOSIS — Z713 Dietary counseling and surveillance: Secondary | ICD-10-CM | POA: Insufficient documentation

## 2014-10-28 NOTE — Patient Instructions (Addendum)
.  Plan:  Aim for 2-3 Carb Choices per meal (30-45 grams) +/- 1 either way  Aim for 0-15 Carbs per snack if hungry  Include protein in moderation with your meals and snacks Consider reading food labels for Total Carbohydrate and Fat Grams of foods Consider  increasing your activity level by walking for 30 minutes daily as tolerated Consider taking medication as directed by MD  Breakfast: 1C Cheerios 1/2 C milk, egg = perfect                   1C Oatmeal, 1/2 banana, egg = OK  Snack: add peanut butter to [employer]  Nani Gasser whole wheat Reduced calorie bread 45 calorie ( 9g carbs per slice) [place] sandwich & hand full of chips  Dannon Fit & Light GREEK Yogurt Yoplait 100 Greek Yogurt

## 2014-11-05 NOTE — Progress Notes (Signed)
Diabetes Self-Management Education  Visit Type: First/Initial (08/2014)  Appt. Start Time: 1400  Appt. End Time: 2094  11/05/2014  [name], identified by name and date of birth, is a 59 y.o. female with a diagnosis of Diabetes: Type 2.  Other people present during visit:  Patient   ASSESSMENT  Height 5' 7"  (1.702 m), weight 157 lb 14.4 oz (71.623 kg). Body mass index is 24.72 kg/(m^2).  Initial Visit Information:  Are you currently following a meal plan?: No Are you taking your medications as prescribed?: Yes Are you checking your feet?: No How often do you need to have someone help you when you read instructions, pamphlets, or other written materials from your doctor or pharmacy?: 1 - Never   Psychosocial:   Patient Belief/Attitude about Diabetes: Motivated to manage diabetes Self-care barriers: None Self-management support: CDE visits Other persons present: Patient Patient Concerns: Nutrition/Meal planning, Healthy Lifestyle Special Needs: None Preferred Learning Style: No preference indicated Learning Readiness: Change in progress  Complications:   Last HgB A1C per patient/outside source: 6.3 mg/dL How often do you check your blood sugar?: Not recommended by provider Have you had a dilated eye exam in the past 12 months?: Yes Have you had a dental exam in the past 12 months?: Yes  Diet Intake:  Breakfast: 1 1/2 C cereal (rice chex, cheerios) banana, milk 2% Snack (morning): 3-5 graham crackers, water Lunch: Smoothie, vegetables, fruit ,   Snack (afternoon): hamburger (mcdonalds),1/2 Protein bar Dinner: baked chicken, rice , vegetables Snack (evening): sugar free ice cream Beverage(s): smoothie, water,  Exercise:  Exercise: Light (walking / raking leaves) Light Exercise amount of time (min / week): 60  Individualized Plan for Diabetes Self-Management Training:   Learning Objective:  Patient will have a greater understanding of diabetes  self-management. Patient education plan per assessed needs and concerns is to attend individual sessions      Education Topics Reviewed with Patient Today:  Definition of diabetes, type 1 and 2, and the diagnosis of diabetes Role of diet in the treatment of diabetes and the relationship between the three main macronutrients and blood glucose level, Food label reading, portion sizes and measuring food., Carbohydrate counting, Meal options for control of blood glucose level and chronic complications. Role of exercise on diabetes management, blood pressure control and cardiac health., Helped patient identify appropriate exercises in relation to his/her diabetes, diabetes complications and other health issue. Daily foot exams, Yearly dilated eye exam Relationship between chronic complications and blood glucose control, Dental care Role of stress on diabetes    PATIENTS GOALS/Plan (Developed by the patient):  Nutrition: General guidelines for healthy choices and portions discussed Physical Activity: Exercise 3-5 times per week, 30 minutes per day Medications: take my medication as prescribed Reducing Risk: do foot checks daily  Patient Instructions  .Plan:  Aim for 2-3 Carb Choices per meal (30-45 grams) +/- 1 either way  Aim for 0-15 Carbs per snack if hungry  Include protein in moderation with your meals and snacks Consider reading food labels for Total Carbohydrate and Fat Grams of foods Consider  increasing your activity level by walking for 30 minutes daily as tolerated Consider taking medication as directed by MD  Breakfast: 1C Cheerios 1/2 C milk, egg = perfect                   1C Oatmeal, 1/2 banana, egg = OK  Snack: add peanut butter to [employer]  [name] whole wheat Reduced calorie  bread 45 calorie ( 9g carbs per slice) [place] sandwich & hand full of chips  [employer] GREEK Yogurt Yoplait 100 Greek Yogurt    Expected Outcomes:  Demonstrated interest in  learning. Expect positive outcomes  Education material provided: Living Well with Diabetes, A1C conversion sheet, Meal plan card, My Plate and Snack sheet  If problems or questions, patient to contact team via:  Phone  Future DSME appointment: PRN

## 2014-11-06 ENCOUNTER — Ambulatory Visit (INDEPENDENT_AMBULATORY_CARE_PROVIDER_SITE_OTHER): Payer: Medicare Other | Admitting: Family Medicine

## 2014-11-06 ENCOUNTER — Ambulatory Visit (INDEPENDENT_AMBULATORY_CARE_PROVIDER_SITE_OTHER): Payer: Medicare Other

## 2014-11-06 VITALS — BP 122/84 | HR 89 | Temp 98.1°F | Resp 18 | Ht 68.0 in | Wt 158.0 lb

## 2014-11-06 DIAGNOSIS — S9031XA Contusion of right foot, initial encounter: Secondary | ICD-10-CM | POA: Diagnosis not present

## 2014-11-06 DIAGNOSIS — M79671 Pain in right foot: Secondary | ICD-10-CM

## 2014-11-06 NOTE — Progress Notes (Addendum)
Subjective:  This chart was scribed for Alicia Ray, MD by Moises Blood, Medical Scribe. This patient was seen in room 10 and the patient's care was started 8:29 AM.    Patient ID: [name], female    DOB: 09/26/1955, 59 y.o.   MRN: [id number]  HPI [name] is a 59 y.o. female  She's here for right foot injury.  She has a history of multiple medical problems and medications per review of chart.  Patient complains of sudden onset right foot pain. She states that chairs fell down on her right foot yesterday when at a store and fell onto top of her right foot. She also reports the pain radiating up to the knee and toward hip this morning. She also mentions that she has no feeling in the big toe due to a can of soup falling on it before this incident. She does also note that the pains up the leg or to the hip have been there in the past as well with her chronic pain and fibromyalgia.   Treated with ice yestarday and "piece of pain medicine" this morning.      Patient Active Problem List   Diagnosis Date Noted  . 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 Sick sinus syndrome 07/08/2014  . Chest pain at rest 07/05/2014  . Chronic rhinitis 10/22/2013  . Fatty liver 10/22/2013  . Interstitial cystitis 10/22/2013  . Hip pain, right 10/22/2013  . Chronic insomnia 09/25/2012  . HTN (hypertension) 09/25/2012  . Hyperlipidemia 09/25/2012  . Diabetes mellitus type 2, controlled, without complications 43/32/9518  . GERD 12/04/2008  . PERSONAL HX COLONIC POLYPS 12/04/2008  . CONSTIPATION 10/21/2008  . Fibromyalgia 10/21/2008  . LACTOSE INTOLERANCE 11/20/2007  . DIVERTICULITIS, ACUTE 11/20/2007  . CHEST PAIN 11/20/2007  . Anxiety and depression 08/02/2007  . Osteoporosis 08/02/2007  . DIVERTICULOSIS, COLON 11/16/2006  . EXTERNAL HEMORRHOIDS 05/12/2005   Past Medical History  Diagnosis Date  . GERD (gastroesophageal reflux disease)   . Arthritis   . Anxiety   . Fibromyalgia   .  Bursitis     right hip  . Anal fissure 02/2012  . Hypertension     under control, has been on med. since 2010  . Tarsal tunnel syndrome of left side   . Complication of anesthesia     difficult to wake up post-op  . Depression   . Asthma     daily and prn inhalers  . Irritable bowel syndrome (IBS)   . TMJ (temporomandibular joint syndrome)     left  . Hyperlipidemia   . Pneumonia ~ 06/2012  . Chronic bronchitis     "get it about q year" (12/27/2012)  . Tarsal tunnel syndrome of left side   . Hepatitis A infection 1979  . 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 Shoulder arthroscopy w/ rotator cuff repair Left 1999  . Bilateral salpingoophorectomy Bilateral 04/2011  . Cystoscopy w/ ureteral stent placement  08/04/2009  . Laparoscopic lysis intestinal adhesions  08/04/2009  . Cardiac catheterization  10/08/2007  . Examination under anesthesia  02/17/2012    Procedure: EXAM UNDER ANESTHESIA;  Surgeon: Pedro Earls, MD;  Location: [facility];  Service: General;  Laterality: N/A;  Exam under anesthesia with lateral internal sphincterotomy  . Sphincterotomy  02/17/2012    Procedure: SPHINCTEROTOMY;  Surgeon: Pedro Earls, MD;  Location: [facility];  Service: General;  Laterality: N/A;  . Abdominal hysterectomy  09/1994    partial  . Appendectomy    . Cesarean section    . Small intestine surgery    . Pacemaker insertion  07/08/14    MDT MRI compatible dual chamber pacemaker implanted by Dr Rayann Heman for symptomatic bradycardia  . Permanent pacemaker insertion N/A 07/08/2014    Procedure: PERMANENT PACEMAKER INSERTION;  Surgeon: [name], MD;  Location: [facility] CATH LAB;  Service: Cardiovascular;  Laterality: N/A;   Allergies  Allergen Reactions  . Codeine Other (See Comments)    HALLUCINATIONS   . Propoxyphene N-Acetaminophen Other (See Comments)     HALLUCINATIONS  . Sulfonamide Derivatives Hives, Itching and Swelling  . Alka-Seltzer [Aspirin Effervescent] Other (See Comments)    "like she's fading away"  . Norvasc [Amlodipine Besylate]   . Iohexol Nausea Only    JITTERY    . Latex Hives and Itching   Prior to Admission medications   Medication Sig Start Date End Date Taking? Authorizing Provider  albuterol (PROVENTIL HFA;VENTOLIN HFA) 108 (90 BASE) MCG/ACT inhaler Inhale 2 puffs into the lungs every 6 (six) hours as needed for wheezing. 08/15/13  Yes [name] Jeffery, PA-C  ALPRAZolam (XANAX) 1 MG tablet TAKE 1 TABLET TWICE A DAY AS NEEDED FOR ANXIETY 09/02/14  Yes [name] Jeffery, PA-C  aspirin 81 MG chewable tablet Chew 81 mg by mouth daily.   Yes Historical Provider, MD  Azilsartan Medoxomil 40 MG TABS Take 20 mg by mouth daily.   Yes Historical Provider, MD  beclomethasone (QVAR) 40 MCG/ACT inhaler Inhale 1-2 puffs into the lungs 2 (two) times daily. 03/26/14 03/27/15 Yes Stephanie D English, PA  calcipotriene-betamethasone (TACLONEX) ointment Apply 1 application topically daily.   Yes Historical Provider, MD  Desoximetasone (TOPICORT) 0.25 % ointment  07/13/14  Yes Historical Provider, MD  dexlansoprazole (DEXILANT) 60 MG capsule Take 60 mg by mouth daily as needed (for acid reflux).   Yes Historical Provider, MD  diazepam (VALIUM) 5 MG tablet Take 1 tablet (5 mg total) by mouth daily. 07/18/13  Yes [name] Jeffery, PA-C  docusate sodium (COLACE) 100 MG capsule TAKE 1 CAPSULE (100 MG TOTAL) BY MOUTH DAILY AS NEEDED FOR MILD CONSTIPATION. 10/23/14  Yes [name] Jeffery, PA-C  escitalopram (LEXAPRO) 10 MG tablet Take 1 tablet (10 mg total) by mouth daily. 09/02/14  Yes [name] Jeffery, PA-C  fenofibrate 160 MG tablet Take 1 tablet (160 mg total) by mouth daily. 10/22/13  Yes [name] Jeffery, PA-C  fluticasone (FLONASE) 50 MCG/ACT nasal spray USE 2 SPRAYS INTO EACH NOSTRIL EVERY DAY   Yes [name] Jeffery, PA-C  furosemide (LASIX) 20 MG tablet Take 1  tablet (20 mg total) by mouth daily as needed (leg swelling). 10/22/13  Yes [name] Jeffery, PA-C  hydrOXYzine (ATARAX/VISTARIL) 25 MG tablet Take 0.5-2 tablets (12.5-50 mg total) by mouth at bedtime as needed for anxiety (insomnia). 07/18/13  Yes [name] Jeffery, PA-C  ipratropium (ATROVENT) 0.03 % nasal spray USE 2 SPRAYS INTO THE NOSE EVERY 12 (TWELVE) HOURS.   Yes [name] Jeffery, PA-C  meloxicam (MOBIC) 15 MG tablet  06/05/14  Yes Historical Provider, MD  metaxalone (SKELAXIN) 800 MG tablet Take 400-800 mg by mouth daily as needed for pain (for muscle spasms).    Yes Historical Provider, MD  montelukast (SINGULAIR) 10 MG tablet Take 1 tablet (10 mg total) by mouth daily. 09/02/14  Yes [name] Jeffery, PA-C  Multiple Vitamin (MULTIVITAMIN) tablet Take 1 tablet by  mouth daily.   Yes Historical Provider, MD  NITROSTAT 0.4 MG SL tablet Place 0.4 mg under the tongue every 5 (five) minutes as needed for chest pain. For chest pain. 11/24/11  Yes Historical Provider, MD  OVER THE COUNTER MEDICATION Pre menopause tablets taking bid   Yes Historical Provider, MD  oxyCODONE-acetaminophen (PERCOCET) 10-325 MG per tablet Take 1 tablet by mouth every 4 (four) hours as needed for pain.  09/01/14  Yes Historical Provider, MD  Probiotic Product (PROBIOTIC DAILY) CAPS Take 1 capsule by mouth 2 (two) times a week.   Yes Historical Provider, MD  ranitidine (ZANTAC) 150 MG tablet Take 150 mg by mouth as needed for heartburn.    Yes Historical Provider, MD  zolpidem (AMBIEN) 10 MG tablet Take 10 mg by mouth at bedtime as needed for sleep.  09/28/13  Yes Historical Provider, MD  amoxicillin-clavulanate (AUGMENTIN) 875-125 MG per tablet Take 1 tablet by mouth 2 (two) times daily. Patient not taking: Reported on 11/06/2014 10/11/14   Benjamine Mola, FNP  escitalopram (LEXAPRO) 5 MG tablet Take 5 mg by mouth daily. 08/25/14   Historical Provider, MD  oxyCODONE-acetaminophen (PERCOCET) 5-325 MG per tablet Take 1 tablet by mouth every 8  (eight) hours as needed for pain.     Historical Provider, MD   History   Social History  . Marital Status: Divorced    Spouse Name: n/a  . Number of Children: 1  . Years of Education: 17   Occupational History  . Disabled     Fibromyalgia, Depression, Anxiety   Social History Main Topics  . Smoking status: Former Smoker -- 0.00 packs/day for 37 years    Types: Cigarettes    Quit date: 06/05/2012  . Smokeless tobacco: Never Used  . Alcohol Use: No  . Drug Use: No  . Sexual Activity: Not Currently    Birth Control/ Protection: Post-menopausal   Other Topics Concern  . Not on file   Social History Narrative   Lives alone.  Daughter and granddaughter live in [place], [place]. Pt exercise sometimes.              Review of Systems  Musculoskeletal: Positive for arthralgias.  Skin: Positive for color change (foot. ) and wound. Negative for rash.       Objective:   Physical Exam  Constitutional: She is oriented to person, place, and time. She appears well-developed and well-nourished. No distress.  HENT:  Head: Normocephalic and atraumatic.  Eyes: EOM are normal. Pupils are equal, round, and reactive to light.  Neck: Neck supple.  Cardiovascular: Normal rate.   Pulmonary/Chest: Effort normal. No respiratory distress.  Musculoskeletal: Normal range of motion.  No malleolar tenderness ttp over dorsum of right foot, skin intact, min erythema over dorsum of foot.  Navicula and 5th metatarsal are non tender   right knee full ROM, with no effusion in right knee; calf non tender   full ROM of right ankle and foot.   Neurological: She is alert and oriented to person, place, and time.  Skin: Skin is warm and dry.  Psychiatric: She has a normal mood and affect. Her behavior is normal.  Nursing note and vitals reviewed.   Filed Vitals:   11/06/14 0807  BP: 122/84  Pulse: 89  Temp: 98.1 F (36.7 C)  TempSrc: Oral  Resp: 18  Height: 5' 8"  (1.727 m)  Weight: 158 lb  (71.668 kg)  SpO2: 99%   [facility] reading (PRIMARY) by  Dr. Carlota Raspberry:  R foot: no fracture or acute findings identified.      Assessment & Plan:  [name] is a 59 y.o. female Right foot pain - Plan: DG Foot Complete Right  Contusion of right foot, initial encounter - Plan: DG Foot Complete Right  No fracture identified. No other visible injuries beside small amt of erythema and ttp on dorsum of foot.  Hx of fibromyalgia, that may be flaring causing other pains.   -ice, elevation, sx care as discussed in AVS.   -on chronic pain meds - has these if needed for foot pain  -follow up as scheduled with [name] in 1 week, but if worsening - return sooner.   No orders of the defined types were placed in this encounter.   Patient Instructions  I did not see a broken bone in your foot. This appears to be a contusion or bruise of the area. You can continue ice on and off the area in 15 minute intervals if needed today and tomorrow, elevate your foot when seated, take your pain medication only if needed, and see other information below. If not improving in next 10 days - follow up with myself or [name], PA-C.   Return to the clinic or go to the nearest emergency room if any of your symptoms worsen or new symptoms occur.  Foot Contusion A foot contusion is a deep bruise to the foot. Contusions are the result of an injury that caused bleeding under the skin. The contusion may turn blue, purple, or yellow. Minor injuries will give you a painless contusion, but more severe contusions may stay painful and swollen for a few weeks. CAUSES  A foot contusion comes from a direct blow to that area, such as a heavy object falling on the foot. SYMPTOMS   Swelling of the foot.  Discoloration of the foot.  Tenderness or soreness of the foot. DIAGNOSIS  You will have a physical exam and will be asked about your history. You may need an X-Grant of your foot to look for a broken bone (fracture).  TREATMENT    An elastic wrap may be recommended to support your foot. Resting, elevating, and applying cold compresses to your foot are often the best treatments for a foot contusion. Over-the-counter medicines may also be recommended for pain control. HOME CARE INSTRUCTIONS   Put ice on the injured area.  Put ice in a plastic bag.  Place a towel between your skin and the bag.  Leave the ice on for 15-20 minutes, 03-04 times a day.  Only take over-the-counter or prescription medicines for pain, discomfort, or fever as directed by your caregiver.  If told, use an elastic wrap as directed. This can help reduce swelling. You may remove the wrap for sleeping, showering, and bathing. If your toes become numb, cold, or blue, take the wrap off and reapply it more loosely.  Elevate your foot with pillows to reduce swelling.  Try to avoid standing or walking while the foot is painful. Do not resume use until instructed by your caregiver. Then, begin use gradually. If pain develops, decrease use. Gradually increase activities that do not cause discomfort until you have normal use of your foot.  See your caregiver as directed. It is very important to keep all follow-up appointments in order to avoid any lasting problems with your foot, including long-term (chronic) pain. SEEK IMMEDIATE MEDICAL CARE IF:   You have increased redness, swelling, or pain in your foot.  Your swelling  or pain is not relieved with medicines.  You have loss of feeling in your foot or are unable to move your toes.  Your foot turns cold or blue.  You have pain when you move your toes.  Your foot becomes warm to the touch.  Your contusion does not improve in 2 days. MAKE SURE YOU:   Understand these instructions.  Will watch your condition.  Will get help right away if you are not doing well or get worse. Document Released: 03/14/2006 Document Revised: 11/22/2011 Document Reviewed: 04/26/2011 [facility] Patient Information  2015 [place], [place]. This information is not intended to replace advice given to you by your health care provider. Make sure you discuss any questions you have with your health care provider.         I personally performed the services described in this documentation, which was scribed in my presence. The recorded information has been reviewed and considered, and addended by me as needed.

## 2014-11-06 NOTE — Patient Instructions (Signed)
I did not see a broken bone in your foot. This appears to be a contusion or bruise of the area. You can continue ice on and off the area in 15 minute intervals if needed today and tomorrow, elevate your foot when seated, take your pain medication only if needed, and see other information below. If not improving in next 10 days - follow up with myself or [name], PA-C.   Return to the clinic or go to the nearest emergency room if any of your symptoms worsen or new symptoms occur.  Foot Contusion A foot contusion is a deep bruise to the foot. Contusions are the result of an injury that caused bleeding under the skin. The contusion may turn blue, purple, or yellow. Minor injuries will give you a painless contusion, but more severe contusions may stay painful and swollen for a few weeks. CAUSES  A foot contusion comes from a direct blow to that area, such as a heavy object falling on the foot. SYMPTOMS   Swelling of the foot.  Discoloration of the foot.  Tenderness or soreness of the foot. DIAGNOSIS  You will have a physical exam and will be asked about your history. You may need an X-ray of your foot to look for a broken bone (fracture).  TREATMENT  An elastic wrap may be recommended to support your foot. Resting, elevating, and applying cold compresses to your foot are often the best treatments for a foot contusion. Over-the-counter medicines may also be recommended for pain control. HOME CARE INSTRUCTIONS   Put ice on the injured area.  Put ice in a plastic bag.  Place a towel between your skin and the bag.  Leave the ice on for 15-20 minutes, 03-04 times a day.  Only take over-the-counter or prescription medicines for pain, discomfort, or fever as directed by your caregiver.  If told, use an elastic wrap as directed. This can help reduce swelling. You may remove the wrap for sleeping, showering, and bathing. If your toes become numb, cold, or blue, take the wrap off and reapply  it more loosely.  Elevate your foot with pillows to reduce swelling.  Try to avoid standing or walking while the foot is painful. Do not resume use until instructed by your caregiver. Then, begin use gradually. If pain develops, decrease use. Gradually increase activities that do not cause discomfort until you have normal use of your foot.  See your caregiver as directed. It is very important to keep all follow-up appointments in order to avoid any lasting problems with your foot, including long-term (chronic) pain. SEEK IMMEDIATE MEDICAL CARE IF:   You have increased redness, swelling, or pain in your foot.  Your swelling or pain is not relieved with medicines.  You have loss of feeling in your foot or are unable to move your toes.  Your foot turns cold or blue.  You have pain when you move your toes.  Your foot becomes warm to the touch.  Your contusion does not improve in 2 days. MAKE SURE YOU:   Understand these instructions.  Will watch your condition.  Will get help right away if you are not doing well or get worse. Document Released: 03/14/2006 Document Revised: 11/22/2011 Document Reviewed: 04/26/2011 [facility] Patient Information 2015 [place], [place]. This information is not intended to replace advice given to you by your health care provider. Make sure you discuss any questions you have with your health care provider.

## 2014-11-12 ENCOUNTER — Encounter: Payer: Self-pay | Admitting: Internal Medicine

## 2014-11-12 ENCOUNTER — Ambulatory Visit (INDEPENDENT_AMBULATORY_CARE_PROVIDER_SITE_OTHER): Payer: Medicare Other | Admitting: Internal Medicine

## 2014-11-12 VITALS — BP 94/68 | HR 61 | Ht 68.0 in | Wt 155.0 lb

## 2014-11-12 DIAGNOSIS — I495 Sick sinus syndrome: Secondary | ICD-10-CM

## 2014-11-12 DIAGNOSIS — I10 Essential (primary) hypertension: Secondary | ICD-10-CM | POA: Diagnosis not present

## 2014-11-12 LAB — CUP PACEART INCLINIC DEVICE CHECK
Brady Statistic AP VP Percent: 0.07 %
Brady Statistic AP VS Percent: 93.92 %
Brady Statistic RA Percent Paced: 93.98 %
Brady Statistic RV Percent Paced: 0.07 %
Date Time Interrogation Session: 20160608165606
Lead Channel Impedance Value: 551 Ohm
Lead Channel Impedance Value: 589 Ohm
Lead Channel Pacing Threshold Pulse Width: 0.4 ms
Lead Channel Sensing Intrinsic Amplitude: 31.625 mV
Lead Channel Sensing Intrinsic Amplitude: 4.5 mV
Lead Channel Setting Pacing Amplitude: 2 V
Lead Channel Setting Sensing Sensitivity: 2.8 mV
MDC IDC MSMT BATTERY REMAINING LONGEVITY: 113 mo
MDC IDC MSMT BATTERY VOLTAGE: 3.04 V
MDC IDC MSMT LEADCHNL RA IMPEDANCE VALUE: 437 Ohm
MDC IDC MSMT LEADCHNL RA PACING THRESHOLD AMPLITUDE: 0.5 V
MDC IDC MSMT LEADCHNL RV IMPEDANCE VALUE: 475 Ohm
MDC IDC MSMT LEADCHNL RV PACING THRESHOLD AMPLITUDE: 1 V
MDC IDC MSMT LEADCHNL RV PACING THRESHOLD PULSEWIDTH: 0.4 ms
MDC IDC SET LEADCHNL RV PACING AMPLITUDE: 2.5 V
MDC IDC SET LEADCHNL RV PACING PULSEWIDTH: 0.4 ms
MDC IDC STAT BRADY AS VP PERCENT: 0 %
MDC IDC STAT BRADY AS VS PERCENT: 6.01 %
Zone Setting Detection Interval: 400 ms
Zone Setting Detection Interval: 400 ms

## 2014-11-12 NOTE — Patient Instructions (Addendum)
Medication Instructions:  1) Stop Edarbe.(Aazilsartan Medoxomil)   Labwork: None ordered  Testing/Procedures: None ordered  Follow-Up: Your physician wants you to follow-up in: 12 months with Chanetta Marshall, NP You will receive a reminder letter in the mail two months in advance. If you don't receive a letter, please call our office to schedule the follow-up appointment.   Remote monitoring is used to monitor your Pacemaker or ICD from home. This monitoring reduces the number of office visits required to check your device to one time per year. It allows [place] to keep an eye on the functioning of your device to ensure it is working properly. You are scheduled for a device check from home on 02/11/15. You may send your transmission at any time that day. If you have a wireless device, the transmission will be sent automatically. After your physician reviews your transmission, you will receive a postcard with your next transmission date.     Any Other Special Instructions Will Be Listed Below (If Applicable).

## 2014-11-12 NOTE — Progress Notes (Signed)
PCP: JEFFERY,[name], PA-C Primary Cardiologist:  Dr Joylene Igo is a 59 y.o. female who presents today for routine electrophysiology followup.  Since her PPM implant, the patient reports doing reasonably well.  He has had dizziness and fatigue with recently started edarbe.  Today, she denies symptoms of palpitations, chest pain, shortness of breath,  lower extremity edema, dizziness, presyncope, or syncope.  The patient is otherwise without complaint today.   Past Medical History  Diagnosis Date  . GERD (gastroesophageal reflux disease)   . Arthritis   . Anxiety   . Fibromyalgia   . Bursitis     right hip  . Anal fissure 02/2012  . Hypertension     under control, has been on med. since 2010  . Tarsal tunnel syndrome of left side   . Complication of anesthesia     difficult to wake up post-op  . Depression   . Asthma     daily and prn inhalers  . Irritable bowel syndrome (IBS)   . TMJ (temporomandibular joint syndrome)     left  . Hyperlipidemia   . Pneumonia ~ 06/2012  . Chronic bronchitis     "get it about q year" (12/27/2012)  . Tarsal tunnel syndrome of left side   . Hepatitis A infection 1979  . 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 Shoulder arthroscopy w/ rotator cuff repair Left 1999  . Bilateral salpingoophorectomy Bilateral 04/2011  . Cystoscopy w/ ureteral stent placement  08/04/2009  . Laparoscopic lysis intestinal adhesions  08/04/2009  . Cardiac catheterization  10/08/2007  . Examination under anesthesia  02/17/2012    Procedure: EXAM UNDER ANESTHESIA;  Surgeon: Pedro Earls, MD;  Location: [facility];  Service: General;  Laterality: N/A;  Exam under anesthesia with lateral internal sphincterotomy  . Sphincterotomy  02/17/2012    Procedure: SPHINCTEROTOMY;  Surgeon: Pedro Earls, MD;  Location: [facility];  Service:  General;  Laterality: N/A;  . Abdominal hysterectomy  09/1994    partial  . Appendectomy    . Cesarean section    . Small intestine surgery    . Pacemaker insertion  07/08/14    MDT MRI compatible dual chamber pacemaker implanted by Dr Rayann Heman for symptomatic bradycardia  . Permanent pacemaker insertion N/A 07/08/2014    Procedure: PERMANENT PACEMAKER INSERTION;  Surgeon: [name], MD;  Location: [facility];  Service: Cardiovascular;  Laterality: N/A;    ROS- all systems are reviewed and negative except as per HPI above  Current Outpatient Prescriptions  Medication Sig Dispense Refill  . albuterol (PROVENTIL HFA;VENTOLIN HFA) 108 (90 BASE) MCG/ACT inhaler Inhale 2 puffs into the lungs every 6 (six) hours as needed for wheezing. 18 g 2  . ALPRAZolam (XANAX) 1 MG tablet TAKE 1 TABLET TWICE A DAY AS NEEDED FOR ANXIETY 60 tablet 0  . aspirin 81 MG chewable tablet Chew 81 mg by mouth daily.    . beclomethasone (QVAR) 40 MCG/ACT inhaler Inhale 1-2 puffs into the lungs 2 (two) times daily. 1 Inhaler 12  . calcipotriene-betamethasone (TACLONEX) ointment Apply 1 application topically daily.    . Desoximetasone (TOPICORT) 0.25 % ointment   6  . dexlansoprazole (DEXILANT) 60 MG capsule Take 60 mg by mouth daily as needed (for acid reflux).    . diazepam (VALIUM) 5 MG tablet Take 1 tablet (5  mg total) by mouth daily. 30 tablet 0  . docusate sodium (COLACE) 100 MG capsule TAKE 1 CAPSULE (100 MG TOTAL) BY MOUTH DAILY AS NEEDED FOR MILD CONSTIPATION. 30 capsule 0  . escitalopram (LEXAPRO) 5 MG tablet Take 5 mg by mouth daily.  6  . fenofibrate 160 MG tablet Take 1 tablet (160 mg total) by mouth daily. 90 tablet 3  . fluticasone (FLONASE) 50 MCG/ACT nasal spray USE 2 SPRAYS INTO EACH NOSTRIL EVERY DAY 16 g 2  . furosemide (LASIX) 20 MG tablet Take 1 tablet (20 mg total) by mouth daily as needed (leg swelling). 30 tablet 3  . hydrOXYzine (ATARAX/VISTARIL) 25 MG tablet Take 0.5-2 tablets (12.5-50 mg total)  by mouth at bedtime as needed for anxiety (insomnia). 60 tablet 2  . ipratropium (ATROVENT) 0.03 % nasal spray USE 2 SPRAYS INTO THE NOSE EVERY 12 (TWELVE) HOURS. 30 mL 9  . meloxicam (MOBIC) 15 MG tablet   1  . metaxalone (SKELAXIN) 800 MG tablet Take 400-800 mg by mouth daily as needed for pain (for muscle spasms).     . montelukast (SINGULAIR) 10 MG tablet Take 1 tablet (10 mg total) by mouth daily. 90 tablet 3  . Multiple Vitamin (MULTIVITAMIN) tablet Take 1 tablet by mouth daily.    Marland Kitchen NITROSTAT 0.4 MG SL tablet Place 0.4 mg under the tongue every 5 (five) minutes as needed for chest pain. For chest pain.    Marland Kitchen OVER THE COUNTER MEDICATION Pre menopause tablets taking bid    . Probiotic Product (PROBIOTIC DAILY) CAPS Take 1 capsule by mouth 2 (two) times a week.    . ranitidine (ZANTAC) 150 MG tablet Take 150 mg by mouth as needed for heartburn.     . zolpidem (AMBIEN) 10 MG tablet Take 10 mg by mouth at bedtime as needed for sleep.      No current facility-administered medications for this visit.    Physical Exam: Filed Vitals:   11/12/14 1238  BP: 94/68  Pulse: 61  Height: 5' 8"  (1.727 m)  Weight: 70.308 kg (155 lb)    GEN- The patient is well appearing, alert and oriented x 3 today.   Head- normocephalic, atraumatic Eyes-  Sclera clear, conjunctiva pink Ears- hearing intact Oropharynx- clear Lungs- Clear to ausculation bilaterally, normal work of breathing Chest- pacemaker pocket is well healed Heart- Regular rate and rhythm, no murmurs, rubs or gallops, PMI not laterally displaced GI- soft, NT, ND, + BS Extremities- no clubbing, cyanosis, or edema  Pacemaker interrogation- reviewed in detail today,  See PACEART report  Assessment and Plan:  1. Sick sinus Normal pacemaker function See Pace Art report No changes today  2. Hypertension Hypotensive since starting edarbe Stop edarbe today Follow-up with Dr Melynda Ripple  Return to see EP NP in 1 year Follow-up  with Dr Doylene Canard as scheduled

## 2014-11-13 ENCOUNTER — Ambulatory Visit (INDEPENDENT_AMBULATORY_CARE_PROVIDER_SITE_OTHER): Payer: Medicare Other | Admitting: Physician Assistant

## 2014-11-13 ENCOUNTER — Encounter: Payer: Self-pay | Admitting: Physician Assistant

## 2014-11-13 VITALS — BP 122/77 | HR 73 | Temp 98.7°F | Resp 16 | Ht 67.0 in | Wt 154.8 lb

## 2014-11-13 DIAGNOSIS — E559 Vitamin D deficiency, unspecified: Secondary | ICD-10-CM | POA: Diagnosis not present

## 2014-11-13 DIAGNOSIS — M797 Fibromyalgia: Secondary | ICD-10-CM | POA: Diagnosis not present

## 2014-11-13 DIAGNOSIS — F32A Depression, unspecified: Secondary | ICD-10-CM

## 2014-11-13 DIAGNOSIS — E785 Hyperlipidemia, unspecified: Secondary | ICD-10-CM | POA: Diagnosis not present

## 2014-11-13 DIAGNOSIS — Z0000 Encounter for general adult medical examination without abnormal findings: Secondary | ICD-10-CM | POA: Diagnosis not present

## 2014-11-13 DIAGNOSIS — R1013 Epigastric pain: Secondary | ICD-10-CM | POA: Diagnosis not present

## 2014-11-13 DIAGNOSIS — F418 Other specified anxiety disorders: Secondary | ICD-10-CM

## 2014-11-13 DIAGNOSIS — F329 Major depressive disorder, single episode, unspecified: Secondary | ICD-10-CM

## 2014-11-13 DIAGNOSIS — L409 Psoriasis, unspecified: Secondary | ICD-10-CM

## 2014-11-13 DIAGNOSIS — J310 Chronic rhinitis: Secondary | ICD-10-CM

## 2014-11-13 DIAGNOSIS — Z23 Encounter for immunization: Secondary | ICD-10-CM

## 2014-11-13 DIAGNOSIS — Z860100 Personal history of colon polyps, unspecified: Secondary | ICD-10-CM

## 2014-11-13 DIAGNOSIS — M79671 Pain in right foot: Secondary | ICD-10-CM | POA: Diagnosis not present

## 2014-11-13 DIAGNOSIS — E119 Type 2 diabetes mellitus without complications: Secondary | ICD-10-CM | POA: Diagnosis not present

## 2014-11-13 DIAGNOSIS — R079 Chest pain, unspecified: Secondary | ICD-10-CM | POA: Diagnosis not present

## 2014-11-13 DIAGNOSIS — M810 Age-related osteoporosis without current pathological fracture: Secondary | ICD-10-CM

## 2014-11-13 DIAGNOSIS — I10 Essential (primary) hypertension: Secondary | ICD-10-CM | POA: Diagnosis not present

## 2014-11-13 DIAGNOSIS — J0120 Acute ethmoidal sinusitis, unspecified: Secondary | ICD-10-CM

## 2014-11-13 DIAGNOSIS — F419 Anxiety disorder, unspecified: Secondary | ICD-10-CM

## 2014-11-13 DIAGNOSIS — J322 Chronic ethmoidal sinusitis: Secondary | ICD-10-CM | POA: Insufficient documentation

## 2014-11-13 LAB — LIPID PANEL
CHOL/HDL RATIO: 4.7 ratio
Cholesterol: 193 mg/dL (ref 0–200)
HDL: 41 mg/dL — ABNORMAL LOW (ref >=46)
LDL Cholesterol: 140 mg/dL — ABNORMAL HIGH (ref 0–99)
Triglycerides: 59 mg/dL (ref <150)
VLDL: 12 mg/dL (ref 0–40)

## 2014-11-13 LAB — COMPREHENSIVE METABOLIC PANEL
ALBUMIN: 4.6 g/dL (ref 3.5–5.2)
ALK PHOS: 48 U/L (ref 39–117)
ALT: 25 U/L (ref 0–35)
AST: 20 U/L (ref 0–37)
BUN: 15 mg/dL (ref 6–23)
CALCIUM: 9.7 mg/dL (ref 8.4–10.5)
CO2: 27 mEq/L (ref 19–32)
Chloride: 102 mEq/L (ref 96–112)
Creat: 0.7 mg/dL (ref 0.50–1.10)
Glucose, Bld: 89 mg/dL (ref 70–99)
Potassium: 4.2 mEq/L (ref 3.5–5.3)
Sodium: 137 mEq/L (ref 135–145)
Total Bilirubin: 0.5 mg/dL (ref 0.2–1.2)
Total Protein: 8.1 g/dL (ref 6.0–8.3)

## 2014-11-13 LAB — GLUCOSE, POCT (MANUAL RESULT ENTRY): POC Glucose: 87 mg/dl (ref 70–99)

## 2014-11-13 LAB — POCT GLYCOSYLATED HEMOGLOBIN (HGB A1C): Hemoglobin A1C: 6

## 2014-11-13 LAB — LIPASE: LIPASE: 24 U/L (ref 0–75)

## 2014-11-13 MED ORDER — BENZONATATE 100 MG PO CAPS: 100.0000 mg | ORAL_CAPSULE | Freq: Three times a day (TID) | ORAL | Status: DC | PRN

## 2014-11-13 MED ORDER — DESOXIMETASONE 0.25 % EX OINT
1.0000 | TOPICAL_OINTMENT | Freq: Two times a day (BID) | CUTANEOUS | Status: DC | PRN
Start: 2014-11-13 — End: 2019-07-30

## 2014-11-13 MED ORDER — FENOFIBRATE 160 MG PO TABS: 160.0000 mg | ORAL_TABLET | Freq: Every day | ORAL | Status: DC

## 2014-11-13 MED ORDER — CEFDINIR 300 MG PO CAPS: 600.0000 mg | ORAL_CAPSULE | Freq: Every day | ORAL | Status: AC

## 2014-11-13 MED ORDER — FLUTICASONE PROPIONATE 50 MCG/ACT NA SUSP: NASAL | Status: DC

## 2014-11-13 NOTE — Progress Notes (Signed)
Patient ID: [name], female    DOB: 1955-10-15, 59 y.o.   MRN: [id number]  PCP: Nancie Bocanegra, PA-C  Chief Complaint  Patient presents with  . Annual Exam    patient requesting colonoscopy  . Medication Refill  . Depression    positive answers on depression scale  . Diabetes    Subjective:   HPI:  Presents for Annual Exam.  Ophthalmology: Appointment tomorrow  Dental: Appointment next week.  Mammo - Last done 04/29/2014, normal results, due 04/30/2015  Colonoscopy - Last performed 07/08/2011 by Dr. Collene Mares, benign colon polyps, told to return in 4 years. Pt states she wants a colonoscopy d/t the abdominal pain she has been experiencing.  Pap - last done 11/04/2013, normal. Scheduled to get done at the end of the month.  Vaccines: Due for pneumococcal due 23 valent d/t diabetes.   Pt diagnosed in February and began seeing a diabetes educator. She continues to see the educator and has made adjustments to her diet with improvement. She reports that she still has nocturia 2-3x at every night, polydipsia, polyphagia, and feels weak if she doesn't eat. She also reports having numbness/tingling in both feet when she lays down at night. She performs self foot exams at home.   Pt also complains of intermittent, severe, sharp, epigastric pain that began 3 weeks ago after eating her normal bowl of cereal in the morning with lactose-free milk. She reported nausea with no vomiting with the first episode. Since then, she has been having these episodes every morning, before and after eating her cereal, and they last about 1 hour at a time. No nausea with subsequence episodes. Pt has been taking her percocet to control the pain with relief, and tried taking bentyl, which was prescribed for her in the past for other reasons, yesterday, with little relief. Although she says her heart doctor does not want her taking the Bentyl. She has reported no constipation or diarrhea, and lost 6 lbs over the last 2-3  months. Laying in a fetal position and not eating helps with the pain. She would like a colonoscopy done, and labs to look at her liver, GB, and pancreas.   Pt wanted to discuss right foot pain that she has been having. Pain is "severe" and located on the dorsal side of her foot, near her ankle. A stack of chairs fells on her right foot last Wednesday 11/06/2014 while she was at [facility]. She filed a complaint with [employer] and was seen in clinic last week after the incident. X-rays were performed and showed no evidence of fracture. Pt says the pain has gotten better since last week, but it is still very painful and she finds it difficult to walk on it at times. She also reports decreased sensation in that foot since the incident.   Pt is also complaining of ENT and respiratory symptoms. 2 weeks ago, she began experiencing sinus pressure/pain, rhinorrhea, PND, sore throat, and fevers and chills. She was able to produce some mucus, which was yellow last week ,and clear this week. Pt did not take her temperature. The sinus pressure is in maxillary, frontal, and ethmoidal sinuses b/l and pain is in left ethmoidal sinus. Then, about 1 week ago, she began developing a cough with clear sputum production in addition to her previous symptoms. Then, Saturday, she began to experience what she describes as "pleurisy", she can "feel herself wheezing" at times, and she needs to use her rescue inhaler more often. She  has superficial, lateral pain, just below her left armpit that is worse when she takes a deep breath or coughs. It is reproducible with palpation. She states she has had PND drip before, "the fluid drained down into my lungs, I got pleurisy and then a deep bronchitis from it". She was treated for an ethmoidal sinusitis on 10/11/14 via an E-visit with Augmentin. She completed the 7-day course, felt better, but now it is back. She is using her flonase 1-2x daily, 2 sprays in each nostril since she began having issues  with her sinuses, with little relief. She needs a refill of her flonase.   She feels as though her chronic depression is doing better and the medication regimen she is on is helping. She also is seeing a therapist, which is helping a lot. She states that about 3-4 days/month, she wakes up "feeling down", but then she "finds something to do or prays" and she feels better. She no longer has feelings of wanting to hurt herself.   She states that her fibromyalgia is very bothersome. She experiences 2-3 days/week where she just doesn't feel like doing anything because she is tired and hurts all over. She is currently being seen at a pain clinic to manage her pain.   She feels good about her psoriasis. She is tolerating the topicort well. It flares up about every 2 months on her feet, especially after hot weather. She uses the cream 3-4x daily when this happens. She needs a refill of the topicort.   She needs a refill of her fenofibrate. She is tolerating it well, without issues. She is trying to follow the diet given to her by the diabetes educator, but is not exercising much d/t pain.   She also requests that her vitamin D is checked and she would like to get a bone density scan for her osteoporosis.     Patient Active Problem List   Diagnosis Date Noted  . Right foot pain 11/13/2014  . Ethmoid sinusitis 11/13/2014  . Abdominal pain, epigastric 11/13/2014  . Vitamin D deficiency 11/13/2014  . 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 Sick sinus syndrome 07/08/2014  . Chest pain at rest 07/05/2014  . Chronic rhinitis 10/22/2013  . Fatty liver 10/22/2013  . Interstitial cystitis 10/22/2013  . Hip pain, right 10/22/2013  . Chronic insomnia 09/25/2012  . HTN (hypertension) 09/25/2012  . Hyperlipidemia 09/25/2012  . Diabetes mellitus type 2, controlled, without complications 79/89/2119  . GERD 12/04/2008  . History of colonic polyps 12/04/2008  . CONSTIPATION 10/21/2008  . Fibromyalgia 10/21/2008  .  LACTOSE INTOLERANCE 11/20/2007  . DIVERTICULITIS, ACUTE 11/20/2007  . CHEST PAIN 11/20/2007  . Anxiety and depression 08/02/2007  . Osteoporosis 08/02/2007  . DIVERTICULOSIS, COLON 11/16/2006  . EXTERNAL HEMORRHOIDS 05/12/2005    Past Medical History  Diagnosis Date  . GERD (gastroesophageal reflux disease)   . Arthritis   . Anxiety   . Fibromyalgia   . Bursitis     right hip  . Anal fissure 02/2012  . Hypertension     under control, has been on med. since 2010  . Tarsal tunnel syndrome of left side   . Complication of anesthesia     difficult to wake up post-op  . Depression   . Asthma     daily and prn inhalers  . Irritable bowel syndrome (IBS)   . TMJ (temporomandibular joint syndrome)     left  . Hyperlipidemia   . Pneumonia ~ 06/2012  . Chronic  bronchitis     "get it about q year" (12/27/2012)  . Tarsal tunnel syndrome of left side   . Hepatitis A infection 1979  . 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 Allergy   . Diabetes mellitus without complication      Prior to Admission medications   Medication Sig Start Date End Date Taking? Authorizing Provider  albuterol (PROVENTIL HFA;VENTOLIN HFA) 108 (90 BASE) MCG/ACT inhaler Inhale 2 puffs into the lungs every 6 (six) hours as needed for wheezing. 08/15/13  Yes Elektra Wartman, PA-C  ALPRAZolam (XANAX) 1 MG tablet TAKE 1 TABLET TWICE A DAY AS NEEDED FOR ANXIETY 09/02/14  Yes Zhamir Pirro, PA-C  beclomethasone (QVAR) 40 MCG/ACT inhaler Inhale 1-2 puffs into the lungs 2 (two) times daily. 03/26/14 03/27/15 Yes Stephanie D English, PA  Desoximetasone (TOPICORT) 0.25 % ointment Apply 1 application topically 2 (two) times daily as needed (for psoriasis). 11/13/14  Yes Saoirse Legere, PA-C  dexlansoprazole (DEXILANT) 60 MG capsule Take 60 mg by mouth daily as needed (for acid reflux).   Yes Historical Provider, MD  diazepam (VALIUM) 5 MG tablet Take 1 tablet (5 mg total)  by mouth daily. 07/18/13  Yes Robb Sibal, PA-C  Dicyclomine HCl (BENTYL PO) Take by mouth.   Yes Historical Provider, MD  docusate sodium (COLACE) 100 MG capsule TAKE 1 CAPSULE (100 MG TOTAL) BY MOUTH DAILY AS NEEDED FOR MILD CONSTIPATION. 10/23/14  Yes Demetrice Combes, PA-C  escitalopram (LEXAPRO) 10 MG tablet Take 10 mg by mouth daily. 09/02/14  Yes Historical Provider, MD  fenofibrate 160 MG tablet Take 1 tablet (160 mg total) by mouth daily. 11/13/14  Yes Tarez Bowns, PA-C  fluticasone (FLONASE) 50 MCG/ACT nasal spray USE 2 SPRAYS INTO EACH NOSTRIL EVERY DAY 11/13/14  Yes Elizabethanne Lusher, PA-C  furosemide (LASIX) 20 MG tablet Take 1 tablet (20 mg total) by mouth daily as needed (leg swelling). 10/22/13  Yes Arwen Haseley, PA-C  hydrOXYzine (ATARAX/VISTARIL) 25 MG tablet Take 0.5-2 tablets (12.5-50 mg total) by mouth at bedtime as needed for anxiety (insomnia). 07/18/13  Yes Arlind Klingerman, PA-C  ipratropium (ATROVENT) 0.03 % nasal spray USE 2 SPRAYS INTO THE NOSE EVERY 12 (TWELVE) HOURS.   Yes Raeven Pint, PA-C  meloxicam (MOBIC) 15 MG tablet  06/05/14  Yes Historical Provider, MD  metaxalone (SKELAXIN) 800 MG tablet Take 400-800 mg by mouth daily as needed for pain (for muscle spasms).    Yes Historical Provider, MD  montelukast (SINGULAIR) 10 MG tablet Take 1 tablet (10 mg total) by mouth daily. 09/02/14  Yes Dennies Coate, PA-C  Multiple Vitamin (MULTIVITAMIN) tablet Take 1 tablet by mouth daily.   Yes Historical Provider, MD  NITROSTAT 0.4 MG SL tablet Place 0.4 mg under the tongue every 5 (five) minutes as needed for chest pain. For chest pain. 11/24/11  Yes Historical Provider, MD  OVER THE COUNTER MEDICATION Pre menopause tablets taking bid   Yes Historical Provider, MD  oxyCODONE-acetaminophen (PERCOCET) 10-325 MG per tablet Take 10-325 tablets by mouth as needed. 11/05/14  Yes Historical Provider, MD  Probiotic Product (PROBIOTIC DAILY) CAPS Take 1 capsule by mouth 2 (two) times a week.    Yes Historical Provider, MD  ranitidine (ZANTAC) 150 MG tablet Take 150 mg by mouth as needed for heartburn.    Yes Historical Provider, MD  zolpidem (AMBIEN) 10 MG tablet Take 10 mg by mouth at bedtime as needed for sleep.  09/28/13  Yes Historical Provider, MD  aspirin 81 MG chewable tablet Chew 81 mg by mouth daily.    Historical Provider, MD  benzonatate (TESSALON) 100 MG capsule Take 1-2 capsules (100-200 mg total) by mouth 3 (three) times daily as needed for cough. 11/13/14   Ronni Osterberg, PA-C  cefdinir (OMNICEF) 300 MG capsule Take 2 capsules (600 mg total) by mouth daily. 11/13/14 11/23/14  [name], PA-C    Allergies  Allergen Reactions  . Codeine Other (See Comments)    HALLUCINATIONS   . Propoxyphene N-Acetaminophen Other (See Comments)    HALLUCINATIONS  . Sulfonamide Derivatives Hives, Itching and Swelling  . Alka-Seltzer [Aspirin Effervescent] Other (See Comments)    "like she's fading away"  . Norvasc [Amlodipine Besylate]   . Iohexol Nausea Only    JITTERY    . Latex Hives and Itching    Past Surgical History  Procedure Laterality Date  . Shoulder arthroscopy w/ rotator cuff repair Left 1999  . Bilateral salpingoophorectomy Bilateral 04/2011  . Cystoscopy w/ ureteral stent placement  08/04/2009  . Laparoscopic lysis intestinal adhesions  08/04/2009  . Cardiac catheterization  10/08/2007  . Examination under anesthesia  02/17/2012    Procedure: EXAM UNDER ANESTHESIA;  Surgeon: Pedro Earls, MD;  Location: [facility];  Service: General;  Laterality: N/A;  Exam under anesthesia with lateral internal sphincterotomy  . Sphincterotomy  02/17/2012    Procedure: SPHINCTEROTOMY;  Surgeon: Pedro Earls, MD;  Location: [facility];  Service: General;  Laterality: N/A;  . Abdominal hysterectomy  09/1994    partial  . Appendectomy    . Cesarean section    . Small intestine surgery    . Pacemaker insertion  07/08/14    MDT MRI compatible dual  chamber pacemaker implanted by Dr Rayann Heman for symptomatic bradycardia  . Permanent pacemaker insertion N/A 07/08/2014    Procedure: PERMANENT PACEMAKER INSERTION;  Surgeon: [name], MD;  Location: [facility] CATH LAB;  Service: Cardiovascular;  Laterality: N/A;    Family History  Problem Relation Age of Onset  . Cancer Mother     liver  . Diabetes Mother   . Hyperlipidemia Mother   . Hypertension Mother   . Stroke Mother   . Cancer Maternal Grandmother     breast  . Anesthesia problems Sister     hard to wake up post-op  . Heart disease Father   . Hyperlipidemia Brother   . HIV/AIDS Brother   . Cancer Maternal Grandfather     colon  . Diabetes Brother   . Cancer Brother     lung  . Cancer Paternal Grandfather     COLON    History   Social History  . Marital Status: Divorced    Spouse Name: n/a  . Number of Children: 1  . Years of Education: 17   Occupational History  . Disabled     Fibromyalgia, Depression, Anxiety   Social History Main Topics  . Smoking status: Former Smoker -- 0.00 packs/day for 37 years    Types: Cigarettes    Quit date: 06/05/2012  . Smokeless tobacco: Never Used  . Alcohol Use: No  . Drug Use: No  . Sexual Activity: Not Currently    Birth Control/ Protection: Post-menopausal   Other Topics Concern  . None   Social History Narrative   Lives alone.  Daughter and granddaughter live in [place], [place]. Pt exercise sometimes.  Review of Systems See above. Constitutional: Positive for fever, chills, diaphoresis, appetite change (Decreased since began having issues with epigastric pain), fatigue and unexpected weight change (6 pounds over 2-3 months).  HENT: Positive for congestion, postnasal drip, rhinorrhea, sinus pressure (In maxillary, frontal, and ethmoidal sinuses) and sore throat (From PND). Negative for dental problem, drooling, ear discharge, ear pain, facial swelling, hearing loss, mouth sores, nosebleeds, sneezing,  tinnitus, trouble swallowing and voice change.  Eyes: Positive for visual disturbance. Negative for photophobia, pain, discharge, redness and itching.  Respiratory: Positive for cough, shortness of breath, wheezing and stridor. Negative for apnea, choking and chest tightness.  Cardiovascular: Positive for chest pain (Pain with deep breath and cough in left lateral rib cage.). Negative for palpitations and leg swelling.  Gastrointestinal: Positive for abdominal pain (Episodic sharp epigastric pain occuring before and after eating cereal in the morning that lasts about 1 hour.). Negative for nausea, vomiting, diarrhea, constipation, blood in stool, abdominal distention, anal bleeding and rectal pain.   Real bad  Endocrine: Positive for cold intolerance, heat intolerance, polydipsia, polyphagia and polyuria (Nocturia).  Genitourinary: Positive for frequency (Especially at night). Negative for dysuria, urgency, hematuria, flank pain, decreased urine volume, vaginal bleeding, vaginal discharge, enuresis, difficulty urinating, genital sores, vaginal pain, menstrual problem and pelvic pain.  Musculoskeletal: Positive for myalgias (D/t fibromyalgia), back pain (D/t fibromyalgia), arthralgias (D/t fibromyalgia and arthritis) and neck pain (D/t fibromyalgia). Negative for joint swelling, gait problem and neck stiffness.  Skin: Negative.  Allergic/Immunologic: Positive for environmental allergies. Negative for food allergies.  Neurological: Positive for dizziness ("If I don't eat"), weakness ("If I don't eat"), light-headedness ("If I don't eat") and numbness (Numbness and tingling in feet at night when she lays down b/l. Dulled senses in right foot "since the chairs fell on me at [place]".). Negative for tremors, seizures, syncope, facial asymmetry, speech difficulty and headaches.   Somtimes  Hematological: Negative for adenopathy. Bruises/bleeds easily.  Psychiatric/Behavioral: Positive for  dysphoric mood (Occasionally wakes up feeling sad, which quickly resolves after praying and finding something to do.). Negative for suicidal ideas, hallucinations, behavioral problems, confusion, sleep disturbance, self-injury, decreased concentration and agitation. The patient is nervous/anxious. The patient is not hyperactive.      Objective:  Physical Exam  Constitutional: She is oriented to person, place, and time. She appears well-developed and well-nourished. She is active and cooperative. No distress.  BP 122/77 mmHg  Pulse 73  Temp(Src) 98.7 F (37.1 C) (Oral)  Resp 16  Ht 5' 7"  (1.702 m)  Wt 154 lb 12.8 oz (70.217 kg)  BMI 24.24 kg/m2  SpO2 99%   HENT:  Head: Normocephalic and atraumatic.  Right Ear: Hearing, tympanic membrane, external ear and ear canal normal. No foreign bodies.  Left Ear: Hearing, tympanic membrane, external ear and ear canal normal. No foreign bodies.  Nose: Mucosal edema and sinus tenderness present. No rhinorrhea. Right sinus exhibits maxillary sinus tenderness and frontal sinus tenderness. Left sinus exhibits maxillary sinus tenderness and frontal sinus tenderness.  Mouth/Throat: Uvula is midline, oropharynx is clear and moist and mucous membranes are normal. No oral lesions. Normal dentition. No dental abscesses or uvula swelling. No oropharyngeal exudate.  Eyes: Conjunctivae, EOM and lids are normal. Pupils are equal, round, and reactive to light. Right eye exhibits no discharge. Left eye exhibits no discharge. No scleral icterus.  Fundoscopic exam:      The right eye shows no arteriolar narrowing, no AV nicking, no exudate, no hemorrhage and no papilledema. The  right eye shows red reflex.       The left eye shows no arteriolar narrowing, no AV nicking, no exudate, no hemorrhage and no papilledema. The left eye shows red reflex.  Neck: Trachea normal, normal range of motion and full passive range of motion without pain. Neck supple. Muscular tenderness  present. No spinous process tenderness present. No thyroid mass and no thyromegaly present.  Cardiovascular: Normal rate, regular rhythm, normal heart sounds, intact distal pulses and normal pulses.   Pulmonary/Chest: Effort normal and breath sounds normal. She exhibits tenderness. Right breast exhibits tenderness. Right breast exhibits no inverted nipple, no mass, no nipple discharge and no skin change. Left breast exhibits tenderness. Left breast exhibits no inverted nipple, no mass, no nipple discharge and no skin change. Breasts are symmetrical.  Musculoskeletal: She exhibits no edema or tenderness.       Cervical back: Normal.       Thoracic back: Normal.       Lumbar back: Normal.  No bony tenderness except the RIGHT foot/ankle, where she sustained the injury last week. Soft tissue tenderness is generalized and consistent with her baseline.  Lymphadenopathy:       Head (right side): No tonsillar, no preauricular, no posterior auricular and no occipital adenopathy present.       Head (left side): No tonsillar, no preauricular, no posterior auricular and no occipital adenopathy present.    She has no cervical adenopathy.       Right: No supraclavicular adenopathy present.       Left: No supraclavicular adenopathy present.  Neurological: She is alert and oriented to person, place, and time. She has normal strength and normal reflexes. A sensory deficit (RIGHT foot sensation is less, which she attributes to the injury last week.) is present. No cranial nerve deficit. She exhibits normal muscle tone. Coordination and gait normal.  Skin: Skin is warm, dry and intact. No rash noted. She is not diaphoretic. No cyanosis or erythema. Nails show no clubbing.  Psychiatric: She has a normal mood and affect. Her speech is normal and behavior is normal. Judgment and thought content normal.   Diabetic Foot Exam - Simple   Simple Foot Form  Diabetic Foot exam was performed with the following findings:  Yes  11/13/2014 12:43 PM  Visual Inspection  No deformities, no ulcerations, no other skin breakdown bilaterally:  Yes  Sensation Testing  Intact to touch and monofilament testing bilaterally:  Yes  See comments:  Yes  Pulse Check  Posterior Tibialis and Dorsalis pulse intact bilaterally:  Yes  Comments  Sensation was intact bilaterally, but pt reports sensation was less intense on her right foot. She thinks it is due to her injury to the top of her right foot last week. She also reports paresthesias bilaterally at night sometimes.          Results for orders placed or performed in visit on 11/13/14  POCT glycosylated hemoglobin (Hb A1C)  Result Value Ref Range   Hemoglobin A1C 6.0   POCT glucose (manual entry)  Result Value Ref Range   POC Glucose 87 70 - 99 mg/dl       Assessment & Plan:  1. Annual physical exam Age appropriate anticipatory guidance provided.  2. Right foot pain Injury last week is resolving. Anticipate it will be a prolonged recovery for her.  3. Abdominal pain, epigastric Await labs. Refer back to GI for additional evaluation, which is her preference. - Lipase - Ambulatory referral to Gastroenterology  4. History of colonic polyps Defer to GI.  5. Vitamin D deficiency Await labs. Continue calcium and vitamin D supplementation. - Vit D  25 hydroxy (rtn osteoporosis monitoring)  6. Chronic rhinitis Has been better controlled since the addition of Singular to her regimen. Refill Flonase. - fluticasone (FLONASE) 50 MCG/ACT nasal spray; USE 2 SPRAYS INTO EACH NOSTRIL EVERY DAY  Dispense: 16 g; Refill: 2  7. Fibromyalgia Stale.  8. Diabetes mellitus type 2, controlled, without complications Very well controlled. Continue efforts for healthylifestyle changes and work with diabetes educatior. - Comprehensive metabolic panel - Microalbumin, urine - POCT glycosylated hemoglobin (Hb A1C) - POCT glucose (manual entry)  9. Subacute ethmoidal sinusitis Less  frequent since starting Singulair. - cefdinir (OMNICEF) 300 MG capsule; Take 2 capsules (600 mg total) by mouth daily.  Dispense: 20 capsule; Refill: 0 - benzonatate (TESSALON) 100 MG capsule; Take 1-2 capsules (100-200 mg total) by mouth 3 (three) times daily as needed for cough.  Dispense: 40 capsule; Refill: 0  10. Hyperlipidemia Await labs. Adjust regimen if needed. - fenofibrate 160 MG tablet; Take 1 tablet (160 mg total) by mouth daily.  Dispense: 90 tablet; Refill: 3 - Lipid panel  11. Chest pain at rest This is the "pleurisy" she's describing associated with sinus symptoms and resulting cough, rather than the chest pain she's had associated with cardiac dysfunction. See above.  12. Anxiety and depression Stable. Continue current treatment and therapy.  13. Essential hypertension Controlled.  14. Need for pneumococcal vaccination Deferred due to illness. Plan to provide at next visit.  15. Psoriasis Controlled. - Desoximetasone (TOPICORT) 0.25 % ointment; Apply 1 application topically 2 (two) times daily as needed (for psoriasis).  Dispense: 30 g; Refill: 6  16. Osteoporosis Await vitamin D and updated DEXA.   Fara Chute, PA-C Physician Assistant-Certified [facility]

## 2014-11-13 NOTE — Patient Instructions (Addendum)
I will contact you with your lab results as soon as they are available.   If you have not heard from me in 2 weeks, please contact me.  The fastest way to get your results is to register for My Chart (see the instructions on the last page of this printout).  Please call to schedule the Bone Density Test (it's ordered, you just need to schedule it!). You can do that to coincide with the mammogram later this year.  Keeping You Healthy  Get These Tests  Blood Pressure- Have your blood pressure checked by your healthcare provider at least once a year.  Normal blood pressure is 120/80.  Weight- Have your body mass index (BMI) calculated to screen for obesity.  BMI is a measure of body fat based on height and weight.  You can calculate your own BMI at [employer]  Cholesterol- Have your cholesterol checked every year.  Diabetes- Have your blood sugar checked every year if you have high blood pressure, high cholesterol, a family history of diabetes or if you are overweight.  Pap Test - Have a pap test every 1 to 5 years if you have been sexually active.  If you are older than 65 and recent pap tests have been normal you may not need additional pap tests.  In addition, if you have had a hysterectomy  for benign disease additional pap tests are not necessary.  Mammogram-Yearly mammograms are essential for early detection of breast cancer  Screening for Colon Cancer- Colonoscopy starting at age 47. Screening may begin sooner depending on your family history and other health conditions.  Follow up colonoscopy as directed by your Gastroenterologist.  Screening for Osteoporosis- Screening begins at age 33 with bone density scanning, sooner if you are at higher risk for developing Osteoporosis.  Get these medicines  Calcium with Vitamin D- Your body requires 1200-1500 mg of Calcium a day and [phone] IU of Vitamin D a day.  You can only absorb 500 mg of Calcium at a time therefore Calcium  must be taken in 2 or 3 separate doses throughout the day.  Hormones- Hormone therapy has been associated with increased risk for certain cancers and heart disease.  Talk to your healthcare provider about if you need relief from menopausal symptoms.  Aspirin- Ask your healthcare provider about taking Aspirin to prevent Heart Disease and Stroke.  Get these Immuniztions  Flu shot- Every fall  Pneumonia shot- Once after the age of 33; if you are younger ask your healthcare provider if you need a pneumonia shot.  Tetanus- Every ten years.  Zostavax- Once after the age of 90 to prevent shingles.  Take these steps  Don't smoke- Your healthcare provider can help you quit. For tips on how to quit, ask your healthcare provider or go to www.smokefree.gov or call 1-800 QUIT-NOW.  Be physically active- Exercise 5 days a week for a minimum of 30 minutes.  If you are not already physically active, start slow and gradually work up to 30 minutes of moderate physical activity.  Try walking, dancing, bike riding, swimming, etc.  Eat a healthy diet- Eat a variety of healthy foods such as fruits, vegetables, whole grains, low fat milk, low fat cheeses, yogurt, lean meats, chicken, fish, eggs, dried beans, tofu, etc.  For more information go to www.thenutritionsource.org  Dental visit- Brush and floss teeth twice daily; visit your dentist twice a year.  Eye exam- Visit your Optometrist or Ophthalmologist yearly.  Drink alcohol in moderation- Limit  alcohol intake to one drink or less a day.  Never drink and drive.  Depression- Your emotional health is as important as your physical health.  If you're feeling down or losing interest in things you normally enjoy, please talk to your healthcare provider.  Seat Belts- can save your life; always wear one  Smoke/Carbon Monoxide detectors- These detectors need to be installed on the appropriate level of your home.  Replace batteries at least once a  year.  Violence- If anyone is threatening or hurting you, please tell your healthcare provider.  Living Will/ Health care power of attorney- Discuss with your healthcare provider and family.

## 2014-11-13 NOTE — Progress Notes (Signed)
Subjective:    Patient ID: [name], female    DOB: October 02, 1955, 59 y.o.   MRN: [id number]   HPI: Pt reports to clinic today for her annual exam and medication refills.   Ophthalmology: Appointment tomorrow Dental: Appointment next week. Mammo - Last done 04/29/2014, normal results, due 04/30/2015 Colonoscopy - Last performed 07/08/2011 by Dr. Collene Mares, benign colon polyps, told to return in 4 years. Pt states she wants a colonoscopy d/t the abdominal pain she has been experiencing.  Pap - last done 11/04/2013, normal. Scheduled to get done at the end of the month. Vaccines: Due for pneumococcal due 23 valent d/t diabetes.  Pt diagnosed in February and began seeing a diabetes educator. She continues to see the educator and has made adjustments to her diet with improvement. She reports that she still has nocturia 2-3x at every night, polydipsia, polyphagia, and feels weak if she doesn't eat. She also reports having numbness/tingling in both feet when she lays down at night. She performs self foot exams at home.   Pt also complains of intermittent, severe, sharp, epigastric pain that began 3 weeks ago after eating her normal bowl of cereal in the morning with lactose-free milk. She reported nausea with no vomiting with the first episode. Since then, she has been having these episodes every morning, before and after eating her cereal, and they last about 1 hour at a time. No nausea with subsequence episodes. Pt has been taking her percocet to control the pain with relief, and tried taking bentyl, which was prescribed for her in the past for other reasons, yesterday, with little relief. Although she says her heart doctor does not want her taking the Bentyl. She has reported no constipation or diarrhea, and lost 6 lbs over the last 2-3 months. Laying in a fetal position and not eating helps with the pain.  She would like a colonoscopy done, and labs to look at her liver, GB, and pancreas.   Pt wanted to  discuss right foot pain that she has been having. Pain is "severe" and located on the dorsal side of her foot, near her ankle. A stack of chairs fells on her right foot last Wednesday while she was at [facility]. She filed a complaint with [employer] and was seen in clinic last week after the incident. X-rays were performed and showed no evidence of fracture. Pt says the pain has gotten better since last week, but it is still very painful and she finds it difficult to walk on it at times. She also reports decreased sensation in that foot since the incident.   Pt is also complaining of ENT and respiratory symptoms. 2 weeks ago, she began experiencing sinus pressure/pain, rhinorrhea, PND, sore throat, and fevers and chills. She was able to produce some mucus, which was yellow last week ,and clear this week. Pt did not take her temperature. The sinus pressure is in maxillary, frontal, and ethmoidal sinuses b/l and pain is in left ethmoidal sinus. Then, about 1 week ago, she began developing a cough with clear sputum production in addition to her previous symptoms. Then, Saturday, she began to experience what she describes as "pleurisy", she can "feel herself wheezing" at times, and she needs to use her rescue inhaler more often. She has superficial, lateral pain, just below her left armpit that is worse when she takes a deep breath or coughs. It is reproducible with palpation. She states she has had PND drip before, "the fluid drained  down into my lungs, I got pleurisy and then a deep bronchitis from it". She was treated for an ethmoidal sinusitis on 10/11/14 via an E-visit with Augmentin. She completed the 7-day course, felt better, but now it is back. She is using her flonase 1-2x daily, 2 sprays in each nostril since she began having issues with her sinuses, with little relief. She needs a refill of her flonase  She feels as though her chronic depression is doing better and the medication regimen she is on is helping.  She also is seeing a therapist, which is helping a lot. She states that about 3-4 days/month, she wakes up "feeling down", but then she "finds something to do or prays" and she feels better. She no longer has feelings of wanting to hurt herself.  She states that her fibromyalgia is very bothersome. She experiences 2-3 days/week where she just doesn't feel like doing anything because she is tired and hurts all over. She is currently being seen at a pain clinic to manage her pain.  She feels good about her psoriasis. She is tolerating the topicort well. It flares up about every 2 months on her feet, especially after hot weather. She uses the cream 3-4x daily when this happens. She needs a refill of the topicort.  She needs a refill of her fenofibrate. She is tolerating it well, without issues. She is trying to follow the diet given to her by the diabetes educator, but is not exercising much d/t pain.  She also requests that her vitamin D is checked and she would like to get a bone density scan for her osteoporosis.   Patient Active Problem List   Diagnosis Date Noted  . Right foot pain 11/13/2014  . Ethmoid sinusitis 11/13/2014  . Abdominal pain, epigastric 11/13/2014  . Vitamin D deficiency 11/13/2014  . 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 Sick sinus syndrome 07/08/2014  . Chest pain at rest 07/05/2014  . Chronic rhinitis 10/22/2013  . Fatty liver 10/22/2013  . Interstitial cystitis 10/22/2013  . Hip pain, right 10/22/2013  . Chronic insomnia 09/25/2012  . HTN (hypertension) 09/25/2012  . Hyperlipidemia 09/25/2012  . Diabetes mellitus type 2, controlled, without complications 61/60/7371  . GERD 12/04/2008  . History of colonic polyps 12/04/2008  . CONSTIPATION 10/21/2008  . Fibromyalgia 10/21/2008  . LACTOSE INTOLERANCE 11/20/2007  . DIVERTICULITIS, ACUTE 11/20/2007  . CHEST PAIN 11/20/2007  . Anxiety and depression 08/02/2007  . Osteoporosis 08/02/2007  . DIVERTICULOSIS, COLON  11/16/2006  . EXTERNAL HEMORRHOIDS 05/12/2005   Past Medical History  Diagnosis Date  . GERD (gastroesophageal reflux disease)   . Arthritis   . Anxiety   . Fibromyalgia   . Bursitis     right hip  . Anal fissure 02/2012  . Hypertension     under control, has been on med. since 2010  . Tarsal tunnel syndrome of left side   . Complication of anesthesia     difficult to wake up post-op  . Depression   . Asthma     daily and prn inhalers  . Irritable bowel syndrome (IBS)   . TMJ (temporomandibular joint syndrome)     left  . Hyperlipidemia   . Pneumonia ~ 06/2012  . Chronic bronchitis     "get it about q year" (12/27/2012)  . Tarsal tunnel syndrome of left side   . Hepatitis A infection 1979  . 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 Allergy   . Diabetes mellitus without complication     Current Outpatient Prescriptions on File Prior to Visit  Medication Sig Dispense Refill  . albuterol (PROVENTIL HFA;VENTOLIN HFA) 108 (90 BASE) MCG/ACT inhaler Inhale 2 puffs into the lungs every 6 (six) hours as needed for wheezing. 18 g 2  . ALPRAZolam (XANAX) 1 MG tablet TAKE 1 TABLET TWICE A DAY AS NEEDED FOR ANXIETY 60 tablet 0  . beclomethasone (QVAR) 40 MCG/ACT inhaler Inhale 1-2 puffs into the lungs 2 (two) times daily. 1 Inhaler 12  . dexlansoprazole (DEXILANT) 60 MG capsule Take 60 mg by mouth daily as needed (for acid reflux).    . diazepam (VALIUM) 5 MG tablet Take 1 tablet (5 mg total) by mouth daily. 30 tablet 0  . docusate sodium (COLACE) 100 MG capsule TAKE 1 CAPSULE (100 MG TOTAL) BY MOUTH DAILY AS NEEDED FOR MILD CONSTIPATION. 30 capsule 0  . furosemide (LASIX) 20 MG tablet Take 1 tablet (20 mg total) by mouth daily as needed (leg swelling). 30 tablet 3  . hydrOXYzine (ATARAX/VISTARIL) 25 MG tablet Take 0.5-2 tablets (12.5-50 mg total) by mouth at bedtime as needed for anxiety (insomnia). 60 tablet 2  . ipratropium  (ATROVENT) 0.03 % nasal spray USE 2 SPRAYS INTO THE NOSE EVERY 12 (TWELVE) HOURS. 30 mL 9  . meloxicam (MOBIC) 15 MG tablet   1  . metaxalone (SKELAXIN) 800 MG tablet Take 400-800 mg by mouth daily as needed for pain (for muscle spasms).     . montelukast (SINGULAIR) 10 MG tablet Take 1 tablet (10 mg total) by mouth daily. 90 tablet 3  . Multiple Vitamin (MULTIVITAMIN) tablet Take 1 tablet by mouth daily.    Marland Kitchen NITROSTAT 0.4 MG SL tablet Place 0.4 mg under the tongue every 5 (five) minutes as needed for chest pain. For chest pain.    Marland Kitchen OVER THE COUNTER MEDICATION Pre menopause tablets taking bid    . Probiotic Product (PROBIOTIC DAILY) CAPS Take 1 capsule by mouth 2 (two) times a week.    . ranitidine (ZANTAC) 150 MG tablet Take 150 mg by mouth as needed for heartburn.     . zolpidem (AMBIEN) 10 MG tablet Take 10 mg by mouth at bedtime as needed for sleep.     Marland Kitchen aspirin 81 MG chewable tablet Chew 81 mg by mouth daily.     No current facility-administered medications on file prior to visit.   Allergies  Allergen Reactions  . Codeine Other (See Comments)    HALLUCINATIONS   . Propoxyphene N-Acetaminophen Other (See Comments)    HALLUCINATIONS  . Sulfonamide Derivatives Hives, Itching and Swelling  . Alka-Seltzer [Aspirin Effervescent] Other (See Comments)    "like she's fading away"  . Norvasc [Amlodipine Besylate]   . Iohexol Nausea Only    JITTERY    . Latex Hives and Itching      Review of Systems  Constitutional: Positive for fever, chills, diaphoresis, appetite change (Decreased since began having issues with epigastric pain), fatigue and unexpected weight change (6 pounds over 2-3 months).  HENT: Positive for congestion, postnasal drip, rhinorrhea, sinus pressure (In maxillary, frontal, and ethmoidal sinuses) and sore throat (From PND). Negative for dental problem, drooling, ear discharge, ear pain, facial swelling, hearing loss, mouth sores, nosebleeds, sneezing, tinnitus,  trouble swallowing and voice change.   Eyes: Positive for visual disturbance. Negative for photophobia, pain, discharge, redness and itching.  Respiratory: Positive for cough, shortness of breath, wheezing and  stridor. Negative for apnea, choking and chest tightness.   Cardiovascular: Positive for chest pain (Pain with deep breath and cough in left lateral rib cage.). Negative for palpitations and leg swelling.  Gastrointestinal: Positive for abdominal pain (Episodic sharp epigastric pain occuring before and after eating cereal in the morning that lasts about 1 hour.). Negative for nausea, vomiting, diarrhea, constipation, blood in stool, abdominal distention, anal bleeding and rectal pain.       Real bad  Endocrine: Positive for cold intolerance, heat intolerance, polydipsia, polyphagia and polyuria (Nocturia).  Genitourinary: Positive for frequency (Especially at night). Negative for dysuria, urgency, hematuria, flank pain, decreased urine volume, vaginal bleeding, vaginal discharge, enuresis, difficulty urinating, genital sores, vaginal pain, menstrual problem and pelvic pain.  Musculoskeletal: Positive for myalgias (D/t fibromyalgia), back pain (D/t fibromyalgia), arthralgias (D/t fibromyalgia and arthritis) and neck pain (D/t fibromyalgia). Negative for joint swelling, gait problem and neck stiffness.  Skin: Negative.   Allergic/Immunologic: Positive for environmental allergies. Negative for food allergies.  Neurological: Positive for dizziness ("If I don't eat"), weakness ("If I don't eat"), light-headedness ("If I don't eat") and numbness (Numbness and tingling in feet at night when she lays down b/l. Dulled senses in right foot "since the chairs fell on me at [place]".). Negative for tremors, seizures, syncope, facial asymmetry, speech difficulty and headaches.       Somtimes  Hematological: Negative for adenopathy. Bruises/bleeds easily.  Psychiatric/Behavioral: Positive for dysphoric mood  (Occasionally wakes up feeling sad, which quickly resolves after praying and finding something to do.). Negative for suicidal ideas, hallucinations, behavioral problems, confusion, sleep disturbance, self-injury, decreased concentration and agitation. The patient is nervous/anxious. The patient is not hyperactive.        Objective:  Vitals: BP 122/77 mmHg  Pulse 73  Temp(Src) 98.7 F (37.1 C) (Oral)  Resp 16  Ht 5' 7"  (1.702 m)  Wt 154 lb 12.8 oz (70.217 kg)  BMI 24.24 kg/m2  SpO2 99%   Physical Exam  Constitutional: She is oriented to person, place, and time. She appears well-developed and well-nourished. She is cooperative. No distress.  HENT:  Head: Normocephalic and atraumatic.  Right Ear: Hearing, tympanic membrane, external ear and ear canal normal.  Left Ear: Hearing, tympanic membrane, external ear and ear canal normal.  Nose: Mucosal edema (slight edema and erythema) and sinus tenderness (tenderness to palpation of left lateral bridge of nose) present. No rhinorrhea, nose lacerations, nasal deformity, septal deviation or nasal septal hematoma. No epistaxis.  No foreign bodies. Right sinus exhibits maxillary sinus tenderness and frontal sinus tenderness. Left sinus exhibits maxillary sinus tenderness and frontal sinus tenderness.    Mouth/Throat: Uvula is midline, oropharynx is clear and moist and mucous membranes are normal.  Eyes: Conjunctivae, EOM and lids are normal. Pupils are equal, round, and reactive to light. Right eye exhibits no discharge. Left eye exhibits no discharge. No scleral icterus.  Fundoscopic exam:      The right eye shows red reflex.       The left eye shows red reflex.  Neck: Trachea normal. Neck supple. Muscular tenderness (She states this is where her fibromyalgia hurts the most) present. No tracheal tenderness and no spinous process tenderness present. No tracheal deviation present. No thyroid mass and no thyromegaly present.  Tender to palpation  everywhere I touched.  Cardiovascular: Normal rate, regular rhythm, normal heart sounds and intact distal pulses.  Exam reveals no gallop and no friction rub.   No murmur heard. Pulmonary/Chest: Effort normal. No  stridor. No respiratory distress. She has no decreased breath sounds. She has wheezes (Only when she coughs.). She has no rhonchi. She has no rales. She exhibits tenderness (tender to palpation of left lateral rib cage, just below the axilla.). She exhibits no mass, no crepitus, no edema, no deformity, no swelling and no retraction. Right breast exhibits tenderness (all over). Right breast exhibits no inverted nipple, no mass, no nipple discharge and no skin change. Left breast exhibits tenderness (all over). Left breast exhibits no inverted nipple, no mass, no nipple discharge and no skin change. Breasts are symmetrical.    Abdominal: Soft. Normal appearance and bowel sounds are normal. She exhibits no distension, no pulsatile midline mass and no mass. There is no hepatosplenomegaly. There is tenderness (Tender to deep palpation of entire abdomen. Tender to superficial palpation of epigastric area.). There is no rebound, no guarding and negative Murphy's sign.  Musculoskeletal:       Right ankle: She exhibits normal range of motion, no swelling, no ecchymosis, no deformity, no laceration and normal pulse. Tenderness (tenderness to palpation of dorsal aspect of foot, just below the ankle).       Left ankle: Normal.       Feet:  Lymphadenopathy:       Head (right side): No submental, no submandibular, no tonsillar, no preauricular, no posterior auricular and no occipital adenopathy present.       Head (left side): No submental, no submandibular, no tonsillar, no preauricular, no posterior auricular and no occipital adenopathy present.    She has no cervical adenopathy.    She has no axillary adenopathy.       Right: No supraclavicular adenopathy present.       Left: No supraclavicular  adenopathy present.  Neurological: She is alert and oriented to person, place, and time. She has normal strength and normal reflexes. A sensory deficit (see diabetes foot exam) is present.  See diabetes foot exam.  Skin: Skin is warm, dry and intact. No rash noted.  Psychiatric: She has a normal mood and affect. Her speech is normal and behavior is normal. Thought content normal.  -Very tender and sensitive throughout entire exam.   Diabetic Foot Exam - Simple   Simple Foot Form  Diabetic Foot exam was performed with the following findings:  Yes 11/13/2014 12:43 PM  Visual Inspection  No deformities, no ulcerations, no other skin breakdown bilaterally:  Yes  Sensation Testing  Intact to touch and monofilament testing bilaterally:  Yes  See comments:  Yes  Pulse Check  Posterior Tibialis and Dorsalis pulse intact bilaterally:  Yes  Comments  Sensation was intact bilaterally, but pt reports sensation was less intense on her right foot. She thinks it is due to her injury to the top of her right foot last week. She also reports paresthesias bilaterally at night sometimes.      Results for orders placed or performed in visit on 11/13/14  POCT glycosylated hemoglobin (Hb A1C)  Result Value Ref Range   Hemoglobin A1C 6.0   POCT glucose (manual entry)  Result Value Ref Range   POC Glucose 87 70 - 99 mg/dl        Assessment & Plan:   Myha was seen today for annual exam and medication refill.  Diagnoses and all orders for this visit:  Annual physical exam   -  See abnormal findings below.   -  F/u in 1 year for next annual exam.  Right foot pain   -  Continue to rest, ice, and elevate. Take OTC Ibuprofen prn.   -  Should continue to improve, return if gets worse.  Abdominal pain, epigastric -  Await lab results -  Do not take Bentyl, pt reported her heart doctor discouraged her use of it. -  Continue eating, stay hydrated, and try to avoid spicy foods and dairy.  -  Return to  clinic or ED for persistent vomiting or if the pain is unbearable. Orders: -     Lipase to look for pancreatitis -     CMP to assess liver and GB -     Ambulatory referral to Gastroenterology for evaluation and colonoscopy (d/t history of colonic polyps)  Vitamin D deficiency and Osteoporosis -  Await lab results to determine if need for vitamin D supplementation. -  Make appointment for bone density scan. Orders: -     Vit D  25 hydroxy (rtn osteoporosis monitoring)  Chronic rhinitis -  Continue regimen. Orders: -     fluticasone (FLONASE) 50 MCG/ACT nasal spray; USE 2 SPRAYS INTO EACH NOSTRIL EVERY DAY  Subacute ethmoidal sinusitis and cough - Take antibiotic as prescribed and finish entire course.  -  Use inhaler prn for wheezing. -  Return if symptoms have not improved after finishing the course of abx.  Orders: -     cefdinir (OMNICEF) 300 MG capsule; Take 2 capsules (600 mg total) by mouth daily. -     benzonatate (TESSALON) 100 MG capsule; Take 1-2 capsules (100-200 mg total) by mouth 3 (three) times daily as needed for cough.  Fibromyalgia   -  Being followed by pain management. No changes in treatment regimen.  Diabetes mellitus type 2, controlled, without complications - POCT glucose and HbA1c WNL. Continue management of DM with lifestyle changes. Will continue to assess for the need for medications at future visits. -  Await lab results Orders: -     Comprehensive metabolic panel to assess kidney function and electrolytes -     Microalbumin, urine -     POCT glycosylated hemoglobin (Hb A1C) -     POCT glucose (manual entry)  Hyperlipidemia -  Await lab results. -  Continue Fenofibrate as prescribed. Orders: -     fenofibrate 160 MG tablet; Take 1 tablet (160 mg total) by mouth daily. -     Lipid panel  Anxiety and depression   -  Well controlled. Continue current regimen and seeing the therapist.  Essential hypertension   -  Well controlled. Continue current  regimen.  Need for pneumococcal vaccination - Pt due for pneumovax, but plan to give 23-valent pneumococcal vaccine at next visit d/t pt feeling ill at today's visit. Orders: -     Cancel: Pneumococcal polysaccharide vaccine 23-valent greater than or equal to 2yo subcutaneous/IM  Psoriasis - Continue current regimen. Orders: -     Desoximetasone (TOPICORT) 0.25 % ointment; Apply 1 application topically 2 (two) times daily as needed (for psoriasis).   Garnet Overfield, PA-S [facility] 11/13/2014 3:19 PM

## 2014-11-14 LAB — VITAMIN D 25 HYDROXY (VIT D DEFICIENCY, FRACTURES): Vit D, 25-Hydroxy: 33 ng/mL (ref 30–100)

## 2014-11-14 LAB — MICROALBUMIN, URINE: Microalb, Ur: 0.7 mg/dL (ref <2.0)

## 2014-11-18 ENCOUNTER — Encounter: Payer: Self-pay | Admitting: Internal Medicine

## 2014-11-19 ENCOUNTER — Other Ambulatory Visit: Payer: Self-pay | Admitting: Gastroenterology

## 2014-11-19 ENCOUNTER — Encounter: Payer: Self-pay | Admitting: Physician Assistant

## 2014-11-19 DIAGNOSIS — E785 Hyperlipidemia, unspecified: Secondary | ICD-10-CM

## 2014-11-19 DIAGNOSIS — R110 Nausea: Secondary | ICD-10-CM

## 2014-11-19 DIAGNOSIS — F32A Depression, unspecified: Secondary | ICD-10-CM

## 2014-11-19 DIAGNOSIS — R1013 Epigastric pain: Secondary | ICD-10-CM

## 2014-11-19 DIAGNOSIS — F329 Major depressive disorder, single episode, unspecified: Secondary | ICD-10-CM

## 2014-11-19 DIAGNOSIS — E559 Vitamin D deficiency, unspecified: Secondary | ICD-10-CM

## 2014-11-19 DIAGNOSIS — F419 Anxiety disorder, unspecified: Principal | ICD-10-CM

## 2014-11-20 ENCOUNTER — Ambulatory Visit (INDEPENDENT_AMBULATORY_CARE_PROVIDER_SITE_OTHER): Payer: 59 | Admitting: Psychiatry

## 2014-11-20 DIAGNOSIS — F331 Major depressive disorder, recurrent, moderate: Secondary | ICD-10-CM

## 2014-11-21 ENCOUNTER — Encounter: Payer: Self-pay | Admitting: Family Medicine

## 2014-11-24 MED ORDER — VITAMIN D 1000 UNITS PO TABS: 1000.0000 [IU] | ORAL_TABLET | Freq: Every day | ORAL | Status: DC

## 2014-11-24 MED ORDER — PRAVASTATIN SODIUM 20 MG PO TABS: 20.0000 mg | ORAL_TABLET | Freq: Every day | ORAL | Status: DC

## 2014-11-24 MED ORDER — ALPRAZOLAM 1 MG PO TABS
ORAL_TABLET | ORAL | Status: DC
Start: 2014-11-24 — End: 2015-01-07

## 2014-11-24 NOTE — Telephone Encounter (Signed)
Alprazolam printed. Please call/fax to pharmacy.  Meds ordered this encounter  Medications  . ALPRAZolam (XANAX) 1 MG tablet    Sig: TAKE 1 TABLET TWICE A DAY AS NEEDED FOR ANXIETY    Dispense:  60 tablet    Refill:  0    Not to exceed 5 additional fills before 11/03/2014.    Order Specific Question:  Supervising Provider    Answer:  DOOLITTLE, ROBERT P [[id number]]  . cholecalciferol (VITAMIN D) 1000 UNITS tablet    Sig: Take 1 tablet (1,000 Units total) by mouth daily.    Dispense:  90 tablet    Refill:  3    Order Specific Question:  Supervising Provider    Answer:  DOOLITTLE, ROBERT P [[id number]]  . pravastatin (PRAVACHOL) 20 MG tablet    Sig: Take 1 tablet (20 mg total) by mouth daily.    Dispense:  90 tablet    Refill:  3    Order Specific Question:  Supervising Provider    Answer:  DOOLITTLE, ROBERT P [[id number]]

## 2014-11-25 NOTE — Telephone Encounter (Signed)
Faxed alprazolam.

## 2014-12-17 ENCOUNTER — Encounter (HOSPITAL_COMMUNITY): Admission: RE | Admit: 2014-12-17 | Payer: Medicare Other | Source: Ambulatory Visit

## 2014-12-17 ENCOUNTER — Ambulatory Visit (HOSPITAL_COMMUNITY)
Admission: RE | Admit: 2014-12-17 | Discharge: 2014-12-17 | Disposition: A | Discharge status: Home / Self Care | Payer: Medicare Other | Source: Ambulatory Visit | Attending: Gastroenterology | Admitting: Gastroenterology

## 2014-12-17 DIAGNOSIS — R1013 Epigastric pain: Secondary | ICD-10-CM | POA: Diagnosis present

## 2014-12-17 DIAGNOSIS — K760 Fatty (change of) liver, not elsewhere classified: Secondary | ICD-10-CM | POA: Insufficient documentation

## 2014-12-17 DIAGNOSIS — R110 Nausea: Secondary | ICD-10-CM

## 2014-12-17 DIAGNOSIS — K8020 Calculus of gallbladder without cholecystitis without obstruction: Secondary | ICD-10-CM | POA: Insufficient documentation

## 2014-12-18 ENCOUNTER — Ambulatory Visit (INDEPENDENT_AMBULATORY_CARE_PROVIDER_SITE_OTHER): Payer: 59 | Admitting: Psychiatry

## 2014-12-18 DIAGNOSIS — F331 Major depressive disorder, recurrent, moderate: Secondary | ICD-10-CM | POA: Diagnosis not present

## 2014-12-24 ENCOUNTER — Other Ambulatory Visit: Payer: Self-pay | Admitting: Gynecology

## 2014-12-25 LAB — CYTOLOGY - PAP

## 2014-12-29 ENCOUNTER — Other Ambulatory Visit: Payer: Self-pay | Admitting: Physician Assistant

## 2015-01-07 ENCOUNTER — Other Ambulatory Visit: Payer: Self-pay | Admitting: Physician Assistant

## 2015-01-07 ENCOUNTER — Encounter: Payer: Self-pay | Admitting: Physician Assistant

## 2015-01-07 DIAGNOSIS — F329 Major depressive disorder, single episode, unspecified: Secondary | ICD-10-CM

## 2015-01-07 DIAGNOSIS — F32A Depression, unspecified: Secondary | ICD-10-CM

## 2015-01-07 DIAGNOSIS — F411 Generalized anxiety disorder: Secondary | ICD-10-CM

## 2015-01-07 DIAGNOSIS — F5104 Psychophysiologic insomnia: Secondary | ICD-10-CM

## 2015-01-07 DIAGNOSIS — F419 Anxiety disorder, unspecified: Principal | ICD-10-CM

## 2015-01-07 MED ORDER — ALPRAZOLAM 1 MG PO TABS: ORAL_TABLET | ORAL | Status: DC

## 2015-01-07 NOTE — Telephone Encounter (Signed)
Patient notified via My Chart.  Meds ordered this encounter  Medications  . ALPRAZolam (XANAX) 1 MG tablet    Sig: TAKE 1 TABLET TWICE A DAY AS NEEDED FOR ANXIETY    Dispense:  60 tablet    Refill:  0    Not to exceed 5 additional fills before 11/03/2014.    Order Specific Question:  Supervising Provider    Answer:  Tami Lin P [[id number]]

## 2015-01-14 ENCOUNTER — Ambulatory Visit: Payer: 59 | Admitting: Psychiatry

## 2015-01-29 ENCOUNTER — Other Ambulatory Visit: Payer: Self-pay | Admitting: Cardiovascular Disease

## 2015-01-29 ENCOUNTER — Ambulatory Visit (INDEPENDENT_AMBULATORY_CARE_PROVIDER_SITE_OTHER): Payer: 59 | Admitting: Psychiatry

## 2015-01-29 ENCOUNTER — Ambulatory Visit (HOSPITAL_COMMUNITY)
Admission: RE | Admit: 2015-01-29 | Discharge: 2015-01-29 | Disposition: A | Discharge status: Home / Self Care | Payer: Medicare Other | Source: Ambulatory Visit | Attending: Cardiovascular Disease | Admitting: Cardiovascular Disease

## 2015-01-29 DIAGNOSIS — Z950 Presence of cardiac pacemaker: Secondary | ICD-10-CM | POA: Diagnosis not present

## 2015-01-29 DIAGNOSIS — Z0000 Encounter for general adult medical examination without abnormal findings: Secondary | ICD-10-CM

## 2015-01-29 DIAGNOSIS — R079 Chest pain, unspecified: Secondary | ICD-10-CM | POA: Insufficient documentation

## 2015-01-29 DIAGNOSIS — F331 Major depressive disorder, recurrent, moderate: Secondary | ICD-10-CM | POA: Diagnosis not present

## 2015-02-06 ENCOUNTER — Telehealth: Payer: Self-pay

## 2015-02-06 NOTE — Telephone Encounter (Signed)
Patient wants to know if she can get a higher dosage of lexipro. She states that she's been taking 1.5 pills and it makes her feel better. She also wants [name] to know the she's having Surgery on the 12th for gallbladder removal.  Pharmacy is [facility] on [address]

## 2015-02-10 MED ORDER — ESCITALOPRAM OXALATE 20 MG PO TABS: 20.0000 mg | ORAL_TABLET | Freq: Every day | ORAL | Status: DC

## 2015-02-10 NOTE — Telephone Encounter (Signed)
Meds ordered this encounter  Medications  . escitalopram (LEXAPRO) 20 MG tablet    Sig: Take 1 tablet (20 mg total) by mouth daily.    Dispense:  90 tablet    Refill:  30    Order Specific Question:  Supervising Provider    Answer:  Tami Lin P [[id number]]

## 2015-02-10 NOTE — Telephone Encounter (Signed)
Spoke with pt, she would like to increase to 20 mg.

## 2015-02-10 NOTE — Patient Instructions (Addendum)
LYNSEY ANGE  02/10/2015   Your procedure is scheduled on: Monday 02/16/2015  Report to [facility] Main  Entrance take [place]  elevators to 3rd floor to  [place] at  0535 AM.  Call this number if you have problems the morning of surgery [phone]   Remember: ONLY 1 PERSON MAY GO WITH YOU TO SHORT STAY TO GET  READY MORNING OF [place].  Do not eat food or drink liquids :After Midnight.     Take these medicines the morning of surgery with A SIP OF WATER: Edarbi, Singulair, Lexapro, Dexilant if needed, use  Flonase nasal spray, Qvar inhaler, Atrovent inhaler, Albuterol inhaler if needed and bring inhalers with you to hospital                               You may not have any metal on your body including hair pins and              piercings  Do not wear jewelry, make-up, lotions, powders or perfumes, deodorant             Do not wear nail polish.  Do not shave  48 hours prior to surgery.              Men may shave face and neck.   Do not bring valuables to the hospital. [place].  Contacts, dentures or bridgework may not be worn into surgery.  Leave suitcase in the car. After surgery it may be brought to your room.     Patients discharged the day of surgery will not be allowed to drive home.  Name and phone number of your driver:  Special Instructions: N/A              Please read over the following fact sheets you were given: _____________________________________________________________________             [facility] - Preparing for Surgery Before surgery, you can play an important role.  Because skin is not sterile, your skin needs to be as free of germs as possible.  You can reduce the number of germs on your skin by washing with CHG (chlorahexidine gluconate) soap before surgery.  CHG is an antiseptic cleaner which kills germs and bonds with the skin to continue killing germs even after  washing. Please DO NOT use if you have an allergy to CHG or antibacterial soaps.  If your skin becomes reddened/irritated stop using the CHG and inform your nurse when you arrive at Short Stay. Do not shave (including legs and underarms) for at least 48 hours prior to the first CHG shower.  You may shave your face/neck. Please follow these instructions carefully:  1.  Shower with CHG Soap the night before surgery and the  morning of Surgery.  2.  If you choose to wash your hair, wash your hair first as usual with your  normal  shampoo.  3.  After you shampoo, rinse your hair and body thoroughly to remove the  shampoo.                           4.  Use CHG as you would any other liquid  soap.  You can apply chg directly  to the skin and wash                       Gently with a scrungie or clean washcloth.  5.  Apply the CHG Soap to your body ONLY FROM THE NECK DOWN.   Do not use on face/ open                           Wound or open sores. Avoid contact with eyes, ears mouth and genitals (private parts).                       Wash face,  Genitals (private parts) with your normal soap.             6.  Wash thoroughly, paying special attention to the area where your surgery  will be performed.  7.  Thoroughly rinse your body with warm water from the neck down.  8.  DO NOT shower/wash with your normal soap after using and rinsing off  the CHG Soap.                9.  Pat yourself dry with a clean towel.            10.  Wear clean pajamas.            11.  Place clean sheets on your bed the night of your first shower and do not  sleep with pets. Day of Surgery : Do not apply any lotions/deodorants the morning of surgery.  Please wear clean clothes to the hospital/surgery center.  FAILURE TO FOLLOW THESE INSTRUCTIONS MAY RESULT IN THE CANCELLATION OF YOUR SURGERY PATIENT SIGNATURE_________________________________  NURSE  SIGNATURE__________________________________  ________________________________________________________________________   Adam Phenix  An incentive spirometer is a tool that can help keep your lungs clear and active. This tool measures how well you are filling your lungs with each breath. Taking long deep breaths may help reverse or decrease the chance of developing breathing (pulmonary) problems (especially infection) following:  A long period of time when you are unable to move or be active. BEFORE THE PROCEDURE   If the spirometer includes an indicator to show your best effort, your nurse or respiratory therapist will set it to a desired goal.  If possible, sit up straight or lean slightly forward. Try not to slouch.  Hold the incentive spirometer in an upright position. INSTRUCTIONS FOR USE  1. Sit on the edge of your bed if possible, or sit up as far as you can in bed or on a chair. 2. Hold the incentive spirometer in an upright position. 3. Breathe out normally. 4. Place the mouthpiece in your mouth and seal your lips tightly around it. 5. Breathe in slowly and as deeply as possible, raising the piston or the ball toward the top of the column. 6. Hold your breath for 3-5 seconds or for as long as possible. Allow the piston or ball to fall to the bottom of the column. 7. Remove the mouthpiece from your mouth and breathe out normally. 8. Rest for a few seconds and repeat Steps 1 through 7 at least 10 times every 1-2 hours when you are awake. Take your time and take a few normal breaths between deep breaths. 9. The spirometer may include an indicator to show your best effort. Use the indicator as a goal to  work toward during each repetition. 10. After each set of 10 deep breaths, practice coughing to be sure your lungs are clear. If you have an incision (the cut made at the time of surgery), support your incision when coughing by placing a pillow or rolled up towels firmly  against it. Once you are able to get out of bed, walk around indoors and cough well. You may stop using the incentive spirometer when instructed by your caregiver.  RISKS AND COMPLICATIONS  Take your time so you do not get dizzy or light-headed.  If you are in pain, you may need to take or ask for pain medication before doing incentive spirometry. It is harder to take a deep breath if you are having pain. AFTER USE  Rest and breathe slowly and easily.  It can be helpful to keep track of a log of your progress. Your caregiver can provide you with a simple table to help with this. If you are using the spirometer at home, follow these instructions: [place] IF:   You are having difficultly using the spirometer.  You have trouble using the spirometer as often as instructed.  Your pain medication is not giving enough relief while using the spirometer.  You develop fever of 100.5 F (38.1 C) or higher. SEEK IMMEDIATE MEDICAL CARE IF:   You cough up bloody sputum that had not been present before.  You develop fever of 102 F (38.9 C) or greater.  You develop worsening pain at or near the incision site. MAKE SURE YOU:   Understand these instructions.  Will watch your condition.  Will get help right away if you are not doing well or get worse. Document Released: 10/03/2006 Document Revised: 08/15/2011 Document Reviewed: 12/04/2006 [facility] Patient Information 2014 [place], [place].   ________________________________________________________________________

## 2015-02-10 NOTE — Telephone Encounter (Signed)
Yes. She can increase the Lexapro up to 20 mg.  If she feels good at 15 mg (1.5 of the 10 mg tablets), then I recommend that she continue that dose. If she feels like she needs to increase to 20 mg, that's ok too.  Let me know so that I can send in a new prescription with the correct instructions.

## 2015-02-11 ENCOUNTER — Ambulatory Visit (INDEPENDENT_AMBULATORY_CARE_PROVIDER_SITE_OTHER): Payer: Medicare Other | Admitting: *Deleted

## 2015-02-11 DIAGNOSIS — I495 Sick sinus syndrome: Secondary | ICD-10-CM | POA: Diagnosis not present

## 2015-02-11 NOTE — Progress Notes (Signed)
Remote pacemaker transmission.   

## 2015-02-12 ENCOUNTER — Encounter (HOSPITAL_COMMUNITY)
Admission: RE | Admit: 2015-02-12 | Discharge: 2015-02-12 | Disposition: A | Discharge status: Home / Self Care | Payer: Medicare Other | Source: Ambulatory Visit | Attending: Surgery | Admitting: Surgery

## 2015-02-12 ENCOUNTER — Encounter (HOSPITAL_COMMUNITY): Payer: Self-pay

## 2015-02-12 DIAGNOSIS — Z01812 Encounter for preprocedural laboratory examination: Secondary | ICD-10-CM | POA: Insufficient documentation

## 2015-02-12 DIAGNOSIS — K8080 Other cholelithiasis without obstruction: Secondary | ICD-10-CM | POA: Diagnosis not present

## 2015-02-12 HISTORY — DX: Presence of cardiac pacemaker: Z95.0

## 2015-02-12 LAB — CBC
HEMATOCRIT: 38.6 % (ref 36.0–46.0)
Hemoglobin: 12.8 g/dL (ref 12.0–15.0)
MCH: 28.3 pg (ref 26.0–34.0)
MCHC: 33.2 g/dL (ref 30.0–36.0)
MCV: 85.2 fL (ref 78.0–100.0)
Platelets: 326 10*3/uL (ref 150–400)
RBC: 4.53 MIL/uL (ref 3.87–5.11)
RDW: 13.4 % (ref 11.5–15.5)
WBC: 7.5 10*3/uL (ref 4.0–10.5)

## 2015-02-12 LAB — BASIC METABOLIC PANEL
Anion gap: 6 (ref 5–15)
BUN: 11 mg/dL (ref 6–20)
CO2: 30 mmol/L (ref 22–32)
CREATININE: 0.69 mg/dL (ref 0.44–1.00)
Calcium: 9.3 mg/dL (ref 8.9–10.3)
Chloride: 105 mmol/L (ref 101–111)
GFR calc Af Amer: >60 mL/min (ref >60)
GFR calc non Af Amer: >60 mL/min (ref >60)
Glucose, Bld: 95 mg/dL (ref 65–99)
Potassium: 4.5 mmol/L (ref 3.5–5.1)
Sodium: 141 mmol/L (ref 135–145)

## 2015-02-12 NOTE — Progress Notes (Signed)
Patient called and informed not to take Edarbi  medication am of surgery per Anesthesia as it is a [employer] drug.

## 2015-02-12 NOTE — Progress Notes (Signed)
11/12/2014-Noted in EPIC EKG, and last device check by Dr. Rayann Heman. 01/29/2015- noted CXR in EPIC

## 2015-02-13 ENCOUNTER — Ambulatory Visit: Payer: Self-pay | Admitting: Surgery

## 2015-02-15 DIAGNOSIS — K8020 Calculus of gallbladder without cholecystitis without obstruction: Secondary | ICD-10-CM

## 2015-02-15 HISTORY — DX: Calculus of gallbladder without cholecystitis without obstruction: K80.20

## 2015-02-15 NOTE — H&P (Signed)
Chief Complaint:  gallstones  History of Present Illness:  [name] is an 59 y.o. female well known to me who was referred back for cholecystectomy after gallstones were discovered after she was worked up for abdominal pain by Dr. Collene Mares.  Informed consent was obtained in the office regarding lalp and open cholecystectomy.    Past Medical History  Diagnosis Date  . GERD (gastroesophageal reflux disease)   . Arthritis   . Anxiety   . Fibromyalgia   . Bursitis     right hip  . Anal fissure 02/2012  . Hypertension     under control, has been on med. since 2010  . Tarsal tunnel syndrome of left side   . Complication of anesthesia     difficult to wake up post-op  . Depression   . Asthma     daily and prn inhalers  . Irritable bowel syndrome (IBS)   . TMJ (temporomandibular joint syndrome)     left  . Hyperlipidemia   . Pneumonia ~ 06/2012  . Chronic bronchitis     "get it about q year" (12/27/2012)  . Tarsal tunnel syndrome of left side   . Hepatitis A infection 1979  . 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 Allergy   . Diabetes mellitus without complication   . Presence of permanent cardiac pacemaker     implanted 07/08/2014    Past Surgical History  Procedure Laterality Date  . Shoulder arthroscopy w/ rotator cuff repair Left 1999  . Bilateral salpingoophorectomy Bilateral 04/2011  . Cystoscopy w/ ureteral stent placement  08/04/2009  . Laparoscopic lysis intestinal adhesions  08/04/2009  . Cardiac catheterization  10/08/2007  . Examination under anesthesia  02/17/2012    Procedure: EXAM UNDER ANESTHESIA;  Surgeon: Pedro Earls, MD;  Location: [facility];  Service: General;  Laterality: N/A;  Exam under anesthesia with lateral internal sphincterotomy  . Sphincterotomy  02/17/2012    Procedure: SPHINCTEROTOMY;  Surgeon: Pedro Earls, MD;  Location: [facility];  Service: General;   Laterality: N/A;  . Abdominal hysterectomy  09/1994    partial  . Appendectomy    . Cesarean section    . Small intestine surgery    . Pacemaker insertion  07/08/14    MDT MRI compatible dual chamber pacemaker implanted by Dr Rayann Heman for symptomatic bradycardia  . Permanent pacemaker insertion N/A 07/08/2014    Procedure: PERMANENT PACEMAKER INSERTION;  Surgeon: [name], MD;  Location: [facility] CATH LAB;  Service: Cardiovascular;  Laterality: N/A;  . Hemorrhoid surgery      No current facility-administered medications for this encounter.   Current Outpatient Prescriptions  Medication Sig Dispense Refill  . albuterol (PROVENTIL HFA;VENTOLIN HFA) 108 (90 BASE) MCG/ACT inhaler Inhale 2 puffs into the lungs every 6 (six) hours as needed for wheezing. 18 g 2  . ALPRAZolam (XANAX) 1 MG tablet TAKE 1 TABLET TWICE A DAY AS NEEDED FOR ANXIETY (Patient taking differently: Take 1 mg by mouth at bedtime. ) 60 tablet 0  . Azilsartan Medoxomil (EDARBI) 40 MG TABS Take 20 mg by mouth every morning.    . beclomethasone (QVAR) 40 MCG/ACT inhaler Inhale 1-2 puffs into the lungs 2 (two) times daily. 1 Inhaler 12  . cholecalciferol (VITAMIN D) 1000 UNITS tablet Take 1 tablet (1,000 Units total) by mouth daily. 90 tablet 3  . cyclobenzaprine (FLEXERIL) 10 MG tablet Take 10 mg  by mouth 3 (three) times daily as needed for muscle spasms.    . Desoximetasone (TOPICORT) 0.25 % ointment Apply 1 application topically 2 (two) times daily as needed (for psoriasis). 30 g 6  . dexlansoprazole (DEXILANT) 60 MG capsule Take 60 mg by mouth daily as needed (for acid reflux).    . diazepam (VALIUM) 5 MG tablet Take 1 tablet (5 mg total) by mouth daily. (Patient taking differently: Take 5 mg by mouth daily as needed for anxiety or muscle spasms. ) 30 tablet 0  . docusate sodium (COLACE) 100 MG capsule TAKE 1 CAPSULE (100 MG TOTAL) BY MOUTH DAILY AS NEEDED FOR MILD CONSTIPATION. 30 capsule 0  . escitalopram (LEXAPRO) 20 MG tablet Take  1 tablet (20 mg total) by mouth daily. 90 tablet 30  . fenofibrate 160 MG tablet Take 1 tablet (160 mg total) by mouth daily. (Patient taking differently: Take 160 mg by mouth at bedtime. ) 90 tablet 3  . fluticasone (FLONASE) 50 MCG/ACT nasal spray USE 2 SPRAYS INTO EACH NOSTRIL EVERY DAY 16 g 2  . furosemide (LASIX) 20 MG tablet Take 1 tablet (20 mg total) by mouth daily as needed (leg swelling). 30 tablet 3  . ipratropium (ATROVENT) 0.03 % nasal spray USE 2 SPRAYS INTO THE NOSE EVERY 12 (TWELVE) HOURS. 30 mL 9  . meloxicam (MOBIC) 15 MG tablet TAKE 1 TABLET (15 MG TOTAL) BY MOUTH DAILY. 30 tablet 1  . metaxalone (SKELAXIN) 800 MG tablet Take 400-800 mg by mouth daily as needed for pain (for muscle spasms).     . montelukast (SINGULAIR) 10 MG tablet Take 1 tablet (10 mg total) by mouth daily. 90 tablet 3  . Multiple Vitamin (MULTIVITAMIN) tablet Take 1 tablet by mouth daily.    Marland Kitchen NITROSTAT 0.4 MG SL tablet Place 0.4 mg under the tongue every 5 (five) minutes as needed for chest pain. For chest pain.    Marland Kitchen oxyCODONE-acetaminophen (PERCOCET) 10-325 MG per tablet Take 10-325 tablets by mouth every 6 (six) hours as needed for pain.     . pravastatin (PRAVACHOL) 20 MG tablet Take 1 tablet (20 mg total) by mouth daily. 90 tablet 3  . Probiotic Product (PROBIOTIC DAILY) CAPS Take 1 capsule by mouth 2 (two) times a week.    . ranitidine (ZANTAC) 150 MG tablet Take 150 mg by mouth as needed for heartburn.     . zolpidem (AMBIEN) 10 MG tablet Take 10 mg by mouth at bedtime as needed for sleep.     . benzonatate (TESSALON) 100 MG capsule Take 1-2 capsules (100-200 mg total) by mouth 3 (three) times daily as needed for cough. (Patient not taking: Reported on 02/12/2015) 40 capsule 0  . hydrOXYzine (ATARAX/VISTARIL) 25 MG tablet Take 0.5-2 tablets (12.5-50 mg total) by mouth at bedtime as needed for anxiety (insomnia). (Patient not taking: Reported on 02/12/2015) 60 tablet 2   Codeine; Propoxyphene  n-acetaminophen; Sulfonamide derivatives; Alka-seltzer; Norvasc; Iohexol; and Latex Family History  Problem Relation Age of Onset  . Cancer Mother     liver  . Diabetes Mother   . Hyperlipidemia Mother   . Hypertension Mother   . Stroke Mother   . Cancer Maternal Grandmother     breast  . Anesthesia problems Sister     hard to wake up post-op  . Heart disease Father   . Hyperlipidemia Brother   . HIV/AIDS Brother   . Cancer Maternal Grandfather     colon  . Diabetes Brother   .  Cancer Brother     lung  . Cancer Paternal Grandfather     COLON   Social History:   reports that she quit smoking about 2 years ago. Her smoking use included Cigarettes. She smoked 0.00 packs per day for 37 years. She has never used smokeless tobacco. She reports that she does not drink alcohol or use illicit drugs.   REVIEW OF SYSTEMS : Negative except for see problem list  Physical Exam:   There were no vitals taken for this visit. There is no weight on file to calculate BMI.  Gen:  WDWN AA/NA female NAD  Neurological: Alert and oriented to person, place, and time. Motor and sensory function is grossly intact  Head: Normocephalic and atraumatic.  Eyes: Conjunctivae are normal. Pupils are equal, round, and reactive to light. No scleral icterus.  Neck: Normal range of motion. Neck supple. No tracheal deviation or thyromegaly present.  Cardiovascular:  SR without murmurs or gallops.  No carotid bruits Breast:  Not examined Respiratory: Effort normal.  No respiratory distress. No chest wall tenderness. Breath sounds normal.  No wheezes, rales or rhonchi.  Abdomen:  nontender at present GU:  Not examined Musculoskeletal: Normal range of motion. Extremities are nontender. No cyanosis, edema or clubbing noted Lymphadenopathy: No cervical, preauricular, postauricular or axillary adenopathy is present Skin: Skin is warm and dry. No rash noted. No diaphoresis. No erythema. No pallor. Pscyh: Normal mood  and affect. Behavior is normal. Judgment and thought content normal.   LABORATORY RESULTS: No results found for this or any previous visit (from the past 48 hour(s)).   RADIOLOGY RESULTS: No results found.  Problem List: Patient Active Problem List   Diagnosis Date Noted  . Gallstones 02/15/2015  . Right foot pain 11/13/2014  . Ethmoid sinusitis 11/13/2014  . Abdominal pain, epigastric 11/13/2014  . Vitamin D deficiency 11/13/2014  . 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 Sick sinus syndrome 07/08/2014  . Chest pain at rest 07/05/2014  . Chronic rhinitis 10/22/2013  . Fatty liver 10/22/2013  . Interstitial cystitis 10/22/2013  . Hip pain, right 10/22/2013  . Chronic insomnia 09/25/2012  . HTN (hypertension) 09/25/2012  . Hyperlipidemia 09/25/2012  . Diabetes mellitus type 2, controlled, without complications 61/60/7371  . GERD 12/04/2008  . History of colonic polyps 12/04/2008  . CONSTIPATION 10/21/2008  . Fibromyalgia 10/21/2008  . LACTOSE INTOLERANCE 11/20/2007  . CHEST PAIN 11/20/2007  . Anxiety and depression 08/02/2007  . Osteoporosis 08/02/2007  . DIVERTICULOSIS, COLON 11/16/2006  . EXTERNAL HEMORRHOIDS 05/12/2005    Assessment & Plan: Chronic cholecystitis; for lap chole    Matt B. Hassell Done, MD, [facility] [phone] beeper [phone]  02/15/2015 9:03 PM

## 2015-02-16 ENCOUNTER — Encounter (HOSPITAL_COMMUNITY)
Admission: RE | Disposition: A | Discharge status: Home / Self Care | Payer: Self-pay | Source: Ambulatory Visit | Attending: Surgery

## 2015-02-16 ENCOUNTER — Ambulatory Visit (HOSPITAL_COMMUNITY): Payer: Medicare Other | Admitting: Anesthesiology

## 2015-02-16 ENCOUNTER — Ambulatory Visit (HOSPITAL_COMMUNITY): Payer: Medicare Other

## 2015-02-16 ENCOUNTER — Encounter (HOSPITAL_COMMUNITY): Payer: Self-pay | Admitting: Anesthesiology

## 2015-02-16 ENCOUNTER — Observation Stay (HOSPITAL_COMMUNITY)
Admission: RE | Admit: 2015-02-16 | Discharge: 2015-02-18 | Disposition: A | Discharge status: Home / Self Care | Payer: Medicare Other | Source: Ambulatory Visit | Attending: Surgery | Admitting: Surgery

## 2015-02-16 DIAGNOSIS — E559 Vitamin D deficiency, unspecified: Secondary | ICD-10-CM | POA: Insufficient documentation

## 2015-02-16 DIAGNOSIS — K219 Gastro-esophageal reflux disease without esophagitis: Secondary | ICD-10-CM | POA: Diagnosis not present

## 2015-02-16 DIAGNOSIS — Z7951 Long term (current) use of inhaled steroids: Secondary | ICD-10-CM | POA: Insufficient documentation

## 2015-02-16 DIAGNOSIS — Z791 Long term (current) use of non-steroidal anti-inflammatories (NSAID): Secondary | ICD-10-CM | POA: Insufficient documentation

## 2015-02-16 DIAGNOSIS — M1990 Unspecified osteoarthritis, unspecified site: Secondary | ICD-10-CM | POA: Insufficient documentation

## 2015-02-16 DIAGNOSIS — Z79899 Other long term (current) drug therapy: Secondary | ICD-10-CM | POA: Diagnosis not present

## 2015-02-16 DIAGNOSIS — F419 Anxiety disorder, unspecified: Secondary | ICD-10-CM | POA: Insufficient documentation

## 2015-02-16 DIAGNOSIS — K5900 Constipation, unspecified: Secondary | ICD-10-CM | POA: Insufficient documentation

## 2015-02-16 DIAGNOSIS — F329 Major depressive disorder, single episode, unspecified: Secondary | ICD-10-CM | POA: Diagnosis not present

## 2015-02-16 DIAGNOSIS — K8010 Calculus of gallbladder with chronic cholecystitis without obstruction: Secondary | ICD-10-CM | POA: Diagnosis present

## 2015-02-16 DIAGNOSIS — F5104 Psychophysiologic insomnia: Secondary | ICD-10-CM | POA: Diagnosis not present

## 2015-02-16 DIAGNOSIS — E785 Hyperlipidemia, unspecified: Secondary | ICD-10-CM | POA: Diagnosis not present

## 2015-02-16 DIAGNOSIS — Z87891 Personal history of nicotine dependence: Secondary | ICD-10-CM | POA: Insufficient documentation

## 2015-02-16 DIAGNOSIS — J45909 Unspecified asthma, uncomplicated: Secondary | ICD-10-CM | POA: Diagnosis not present

## 2015-02-16 DIAGNOSIS — G709 Myoneural disorder, unspecified: Secondary | ICD-10-CM | POA: Diagnosis not present

## 2015-02-16 DIAGNOSIS — K819 Cholecystitis, unspecified: Secondary | ICD-10-CM | POA: Diagnosis present

## 2015-02-16 DIAGNOSIS — I10 Essential (primary) hypertension: Secondary | ICD-10-CM | POA: Diagnosis not present

## 2015-02-16 DIAGNOSIS — Z950 Presence of cardiac pacemaker: Secondary | ICD-10-CM | POA: Insufficient documentation

## 2015-02-16 DIAGNOSIS — M797 Fibromyalgia: Secondary | ICD-10-CM | POA: Diagnosis not present

## 2015-02-16 DIAGNOSIS — E119 Type 2 diabetes mellitus without complications: Secondary | ICD-10-CM | POA: Insufficient documentation

## 2015-02-16 DIAGNOSIS — K8020 Calculus of gallbladder without cholecystitis without obstruction: Secondary | ICD-10-CM

## 2015-02-16 DIAGNOSIS — Z419 Encounter for procedure for purposes other than remedying health state, unspecified: Secondary | ICD-10-CM

## 2015-02-16 HISTORY — PX: CHOLECYSTECTOMY: SHX55

## 2015-02-16 LAB — CREATININE, SERUM
CREATININE: 0.62 mg/dL (ref 0.44–1.00)
GFR calc non Af Amer: >60 mL/min (ref >60)

## 2015-02-16 LAB — CBC
HCT: 34.2 % — ABNORMAL LOW (ref 36.0–46.0)
HEMOGLOBIN: 11 g/dL — AB (ref 12.0–15.0)
MCH: 27.5 pg (ref 26.0–34.0)
MCHC: 32.2 g/dL (ref 30.0–36.0)
MCV: 85.5 fL (ref 78.0–100.0)
PLATELETS: 255 10*3/uL (ref 150–400)
RBC: 4 MIL/uL (ref 3.87–5.11)
RDW: 13.1 % (ref 11.5–15.5)
WBC: 11.3 10*3/uL — AB (ref 4.0–10.5)

## 2015-02-16 LAB — GLUCOSE, CAPILLARY: Glucose-Capillary: 129 mg/dL — ABNORMAL HIGH (ref 65–99)

## 2015-02-16 SURGERY — LAPAROSCOPIC CHOLECYSTECTOMY WITH INTRAOPERATIVE CHOLANGIOGRAM
Anesthesia: General | Site: Abdomen

## 2015-02-16 MED ORDER — CHLORHEXIDINE GLUCONATE 4 % EX LIQD: 1.0000 "application " | Freq: Once | CUTANEOUS | Status: DC

## 2015-02-16 MED ORDER — HYDROMORPHONE HCL 1 MG/ML IJ SOLN
0.5000 mg | INTRAMUSCULAR | Status: DC | PRN
  Administered 2015-02-16 (×4): 0.5 mg via INTRAVENOUS
  Filled 2015-02-16 (×4): qty 1

## 2015-02-16 MED ORDER — OXYCODONE-ACETAMINOPHEN 5-325 MG PO TABS
1.0000 | ORAL_TABLET | ORAL | Status: DC | PRN
  Administered 2015-02-17 – 2015-02-18 (×2): 1 via ORAL
  Filled 2015-02-16 (×3): qty 1

## 2015-02-16 MED ORDER — TRIAMCINOLONE ACETONIDE 0.5 % EX OINT: TOPICAL_OINTMENT | Freq: Two times a day (BID) | CUTANEOUS | Status: DC | PRN

## 2015-02-16 MED ORDER — BUPIVACAINE LIPOSOME 1.3 % IJ SUSP
INTRAMUSCULAR | Status: DC | PRN
  Administered 2015-02-16: 20 mL

## 2015-02-16 MED ORDER — HYDROMORPHONE HCL 2 MG/ML IJ SOLN
INTRAMUSCULAR | Status: AC
  Filled 2015-02-16: qty 1

## 2015-02-16 MED ORDER — ONDANSETRON HCL 4 MG/2ML IJ SOLN
4.0000 mg | Freq: Four times a day (QID) | INTRAMUSCULAR | Status: DC | PRN
  Administered 2015-02-17: 4 mg via INTRAVENOUS
  Filled 2015-02-16: qty 2

## 2015-02-16 MED ORDER — IOHEXOL 300 MG/ML  SOLN
INTRAMUSCULAR | Status: DC | PRN
  Administered 2015-02-16: 50 mL via INTRAVENOUS

## 2015-02-16 MED ORDER — GLYCOPYRROLATE 0.2 MG/ML IJ SOLN
INTRAMUSCULAR | Status: AC
  Filled 2015-02-16: qty 3

## 2015-02-16 MED ORDER — MIDAZOLAM HCL 2 MG/2ML IJ SOLN
INTRAMUSCULAR | Status: AC
  Filled 2015-02-16: qty 4

## 2015-02-16 MED ORDER — DESOXIMETASONE 0.25 % EX OINT: 1.0000 "application " | TOPICAL_OINTMENT | Freq: Two times a day (BID) | CUTANEOUS | Status: DC | PRN

## 2015-02-16 MED ORDER — FENTANYL CITRATE (PF) 100 MCG/2ML IJ SOLN
INTRAMUSCULAR | Status: DC | PRN
  Administered 2015-02-16 (×2): 50 ug via INTRAVENOUS
  Administered 2015-02-16: 100 ug via INTRAVENOUS
  Administered 2015-02-16: 50 ug via INTRAVENOUS

## 2015-02-16 MED ORDER — KCL IN DEXTROSE-NACL 20-5-0.45 MEQ/L-%-% IV SOLN
INTRAVENOUS | Status: DC
  Administered 2015-02-16: 1000 mL via INTRAVENOUS
  Administered 2015-02-17 (×2): via INTRAVENOUS
  Filled 2015-02-16 (×6): qty 1000

## 2015-02-16 MED ORDER — ONDANSETRON 4 MG PO TBDP: 4.0000 mg | ORAL_TABLET | Freq: Four times a day (QID) | ORAL | Status: DC | PRN

## 2015-02-16 MED ORDER — PROPOFOL 10 MG/ML IV BOLUS
INTRAVENOUS | Status: DC | PRN
  Administered 2015-02-16: 150 mg via INTRAVENOUS

## 2015-02-16 MED ORDER — PHENYLEPHRINE HCL 10 MG/ML IJ SOLN
INTRAMUSCULAR | Status: DC | PRN
  Administered 2015-02-16: 40 ug via INTRAVENOUS

## 2015-02-16 MED ORDER — FENTANYL CITRATE (PF) 250 MCG/5ML IJ SOLN
INTRAMUSCULAR | Status: AC
  Filled 2015-02-16: qty 25

## 2015-02-16 MED ORDER — SUCCINYLCHOLINE CHLORIDE 20 MG/ML IJ SOLN
INTRAMUSCULAR | Status: DC | PRN
  Administered 2015-02-16: 100 mg via INTRAVENOUS

## 2015-02-16 MED ORDER — NITROGLYCERIN 0.4 MG SL SUBL: 0.4000 mg | SUBLINGUAL_TABLET | SUBLINGUAL | Status: DC | PRN

## 2015-02-16 MED ORDER — ZOLPIDEM TARTRATE 5 MG PO TABS: 5.0000 mg | ORAL_TABLET | Freq: Every evening | ORAL | Status: DC | PRN

## 2015-02-16 MED ORDER — MONTELUKAST SODIUM 10 MG PO TABS
10.0000 mg | ORAL_TABLET | Freq: Every day | ORAL | Status: DC
  Administered 2015-02-17 – 2015-02-18 (×2): 10 mg via ORAL
  Filled 2015-02-16 (×2): qty 1

## 2015-02-16 MED ORDER — ONDANSETRON HCL 4 MG/2ML IJ SOLN
INTRAMUSCULAR | Status: AC
  Filled 2015-02-16: qty 2

## 2015-02-16 MED ORDER — LACTATED RINGERS IV SOLN: INTRAVENOUS | Status: DC

## 2015-02-16 MED ORDER — BUPIVACAINE LIPOSOME 1.3 % IJ SUSP
20.0000 mL | Freq: Once | INTRAMUSCULAR | Status: DC
  Filled 2015-02-16: qty 20

## 2015-02-16 MED ORDER — HEPARIN SODIUM (PORCINE) 5000 UNIT/ML IJ SOLN
5000.0000 [IU] | Freq: Three times a day (TID) | INTRAMUSCULAR | Status: DC
  Administered 2015-02-16 – 2015-02-18 (×4): 5000 [IU] via SUBCUTANEOUS
  Filled 2015-02-16 (×5): qty 1

## 2015-02-16 MED ORDER — CYCLOBENZAPRINE HCL 10 MG PO TABS: 10.0000 mg | ORAL_TABLET | Freq: Three times a day (TID) | ORAL | Status: DC | PRN

## 2015-02-16 MED ORDER — PROPOFOL 10 MG/ML IV BOLUS
INTRAVENOUS | Status: AC
  Filled 2015-02-16: qty 20

## 2015-02-16 MED ORDER — IPRATROPIUM BROMIDE 0.03 % NA SOLN
1.0000 | Freq: Two times a day (BID) | NASAL | Status: DC
  Administered 2015-02-17 – 2015-02-18 (×2): 1 via NASAL
  Filled 2015-02-16: qty 30

## 2015-02-16 MED ORDER — CEFAZOLIN SODIUM-DEXTROSE 2-3 GM-% IV SOLR
2.0000 g | INTRAVENOUS | Status: AC
  Administered 2015-02-16: 2 g via INTRAVENOUS

## 2015-02-16 MED ORDER — PANTOPRAZOLE SODIUM 40 MG IV SOLR
40.0000 mg | Freq: Every day | INTRAVENOUS | Status: DC
  Administered 2015-02-16 – 2015-02-17 (×2): 40 mg via INTRAVENOUS
  Filled 2015-02-16 (×3): qty 40

## 2015-02-16 MED ORDER — OXYCODONE-ACETAMINOPHEN 10-325 MG PO TABS: 10.0000 | ORAL_TABLET | ORAL | Status: DC | PRN

## 2015-02-16 MED ORDER — FLUTICASONE PROPIONATE 50 MCG/ACT NA SUSP
1.0000 | Freq: Every day | NASAL | Status: DC
  Administered 2015-02-17 – 2015-02-18 (×2): 1 via NASAL
  Filled 2015-02-16: qty 16

## 2015-02-16 MED ORDER — HYDROMORPHONE HCL 1 MG/ML IJ SOLN
INTRAMUSCULAR | Status: DC | PRN
  Administered 2015-02-16: 0.5 mg via INTRAVENOUS

## 2015-02-16 MED ORDER — NEOSTIGMINE METHYLSULFATE 10 MG/10ML IV SOLN
INTRAVENOUS | Status: DC | PRN
  Administered 2015-02-16: 4 mg via INTRAVENOUS

## 2015-02-16 MED ORDER — ALBUTEROL SULFATE (2.5 MG/3ML) 0.083% IN NEBU: 2.5000 mg | INHALATION_SOLUTION | Freq: Four times a day (QID) | RESPIRATORY_TRACT | Status: DC | PRN

## 2015-02-16 MED ORDER — FUROSEMIDE 20 MG PO TABS: 20.0000 mg | ORAL_TABLET | Freq: Every day | ORAL | Status: DC | PRN

## 2015-02-16 MED ORDER — DIAZEPAM 5 MG PO TABS
5.0000 mg | ORAL_TABLET | Freq: Every day | ORAL | Status: DC
  Administered 2015-02-17: 5 mg via ORAL
  Filled 2015-02-16 (×2): qty 1

## 2015-02-16 MED ORDER — ROCURONIUM BROMIDE 100 MG/10ML IV SOLN
INTRAVENOUS | Status: DC | PRN
  Administered 2015-02-16: 30 mg via INTRAVENOUS
  Administered 2015-02-16: 10 mg via INTRAVENOUS

## 2015-02-16 MED ORDER — HEPARIN SODIUM (PORCINE) 5000 UNIT/ML IJ SOLN
5000.0000 [IU] | Freq: Once | INTRAMUSCULAR | Status: AC
  Administered 2015-02-16: 5000 [IU] via SUBCUTANEOUS
  Filled 2015-02-16: qty 1

## 2015-02-16 MED ORDER — FENOFIBRATE 160 MG PO TABS
160.0000 mg | ORAL_TABLET | Freq: Every day | ORAL | Status: DC
  Administered 2015-02-16: 160 mg via ORAL
  Filled 2015-02-16 (×4): qty 1

## 2015-02-16 MED ORDER — LACTATED RINGERS IV SOLN
INTRAVENOUS | Status: DC | PRN
  Administered 2015-02-16: 07:00:00 via INTRAVENOUS

## 2015-02-16 MED ORDER — 0.9 % SODIUM CHLORIDE (POUR BTL) OPTIME
TOPICAL | Status: DC | PRN
  Administered 2015-02-16: 1000 mL

## 2015-02-16 MED ORDER — IRBESARTAN 150 MG PO TABS
300.0000 mg | ORAL_TABLET | Freq: Every day | ORAL | Status: DC
  Administered 2015-02-17 – 2015-02-18 (×2): 300 mg via ORAL
  Filled 2015-02-16 (×2): qty 2

## 2015-02-16 MED ORDER — LIDOCAINE HCL (CARDIAC) 20 MG/ML IV SOLN
INTRAVENOUS | Status: AC
  Filled 2015-02-16: qty 5

## 2015-02-16 MED ORDER — CEFAZOLIN SODIUM-DEXTROSE 2-3 GM-% IV SOLR
INTRAVENOUS | Status: AC
  Filled 2015-02-16: qty 50

## 2015-02-16 MED ORDER — MIDAZOLAM HCL 5 MG/5ML IJ SOLN
INTRAMUSCULAR | Status: DC | PRN
  Administered 2015-02-16: 2 mg via INTRAVENOUS

## 2015-02-16 MED ORDER — ONDANSETRON HCL 4 MG/2ML IJ SOLN
INTRAMUSCULAR | Status: DC | PRN
Start: 2015-02-16 — End: 2015-02-16
  Administered 2015-02-16: 4 mg via INTRAVENOUS

## 2015-02-16 MED ORDER — ALBUTEROL SULFATE HFA 108 (90 BASE) MCG/ACT IN AERS: 2.0000 | INHALATION_SPRAY | Freq: Four times a day (QID) | RESPIRATORY_TRACT | Status: DC | PRN

## 2015-02-16 MED ORDER — LIP MEDEX EX OINT
TOPICAL_OINTMENT | CUTANEOUS | Status: AC
  Filled 2015-02-16: qty 7

## 2015-02-16 MED ORDER — CEFAZOLIN SODIUM-DEXTROSE 2-3 GM-% IV SOLR
2.0000 g | Freq: Three times a day (TID) | INTRAVENOUS | Status: AC
  Administered 2015-02-16: 2 g via INTRAVENOUS
  Filled 2015-02-16 (×2): qty 50

## 2015-02-16 MED ORDER — NEOSTIGMINE METHYLSULFATE 10 MG/10ML IV SOLN
INTRAVENOUS | Status: AC
  Filled 2015-02-16: qty 1

## 2015-02-16 MED ORDER — ESCITALOPRAM OXALATE 10 MG PO TABS
20.0000 mg | ORAL_TABLET | Freq: Every day | ORAL | Status: DC
  Administered 2015-02-17 – 2015-02-18 (×2): 20 mg via ORAL
  Filled 2015-02-16 (×2): qty 2

## 2015-02-16 MED ORDER — GLYCOPYRROLATE 0.2 MG/ML IJ SOLN
INTRAMUSCULAR | Status: DC | PRN
  Administered 2015-02-16: .6 mg via INTRAVENOUS

## 2015-02-16 MED ORDER — HYDROMORPHONE HCL 1 MG/ML IJ SOLN
INTRAMUSCULAR | Status: AC
  Filled 2015-02-16: qty 1

## 2015-02-16 MED ORDER — METAXALONE 400 MG HALF TABLET
400.0000 mg | ORAL_TABLET | Freq: Every day | ORAL | Status: DC | PRN
  Filled 2015-02-16: qty 2

## 2015-02-16 MED ORDER — LACTATED RINGERS IV SOLN
INTRAVENOUS | Status: DC
  Administered 2015-02-16: 10:00:00 via INTRAVENOUS

## 2015-02-16 MED ORDER — LIDOCAINE HCL (CARDIAC) 20 MG/ML IV SOLN
INTRAVENOUS | Status: DC | PRN
  Administered 2015-02-16: 50 mg via INTRAVENOUS

## 2015-02-16 MED ORDER — LACTATED RINGERS IR SOLN
Status: DC | PRN
  Administered 2015-02-16: 1000 mL

## 2015-02-16 MED ORDER — OXYCODONE HCL 5 MG PO TABS
5.0000 mg | ORAL_TABLET | Freq: Four times a day (QID) | ORAL | Status: DC | PRN
  Administered 2015-02-17 – 2015-02-18 (×4): 5 mg via ORAL
  Filled 2015-02-16 (×4): qty 1

## 2015-02-16 MED ORDER — HYDROMORPHONE HCL 1 MG/ML IJ SOLN
0.2500 mg | INTRAMUSCULAR | Status: DC | PRN
Start: 2015-02-16 — End: 2015-02-16
  Administered 2015-02-16 (×4): 0.5 mg via INTRAVENOUS

## 2015-02-16 MED ORDER — ALPRAZOLAM 1 MG PO TABS
1.0000 mg | ORAL_TABLET | Freq: Every day | ORAL | Status: DC
  Administered 2015-02-16: 0.5 mg via ORAL
  Administered 2015-02-17: 1 mg via ORAL
  Filled 2015-02-16 (×2): qty 1

## 2015-02-16 SURGICAL SUPPLY — 40 items
APL SKNCLS STERI-STRIP NONHPOA (GAUZE/BANDAGES/DRESSINGS)
APPLIER CLIP ROT 10 11.4 M/L (STAPLE) ×2
APR CLP MED LRG 11.4X10 (STAPLE) ×1
BAG SPEC RTRVL 10 TROC 200 (ENDOMECHANICALS) ×1
BENZOIN TINCTURE PRP APPL 2/3 (GAUZE/BANDAGES/DRESSINGS) IMPLANT
CABLE HIGH FREQUENCY MONO STRZ (ELECTRODE) ×2 IMPLANT
CATH REDDICK CHOLANGI 4FR 50CM (CATHETERS) ×2 IMPLANT
CLIP APPLIE ROT 10 11.4 M/L (STAPLE) ×1 IMPLANT
COVER MAYO STAND STRL (DRAPES) ×2 IMPLANT
COVER SURGICAL LIGHT HANDLE (MISCELLANEOUS) ×1 IMPLANT
DECANTER SPIKE VIAL GLASS SM (MISCELLANEOUS) ×1 IMPLANT
DRAPE C-ARM 42X120 X-RAY (DRAPES) ×2 IMPLANT
DRAPE LAPAROSCOPIC ABDOMINAL (DRAPES) ×2 IMPLANT
ELECT L-HOOK LAP 45CM DISP (ELECTROSURGICAL)
ELECT PENCIL ROCKER SW 15FT (MISCELLANEOUS) ×2 IMPLANT
ELECT REM PT RETURN 9FT ADLT (ELECTROSURGICAL) ×2
ELECTRODE L-HOOK LAP 45CM DISP (ELECTROSURGICAL) IMPLANT
ELECTRODE REM PT RTRN 9FT ADLT (ELECTROSURGICAL) ×1 IMPLANT
GLOVE BIOGEL M 8.0 STRL (GLOVE) ×1 IMPLANT
GOWN STRL REUS W/TWL XL LVL3 (GOWN DISPOSABLE) ×8 IMPLANT
HEMOSTAT SURGICEL 4X8 (HEMOSTASIS) IMPLANT
IV CATH 14GX2 1/4 (CATHETERS) ×2 IMPLANT
KIT BASIN OR (CUSTOM PROCEDURE TRAY) ×2 IMPLANT
L-HOOK LAP DISP 36CM (ELECTROSURGICAL) ×2
LHOOK LAP DISP 36CM (ELECTROSURGICAL) IMPLANT
LIQUID BAND (GAUZE/BANDAGES/DRESSINGS) ×2 IMPLANT
POUCH RETRIEVAL ECOSAC 10 (ENDOMECHANICALS) IMPLANT
POUCH RETRIEVAL ECOSAC 10MM (ENDOMECHANICALS) ×1
SCISSORS LAP 5X45 EPIX DISP (ENDOMECHANICALS) ×2 IMPLANT
SCRUB PCMX 4 OZ (MISCELLANEOUS) ×2 IMPLANT
SET IRRIG TUBING LAPAROSCOPIC (IRRIGATION / IRRIGATOR) ×2 IMPLANT
SLEEVE XCEL OPT CAN 5 100 (ENDOMECHANICALS) ×4 IMPLANT
STRIP CLOSURE SKIN 1/2X4 (GAUZE/BANDAGES/DRESSINGS) IMPLANT
SUT VIC AB 4-0 SH 18 (SUTURE) ×2 IMPLANT
SYR 20CC LL (SYRINGE) ×2 IMPLANT
TOWEL OR 17X26 10 PK STRL BLUE (TOWEL DISPOSABLE) ×2 IMPLANT
TRAY LAPAROSCOPIC (CUSTOM PROCEDURE TRAY) ×2 IMPLANT
TROCAR BLADELESS OPT 5 100 (ENDOMECHANICALS) ×2 IMPLANT
TROCAR XCEL BLUNT TIP 100MML (ENDOMECHANICALS) IMPLANT
TROCAR XCEL NON-BLD 11X100MML (ENDOMECHANICALS) ×2 IMPLANT

## 2015-02-16 NOTE — Anesthesia Postprocedure Evaluation (Signed)
  Anesthesia Post-op Note  Patient: [name]  Procedure(s) Performed: Procedure(s) (LRB): LAPAROSCOPIC CHOLECYSTECTOMY WITH INTRAOPERATIVE CHOLANGIOGRAM (N/A)  Patient Location: PACU  Anesthesia Type: General  Level of Consciousness: awake and alert   Airway and Oxygen Therapy: Patient Spontanous Breathing  Post-op Pain: mild  Post-op Assessment: Post-op Vital signs reviewed, Patient's Cardiovascular Status Stable, Respiratory Function Stable, Patent Airway and No signs of Nausea or vomiting  Last Vitals:  Filed Vitals:   02/16/15 1000  BP: 110/66  Pulse: 60  Temp:   Resp: 13    Post-op Vital Signs: stable   Complications: No apparent anesthesia complications

## 2015-02-16 NOTE — Anesthesia Procedure Notes (Signed)

## 2015-02-16 NOTE — Interval H&P Note (Signed)
History and Physical Interval Note:  02/16/2015 7:23 AM  [name]  has presented today for surgery, with the diagnosis of GALLSTONES  The various methods of treatment have been discussed with the patient and family. After consideration of risks, benefits and other options for treatment, the patient has consented to  Procedure(s): LAPAROSCOPIC CHOLECYSTECTOMY WITH INTRAOPERATIVE CHOLANGIOGRAM (N/A) as a surgical intervention .  The patient's history has been reviewed, patient examined, no change in status, stable for surgery.  I have reviewed the patient's chart and labs.  Questions were answered to the patient's satisfaction.     Ananiah Maciolek B

## 2015-02-16 NOTE — Anesthesia Preprocedure Evaluation (Addendum)
Anesthesia Evaluation  Patient identified by MRN, date of birth, ID band  Reviewed: Allergy & Precautions, H&P , NPO status , Patient's Chart, lab work & pertinent test results  Airway Mallampati: III  TM Distance: >3 FB   Mouth opening: Limited Mouth Opening  Dental no notable dental hx. (+) Teeth Intact, Dental Advisory Given   Pulmonary shortness of breath, asthma , former smoker,    Pulmonary exam normal breath sounds clear to auscultation       Cardiovascular hypertension, On Medications and Pt. on medications + angina Normal cardiovascular exam+ pacemaker  Rhythm:Regular Rate:Normal  Sick sinus syndrome   Neuro/Psych  Headaches, PSYCHIATRIC DISORDERS  Neuromuscular disease    GI/Hepatic Neg liver ROS, GERD  Poorly Controlled,  Endo/Other  diabetes, Well Controlled, Type 2, Oral Hypoglycemic Agents  Renal/GU negative Renal ROS  negative genitourinary   Musculoskeletal   Abdominal   Peds  Hematology negative hematology ROS (+)   Anesthesia Other Findings   Reproductive/Obstetrics negative OB ROS                           Anesthesia Physical Anesthesia Plan  ASA: III  Anesthesia Plan: General   Post-op Pain Management:    Induction: Intravenous  Airway Management Planned: Oral ETT  Additional Equipment:   Intra-op Plan:   Post-operative Plan: Extubation in OR  Informed Consent:   Plan Discussed with: Surgeon  Anesthesia Plan Comments:         Anesthesia Quick Evaluation

## 2015-02-16 NOTE — Transfer of Care (Signed)
Immediate Anesthesia Transfer of Care Note  Patient: [name]  Procedure(s) Performed: Procedure(s): LAPAROSCOPIC CHOLECYSTECTOMY WITH INTRAOPERATIVE CHOLANGIOGRAM (N/A)  Patient Location: PACU  Anesthesia Type:General  Level of Consciousness:  sedated, patient cooperative and responds to stimulation  Airway & Oxygen Therapy:Patient Spontanous Breathing and Patient connected to face mask oxgen  Post-op Assessment:  Report given to PACU RN and Post -op Vital signs reviewed and stable  Post vital signs:  Reviewed and stable  Last Vitals:  Filed Vitals:   02/16/15 0542  BP: 130/90  Pulse: 71  Temp: 36.4 C  Resp: 18    Complications: No apparent anesthesia complications

## 2015-02-16 NOTE — Progress Notes (Addendum)
First bag of ancef,leaked onto floor. Second bag of ancef gotten out of pyxis. Pt awake and talking with visitors.

## 2015-02-16 NOTE — Op Note (Signed)
[place] [facility]   02/16/2015  8:56 AM  Procedure: Laparoscopic Cholecystectomy with intraoperative cholangiogram  Surgeon: Catalina Antigua B. Hassell Done, MD, FACS Asst:  none  Anes:  General  Drains:  None  Findings: Chronic cholecystitis with multiple yellow faceted stones impacted in the cystic duct;  Normal IOC  Description of Procedure: The patient was taken to OR 6 and given general anesthesia.  The patient was prepped with PCMX and draped sterilely. A time out was performed.  Access to the abdomen was achieved with a 5 mm Optiview without difficulty.  Port placement included three 5 mm ports and a 12 in the upper midline placed obliquely.    The gallbladder was visualized and the fundus was grasped and the gallbladder was elevated. Traction on the infundibulum allowed for successful demonstration of the critical view. Inflammatory changes were chronic with filmy adhesions all along the fundus to the duodenum.  The cystic duct was identified and clipped up on the gallbladder and an incision was made in the cystic duct;  Multiple faceted stones were removed and the Reddick catheter was inserted after milking the cystic duct of the debris. A dynamic cholangiogram was performed which demonstrated intrahepatic filling and free flow into the duodenum.    The cystic duct was then triple clipped and divided, the cystic artery was double clipped and divided and then the gallbladder was removed from the gallbladder bed. Removal of the gallbladder from the gallbladder bed was straightforward without entering it or spilling any stones.  The gallbladder was then placed in a bag and brought out through one of the trocar sites. The gallbladder bed was inspected and no bleeding or bile leaks were seen.   Laparoscopic visualization was used when closing fascial defects for trocar sites.   Incisions were injected with Exparel and closed with 4-0 Vicryl and Liquiban on the skin.  Sponge and needle count were correct.    The  patient was taken to the recovery room in satisfactory condition.

## 2015-02-17 ENCOUNTER — Encounter (HOSPITAL_COMMUNITY): Payer: Self-pay | Admitting: Surgery

## 2015-02-17 ENCOUNTER — Ambulatory Visit: Payer: Medicare Other | Admitting: Physician Assistant

## 2015-02-17 DIAGNOSIS — K8010 Calculus of gallbladder with chronic cholecystitis without obstruction: Secondary | ICD-10-CM | POA: Diagnosis not present

## 2015-02-17 LAB — COMPREHENSIVE METABOLIC PANEL
ALBUMIN: 3.6 g/dL (ref 3.5–5.0)
ALK PHOS: 36 U/L — AB (ref 38–126)
ALT: 37 U/L (ref 14–54)
ANION GAP: 5 (ref 5–15)
AST: 36 U/L (ref 15–41)
BUN: 5 mg/dL — ABNORMAL LOW (ref 6–20)
CALCIUM: 8.5 mg/dL — AB (ref 8.9–10.3)
CHLORIDE: 106 mmol/L (ref 101–111)
CO2: 27 mmol/L (ref 22–32)
Creatinine, Ser: 0.57 mg/dL (ref 0.44–1.00)
GFR calc non Af Amer: >60 mL/min (ref >60)
GLUCOSE: 119 mg/dL — AB (ref 65–99)
POTASSIUM: 3.9 mmol/L (ref 3.5–5.1)
SODIUM: 138 mmol/L (ref 135–145)
Total Bilirubin: 0.7 mg/dL (ref 0.3–1.2)
Total Protein: 6.5 g/dL (ref 6.5–8.1)

## 2015-02-17 LAB — CBC
HEMATOCRIT: 32.8 % — AB (ref 36.0–46.0)
HEMOGLOBIN: 10.5 g/dL — AB (ref 12.0–15.0)
MCH: 27.4 pg (ref 26.0–34.0)
MCHC: 32 g/dL (ref 30.0–36.0)
MCV: 85.6 fL (ref 78.0–100.0)
Platelets: 250 10*3/uL (ref 150–400)
RBC: 3.83 MIL/uL — AB (ref 3.87–5.11)
RDW: 13.4 % (ref 11.5–15.5)
WBC: 7.9 10*3/uL (ref 4.0–10.5)

## 2015-02-18 DIAGNOSIS — K8010 Calculus of gallbladder with chronic cholecystitis without obstruction: Secondary | ICD-10-CM | POA: Diagnosis not present

## 2015-02-18 MED ORDER — PANTOPRAZOLE SODIUM 40 MG PO TBEC: 40.0000 mg | DELAYED_RELEASE_TABLET | Freq: Every day | ORAL | Status: DC

## 2015-02-18 MED ORDER — POLYETHYLENE GLYCOL 3350 17 G PO PACK: 17.0000 g | PACK | Freq: Every day | ORAL | Status: DC

## 2015-02-18 NOTE — Progress Notes (Signed)
The patient is receiving Protonix by the intravenous route.  Based on criteria approved by the Pharmacy and [place], the medication is being converted to the equivalent oral dose form.  These criteria include: -No Active GI bleeding -Able to tolerate diet of full liquids (or better) or tube feeding -Able to tolerate other medications by the oral or enteral route  If you have any questions about this conversion, please contact the Pharmacy Department (phone [phone]).  Thank you.  Minda Ditto PharmD Pager [phone] 02/18/2015, 11:18 AM

## 2015-02-18 NOTE — Discharge Summary (Signed)
Physician Discharge Summary  Patient ID: ASAL TEAS MRN: [id number] DOB/AGE: September 20, 1955 59 y.o.  Admit date: 02/16/2015 Discharge date: 02/18/2015  Admission Diagnoses:  Chronic cholecystitis  Discharge Diagnoses:  same  Active Problems:   Gallstones   Cholecystitis   Surgery:  Lap chole with IOC  Discharged Condition: improved  Hospital Course:   Had surgery.  Pain and discomfort delayed discharge.    Consults: none  Significant Diagnostic Studies: path pending    Discharge Exam: Blood pressure 146/80, pulse 59, temperature 98.2 F (36.8 C), temperature source Oral, resp. rate 16, height 5' 7"  (1.702 m), weight 68.04 kg (150 lb), SpO2 99 %. Incisions OK.  Complaining of some left sinusitis  Disposition: 01-Home or Self Care  Discharge Instructions    Diet - low sodium heart healthy    Complete by:  As directed      Discharge instructions    Complete by:  As directed   For sinusitis, you may need to see your ENT doctor.  Investigate "Aggie Moats" for sinus irrigation.  Afrin spray can improve sinus drainage.     Increase activity slowly    Complete by:  As directed             Medication List    TAKE these medications        albuterol 108 (90 BASE) MCG/ACT inhaler  Commonly known as:  PROVENTIL HFA;VENTOLIN HFA  Inhale 2 puffs into the lungs every 6 (six) hours as needed for wheezing.     ALPRAZolam 1 MG tablet  Commonly known as:  XANAX  TAKE 1 TABLET TWICE A DAY AS NEEDED FOR ANXIETY     beclomethasone 40 MCG/ACT inhaler  Commonly known as:  QVAR  Inhale 1-2 puffs into the lungs 2 (two) times daily.     benzonatate 100 MG capsule  Commonly known as:  TESSALON  Take 1-2 capsules (100-200 mg total) by mouth 3 (three) times daily as needed for cough.     cholecalciferol 1000 UNITS tablet  Commonly known as:  VITAMIN D  Take 1 tablet (1,000 Units total) by mouth daily.     cyclobenzaprine 10 MG tablet  Commonly known as:  FLEXERIL  Take 10 mg by  mouth 3 (three) times daily as needed for muscle spasms.     Desoximetasone 0.25 % ointment  Commonly known as:  TOPICORT  Apply 1 application topically 2 (two) times daily as needed (for psoriasis).     dexlansoprazole 60 MG capsule  Commonly known as:  DEXILANT  Take 60 mg by mouth daily as needed (for acid reflux).     diazepam 5 MG tablet  Commonly known as:  VALIUM  Take 1 tablet (5 mg total) by mouth daily.     docusate sodium 100 MG capsule  Commonly known as:  COLACE  TAKE 1 CAPSULE (100 MG TOTAL) BY MOUTH DAILY AS NEEDED FOR MILD CONSTIPATION.     EDARBI 40 MG Tabs  Generic drug:  Azilsartan Medoxomil  Take 20 mg by mouth every morning.     escitalopram 20 MG tablet  Commonly known as:  LEXAPRO  Take 1 tablet (20 mg total) by mouth daily.     fenofibrate 160 MG tablet  Take 1 tablet (160 mg total) by mouth daily.     fluticasone 50 MCG/ACT nasal spray  Commonly known as:  FLONASE  USE 2 SPRAYS INTO EACH NOSTRIL EVERY DAY     furosemide 20 MG tablet  Commonly known  as:  LASIX  Take 1 tablet (20 mg total) by mouth daily as needed (leg swelling).     hydrOXYzine 25 MG tablet  Commonly known as:  ATARAX/VISTARIL  Take 0.5-2 tablets (12.5-50 mg total) by mouth at bedtime as needed for anxiety (insomnia).     ipratropium 0.03 % nasal spray  Commonly known as:  ATROVENT  USE 2 SPRAYS INTO THE NOSE EVERY 12 (TWELVE) HOURS.     meloxicam 15 MG tablet  Commonly known as:  MOBIC  TAKE 1 TABLET (15 MG TOTAL) BY MOUTH DAILY.     metaxalone 800 MG tablet  Commonly known as:  SKELAXIN  Take 400-800 mg by mouth daily as needed for pain (for muscle spasms).     montelukast 10 MG tablet  Commonly known as:  SINGULAIR  Take 1 tablet (10 mg total) by mouth daily.     multivitamin tablet  Take 1 tablet by mouth daily.     NITROSTAT 0.4 MG SL tablet  Generic drug:  nitroGLYCERIN  Place 0.4 mg under the tongue every 5 (five) minutes as needed for chest pain. For chest  pain.     oxyCODONE-acetaminophen 10-325 MG per tablet  Commonly known as:  PERCOCET  Take 10-325 tablets by mouth every 6 (six) hours as needed for pain.     pravastatin 20 MG tablet  Commonly known as:  PRAVACHOL  Take 1 tablet (20 mg total) by mouth daily.     PROBIOTIC DAILY Caps  Take 1 capsule by mouth 2 (two) times a week.     ranitidine 150 MG tablet  Commonly known as:  ZANTAC  Take 150 mg by mouth as needed for heartburn.     zolpidem 10 MG tablet  Commonly known as:  AMBIEN  Take 10 mg by mouth at bedtime as needed for sleep.           Follow-up Information    Follow up with Johnathan Hausen B, MD In 2 weeks.   Specialty:  General Surgery   Contact information:   [address] [place] [address] [phone]       Signed: Pedro Earls 02/18/2015, 10:44 AM

## 2015-02-18 NOTE — Progress Notes (Signed)
Discharge instructions reviewed with patient.  She did not have any questions or concerns.  Patient ambulatory without distress.  Family present to take patient home.

## 2015-02-20 ENCOUNTER — Telehealth: Payer: Self-pay

## 2015-02-20 MED ORDER — AMOXICILLIN-POT CLAVULANATE 875-125 MG PO TABS: 1.0000 | ORAL_TABLET | Freq: Two times a day (BID) | ORAL | Status: AC

## 2015-02-20 NOTE — Telephone Encounter (Signed)
[name]   Patient was released from the hospital and is unable to sit up.   She has a bad sinus infection and is requesting your help in securing treatment w/o having to come in.    [facility] [address]   [phone]

## 2015-02-20 NOTE — Telephone Encounter (Signed)
Meds ordered this encounter  Medications  . amoxicillin-clavulanate (AUGMENTIN) 875-125 MG per tablet    Sig: Take 1 tablet by mouth 2 (two) times daily.    Dispense:  20 tablet    Refill:  0    Order Specific Question:  Supervising Provider    Answer:  DOOLITTLE, ROBERT P [[id number]    Was she able to sit up in the hospital?  If so, and she can't now, she needs to contact her surgeon.

## 2015-02-20 NOTE — Telephone Encounter (Signed)
Noted  

## 2015-02-20 NOTE — Telephone Encounter (Signed)
Spoke with pt, advised Rx sent. She states her surgeon knows and she has an appt on Wednesday 9/28 with him.

## 2015-02-20 NOTE — Telephone Encounter (Signed)
Please advise 

## 2015-02-28 LAB — CUP PACEART REMOTE DEVICE CHECK
Battery Remaining Longevity: 122 mo
Battery Voltage: 3.03 V
Brady Statistic AS VP Percent: 0 %
Date Time Interrogation Session: 20160907130235
Lead Channel Impedance Value: 399 Ohm
Lead Channel Pacing Threshold Amplitude: 0.625 V
Lead Channel Pacing Threshold Pulse Width: 0.4 ms
Lead Channel Sensing Intrinsic Amplitude: 4.125 mV
Lead Channel Setting Pacing Amplitude: 2.5 V
Lead Channel Setting Sensing Sensitivity: 2.8 mV
MDC IDC MSMT LEADCHNL RA IMPEDANCE VALUE: 532 Ohm
MDC IDC MSMT LEADCHNL RA PACING THRESHOLD PULSEWIDTH: 0.4 ms
MDC IDC MSMT LEADCHNL RA SENSING INTR AMPL: 4.125 mV
MDC IDC MSMT LEADCHNL RV IMPEDANCE VALUE: 456 Ohm
MDC IDC MSMT LEADCHNL RV IMPEDANCE VALUE: 551 Ohm
MDC IDC MSMT LEADCHNL RV PACING THRESHOLD AMPLITUDE: 0.875 V
MDC IDC MSMT LEADCHNL RV SENSING INTR AMPL: 26 mV
MDC IDC MSMT LEADCHNL RV SENSING INTR AMPL: 26 mV
MDC IDC SET LEADCHNL RA PACING AMPLITUDE: 2 V
MDC IDC SET LEADCHNL RV PACING PULSEWIDTH: 0.4 ms
MDC IDC SET ZONE DETECTION INTERVAL: 400 ms
MDC IDC SET ZONE DETECTION INTERVAL: 400 ms
MDC IDC STAT BRADY AP VP PERCENT: 0.09 %
MDC IDC STAT BRADY AP VS PERCENT: 98.31 %
MDC IDC STAT BRADY AS VS PERCENT: 1.6 %
MDC IDC STAT BRADY RA PERCENT PACED: 98.4 %
MDC IDC STAT BRADY RV PERCENT PACED: 0.09 %

## 2015-03-06 ENCOUNTER — Encounter: Payer: Self-pay | Admitting: Cardiology

## 2015-03-10 ENCOUNTER — Encounter: Payer: Self-pay | Admitting: Physician Assistant

## 2015-03-10 ENCOUNTER — Ambulatory Visit (INDEPENDENT_AMBULATORY_CARE_PROVIDER_SITE_OTHER): Payer: Medicare Other | Admitting: Physician Assistant

## 2015-03-10 VITALS — BP 118/80 | HR 98 | Temp 98.1°F | Resp 16 | Ht 67.0 in | Wt 148.2 lb

## 2015-03-10 DIAGNOSIS — Z23 Encounter for immunization: Secondary | ICD-10-CM

## 2015-03-10 DIAGNOSIS — I10 Essential (primary) hypertension: Secondary | ICD-10-CM | POA: Diagnosis not present

## 2015-03-10 DIAGNOSIS — E119 Type 2 diabetes mellitus without complications: Secondary | ICD-10-CM | POA: Diagnosis not present

## 2015-03-10 DIAGNOSIS — B353 Tinea pedis: Secondary | ICD-10-CM | POA: Diagnosis not present

## 2015-03-10 DIAGNOSIS — L409 Psoriasis, unspecified: Secondary | ICD-10-CM | POA: Insufficient documentation

## 2015-03-10 DIAGNOSIS — E559 Vitamin D deficiency, unspecified: Secondary | ICD-10-CM

## 2015-03-10 DIAGNOSIS — F32A Depression, unspecified: Secondary | ICD-10-CM

## 2015-03-10 DIAGNOSIS — F418 Other specified anxiety disorders: Secondary | ICD-10-CM

## 2015-03-10 DIAGNOSIS — J310 Chronic rhinitis: Secondary | ICD-10-CM

## 2015-03-10 DIAGNOSIS — E785 Hyperlipidemia, unspecified: Secondary | ICD-10-CM

## 2015-03-10 DIAGNOSIS — F419 Anxiety disorder, unspecified: Secondary | ICD-10-CM

## 2015-03-10 DIAGNOSIS — F329 Major depressive disorder, single episode, unspecified: Secondary | ICD-10-CM

## 2015-03-10 LAB — COMPREHENSIVE METABOLIC PANEL
ALK PHOS: 43 U/L (ref 33–130)
ALT: 24 U/L (ref 6–29)
AST: 20 U/L (ref 10–35)
Albumin: 4.5 g/dL (ref 3.6–5.1)
BUN: 11 mg/dL (ref 7–25)
CALCIUM: 10.1 mg/dL (ref 8.6–10.4)
CHLORIDE: 105 mmol/L (ref 98–110)
CO2: 28 mmol/L (ref 20–31)
Creat: 0.65 mg/dL (ref 0.50–1.05)
GLUCOSE: 93 mg/dL (ref 65–99)
POTASSIUM: 4.8 mmol/L (ref 3.5–5.3)
Sodium: 141 mmol/L (ref 135–146)
Total Bilirubin: 0.5 mg/dL (ref 0.2–1.2)
Total Protein: 7.7 g/dL (ref 6.1–8.1)

## 2015-03-10 LAB — LIPID PANEL
CHOL/HDL RATIO: 3.1 ratio (ref <=5.0)
Cholesterol: 122 mg/dL — ABNORMAL LOW (ref 125–200)
HDL: 40 mg/dL — AB (ref >=46)
LDL Cholesterol: 73 mg/dL (ref <130)
Triglycerides: 45 mg/dL (ref <150)
VLDL: 9 mg/dL (ref <30)

## 2015-03-10 LAB — POCT GLYCOSYLATED HEMOGLOBIN (HGB A1C): HEMOGLOBIN A1C: 6

## 2015-03-10 LAB — GLUCOSE, POCT (MANUAL RESULT ENTRY): POC GLUCOSE: 104 mg/dL — AB (ref 70–99)

## 2015-03-10 MED ORDER — ALPRAZOLAM 1 MG PO TABS: 1.0000 mg | ORAL_TABLET | Freq: Every day | ORAL | Status: DC

## 2015-03-10 MED ORDER — IPRATROPIUM BROMIDE 0.03 % NA SOLN: NASAL | Status: DC

## 2015-03-10 MED ORDER — KETOCONAZOLE 2 % EX CREA: 1.0000 "application " | TOPICAL_CREAM | Freq: Every day | CUTANEOUS | Status: DC

## 2015-03-10 NOTE — Patient Instructions (Signed)
I will contact you with your lab results as soon as they are available.   If you have not heard from me in 2 weeks, please contact me.  The fastest way to get your results is to register for My Chart (see the instructions on the last page of this printout).

## 2015-03-10 NOTE — Progress Notes (Signed)
Patient ID: [name], female    DOB: May 13, 1956, 59 y.o.   MRN: [id number]  PCP: Elsie Sakuma, PA-C  Subjective:   Chief Complaint  Patient presents with  . recheck blood work  . Medication Refill  . psoriasis on both feet    HPI Presents for evaluation of diabetes, a relatively new diagnosis, and psoriasis.  Since her last visit with me, she has undergone laparoscopic cholecystectomy with Dr. Kaylyn Lim.  The cream for psoriasis on her feet isn't working recently, she has itchy lesions bilaterally and requests something stronger.  Otherwise, she is working to regain her strength after surgery. She's tired. Easily fatigued. Building back up her stamina with walking for exercise.    Review of Systems  Constitutional: Positive for activity change and fatigue. Negative for fever, chills, diaphoresis, appetite change and unexpected weight change.  HENT: Negative for congestion, ear pain, nosebleeds, postnasal drip, rhinorrhea, sinus pressure, sneezing, sore throat, trouble swallowing and voice change.   Eyes: Negative for visual disturbance.  Respiratory: Negative for cough, choking, chest tightness and shortness of breath.   Cardiovascular: Negative for chest pain, palpitations and leg swelling.  Gastrointestinal: Negative for nausea, vomiting and diarrhea.  Endocrine: Negative.   Genitourinary: Negative for dysuria, urgency, frequency and hematuria.  Musculoskeletal: Positive for myalgias and arthralgias.  Skin: Positive for rash.  Neurological: Positive for weakness (generalized). Negative for dizziness, light-headedness and headaches.  Hematological: Negative for adenopathy.  Psychiatric/Behavioral: Positive for dysphoric mood. Negative for sleep disturbance and self-injury. The patient is nervous/anxious.        Patient Active Problem List   Diagnosis Date Noted  . Cholecystitis 02/16/2015  . Gallstones 02/15/2015  . Right foot pain 11/13/2014  . Ethmoid  sinusitis 11/13/2014  . Abdominal pain, epigastric 11/13/2014  . Vitamin D deficiency 11/13/2014  . 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 Sick sinus syndrome ([place]) 07/08/2014  . Chest pain at rest 07/05/2014  . Chronic rhinitis 10/22/2013  . Fatty liver 10/22/2013  . Interstitial cystitis 10/22/2013  . Hip pain, right 10/22/2013  . Chronic insomnia 09/25/2012  . HTN (hypertension) 09/25/2012  . Hyperlipidemia 09/25/2012  . Diabetes mellitus type 2, controlled, without complications ([place]) 85/63/1497  . GERD 12/04/2008  . History of colonic polyps 12/04/2008  . CONSTIPATION 10/21/2008  . Fibromyalgia 10/21/2008  . LACTOSE INTOLERANCE 11/20/2007  . CHEST PAIN 11/20/2007  . Anxiety and depression 08/02/2007  . Osteoporosis 08/02/2007  . DIVERTICULOSIS, COLON 11/16/2006  . EXTERNAL HEMORRHOIDS 05/12/2005     Prior to Admission medications   Medication Sig Start Date End Date Taking? Authorizing Provider  albuterol (PROVENTIL HFA;VENTOLIN HFA) 108 (90 BASE) MCG/ACT inhaler Inhale 2 puffs into the lungs every 6 (six) hours as needed for wheezing. 08/15/13  Yes Lawson Isabell, PA-C  ALPRAZolam (XANAX) 1 MG tablet TAKE 1 TABLET TWICE A DAY AS NEEDED FOR ANXIETY Patient taking differently: Take 1 mg by mouth at bedtime.  01/07/15  Yes Yalissa Fink, PA-C  Azilsartan Medoxomil (EDARBI) 40 MG TABS Take 20 mg by mouth every morning.   Yes Historical Provider, MD  beclomethasone (QVAR) 40 MCG/ACT inhaler Inhale 1-2 puffs into the lungs 2 (two) times daily. 03/26/14 03/27/15 Yes Stephanie D English, PA  cholecalciferol (VITAMIN D) 1000 UNITS tablet Take 1 tablet (1,000 Units total) by mouth daily. 11/24/14  Yes Catilyn Boggus, PA-C  cyclobenzaprine (FLEXERIL) 10 MG tablet Take 10 mg by mouth 3 (three) times daily as needed for muscle spasms.   Yes Historical Provider, MD  Desoximetasone (TOPICORT) 0.25 % ointment Apply 1 application topically 2 (two) times daily as needed (for psoriasis). 11/13/14  Yes  Krithi Bray, PA-C  dexlansoprazole (DEXILANT) 60 MG capsule Take 60 mg by mouth daily as needed (for acid reflux).   Yes Historical Provider, MD  diazepam (VALIUM) 5 MG tablet Take 1 tablet (5 mg total) by mouth daily. Patient taking differently: Take 5 mg by mouth daily as needed for anxiety or muscle spasms.  07/18/13  Yes Kayden Hutmacher, PA-C  docusate sodium (COLACE) 100 MG capsule TAKE 1 CAPSULE (100 MG TOTAL) BY MOUTH DAILY AS NEEDED FOR MILD CONSTIPATION. 10/23/14  Yes Baneza Bartoszek, PA-C  escitalopram (LEXAPRO) 20 MG tablet Take 1 tablet (20 mg total) by mouth daily. 02/10/15  Yes Heatherly Stenner, PA-C  fenofibrate 160 MG tablet Take 1 tablet (160 mg total) by mouth daily. Patient taking differently: Take 160 mg by mouth at bedtime.  11/13/14  Yes Johnanna Bakke, PA-C  furosemide (LASIX) 20 MG tablet Take 1 tablet (20 mg total) by mouth daily as needed (leg swelling). 10/22/13  Yes Javiana Anwar, PA-C  ipratropium (ATROVENT) 0.03 % nasal spray USE 2 SPRAYS INTO THE NOSE EVERY 12 (TWELVE) HOURS.   Yes Tziporah Knoke, PA-C  meloxicam (MOBIC) 15 MG tablet TAKE 1 TABLET (15 MG TOTAL) BY MOUTH DAILY. 12/29/14  Yes Nareh Matzke, PA-C  metaxalone (SKELAXIN) 800 MG tablet Take 400-800 mg by mouth daily as needed for pain (for muscle spasms).    Yes Historical Provider, MD  montelukast (SINGULAIR) 10 MG tablet Take 1 tablet (10 mg total) by mouth daily. 09/02/14  Yes Lillias Difrancesco, PA-C  Multiple Vitamin (MULTIVITAMIN) tablet Take 1 tablet by mouth daily.   Yes Historical Provider, MD  NITROSTAT 0.4 MG SL tablet Place 0.4 mg under the tongue every 5 (five) minutes as needed for chest pain. For chest pain. 11/24/11  Yes Historical Provider, MD  oxyCODONE-acetaminophen (PERCOCET) 10-325 MG per tablet Take 10-325 tablets by mouth every 6 (six) hours as needed for pain.  11/05/14  Yes Historical Provider, MD  pravastatin (PRAVACHOL) 20 MG tablet Take 1 tablet (20 mg total) by mouth daily. 11/24/14  Yes Latona Krichbaum, PA-C  Probiotic Product (PROBIOTIC DAILY) CAPS Take 1 capsule by mouth 2 (two) times a week.   Yes Historical Provider, MD  ranitidine (ZANTAC) 150 MG tablet Take 150 mg by mouth as needed for heartburn.    Yes Historical Provider, MD  zolpidem (AMBIEN) 10 MG tablet Take 10 mg by mouth at bedtime as needed for sleep.  09/28/13  Yes Historical Provider, MD  fluticasone (FLONASE) 50 MCG/ACT nasal spray USE 2 SPRAYS INTO EACH NOSTRIL EVERY DAY 11/13/14   [name], PA-C     Allergies  Allergen Reactions  . Codeine Other (See Comments)    HALLUCINATIONS   . Propoxyphene N-Acetaminophen Other (See Comments)    HALLUCINATIONS  . Sulfonamide Derivatives Hives, Itching and Swelling  . Alka-Seltzer [Aspirin Effervescent] Other (See Comments)    "like she's fading away"  . Norvasc [Amlodipine Besylate]   . Iohexol Nausea Only    JITTERY    . Latex Hives and Itching       Objective:  Physical Exam  Constitutional: She is oriented to person, place, and time. She appears well-developed and well-nourished. No distress.  BP 118/80 mmHg  Pulse 98  Temp(Src) 98.1 F (36.7 C) (Oral)  Resp 16  Ht 5' 7"  (1.702 m)  Wt 148 lb 3.2 oz (67.223 kg)  BMI 23.21 kg/m2   Eyes: Conjunctivae are normal. No scleral icterus.  Neck: No thyromegaly present.  Cardiovascular: Normal rate, regular rhythm, normal heart sounds and intact distal pulses.   Pulmonary/Chest: Effort normal and breath sounds normal.  Abdominal: There is tenderness (at laparoscopic incisions, which are healing nicely).  Lymphadenopathy:    She has no cervical adenopathy.  Neurological: She is alert and oriented to person, place, and time.  Skin: Skin is warm and dry. Lesion (RIGHT foot: cluster of pustules along the lateral edge, then "pocked" and peeling skin on the sole; LEFT: pocked and peeling skin in a patch on the sole) noted.  Psychiatric: She has a normal mood and affect. Her speech is normal and behavior is  normal. She expresses no suicidal ideation.       Results for orders placed or performed in visit on 03/10/15  POCT glucose (manual entry)  Result Value Ref Range   POC Glucose 104 (A) 70 - 99 mg/dl  POCT glycosylated hemoglobin (Hb A1C)  Result Value Ref Range   Hemoglobin A1C 6.0        Assessment & Plan:   1. Controlled type 2 diabetes mellitus without complication, without long-term current use of insulin (HCC) Excellent control. She'll let me know the type of lancet device she needs to go with her lancets and I'll send them to her pharmacy. - POCT glucose (manual entry) - POCT glycosylated hemoglobin (Hb A1C) - Comprehensive metabolic panel - Lipid panel - HM Diabetes Eye Exam  2. Vitamin D deficiency Await lab results - Vit D  25 hydroxy (rtn osteoporosis monitoring)  3. Need for influenza vaccination - Flu Vaccine QUAD 36+ mos IM  4. Need for pneumococcal vaccination - Pneumococcal polysaccharide vaccine 23-valent greater than or equal to 2yo subcutaneous/IM  5. Hyperlipidemia Await labs.  6. Essential hypertension Controlled.  7. Psoriasis Continue current treatment, but needs treatment for what appears to be tinea as well.  8. Tinea pedis of both feet Likely secondary to flare of psoriasis. - ketoconazole (NIZORAL) 2 % cream; Apply 1 application topically daily.  Dispense: 60 g; Refill: 0  9. Anxiety and depression Controlled. - ALPRAZolam (XANAX) 1 MG tablet; Take 1 tablet (1 mg total) by mouth at bedtime.  Dispense: 60 tablet; Refill: 0  10. Chronic rhinitis Controlled. - ipratropium (ATROVENT) 0.03 % nasal spray; USE 2 SPRAYS INTO THE NOSE EVERY 12 (TWELVE) HOURS.  Dispense: 30 mL; Refill: 9   Return in about 3 months (around 06/10/2015).   Fara Chute, PA-C Physician Assistant-Certified [facility]

## 2015-03-11 LAB — VITAMIN D 25 HYDROXY (VIT D DEFICIENCY, FRACTURES): VIT D 25 HYDROXY: 39 ng/mL (ref 30–100)

## 2015-03-12 ENCOUNTER — Telehealth: Payer: Self-pay

## 2015-03-12 DIAGNOSIS — M797 Fibromyalgia: Secondary | ICD-10-CM

## 2015-03-12 NOTE — Telephone Encounter (Signed)
Pt is needing to talk with chelle about her experience with the pain clinic appt she had today

## 2015-03-13 ENCOUNTER — Encounter: Payer: Self-pay | Admitting: Physician Assistant

## 2015-03-13 NOTE — Telephone Encounter (Signed)
Called and spoke with pt and she stated that she has an upcoming appt in [place] with current pain clinic. She is aware that she will need to keep this appt while we are working on getting her transferred to [facility].  She voiced her understanding and will keep this appt.  She is aware that we will call her once we have the other appt for her.  Nothing further is needed.

## 2015-03-13 NOTE — Telephone Encounter (Signed)
Spoke with pt, she states the doctor belittled her and felt he did not believe what she was saying. He wanted to give her a hip injection. But she refused. Pt was very tearful on the phone. Dr. Andree Elk end up giving her Hydrocodone and she states it makes her constipated and it does not help with her pain. She would like to be referred somewhere else. She said she understands her body and now that the weather is getting cold she is going to be in more pain. Pt is very upset about this interaction with this doctor. Please advise.

## 2015-03-13 NOTE — Telephone Encounter (Signed)
If she is not happy with [facility], we can try to get her in elsewhere (I recommend the [facility] for Pain Management), but strongly urge her to continue where she is until she can be transferred. If she quits or is dismissed from [facility], the other clinics will be reluctant to take her.

## 2015-03-17 ENCOUNTER — Telehealth: Payer: Self-pay | Admitting: Physician Assistant

## 2015-03-17 NOTE — Telephone Encounter (Signed)
Left message for patient. Will try again later today.

## 2015-03-17 NOTE — Telephone Encounter (Signed)
Left another message. Will try again tomorrow or Thursday.

## 2015-03-18 ENCOUNTER — Ambulatory Visit (INDEPENDENT_AMBULATORY_CARE_PROVIDER_SITE_OTHER): Payer: 59 | Admitting: Psychiatry

## 2015-03-18 DIAGNOSIS — F331 Major depressive disorder, recurrent, moderate: Secondary | ICD-10-CM | POA: Diagnosis not present

## 2015-03-20 NOTE — Telephone Encounter (Signed)
Left a message to return my call.

## 2015-03-20 NOTE — Telephone Encounter (Signed)
Has contacted a podiatrist, who will see her on Tuesday.  She will return here in the meantime if needed.

## 2015-03-23 ENCOUNTER — Encounter: Payer: Self-pay | Admitting: Internal Medicine

## 2015-04-06 ENCOUNTER — Encounter: Payer: Self-pay | Admitting: Physician Assistant

## 2015-04-06 NOTE — Telephone Encounter (Signed)
Pt dropped off envelope for [name]. I am placing it in her box. Thank you

## 2015-04-08 ENCOUNTER — Telehealth: Payer: Self-pay | Admitting: Physician Assistant

## 2015-04-08 NOTE — Telephone Encounter (Signed)
Spoke to patient regarding her disability forms. She states that she will drop off the original form this afternoon for [name] to complete.

## 2015-04-10 NOTE — Telephone Encounter (Signed)
Form received and completed. Placed in nurses' box at desk.

## 2015-04-15 ENCOUNTER — Ambulatory Visit (INDEPENDENT_AMBULATORY_CARE_PROVIDER_SITE_OTHER): Payer: 59 | Admitting: Psychiatry

## 2015-04-15 DIAGNOSIS — F331 Major depressive disorder, recurrent, moderate: Secondary | ICD-10-CM

## 2015-04-21 ENCOUNTER — Telehealth: Payer: Self-pay | Admitting: Physician Assistant

## 2015-04-21 NOTE — Telephone Encounter (Signed)
[name],  Ms. Seyer dropped off an attending physician statement form to be completed. The patient has provided an old copy of the form that you did for her in 56. I have started to complete some of the sections of this form and there several other areas that need to be reviewed. I have highlighted those areas. They are also asking for OV notes, lab results and current medication list which I can print from patient's EHR. I have placed this form and the old form in your mailbox at 9. Please return to disabilities department upon completion.    Thanks, [employer]

## 2015-04-23 NOTE — Telephone Encounter (Signed)
Form completed, returned to the FMLA/Disabilities box.  Please attach lab results, medication list.

## 2015-04-24 NOTE — Telephone Encounter (Signed)
Forms scanned in and faxed over on 04/24/15 patient called and notified will come by sometime this weekend to pick them up, will place in pick up draw at 102.

## 2015-04-27 ENCOUNTER — Telehealth: Payer: Self-pay | Admitting: Internal Medicine

## 2015-04-27 NOTE — Telephone Encounter (Signed)
Returned patient's call.  She states that the sharp soreness around her pacemaker site started around Thursday.  She is adamant that it is not chest pain, but states that she thinks she may have done too much activity.  She recently picked up a frozen [place] and did arm exercises and thinks this may have exacerbated the pain.  She states that she is "very fragile" because of fibromyalgia.  She states she has been laying around all day and that it has not bothered her as much.  She also states that using her heating pad has helped.  She states that raising her arms causes a sharp pain at the pacemaker site.  Advised patient to send a manual transmission and that I will call her back.  She is agreeable to this plan.

## 2015-04-27 NOTE — Telephone Encounter (Signed)
Discussed mammogram safety with [employer] rep, safe to proceed.  Patient aware and appreciative.  She denies any additional questions or concerns at this time.

## 2015-04-27 NOTE — Telephone Encounter (Signed)
New message    Patient calling c/o sharp pain around pacemaker, tender,

## 2015-04-27 NOTE — Telephone Encounter (Signed)
Called patient back to let her know that manual transmission showed no abnormalities/alerts.  Advised patient that if her pain worsens or changes, or if she has signs/symptoms of infection at device pocket site, to call us.  Patient aware and appreciative.  She would like to know if it would be safe for her to get an ultrasound or a mammogram with her device.  Advised patient that it is safe to proceed with an ultrasound but that I would find out about mammograms and call her back.  Patient is appreciative.

## 2015-05-13 ENCOUNTER — Ambulatory Visit (INDEPENDENT_AMBULATORY_CARE_PROVIDER_SITE_OTHER): Payer: 59 | Admitting: Psychiatry

## 2015-05-13 ENCOUNTER — Telehealth: Payer: Self-pay

## 2015-05-13 DIAGNOSIS — E2839 Other primary ovarian failure: Secondary | ICD-10-CM

## 2015-05-13 DIAGNOSIS — F331 Major depressive disorder, recurrent, moderate: Secondary | ICD-10-CM | POA: Diagnosis not present

## 2015-05-13 NOTE — Telephone Encounter (Addendum)
Pt states she is to have a Mammogram done on Friday at [facility] on [facility] and wanted to know if [name] can put an order in for her to have a Bone Density Test done also and can she put an order in for her. Please call [place]

## 2015-05-14 ENCOUNTER — Other Ambulatory Visit: Payer: Self-pay | Admitting: Pain Medicine

## 2015-05-14 ENCOUNTER — Ambulatory Visit (INDEPENDENT_AMBULATORY_CARE_PROVIDER_SITE_OTHER): Payer: Medicare Other | Admitting: *Deleted

## 2015-05-14 DIAGNOSIS — I495 Sick sinus syndrome: Secondary | ICD-10-CM | POA: Diagnosis not present

## 2015-05-14 DIAGNOSIS — M545 Low back pain: Secondary | ICD-10-CM

## 2015-05-14 NOTE — Telephone Encounter (Signed)
Patient stated that [name] has moved her appointment to Dec. 31st. Just wanted to make sure the order you put in would still be good then for the [facility] and the bone density test. She also wanted to wish you a Merry Christmas.

## 2015-05-14 NOTE — Telephone Encounter (Signed)
[name],  Please see previous message

## 2015-05-14 NOTE — Progress Notes (Signed)
Remote pacemaker transmission.   

## 2015-05-14 NOTE — Telephone Encounter (Signed)
There is already an order in the system. It can be released and performed at [facility].

## 2015-05-18 ENCOUNTER — Telehealth: Payer: Self-pay

## 2015-05-18 DIAGNOSIS — F329 Major depressive disorder, single episode, unspecified: Secondary | ICD-10-CM

## 2015-05-18 DIAGNOSIS — F419 Anxiety disorder, unspecified: Principal | ICD-10-CM

## 2015-05-18 DIAGNOSIS — F32A Depression, unspecified: Secondary | ICD-10-CM

## 2015-05-18 NOTE — Telephone Encounter (Signed)
Spoke with pt, she states she cannot deal with the pain management doctor. She states he told her that she has to get an injection in her back and if she does not, he will discontinue her pain medications. She also heard him talking about her in the hallway (Dr. Vira Blanco) with the other workers in the hallway. She states her rheumatoid doctor told her not to get the injections. He gave her a TENS unit but is unable to use it due to her pacemaker. She states he does not listen to her. She feels she is not abusing this medication. She is having panic attacks because of this whole situation and needs a refill on Xanax. She is scared to go back to him and he is supposed to be sending [place] something about this.

## 2015-05-18 NOTE — Telephone Encounter (Signed)
Pt is very upset having an episode of depression and would like to talk with chelle as soon as she can   Best number [phone]

## 2015-05-18 NOTE — Telephone Encounter (Signed)
Yes, it's still good, and I have just signed their paper form that will be faxed.  Merry Christmas!

## 2015-05-19 MED ORDER — ALPRAZOLAM 1 MG PO TABS: 1.0000 mg | ORAL_TABLET | Freq: Every day | ORAL | Status: DC

## 2015-05-19 NOTE — Telephone Encounter (Signed)
Rx faxed

## 2015-05-19 NOTE — Telephone Encounter (Signed)
Rx printed at 104. Will bring to 102 after clinic.  Meds ordered this encounter  Medications  . ALPRAZolam (XANAX) 1 MG tablet    Sig: Take 1 tablet (1 mg total) by mouth at bedtime.    Dispense:  60 tablet    Refill:  0    Not to exceed 5 additional fills before 11/03/2014.    Order Specific Question:  Supervising Provider    Answer:  Leandrew Koyanagi [[id number]]    With regards to the Pain Management. I will prescribe the oxycodone temporarily until we can get her in with an alternate Pain Management Specialist. I referred her to the [facility] practice in October. Can we get an update on this?

## 2015-05-20 ENCOUNTER — Encounter: Payer: Self-pay | Admitting: Physician Assistant

## 2015-05-20 NOTE — Telephone Encounter (Signed)
Advised pt and she agreed. She will come in to see [name].

## 2015-05-22 ENCOUNTER — Other Ambulatory Visit: Payer: Self-pay

## 2015-05-22 LAB — CUP PACEART REMOTE DEVICE CHECK
Battery Remaining Longevity: 109 mo
Battery Voltage: 3.02 V
Brady Statistic AP VP Percent: 0.1 %
Brady Statistic RA Percent Paced: 99.64 %
Brady Statistic RV Percent Paced: 0.1 %
Date Time Interrogation Session: 20161208145623
Implantable Lead Implant Date: 20160202
Implantable Lead Location: 753859
Implantable Lead Model: 5076
Implantable Lead Model: 5076
Lead Channel Impedance Value: 456 Ohm
Lead Channel Impedance Value: 551 Ohm
Lead Channel Impedance Value: 551 Ohm
Lead Channel Sensing Intrinsic Amplitude: 4.375 mV
Lead Channel Setting Pacing Amplitude: 2 V
Lead Channel Setting Pacing Amplitude: 2.5 V
MDC IDC LEAD IMPLANT DT: 20160202
MDC IDC LEAD LOCATION: 753860
MDC IDC MSMT LEADCHNL RA IMPEDANCE VALUE: 418 Ohm
MDC IDC MSMT LEADCHNL RA PACING THRESHOLD AMPLITUDE: 0.75 V
MDC IDC MSMT LEADCHNL RA PACING THRESHOLD PULSEWIDTH: 0.4 ms
MDC IDC MSMT LEADCHNL RA SENSING INTR AMPL: 4.375 mV
MDC IDC MSMT LEADCHNL RV PACING THRESHOLD AMPLITUDE: 0.875 V
MDC IDC MSMT LEADCHNL RV PACING THRESHOLD PULSEWIDTH: 0.4 ms
MDC IDC MSMT LEADCHNL RV SENSING INTR AMPL: 24.25 mV
MDC IDC MSMT LEADCHNL RV SENSING INTR AMPL: 24.25 mV
MDC IDC SET LEADCHNL RV PACING PULSEWIDTH: 0.4 ms
MDC IDC SET LEADCHNL RV SENSING SENSITIVITY: 2.8 mV
MDC IDC STAT BRADY AP VS PERCENT: 99.54 %
MDC IDC STAT BRADY AS VP PERCENT: 0 %
MDC IDC STAT BRADY AS VS PERCENT: 0.36 %

## 2015-05-27 ENCOUNTER — Encounter: Payer: Self-pay | Admitting: Cardiology

## 2015-05-29 ENCOUNTER — Other Ambulatory Visit: Payer: Self-pay

## 2015-06-02 ENCOUNTER — Encounter: Payer: Self-pay | Admitting: Family Medicine

## 2015-06-05 LAB — HM MAMMOGRAPHY

## 2015-06-05 LAB — HM DEXA SCAN

## 2015-06-09 ENCOUNTER — Ambulatory Visit (INDEPENDENT_AMBULATORY_CARE_PROVIDER_SITE_OTHER): Payer: Medicare Other | Admitting: Physician Assistant

## 2015-06-09 ENCOUNTER — Encounter: Payer: Self-pay | Admitting: Physician Assistant

## 2015-06-09 VITALS — BP 120/86 | HR 80 | Temp 98.4°F | Resp 16 | Ht 67.5 in | Wt 147.6 lb

## 2015-06-09 DIAGNOSIS — E785 Hyperlipidemia, unspecified: Secondary | ICD-10-CM | POA: Diagnosis not present

## 2015-06-09 DIAGNOSIS — J0101 Acute recurrent maxillary sinusitis: Secondary | ICD-10-CM | POA: Diagnosis not present

## 2015-06-09 DIAGNOSIS — M797 Fibromyalgia: Secondary | ICD-10-CM | POA: Diagnosis not present

## 2015-06-09 DIAGNOSIS — E119 Type 2 diabetes mellitus without complications: Secondary | ICD-10-CM | POA: Diagnosis not present

## 2015-06-09 DIAGNOSIS — I10 Essential (primary) hypertension: Secondary | ICD-10-CM

## 2015-06-09 LAB — POCT GLYCOSYLATED HEMOGLOBIN (HGB A1C): Hemoglobin A1C: 5.9

## 2015-06-09 LAB — LIPID PANEL
CHOLESTEROL: 168 mg/dL (ref 125–200)
HDL: 45 mg/dL — ABNORMAL LOW (ref >=46)
LDL Cholesterol: 112 mg/dL (ref <130)
Total CHOL/HDL Ratio: 3.7 Ratio (ref <=5.0)
Triglycerides: 57 mg/dL (ref <150)
VLDL: 11 mg/dL (ref <30)

## 2015-06-09 LAB — CBC WITH DIFFERENTIAL/PLATELET
BASOS ABS: 0.1 10*3/uL (ref 0.0–0.1)
Basophils Relative: 1 % (ref 0–1)
EOS ABS: 0.1 10*3/uL (ref 0.0–0.7)
EOS PCT: 1 % (ref 0–5)
HEMATOCRIT: 37.7 % (ref 36.0–46.0)
Hemoglobin: 12.7 g/dL (ref 12.0–15.0)
LYMPHS ABS: 2.1 10*3/uL (ref 0.7–4.0)
LYMPHS PCT: 40 % (ref 12–46)
MCH: 27.5 pg (ref 26.0–34.0)
MCHC: 33.7 g/dL (ref 30.0–36.0)
MCV: 81.6 fL (ref 78.0–100.0)
MONO ABS: 0.4 10*3/uL (ref 0.1–1.0)
MPV: 9.7 fL (ref 8.6–12.4)
Monocytes Relative: 8 % (ref 3–12)
Neutro Abs: 2.6 10*3/uL (ref 1.7–7.7)
Neutrophils Relative %: 50 % (ref 43–77)
PLATELETS: 361 10*3/uL (ref 150–400)
RBC: 4.62 MIL/uL (ref 3.87–5.11)
RDW: 13.6 % (ref 11.5–15.5)
WBC: 5.2 10*3/uL (ref 4.0–10.5)

## 2015-06-09 LAB — COMPREHENSIVE METABOLIC PANEL
ALK PHOS: 49 U/L (ref 33–130)
ALT: 23 U/L (ref 6–29)
AST: 20 U/L (ref 10–35)
Albumin: 4.5 g/dL (ref 3.6–5.1)
BILIRUBIN TOTAL: 0.3 mg/dL (ref 0.2–1.2)
BUN: 12 mg/dL (ref 7–25)
CALCIUM: 9.6 mg/dL (ref 8.6–10.4)
CO2: 25 mmol/L (ref 20–31)
CREATININE: 0.59 mg/dL (ref 0.50–0.99)
Chloride: 105 mmol/L (ref 98–110)
Glucose, Bld: 92 mg/dL (ref 65–99)
Potassium: 4.3 mmol/L (ref 3.5–5.3)
Sodium: 139 mmol/L (ref 135–146)
TOTAL PROTEIN: 7.4 g/dL (ref 6.1–8.1)

## 2015-06-09 LAB — GLUCOSE, POCT (MANUAL RESULT ENTRY): POC GLUCOSE: 100 mg/dL — AB (ref 70–99)

## 2015-06-09 MED ORDER — BLOOD GLUCOSE MONITOR KIT: PACK | Status: DC

## 2015-06-09 MED ORDER — OXYCODONE-ACETAMINOPHEN 10-325 MG PO TABS: 1.5000 | ORAL_TABLET | Freq: Two times a day (BID) | ORAL | Status: DC

## 2015-06-09 MED ORDER — CEFDINIR 300 MG PO CAPS: 600.0000 mg | ORAL_CAPSULE | Freq: Every day | ORAL | Status: AC

## 2015-06-09 NOTE — Progress Notes (Signed)
Patient ID: [name], female    DOB: Oct 15, 1955, 60 y.o.   MRN: [id number]  PCP: Elgene Coral, PA-C  Subjective:   Chief Complaint  Patient presents with  . Follow-up  . Diabetes    HPI Presents for evaluation of diabetes and chronic pain management since she left [facility].  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 Using lifestyle changes. Learned a lot at diabetes education sessions.  "My diabetes. It's waking me up in the middle of the night shaking" Doesn't have a glucometer, so doesn't know what her sugar is. Goes to the kitchen and gets some soda and something to eat. After a while, she starts feeling herself and allows herself to go back to sleep. Has happened 2-3 times.  One time she thinks it was high, and had tinnitus.  "I have sores in the coochie. Went to the GYN, he could not find anything. He did biopsies."  Increased urinary frequency. "I be drinking water like crazy.Then I start aching. The teeth, the gums, the eye sockets, the tips of the finger and toenails be aching. I take a pill and sit down somewhere and it gets better. I've found that if I eat greens, it helps. So I'm making sure I eat broccoli twice a week. And I eat a salad on the weekends. And keeps a bowl of boiled eggs in the refrigerator."  Doesn't take the statin every day, "I think it effects me."  Takes it QOD. Feels "shooting pains across the pacemaker. It's still sore, and the bra strap rubs across it. They put it in the wrong place."  Has an appointment with "the [facility] people next week. But they don't write for medicine."  She left pain management after numerous complaints abott he care provided there. Attempts to get her in elsewhere have failed. States that the physician told her, "If you don't let me give this injection in your spine, you won't get any more pills. You just want the pills to get high. You aren't going to make me  lose my license. I'm going to send [employer]." No communication has been received. Expresses concern for both HIPPA violation and racism. "I think they get the pills for themselves." Reports other patients complained that the physician was "nasty, rude. I don't need to be abused." States that there have been 5 receptionists since she started there in the summer.   Notes that she was started on Darvocet at age 51 due to terrible pain. In college, she was able to come off pain medicines, but the pain got worse again in her 30's.  Dr. Bernette Mayers and Dr. Ronnald Ramp are treating the skin changes on her foot after the injections she received and she is concerned that diabetes is contributing.  Thinks she has a sinus infection. Pain and swelling of the LEFT side of her nose and face. Very congested.   Since having her GB out, her stomach hurts a lot and she doesn't have an appetite and has lost weight.   Review of Systems As above. No CP, SOB, dizziness.   Wt Readings from Last 3 Encounters:  06/09/15 147 lb 9.6 oz (66.951 kg)  03/10/15 148 lb 3.2 oz (67.223 kg)  02/16/15 150 lb (68.04 kg)      Patient Active Problem List   Diagnosis Date Noted  . Psoriasis 03/10/2015  . Cholecystitis 02/16/2015  . Gallstones 02/15/2015  . Right foot pain 11/13/2014  .  Ethmoid sinusitis 11/13/2014  . Abdominal pain, epigastric 11/13/2014  . Vitamin D deficiency 11/13/2014  . 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 Sick sinus syndrome ([place]) 07/08/2014  . Chest pain at rest 07/05/2014  . Chronic rhinitis 10/22/2013  . Fatty liver 10/22/2013  . Interstitial cystitis 10/22/2013  . Hip pain, right 10/22/2013  . Chronic insomnia 09/25/2012  . HTN (hypertension) 09/25/2012  . Hyperlipidemia 09/25/2012  . Diabetes mellitus type 2, controlled, without complications ([place]) 27/78/2423  . GERD 12/04/2008  . History of colonic polyps 12/04/2008  . CONSTIPATION 10/21/2008  . Fibromyalgia 10/21/2008  . LACTOSE  INTOLERANCE 11/20/2007  . CHEST PAIN 11/20/2007  . Anxiety and depression 08/02/2007  . Osteoporosis 08/02/2007  . DIVERTICULOSIS, COLON 11/16/2006  . EXTERNAL HEMORRHOIDS 05/12/2005     Prior to Admission medications   Medication Sig Start Date End Date Taking? Authorizing Provider  albuterol (PROVENTIL HFA;VENTOLIN HFA) 108 (90 BASE) MCG/ACT inhaler Inhale 2 puffs into the lungs every 6 (six) hours as needed for wheezing. 08/15/13  Yes Kavita Bartl, PA-C  ALPRAZolam (XANAX) 1 MG tablet Take 1 tablet (1 mg total) by mouth at bedtime. 05/19/15  Yes Kyel Purk, PA-C  Azilsartan Medoxomil (EDARBI) 40 MG TABS Take 20 mg by mouth every morning.   Yes Historical Provider, MD  cholecalciferol (VITAMIN D) 1000 UNITS tablet Take 1 tablet (1,000 Units total) by mouth daily. 11/24/14  Yes Bobbiejo Ishikawa, PA-C  cyclobenzaprine (FLEXERIL) 10 MG tablet Take 10 mg by mouth 3 (three) times daily as needed for muscle spasms.   Yes Historical Provider, MD  Desoximetasone (TOPICORT) 0.25 % ointment Apply 1 application topically 2 (two) times daily as needed (for psoriasis). 11/13/14  Yes Pamlea Finder, PA-C  dexlansoprazole (DEXILANT) 60 MG capsule Take 60 mg by mouth daily as needed (for acid reflux).   Yes Historical Provider, MD  diazepam (VALIUM) 5 MG tablet Take 1 tablet (5 mg total) by mouth daily. Patient taking differently: Take 5 mg by mouth daily as needed for anxiety or muscle spasms.  07/18/13  Yes Shareeka Yim, PA-C  docusate sodium (COLACE) 100 MG capsule TAKE 1 CAPSULE (100 MG TOTAL) BY MOUTH DAILY AS NEEDED FOR MILD CONSTIPATION. 10/23/14  Yes Eliberto Sole, PA-C  escitalopram (LEXAPRO) 20 MG tablet Take 1 tablet (20 mg total) by mouth daily. 02/10/15  Yes Jack Mineau, PA-C  fenofibrate 160 MG tablet Take 1 tablet (160 mg total) by mouth daily. Patient taking differently: Take 160 mg by mouth at bedtime.  11/13/14  Yes Edyth Glomb, PA-C  fluticasone (FLONASE) 50 MCG/ACT nasal spray USE 2  SPRAYS INTO EACH NOSTRIL EVERY DAY 11/13/14  Yes Joselin Crandell, PA-C  furosemide (LASIX) 20 MG tablet Take 1 tablet (20 mg total) by mouth daily as needed (leg swelling). 10/22/13  Yes Yides Saidi, PA-C  ipratropium (ATROVENT) 0.03 % nasal spray USE 2 SPRAYS INTO THE NOSE EVERY 12 (TWELVE) HOURS. 03/10/15  Yes Shadae Reino, PA-C  ketoconazole (NIZORAL) 2 % cream Apply 1 application topically daily. 03/10/15  Yes Dorisann Schwanke, PA-C  loteprednol (LOTEMAX) 0.5 % ophthalmic suspension 4 (four) times daily.   Yes Historical Provider, MD  meloxicam (MOBIC) 15 MG tablet TAKE 1 TABLET (15 MG TOTAL) BY MOUTH DAILY. 12/29/14  Yes Tesslyn Baumert, PA-C  metaxalone (SKELAXIN) 800 MG tablet Take 400-800 mg by mouth daily as needed for pain (for muscle spasms).    Yes Historical Provider, MD  montelukast (SINGULAIR) 10 MG tablet Take 1 tablet (10 mg total) by mouth daily. 09/02/14  Yes Tenaya Hilyer, PA-C  Multiple Vitamin (MULTIVITAMIN) tablet Take 1 tablet by mouth daily.   Yes Historical Provider, MD  NITROSTAT 0.4 MG SL tablet Place 0.4 mg under the tongue every 5 (five) minutes as needed for chest pain. For chest pain. 11/24/11  Yes Historical Provider, MD  oxyCODONE-acetaminophen (PERCOCET) 10-325 MG per tablet Take 10-325 tablets by mouth every 6 (six) hours as needed for pain.  11/05/14  Yes Historical Provider, MD  pravastatin (PRAVACHOL) 20 MG tablet Take 1 tablet (20 mg total) by mouth daily. 11/24/14  Yes Nimesh Riolo, PA-C  Probiotic Product (PROBIOTIC DAILY) CAPS Take 1 capsule by mouth 2 (two) times a week.   Yes Historical Provider, MD  ranitidine (ZANTAC) 150 MG tablet Take 150 mg by mouth as needed for heartburn.    Yes Historical Provider, MD  terbinafine (LAMISIL) 250 MG tablet Take 250 mg by mouth daily.   Yes Historical Provider, MD  zolpidem (AMBIEN) 10 MG tablet Take 10 mg by mouth at bedtime as needed for sleep.  09/28/13  Yes Historical Provider, MD     Allergies  Allergen Reactions  .  Codeine Other (See Comments)    HALLUCINATIONS   . Propoxyphene N-Acetaminophen Other (See Comments)    HALLUCINATIONS  . Sulfonamide Derivatives Hives, Itching and Swelling  . Alka-Seltzer [Aspirin Effervescent] Other (See Comments)    "like she's fading away"  . Norvasc [Amlodipine Besylate]   . Iohexol Nausea Only    JITTERY    . Latex Hives and Itching       Objective:  Physical Exam  Constitutional: She is oriented to person, place, and time. She appears well-developed and well-nourished. No distress.  BP 120/86 mmHg  Pulse 80  Temp(Src) 98.4 F (36.9 C) (Oral)  Resp 16  Ht 5' 7.5" (1.715 m)  Wt 147 lb 9.6 oz (66.951 kg)  BMI 22.76 kg/m2  SpO2 98%   Eyes: Conjunctivae are normal. No scleral icterus.  Neck: No thyromegaly present.  Cardiovascular: Normal rate, regular rhythm, normal heart sounds and intact distal pulses.   Pulmonary/Chest: Effort normal and breath sounds normal.  Lymphadenopathy:    She has no cervical adenopathy.  Neurological: She is alert and oriented to person, place, and time.  Skin: Skin is warm and dry. Rash (LEFT foot, consistent with psoriasis and tinea pedis) noted.  Psychiatric: She has a normal mood and affect. Her behavior is normal.   Results for orders placed or performed in visit on 06/09/15  POCT glucose (manual entry)  Result Value Ref Range   POC Glucose 100 (A) 70 - 99 mg/dl  POCT glycosylated hemoglobin (Hb A1C)  Result Value Ref Range   Hemoglobin A1C 5.9            Assessment & Plan:   1. Controlled type 2 diabetes mellitus without complication, without long-term current use of insulin ([place]) Well controlled. Bedtime snack with protein to prevent overnight lows. Test glucose when she feels bad. Continue other healthy eating habits. Increase exercise as able. - POCT glucose (manual entry) - POCT glycosylated hemoglobin (Hb A1C) - Comprehensive metabolic panel - blood glucose meter kit and supplies KIT; Dispense  based on patient and insurance preference. Use daily as directed. ICD-10: E11.9  Dispense: 1 each; Refill: 0  2. Fibromyalgia I will resume providing her pain management. Hope to see reduced need with modalities provided at [facility]. She may call for prescriptions monthly, and I will see her back in 3-4 months. - oxyCODONE-acetaminophen (  PERCOCET) 10-325 MG tablet; Take 1.5 tablets by mouth 2 (two) times daily.  Dispense: 90 tablet; Refill: 0  3. Hyperlipidemia Await lab results. - Comprehensive metabolic panel - Lipid panel  4. Essential hypertension Controlled. - Comprehensive metabolic panel - CBC with Differential/Platelet  5. Acute recurrent maxillary sinusitis This is a chronic problem. Treat with cefdinir. - cefdinir (OMNICEF) 300 MG capsule; Take 2 capsules (600 mg total) by mouth daily.  Dispense: 20 capsule; Refill: 0   Return for re-evaluation in 3-4 months.   Fara Chute, PA-C Physician Assistant-Certified [facility]

## 2015-06-09 NOTE — Patient Instructions (Signed)
I will contact you with your lab results as soon as they are available.   If you have not heard from me in 2 weeks, please contact me.  The fastest way to get your results is to register for My Chart (see the instructions on the last page of this printout).  You glucose appears very well controlled. Please check it when you feel bad and record the results. It may be that we need to make sure you're having a healthy snack with protein at bedtime to prevent the sugar from dropping overnight.  You can contact me monthly for your pain medication. Let's see each other again in 3-4 months.

## 2015-06-26 ENCOUNTER — Telehealth: Payer: Self-pay | Admitting: Physician Assistant

## 2015-06-26 ENCOUNTER — Encounter: Payer: Self-pay | Admitting: Family Medicine

## 2015-06-26 DIAGNOSIS — F32A Depression, unspecified: Secondary | ICD-10-CM

## 2015-06-26 DIAGNOSIS — F329 Major depressive disorder, single episode, unspecified: Secondary | ICD-10-CM

## 2015-06-26 DIAGNOSIS — F419 Anxiety disorder, unspecified: Secondary | ICD-10-CM

## 2015-06-26 DIAGNOSIS — E785 Hyperlipidemia, unspecified: Secondary | ICD-10-CM

## 2015-06-26 MED ORDER — PRAVASTATIN SODIUM 40 MG PO TABS: 40.0000 mg | ORAL_TABLET | Freq: Every day | ORAL | Status: DC

## 2015-06-26 NOTE — Telephone Encounter (Signed)
Please call this patient.  The FDA has determined that the benefit of taking fenofibrate and statins together no longer outweigh the risk.  Please STOP: fenofibrate. CONTINUE: pravastatin, but INCREASE from 20 mg to 40 mg. She can use up what she has by taking 2 of the 20 mg. I will send a new Rx for the 40 mg to the pharmacy.  No orders of the defined types were placed in this encounter.

## 2015-06-30 NOTE — Telephone Encounter (Signed)
She also was told to take Vitamin C 3000 mg and Magnesium liquid to run to rub on her for pain. Please advise if you think this is ok.

## 2015-06-30 NOTE — Telephone Encounter (Signed)
Left message for pt to call back  °

## 2015-06-30 NOTE — Telephone Encounter (Signed)
Spoke with pt, advised message. Pt understood. She states [facility] advised her to take Vitamin D 10,000 units per day. The doctor name is Dr. Ace Gins. She needs a Rx refill on Xanax. She states she does not feel good at all. She is taking all her medications. She states she is in pain and tired. Please advise on Xanax and Vit D.

## 2015-07-01 MED ORDER — ALPRAZOLAM 1 MG PO TABS: 1.0000 mg | ORAL_TABLET | Freq: Every day | ORAL | Status: DC

## 2015-07-01 NOTE — Telephone Encounter (Signed)
rx for alprazolam faxed [facility] and [facility] that that has been done and it was ok to use the vit d, vit c, and magnesium liquid

## 2015-07-01 NOTE — Telephone Encounter (Signed)
Meds ordered this encounter  Medications  . pravastatin (PRAVACHOL) 40 MG tablet    Sig: Take 1 tablet (40 mg total) by mouth daily.    Dispense:  90 tablet    Refill:  3    Order Specific Question:  Supervising Provider    Answer:  DOOLITTLE, ROBERT P [[id number]]  . ALPRAZolam (XANAX) 1 MG tablet    Sig: Take 1 tablet (1 mg total) by mouth at bedtime.    Dispense:  60 tablet    Refill:  0    Not to exceed 5 additional fills before 11/03/2014.    Order Specific Question:  Supervising Provider    Answer:  Leandrew Koyanagi [[id number]]    I trust Dr. Unice Bailey recommendations for Vitamin D, Vitamin C and magnesium liquid.

## 2015-07-02 ENCOUNTER — Encounter: Payer: Self-pay | Admitting: Family Medicine

## 2015-07-21 ENCOUNTER — Encounter: Payer: Self-pay | Admitting: Physician Assistant

## 2015-07-21 DIAGNOSIS — M797 Fibromyalgia: Secondary | ICD-10-CM

## 2015-07-21 MED ORDER — OXYCODONE-ACETAMINOPHEN 10-325 MG PO TABS: 1.5000 | ORAL_TABLET | Freq: Two times a day (BID) | ORAL | Status: DC

## 2015-07-21 NOTE — Telephone Encounter (Signed)
Patient notified via My Chart.  Rx printed at 104. Will bring to 102 after clinic.  Meds ordered this encounter  Medications  . oxyCODONE-acetaminophen (PERCOCET) 10-325 MG tablet    Sig: Take 1.5 tablets by mouth 2 (two) times daily.    Dispense:  90 tablet    Refill:  0    Order Specific Question:  Supervising Provider    Answer:  DOOLITTLE, ROBERT P [[id number]]

## 2015-07-22 NOTE — Telephone Encounter (Signed)
Rx in drawer. Pt aware.

## 2015-07-29 ENCOUNTER — Telehealth: Payer: Self-pay

## 2015-07-29 NOTE — Telephone Encounter (Signed)
Patient is calling because [employer] never received the part of the paperwork for disability regarding the attending physician statement. Patient states it needs to be re faxed and if we don't have a copy then she'll bring [place] another one.

## 2015-07-31 NOTE — Telephone Encounter (Signed)
refaxed all paperwork over to [facility] and got a conformation that they received them on 07/30/15

## 2015-08-04 ENCOUNTER — Ambulatory Visit (INDEPENDENT_AMBULATORY_CARE_PROVIDER_SITE_OTHER): Payer: 59 | Admitting: Psychiatry

## 2015-08-04 DIAGNOSIS — F331 Major depressive disorder, recurrent, moderate: Secondary | ICD-10-CM | POA: Diagnosis not present

## 2015-08-13 ENCOUNTER — Encounter: Payer: Medicare Other | Admitting: *Deleted

## 2015-08-14 ENCOUNTER — Encounter: Payer: Self-pay | Admitting: Cardiology

## 2015-08-18 ENCOUNTER — Encounter: Payer: Self-pay | Admitting: Physician Assistant

## 2015-08-18 DIAGNOSIS — J310 Chronic rhinitis: Secondary | ICD-10-CM

## 2015-08-18 DIAGNOSIS — M797 Fibromyalgia: Secondary | ICD-10-CM

## 2015-08-20 MED ORDER — FLUTICASONE PROPIONATE 50 MCG/ACT NA SUSP: 2.0000 | Freq: Every day | NASAL | Status: DC

## 2015-08-20 MED ORDER — OXYCODONE-ACETAMINOPHEN 10-325 MG PO TABS: 1.5000 | ORAL_TABLET | Freq: Two times a day (BID) | ORAL | Status: DC

## 2015-08-20 MED ORDER — FLUTICASONE PROPIONATE 50 MCG/ACT NA SUSP: NASAL | Status: DC

## 2015-08-20 NOTE — Telephone Encounter (Signed)
Done - pt knows through my chart

## 2015-08-21 NOTE — Telephone Encounter (Signed)
Rx in drawer.

## 2015-08-25 ENCOUNTER — Ambulatory Visit (INDEPENDENT_AMBULATORY_CARE_PROVIDER_SITE_OTHER): Payer: 59 | Admitting: Psychiatry

## 2015-08-25 DIAGNOSIS — F331 Major depressive disorder, recurrent, moderate: Secondary | ICD-10-CM | POA: Diagnosis not present

## 2015-09-22 ENCOUNTER — Ambulatory Visit (INDEPENDENT_AMBULATORY_CARE_PROVIDER_SITE_OTHER): Payer: 59 | Admitting: Psychiatry

## 2015-09-22 ENCOUNTER — Other Ambulatory Visit: Payer: Self-pay | Admitting: Physician Assistant

## 2015-09-22 ENCOUNTER — Encounter: Payer: Self-pay | Admitting: Physician Assistant

## 2015-09-22 DIAGNOSIS — F064 Anxiety disorder due to known physiological condition: Secondary | ICD-10-CM | POA: Diagnosis not present

## 2015-09-22 DIAGNOSIS — M797 Fibromyalgia: Secondary | ICD-10-CM

## 2015-09-24 MED ORDER — OXYCODONE-ACETAMINOPHEN 10-325 MG PO TABS: 1.5000 | ORAL_TABLET | Freq: Two times a day (BID) | ORAL | Status: DC

## 2015-09-24 NOTE — Telephone Encounter (Signed)
Patient notified via My Chart.   Meds ordered this encounter  Medications  . oxyCODONE-acetaminophen (PERCOCET) 10-325 MG tablet    Sig: Take 1.5 tablets by mouth 2 (two) times daily.    Dispense:  90 tablet    Refill:  0    Order Specific Question:  Supervising Provider    Answer:  DOOLITTLE, ROBERT P [[id number]]

## 2015-09-29 ENCOUNTER — Ambulatory Visit (INDEPENDENT_AMBULATORY_CARE_PROVIDER_SITE_OTHER): Payer: Medicare Other | Admitting: *Deleted

## 2015-09-29 DIAGNOSIS — I495 Sick sinus syndrome: Secondary | ICD-10-CM | POA: Diagnosis not present

## 2015-09-29 NOTE — Progress Notes (Signed)
Remote pacemaker transmission.   

## 2015-09-30 ENCOUNTER — Encounter: Payer: Self-pay | Admitting: Physician Assistant

## 2015-10-06 ENCOUNTER — Encounter: Payer: Self-pay | Admitting: Physician Assistant

## 2015-10-06 ENCOUNTER — Ambulatory Visit (INDEPENDENT_AMBULATORY_CARE_PROVIDER_SITE_OTHER): Payer: Medicare Other | Admitting: Physician Assistant

## 2015-10-06 VITALS — BP 108/82 | HR 69 | Temp 97.9°F | Resp 16 | Ht 67.25 in | Wt 155.4 lb

## 2015-10-06 DIAGNOSIS — I10 Essential (primary) hypertension: Secondary | ICD-10-CM

## 2015-10-06 DIAGNOSIS — E559 Vitamin D deficiency, unspecified: Secondary | ICD-10-CM | POA: Diagnosis not present

## 2015-10-06 DIAGNOSIS — F419 Anxiety disorder, unspecified: Secondary | ICD-10-CM

## 2015-10-06 DIAGNOSIS — F329 Major depressive disorder, single episode, unspecified: Secondary | ICD-10-CM

## 2015-10-06 DIAGNOSIS — F32A Depression, unspecified: Secondary | ICD-10-CM

## 2015-10-06 DIAGNOSIS — E119 Type 2 diabetes mellitus without complications: Secondary | ICD-10-CM | POA: Diagnosis not present

## 2015-10-06 DIAGNOSIS — E785 Hyperlipidemia, unspecified: Secondary | ICD-10-CM

## 2015-10-06 DIAGNOSIS — F418 Other specified anxiety disorders: Secondary | ICD-10-CM

## 2015-10-06 LAB — COMPREHENSIVE METABOLIC PANEL
ALK PHOS: 61 U/L (ref 33–130)
ALT: 32 U/L — AB (ref 6–29)
AST: 22 U/L (ref 10–35)
Albumin: 4.5 g/dL (ref 3.6–5.1)
BUN: 10 mg/dL (ref 7–25)
CALCIUM: 9.9 mg/dL (ref 8.6–10.4)
CO2: 22 mmol/L (ref 20–31)
Chloride: 104 mmol/L (ref 98–110)
Creat: 0.65 mg/dL (ref 0.50–0.99)
Glucose, Bld: 99 mg/dL (ref 65–99)
POTASSIUM: 4.6 mmol/L (ref 3.5–5.3)
Sodium: 140 mmol/L (ref 135–146)
TOTAL PROTEIN: 8 g/dL (ref 6.1–8.1)
Total Bilirubin: 0.5 mg/dL (ref 0.2–1.2)

## 2015-10-06 LAB — LIPID PANEL
CHOL/HDL RATIO: 4 ratio (ref <=5.0)
CHOLESTEROL: 162 mg/dL (ref 125–200)
HDL: 41 mg/dL — AB (ref >=46)
LDL Cholesterol: 104 mg/dL (ref <130)
TRIGLYCERIDES: 85 mg/dL (ref <150)
VLDL: 17 mg/dL (ref <30)

## 2015-10-06 LAB — HEMOGLOBIN A1C
HEMOGLOBIN A1C: 6 % — AB (ref <5.7)
Mean Plasma Glucose: 126 mg/dL

## 2015-10-06 MED ORDER — ESCITALOPRAM OXALATE 20 MG PO TABS: 20.0000 mg | ORAL_TABLET | Freq: Every day | ORAL | Status: DC

## 2015-10-06 MED ORDER — BUSPIRONE HCL 15 MG PO TABS: ORAL_TABLET | ORAL | Status: DC

## 2015-10-06 MED ORDER — VITAMIN D 1000 UNITS PO TABS: 1000.0000 [IU] | ORAL_TABLET | Freq: Every day | ORAL | Status: DC

## 2015-10-06 NOTE — Patient Instructions (Signed)
     IF you received an x-ray today, you will receive an invoice from [facility]. Please contact [facility] at [phone] with questions or concerns regarding your invoice.   IF you received labwork today, you will receive an invoice from [facility]. Please contact [facility] at [phone] with questions or concerns regarding your invoice.   Our billing staff will not be able to assist you with questions regarding bills from these companies.  You will be contacted with the lab results as soon as they are available. The fastest way to get your results is to activate your My Chart account. Instructions are located on the last page of this paperwork. If you have not heard from us regarding the results in 2 weeks, please contact this office.      

## 2015-10-06 NOTE — Progress Notes (Signed)
Patient ID: [name], female    DOB: [birth year], 60 y.o.   MRN: [id number]  PCP: Koby Hartfield, PA-C  Subjective:   Chief Complaint  Patient presents with  . Follow-up  . Diabetes  . Depression    depression score during triage, score 22, pt is crying    HPI Presents for evaluation of diabetes, depression and chronic pain.  Continues to have pain in various places-neck, back, feet.  Was seen for evaluation at [facility], and was advised she was a good candidate for Pain Management. She won't go back to Pain Management due to being treated like an addict, like she's lazy. Felt degraded. She gets trigger point injections with Dr. Estanislado Pandy. Sometimes her muscles lock up and she can't move. Happens all of a sudden. Afraid that if it happens when she's out, she won't have help. Weather changes make it worse. Unable to do housework, which is a problem as it exacerbates her OCD symptoms when her home isn't immaculate. Some days she can't get out of bed.  Trying to hold out on hiring someone to come in an help her several days/week.  Dr. Doylene Canard gave her a B12 injection in March, which she thinks helped her some.  Feels abandoned by her family. "not one of them has come to see about me" despite multiple major surgeries in the past several years.  Her daughter and granddaughter come by regularly and help her. Continues to see her therapist. Trying to walk more when she can. Can make it 1-1.5 blocks, then comes back. "I live for my daughter and my grandbaby. If it wasn't for them, I'd just stay in the bed and die. I hate that my grandbaby has to see me like that." Her granddaughter says, "Well, I'd rather have a sick MeeMaw than a dead MeeMaw." "I thought about a service dog. I thought that might help me. My yard is fenced in." Her daughter's dog "visits" her sometimes, and she really enjoys the companionship.  "I been having sugar attacks." She thinks that her glucose  drops over night, but hasn't filled the prescription for the glucometer yet. Eats a snack before bed, with protein. Last night she ate 1/2 a meatloaf sandwich, but that caused a stomach ache.   Review of Systems As above.    Patient Active Problem List   Diagnosis Date Noted  . Psoriasis 03/10/2015  . Cholecystitis 02/16/2015  . Gallstones 02/15/2015  . Right foot pain 11/13/2014  . Ethmoid sinusitis 11/13/2014  . Abdominal pain, epigastric 11/13/2014  . Vitamin D deficiency 11/13/2014  . 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 Sick sinus syndrome ([place]) 07/08/2014  . Chest pain at rest 07/05/2014  . Chronic rhinitis 10/22/2013  . Fatty liver 10/22/2013  . Interstitial cystitis 10/22/2013  . Hip pain, right 10/22/2013  . Chronic insomnia 09/25/2012  . HTN (hypertension) 09/25/2012  . Hyperlipidemia 09/25/2012  . Diabetes mellitus type 2, controlled, without complications ([place]) 27/74/1287  . GERD 12/04/2008  . History of colonic polyps 12/04/2008  . CONSTIPATION 10/21/2008  . Fibromyalgia 10/21/2008  . LACTOSE INTOLERANCE 11/20/2007  . CHEST PAIN 11/20/2007  . Anxiety and depression 08/02/2007  . Osteoporosis 08/02/2007  . DIVERTICULOSIS, COLON 11/16/2006  . EXTERNAL HEMORRHOIDS 05/12/2005     Prior to Admission medications   Medication Sig Start Date End Date Taking? Authorizing Provider  albuterol (PROVENTIL HFA;VENTOLIN HFA) 108 (90 BASE) MCG/ACT inhaler Inhale 2 puffs into the lungs every 6 (six) hours as needed for wheezing. 08/15/13  Yes Richardson Dubree, PA-C  ALPRAZolam (XANAX) 1 MG tablet Take 1 tablet (1 mg total) by mouth at bedtime. 07/01/15  Yes Charlsey Moragne, PA-C  cholecalciferol (VITAMIN D) 1000 UNITS tablet Take 1 tablet (1,000 Units total) by mouth daily. 11/24/14  Yes Carthel Castille, PA-C  cyclobenzaprine (FLEXERIL) 10 MG tablet Take 10 mg by mouth 3 (three) times daily as needed for muscle spasms.   Yes Historical Provider, MD  dexlansoprazole (DEXILANT) 60 MG  capsule Take 60 mg by mouth daily as needed (for acid reflux).   Yes Historical Provider, MD  diazepam (VALIUM) 5 MG tablet Take 1 tablet (5 mg total) by mouth daily. Patient taking differently: Take 5 mg by mouth daily as needed for anxiety or muscle spasms.  07/18/13  Yes Shauntel Prest, PA-C  docusate sodium (COLACE) 100 MG capsule TAKE 1 CAPSULE (100 MG TOTAL) BY MOUTH DAILY AS NEEDED FOR MILD CONSTIPATION. 10/23/14  Yes Alaiya Martindelcampo, PA-C  escitalopram (LEXAPRO) 20 MG tablet Take 1 tablet (20 mg total) by mouth daily. 02/10/15  Yes Charee Tumblin, PA-C  fluticasone (FLONASE) 50 MCG/ACT nasal spray Place 2 sprays into both nostrils daily. 08/20/15  Yes Mancel Bale, PA-C  fluticasone [facility]) 50 MCG/ACT nasal spray USE 2 SPRAYS INTO EACH NOSTRIL EVERY DAY 08/20/15  Yes Mancel Bale, PA-C  furosemide (LASIX) 20 MG tablet Take 1 tablet (20 mg total) by mouth daily as needed (leg swelling). 10/22/13  Yes Lorri Fukuhara, PA-C  ipratropium (ATROVENT) 0.03 % nasal spray USE 2 SPRAYS INTO THE NOSE EVERY 12 (TWELVE) HOURS. 03/10/15  Yes Reneisha Stilley, PA-C  ketoconazole (NIZORAL) 2 % cream Apply 1 application topically daily. 03/10/15  Yes Tamu Golz, PA-C  loteprednol (LOTEMAX) 0.5 % ophthalmic suspension 4 (four) times daily.   Yes Historical Provider, MD  meloxicam (MOBIC) 15 MG tablet TAKE 1 TABLET (15 MG TOTAL) BY MOUTH DAILY. 12/29/14  Yes Korben Carcione, PA-C  montelukast (SINGULAIR) 10 MG tablet Take 1 tablet (10 mg total) by mouth daily. 09/02/14  Yes Kyrollos Cordell, PA-C  Multiple Vitamin (MULTIVITAMIN) tablet Take 1 tablet by mouth daily.   Yes Historical Provider, MD  NITROSTAT 0.4 MG SL tablet Place 0.4 mg under the tongue every 5 (five) minutes as needed for chest pain. For chest pain. 11/24/11  Yes Historical Provider, MD  oxyCODONE-acetaminophen (PERCOCET) 10-325 MG tablet Take 1.5 tablets by mouth 2 (two) times daily. 09/24/15  Yes Murat Rideout, PA-C  pravastatin (PRAVACHOL) 40 MG tablet  Take 1 tablet (40 mg total) by mouth daily. 06/26/15  Yes Breven Guidroz, PA-C  Probiotic Product (PROBIOTIC DAILY) CAPS Take 1 capsule by mouth 2 (two) times a week.   Yes Historical Provider, MD  ranitidine (ZANTAC) 150 MG tablet Take 150 mg by mouth as needed for heartburn.    Yes Historical Provider, MD  zolpidem (AMBIEN) 10 MG tablet Take 10 mg by mouth at bedtime as needed for sleep.  09/28/13  Yes Historical Provider, MD  Azilsartan Medoxomil (EDARBI) 40 MG TABS Take 20 mg by mouth every morning.    Historical Provider, MD  blood glucose meter kit and supplies KIT Dispense based on patient and insurance preference. Use daily as directed. ICD-10: E11.9 Patient not taking: Reported on 10/06/2015 06/09/15   [name], PA-C  Desoximetasone (TOPICORT) 0.25 % ointment Apply 1 application topically 2 (two) times daily as needed (for psoriasis). Patient not taking: Reported on 10/06/2015 11/13/14   [name], PA-C  metaxalone (SKELAXIN) 800 MG tablet Take 400-800  mg by mouth daily as needed for pain (for muscle spasms). Reported on 10/06/2015    Historical Provider, MD  terbinafine (LAMISIL) 250 MG tablet Take 250 mg by mouth daily. Reported on 10/06/2015    Historical Provider, MD     Allergies  Allergen Reactions  . Codeine Other (See Comments)    HALLUCINATIONS   . Propoxyphene N-Acetaminophen Other (See Comments)    HALLUCINATIONS  . Sulfonamide Derivatives Hives, Itching and Swelling  . Alka-Seltzer [Aspirin Effervescent] Other (See Comments)    "like she's fading away"  . Norvasc [Amlodipine Besylate]   . Iohexol Nausea Only    JITTERY    . Latex Hives and Itching       Objective:  Physical Exam  Constitutional: She is oriented to person, place, and time. She appears well-developed and well-nourished. She is active and cooperative. No distress.  BP 108/82 mmHg  Pulse 69  Temp(Src) 97.9 F (36.6 C) (Oral)  Resp 16  Ht 5' 7.25" (1.708 m)  Wt 155 lb 6.4 oz (70.489 kg)  BMI  24.16 kg/m2  SpO2 99%  HENT:  Head: Normocephalic and atraumatic.  Right Ear: Hearing normal.  Left Ear: Hearing normal.  Eyes: Conjunctivae are normal. No scleral icterus.  Neck: Normal range of motion. Neck supple. No thyromegaly present.  Cardiovascular: Normal rate, regular rhythm and normal heart sounds.   Pulses:      Radial pulses are 2+ on the right side, and 2+ on the left side.  Pulmonary/Chest: Effort normal and breath sounds normal.  Musculoskeletal:  Tender all over. Moves gingerly in the exam room.  Lymphadenopathy:       Head (right side): No tonsillar, no preauricular, no posterior auricular and no occipital adenopathy present.       Head (left side): No tonsillar, no preauricular, no posterior auricular and no occipital adenopathy present.    She has no cervical adenopathy.       Right: No supraclavicular adenopathy present.       Left: No supraclavicular adenopathy present.  Neurological: She is alert and oriented to person, place, and time. No sensory deficit.  Skin: Skin is warm, dry and intact. No rash noted. No cyanosis or erythema. Nails show no clubbing.  Psychiatric: Her speech is normal and behavior is normal. Judgment and thought content normal. Her mood appears anxious. Cognition and memory are normal. She exhibits a depressed mood.           Assessment & Plan:   1. Essential hypertension Controlled. - Comprehensive metabolic panel  2. Controlled type 2 diabetes mellitus without complication, without long-term current use of insulin ([place]) Await lab results. Again encouraged her to check the glucose when it feels like it's too low or too high. We may need to adjust her regimen. - Comprehensive metabolic panel - [facility] Diabetes Foot Exam - Hemoglobin A1c  3. Hyperlipidemia Await lab results.  - Comprehensive metabolic panel - Lipid panel  4. Anxiety and depression Stable, but not well controlled. Add Buspar. She has done well on it in the past.  Start with 15 mg tablets, 1/3 tablet BID, titrating up to 1 tablet BID over 4 weeks. - escitalopram (LEXAPRO) 20 MG tablet; Take 1 tablet (20 mg total) by mouth daily.  Dispense: 90 tablet; Refill: 3  5. Vitamin D deficiency Update level. - cholecalciferol (VITAMIN D) 1000 units tablet; Take 1 tablet (1,000 Units total) by mouth daily.  Dispense: 90 tablet; Refill: 3   Return in about 3  months (around 01/06/2016) for Physical/Annual Exam.    Fara Chute, PA-C Physician Assistant-Certified [facility]

## 2015-10-08 ENCOUNTER — Encounter: Payer: Self-pay | Admitting: Physician Assistant

## 2015-10-16 ENCOUNTER — Other Ambulatory Visit: Payer: Self-pay | Admitting: Physician Assistant

## 2015-10-20 ENCOUNTER — Ambulatory Visit (INDEPENDENT_AMBULATORY_CARE_PROVIDER_SITE_OTHER): Payer: 59 | Admitting: Psychiatry

## 2015-10-20 DIAGNOSIS — F331 Major depressive disorder, recurrent, moderate: Secondary | ICD-10-CM | POA: Diagnosis not present

## 2015-10-24 ENCOUNTER — Encounter: Payer: Self-pay | Admitting: Physician Assistant

## 2015-10-24 DIAGNOSIS — M797 Fibromyalgia: Secondary | ICD-10-CM

## 2015-10-24 DIAGNOSIS — F411 Generalized anxiety disorder: Secondary | ICD-10-CM

## 2015-10-24 DIAGNOSIS — F5104 Psychophysiologic insomnia: Secondary | ICD-10-CM

## 2015-10-29 ENCOUNTER — Encounter: Payer: Self-pay | Admitting: Physician Assistant

## 2015-10-29 MED ORDER — DIAZEPAM 5 MG PO TABS: 5.0000 mg | ORAL_TABLET | Freq: Every day | ORAL | Status: DC | PRN

## 2015-10-29 MED ORDER — OXYCODONE-ACETAMINOPHEN 10-325 MG PO TABS: 1.5000 | ORAL_TABLET | Freq: Two times a day (BID) | ORAL | Status: DC

## 2015-10-29 NOTE — Telephone Encounter (Signed)
Patient notified via My Chart.  Meds ordered this encounter  Medications  . diazepam (VALIUM) 5 MG tablet    Sig: Take 1 tablet (5 mg total) by mouth daily as needed for anxiety or muscle spasms.    Dispense:  30 tablet    Refill:  0    Order Specific Question:  Supervising Provider    Answer:  DOOLITTLE, ROBERT P [[id number]]  . oxyCODONE-acetaminophen (PERCOCET) 10-325 MG tablet    Sig: Take 1.5 tablets by mouth 2 (two) times daily.    Dispense:  90 tablet    Refill:  0    Order Specific Question:  Supervising Provider    Answer:  DOOLITTLE, ROBERT P [[id number]]

## 2015-10-30 NOTE — Telephone Encounter (Signed)
Notified pt on mychart Rxs ready.

## 2015-11-05 LAB — CUP PACEART REMOTE DEVICE CHECK
Brady Statistic AP VP Percent: 0.07 %
Brady Statistic AS VP Percent: 0 %
Brady Statistic AS VS Percent: 0.56 %
Date Time Interrogation Session: 20170425130259
Implantable Lead Implant Date: 20160202
Implantable Lead Location: 753859
Implantable Lead Model: 5076
Lead Channel Impedance Value: 399 Ohm
Lead Channel Impedance Value: 494 Ohm
Lead Channel Pacing Threshold Amplitude: 0.875 V
Lead Channel Pacing Threshold Pulse Width: 0.4 ms
Lead Channel Sensing Intrinsic Amplitude: 22.75 mV
Lead Channel Setting Pacing Amplitude: 2.5 V
Lead Channel Setting Pacing Pulse Width: 0.4 ms
MDC IDC LEAD IMPLANT DT: 20160202
MDC IDC LEAD LOCATION: 753860
MDC IDC MSMT BATTERY REMAINING LONGEVITY: 104 mo
MDC IDC MSMT BATTERY VOLTAGE: 3.01 V
MDC IDC MSMT LEADCHNL RA PACING THRESHOLD AMPLITUDE: 0.5 V
MDC IDC MSMT LEADCHNL RA PACING THRESHOLD PULSEWIDTH: 0.4 ms
MDC IDC MSMT LEADCHNL RA SENSING INTR AMPL: 2.875 mV
MDC IDC MSMT LEADCHNL RA SENSING INTR AMPL: 2.875 mV
MDC IDC MSMT LEADCHNL RV IMPEDANCE VALUE: 456 Ohm
MDC IDC MSMT LEADCHNL RV IMPEDANCE VALUE: 551 Ohm
MDC IDC MSMT LEADCHNL RV SENSING INTR AMPL: 22.75 mV
MDC IDC SET LEADCHNL RA PACING AMPLITUDE: 2 V
MDC IDC SET LEADCHNL RV SENSING SENSITIVITY: 2.8 mV
MDC IDC STAT BRADY AP VS PERCENT: 99.38 %
MDC IDC STAT BRADY RA PERCENT PACED: 99.44 %
MDC IDC STAT BRADY RV PERCENT PACED: 0.07 %

## 2015-11-06 ENCOUNTER — Encounter: Payer: Self-pay | Admitting: Cardiology

## 2015-11-06 ENCOUNTER — Encounter: Payer: Self-pay | Admitting: Physician Assistant

## 2015-11-09 NOTE — Telephone Encounter (Signed)
Patient notified via My Chart.  I've completed the form and put it in the bin at the front desk. The patient needs to sign her side.

## 2015-11-12 ENCOUNTER — Encounter: Payer: Self-pay | Admitting: Nurse Practitioner

## 2015-11-13 ENCOUNTER — Encounter: Payer: Self-pay | Admitting: Internal Medicine

## 2015-11-13 ENCOUNTER — Encounter: Payer: Self-pay | Admitting: Nurse Practitioner

## 2015-11-13 ENCOUNTER — Ambulatory Visit (INDEPENDENT_AMBULATORY_CARE_PROVIDER_SITE_OTHER): Payer: Medicare Other | Admitting: Nurse Practitioner

## 2015-11-13 VITALS — BP 118/60 | HR 70 | Ht 67.5 in | Wt 163.4 lb

## 2015-11-13 DIAGNOSIS — I495 Sick sinus syndrome: Secondary | ICD-10-CM | POA: Diagnosis not present

## 2015-11-13 DIAGNOSIS — I10 Essential (primary) hypertension: Secondary | ICD-10-CM | POA: Diagnosis not present

## 2015-11-13 LAB — CUP PACEART INCLINIC DEVICE CHECK
Implantable Lead Location: 753859
Implantable Lead Model: 5076
Implantable Lead Model: 5076
MDC IDC LEAD IMPLANT DT: 20160202
MDC IDC LEAD IMPLANT DT: 20160202
MDC IDC LEAD LOCATION: 753860
MDC IDC SESS DTM: 20170609134016

## 2015-11-13 NOTE — Patient Instructions (Signed)
Medication Instructions:   Your physician recommends that you continue on your current medications as directed. Please refer to the Current Medication list given to you today.   If you need a refill on your cardiac medications before your next appointment, please call your pharmacy.  Labwork: NONE ORDER TODAY   Testing/Procedures:  NONE ORDER TODAY    Follow-Up: .Remote monitoring is used to monitor your Pacemaker of ICD from home. This monitoring reduces the number of office visits required to check your device to one time per year. It allows [place] to keep an eye on the functioning of your device to ensure it is working properly. You are scheduled for a device check from home on 02/12/2016..You may send your transmission at any time that day. If you have a wireless device, the transmission will be sent automatically. After your physician reviews your transmission, you will receive a postcard with your next transmission date.   Your physician wants you to follow-up in: [place].Marland Kitchen You will receive a reminder letter in the mail two months in advance. If you don't receive a letter, please call our office to schedule the follow-up appointment.    Any Other Special Instructions Will Be Listed Below (If Applicable).

## 2015-11-13 NOTE — Progress Notes (Signed)
Electrophysiology Office Note Date: 11/13/2015  ID:  Grant, Alicia September 23, 1955, MRN [id number]  PCP: [facility] Primary Cardiologist: Doylene Canard Electrophysiologist: Allred  CC: Pacemaker follow-up  [name] is a 60 y.o. female seen today for Dr Rayann Heman.  She presents today for routine electrophysiology followup.  Since last being seen in our clinic, the patient reports doing very well.  She has chronic tenderness over pacemaker site that hasn't changed since device implant. She also has multiple chronc somatic complaints. She denies chest pain, palpitations, dyspnea, PND, orthopnea, nausea, vomiting, dizziness, syncope, edema, weight gain, or early satiety.  Device History: MDT MRI compatible dual chamber PPM implanted 2016 for SSS   Past Medical History  Diagnosis Date  . GERD (gastroesophageal reflux disease)   . Arthritis   . Anxiety   . Fibromyalgia   . Bursitis     right hip  . Anal fissure 02/2012  . Hypertension   . Tarsal tunnel syndrome of left side   . Complication of anesthesia     difficult to wake up post-op  . Depression   . Asthma   . Irritable bowel syndrome (IBS)   . TMJ (temporomandibular joint syndrome)     left  . Hyperlipidemia   . Hepatitis A infection 1979  . 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 Allergy   . Diabetes mellitus without complication [facility])    Past Surgical History  Procedure Laterality Date  . Shoulder arthroscopy w/ rotator cuff repair Left 1999  . Bilateral salpingoophorectomy Bilateral 04/2011  . Cystoscopy w/ ureteral stent placement  08/04/2009  . Laparoscopic lysis intestinal adhesions  08/04/2009  . Cardiac catheterization  10/08/2007  . Examination under anesthesia  02/17/2012    Procedure: EXAM UNDER ANESTHESIA;  Surgeon: Pedro Earls, MD;  Location: [facility];  Service: General;  Laterality: N/A;  Exam under anesthesia with lateral  internal sphincterotomy  . Sphincterotomy  02/17/2012    Procedure: SPHINCTEROTOMY;  Surgeon: Pedro Earls, MD;  Location: [facility];  Service: General;  Laterality: N/A;  . Abdominal hysterectomy  09/1994    partial  . Appendectomy    . Cesarean section    . Small intestine surgery    . Permanent pacemaker insertion N/A 07/08/2014    MDT MRI compatible dual chamber pacemaker implanted by Dr Rayann Heman for symptomatic bradycardia  . Cholecystectomy N/A 02/16/2015    Procedure: LAPAROSCOPIC CHOLECYSTECTOMY WITH INTRAOPERATIVE CHOLANGIOGRAM;  Surgeon: Johnathan Hausen, MD;  Location: [facility];  Service: General;  Laterality: N/A;    Current Outpatient Prescriptions  Medication Sig Dispense Refill  . albuterol (PROVENTIL HFA;VENTOLIN HFA) 108 (90 BASE) MCG/ACT inhaler Inhale 2 puffs into the lungs every 6 (six) hours as needed for wheezing. 18 g 2  . Azilsartan Medoxomil (EDARBI) 40 MG TABS Take 20 mg by mouth every morning.    . blood glucose meter kit and supplies KIT Dispense based on patient and insurance preference. Use daily as directed. ICD-10: E11.9 1 each 0  . cholecalciferol (VITAMIN D) 1000 units tablet Take 1 tablet (1,000 Units total) by mouth daily. 90 tablet 3  . cyclobenzaprine (FLEXERIL) 10 MG tablet Take 10 mg by mouth 3 (three) times daily as needed for muscle spasms.    . Desoximetasone (TOPICORT) 0.25 % ointment Apply 1 application topically 2 (two) times daily as needed (for psoriasis). 30 g 6  . dexlansoprazole (DEXILANT)  60 MG capsule Take 60 mg by mouth daily as needed (for acid reflux).    . diazepam (VALIUM) 5 MG tablet Take 1 tablet (5 mg total) by mouth daily as needed for anxiety or muscle spasms. 30 tablet 0  . docusate sodium (COLACE) 100 MG capsule TAKE 1 CAPSULE (100 MG TOTAL) BY MOUTH DAILY AS NEEDED FOR MILD CONSTIPATION. 30 capsule 0  . escitalopram (LEXAPRO) 20 MG tablet Take 1 tablet (20 mg total) by mouth daily. 90 tablet 3  . fluticasone  (FLONASE) 50 MCG/ACT nasal spray USE 2 SPRAYS INTO EACH NOSTRIL EVERY DAY 16 g 2  . ipratropium (ATROVENT) 0.03 % nasal spray USE 2 SPRAYS INTO THE NOSE EVERY 12 (TWELVE) HOURS. 30 mL 9  . loteprednol (LOTEMAX) 0.5 % ophthalmic suspension Place 1 drop into both eyes 4 (four) times daily.     . meloxicam (MOBIC) 15 MG tablet TAKE 1 TABLET (15 MG TOTAL) BY MOUTH DAILY. 30 tablet 1  . Multiple Vitamin (MULTIVITAMIN) tablet Take 1 tablet by mouth daily.    Marland Kitchen NITROSTAT 0.4 MG SL tablet Place 0.4 mg under the tongue every 5 (five) minutes as needed for chest pain. For chest pain.    Marland Kitchen oxyCODONE-acetaminophen (PERCOCET) 10-325 MG tablet Take 1.5 tablets by mouth 2 (two) times daily. 90 tablet 0  . pravastatin (PRAVACHOL) 40 MG tablet Take 1 tablet (40 mg total) by mouth daily. 90 tablet 3  . Probiotic Product (PROBIOTIC DAILY) CAPS Take 1 capsule by mouth 2 (two) times a week.    . ranitidine (ZANTAC) 150 MG tablet Take 150 mg by mouth as needed for heartburn.     . zolpidem (AMBIEN) 10 MG tablet Take 10 mg by mouth at bedtime as needed for sleep.      No current facility-administered medications for this visit.    Allergies:   Codeine; Propoxyphene n-acetaminophen; Sulfonamide derivatives; Alka-seltzer; Norvasc; Singulair; Iohexol; and Latex   Social History: Social History   Social History  . Marital Status: Divorced    Spouse Name: n/a  . Number of Children: 1  . Years of Education: 17   Occupational History  . Disabled     Fibromyalgia, Depression, Anxiety   Social History Main Topics  . Smoking status: Former Smoker -- 0.00 packs/day for 37 years    Types: Cigarettes    Quit date: 06/05/2012  . Smokeless tobacco: Never Used  . Alcohol Use: No  . Drug Use: No  . Sexual Activity: Not Currently    Birth Control/ Protection: Post-menopausal   Other Topics Concern  . Not on file   Social History Narrative   Lives alone.  Daughter and granddaughter live in [place], [place]. Pt  exercise sometimes.          Family History: Family History  Problem Relation Age of Onset  . Cancer Mother     liver  . Diabetes Mother   . Hyperlipidemia Mother   . Hypertension Mother   . Stroke Mother   . Cancer Maternal Grandmother     breast  . Anesthesia problems Sister     hard to wake up post-op  . Heart disease Father   . Hyperlipidemia Brother   . HIV/AIDS Brother   . Cancer Maternal Grandfather     colon  . Diabetes Brother   . Cancer Brother     lung  . Cancer Paternal Grandfather     COLON     Review of Systems: All other systems reviewed  and are otherwise negative except as noted above.   Physical Exam: VS:  BP 118/60 mmHg  Pulse 70  Ht 5' 7.5" (1.715 m)  Wt 163 lb 6.4 oz (74.118 kg)  BMI 25.20 kg/m2 , BMI Body mass index is 25.2 kg/(m^2).  GEN- The patient is well appearing, alert and oriented x 3 today.   HEENT: normocephalic, atraumatic; sclera clear, conjunctiva pink; hearing intact; oropharynx clear; neck supple  Lungs- Clear to ausculation bilaterally, normal work of breathing.  No wheezes, rales, rhonchi Heart- Regular rate and rhythm  GI- soft, non-tender, non-distended, bowel sounds present  Extremities- no clubbing, cyanosis, or edema; DP/PT/radial pulses 2+ bilaterally MS- no significant deformity or atrophy Skin- warm and dry, no rash or lesion; PPM pocket well healed Psych- euthymic mood, full affect Neuro- strength and sensation are intact  PPM Interrogation- reviewed in detail today,  See PACEART report  EKG:  EKG is not ordered today (recently done by Dr Doylene Canard)  Recent Labs: 06/09/2015: Hemoglobin 12.7; Platelets 361 10/06/2015: ALT 32*; BUN 10; Creat 0.65; Potassium 4.6; Sodium 140   Wt Readings from Last 3 Encounters:  11/13/15 163 lb 6.4 oz (74.118 kg)  10/06/15 155 lb 6.4 oz (70.489 kg)  06/09/15 147 lb 9.6 oz (66.951 kg)     Other studies Reviewed: Additional studies/ records that were reviewed today include: Dr  Jackalyn Lombard office notes  Assessment and Plan:  1.  Sick sinus syndrome Normal PPM function See Claudia Desanctis Art report No changes today She has chronic PPM pocket tenderness. There is no fluctuance over pacemaker site, no redness, tethering, no fevers or chills.  As this has not changed since implant, will follow for now.  2.  HTN Stable No change required today   Current medicines are reviewed at length with the patient today.   The patient does not have concerns regarding her medicines.  The following changes were made today:  none  Labs/ tests ordered today include: none  Orders Placed This Encounter  Procedures  . Implantable device check     Disposition:   Follow up with [facility], Dr Rayann Heman 1 year    Signed, Chanetta Marshall, NP 11/13/2015 2:04 PM  [place] [address] [phone] (office) [phone] (fax)

## 2015-11-17 ENCOUNTER — Ambulatory Visit (INDEPENDENT_AMBULATORY_CARE_PROVIDER_SITE_OTHER): Payer: 59 | Admitting: Psychiatry

## 2015-11-17 DIAGNOSIS — F331 Major depressive disorder, recurrent, moderate: Secondary | ICD-10-CM

## 2015-11-25 ENCOUNTER — Encounter: Payer: Self-pay | Admitting: Physician Assistant

## 2015-11-25 DIAGNOSIS — F5104 Psychophysiologic insomnia: Secondary | ICD-10-CM

## 2015-11-25 DIAGNOSIS — M797 Fibromyalgia: Secondary | ICD-10-CM

## 2015-11-25 DIAGNOSIS — F411 Generalized anxiety disorder: Secondary | ICD-10-CM

## 2015-11-26 MED ORDER — OXYCODONE-ACETAMINOPHEN 10-325 MG PO TABS: 1.5000 | ORAL_TABLET | Freq: Two times a day (BID) | ORAL | Status: DC

## 2015-11-26 MED ORDER — DIAZEPAM 5 MG PO TABS: 5.0000 mg | ORAL_TABLET | Freq: Every day | ORAL | Status: DC | PRN

## 2015-11-26 NOTE — Telephone Encounter (Signed)
Patient notified via My Chart.  Meds ordered this encounter  Medications  . diazepam (VALIUM) 5 MG tablet    Sig: Take 1 tablet (5 mg total) by mouth daily as needed for anxiety or muscle spasms.    Dispense:  30 tablet    Refill:  0    Order Specific Question:  Supervising Provider    Answer:  DOOLITTLE, ROBERT P [[id number]]  . oxyCODONE-acetaminophen (PERCOCET) 10-325 MG tablet    Sig: Take 1.5 tablets by mouth 2 (two) times daily.    Dispense:  90 tablet    Refill:  0    Order Specific Question:  Supervising Provider    Answer:  DOOLITTLE, ROBERT P [[id number]]

## 2015-12-15 ENCOUNTER — Other Ambulatory Visit: Payer: Self-pay | Admitting: Physician Assistant

## 2015-12-15 ENCOUNTER — Ambulatory Visit (INDEPENDENT_AMBULATORY_CARE_PROVIDER_SITE_OTHER): Payer: 59 | Admitting: Psychiatry

## 2015-12-15 DIAGNOSIS — F331 Major depressive disorder, recurrent, moderate: Secondary | ICD-10-CM | POA: Diagnosis not present

## 2015-12-16 NOTE — Telephone Encounter (Signed)
Patient is calling to follow up on the refill request for provastatin. She states she is completely out.

## 2015-12-17 NOTE — Telephone Encounter (Signed)
Sent back message to pharm that they should still have RFs on file for this pt from 06/26/15 Rx. Notified pt on VM.

## 2015-12-28 ENCOUNTER — Telehealth: Payer: Self-pay | Admitting: Physician Assistant

## 2015-12-28 DIAGNOSIS — M797 Fibromyalgia: Secondary | ICD-10-CM

## 2015-12-29 MED ORDER — OXYCODONE-ACETAMINOPHEN 10-325 MG PO TABS: 1.5000 | ORAL_TABLET | Freq: Two times a day (BID) | ORAL | 0 refills | Status: DC

## 2015-12-29 NOTE — Telephone Encounter (Signed)
Meds ordered this encounter  Medications  . oxyCODONE-acetaminophen (PERCOCET) 10-325 MG tablet    Sig: Take 1.5 tablets by mouth 2 (two) times daily.    Dispense:  90 tablet    Refill:  0

## 2015-12-30 NOTE — Telephone Encounter (Signed)
Notified pt by Mychart that Rx is ready.

## 2016-01-01 ENCOUNTER — Telehealth: Payer: Self-pay

## 2016-01-01 MED ORDER — ATORVASTATIN CALCIUM 20 MG PO TABS: 20.0000 mg | ORAL_TABLET | Freq: Every day | ORAL | 3 refills | Status: DC

## 2016-01-01 NOTE — Telephone Encounter (Signed)
Pt is needing to talk with chelle to change the [place] meds to something else it causes the upset stomach

## 2016-01-01 NOTE — Telephone Encounter (Signed)
Spoke with pt, pravastatin is the medication that is making her stomach upset.

## 2016-01-01 NOTE — Telephone Encounter (Signed)
Let's try switching from pravastatin to atorvastatin. It may cause the same symptoms, but most people tolerate another in the same family.  Meds ordered this encounter  Medications  . atorvastatin (LIPITOR) 20 MG tablet    Sig: Take 1 tablet (20 mg total) by mouth daily.    Dispense:  90 tablet    Refill:  3    Order Specific Question:   Supervising Provider    Answer:   Brigitte Pulse, EVA N [[id number]]

## 2016-01-01 NOTE — Telephone Encounter (Signed)
Pt advised.

## 2016-01-05 ENCOUNTER — Encounter: Payer: Self-pay | Admitting: Physician Assistant

## 2016-01-05 ENCOUNTER — Ambulatory Visit (INDEPENDENT_AMBULATORY_CARE_PROVIDER_SITE_OTHER): Payer: Medicare Other | Admitting: Physician Assistant

## 2016-01-05 VITALS — BP 110/64 | HR 68 | Temp 98.4°F | Resp 16 | Ht 67.5 in | Wt 158.6 lb

## 2016-01-05 DIAGNOSIS — L409 Psoriasis, unspecified: Secondary | ICD-10-CM

## 2016-01-05 DIAGNOSIS — I10 Essential (primary) hypertension: Secondary | ICD-10-CM | POA: Diagnosis not present

## 2016-01-05 DIAGNOSIS — E119 Type 2 diabetes mellitus without complications: Secondary | ICD-10-CM

## 2016-01-05 DIAGNOSIS — K760 Fatty (change of) liver, not elsewhere classified: Secondary | ICD-10-CM | POA: Diagnosis not present

## 2016-01-05 DIAGNOSIS — E559 Vitamin D deficiency, unspecified: Secondary | ICD-10-CM

## 2016-01-05 DIAGNOSIS — F419 Anxiety disorder, unspecified: Secondary | ICD-10-CM

## 2016-01-05 DIAGNOSIS — E785 Hyperlipidemia, unspecified: Secondary | ICD-10-CM | POA: Diagnosis not present

## 2016-01-05 DIAGNOSIS — F418 Other specified anxiety disorders: Secondary | ICD-10-CM

## 2016-01-05 DIAGNOSIS — Z23 Encounter for immunization: Secondary | ICD-10-CM

## 2016-01-05 DIAGNOSIS — F32A Depression, unspecified: Secondary | ICD-10-CM

## 2016-01-05 DIAGNOSIS — K5730 Diverticulosis of large intestine without perforation or abscess without bleeding: Secondary | ICD-10-CM

## 2016-01-05 DIAGNOSIS — Z0000 Encounter for general adult medical examination without abnormal findings: Secondary | ICD-10-CM | POA: Diagnosis not present

## 2016-01-05 DIAGNOSIS — M810 Age-related osteoporosis without current pathological fracture: Secondary | ICD-10-CM

## 2016-01-05 DIAGNOSIS — M797 Fibromyalgia: Secondary | ICD-10-CM

## 2016-01-05 DIAGNOSIS — G4700 Insomnia, unspecified: Secondary | ICD-10-CM

## 2016-01-05 DIAGNOSIS — L821 Other seborrheic keratosis: Secondary | ICD-10-CM | POA: Diagnosis not present

## 2016-01-05 DIAGNOSIS — F5104 Psychophysiologic insomnia: Secondary | ICD-10-CM

## 2016-01-05 DIAGNOSIS — F329 Major depressive disorder, single episode, unspecified: Secondary | ICD-10-CM

## 2016-01-05 LAB — CBC WITH DIFFERENTIAL/PLATELET
BASOS ABS: 60 {cells}/uL (ref 0–200)
BASOS PCT: 1 %
EOS PCT: 1 %
Eosinophils Absolute: 60 cells/uL (ref 15–500)
HCT: 42.2 % (ref 35.0–45.0)
HEMOGLOBIN: 13.7 g/dL (ref 11.7–15.5)
LYMPHS ABS: 2220 {cells}/uL (ref 850–3900)
Lymphocytes Relative: 37 %
MCH: 26.6 pg — ABNORMAL LOW (ref 27.0–33.0)
MCHC: 32.5 g/dL (ref 32.0–36.0)
MCV: 81.8 fL (ref 80.0–100.0)
MPV: 9.5 fL (ref 7.5–12.5)
Monocytes Absolute: 480 cells/uL (ref 200–950)
Monocytes Relative: 8 %
NEUTROS ABS: 3180 {cells}/uL (ref 1500–7800)
Neutrophils Relative %: 53 %
PLATELETS: 372 10*3/uL (ref 140–400)
RBC: 5.16 MIL/uL — AB (ref 3.80–5.10)
RDW: 13.2 % (ref 11.0–15.0)
WBC: 6 10*3/uL (ref 3.8–10.8)

## 2016-01-05 LAB — COMPREHENSIVE METABOLIC PANEL
ALBUMIN: 4.7 g/dL (ref 3.6–5.1)
ALT: 31 U/L — ABNORMAL HIGH (ref 6–29)
AST: 21 U/L (ref 10–35)
Alkaline Phosphatase: 58 U/L (ref 33–130)
BILIRUBIN TOTAL: 0.6 mg/dL (ref 0.2–1.2)
BUN: 10 mg/dL (ref 7–25)
CO2: 26 mmol/L (ref 20–31)
CREATININE: 0.79 mg/dL (ref 0.50–0.99)
Calcium: 9.9 mg/dL (ref 8.6–10.4)
Chloride: 104 mmol/L (ref 98–110)
Glucose, Bld: 101 mg/dL — ABNORMAL HIGH (ref 65–99)
Potassium: 4.8 mmol/L (ref 3.5–5.3)
SODIUM: 138 mmol/L (ref 135–146)
TOTAL PROTEIN: 7.9 g/dL (ref 6.1–8.1)

## 2016-01-05 LAB — LIPID PANEL
CHOLESTEROL: 154 mg/dL (ref 125–200)
HDL: 46 mg/dL (ref >=46)
LDL CALC: 96 mg/dL (ref <130)
TRIGLYCERIDES: 59 mg/dL (ref <150)
Total CHOL/HDL Ratio: 3.3 Ratio (ref <=5.0)
VLDL: 12 mg/dL (ref <30)

## 2016-01-05 LAB — TSH: TSH: 1.12 mIU/L

## 2016-01-05 MED ORDER — ZOSTER VACCINE LIVE 19400 UNT/0.65ML ~~LOC~~ SUSR: 0.6500 mL | Freq: Once | SUBCUTANEOUS | 0 refills | Status: AC

## 2016-01-05 NOTE — Progress Notes (Signed)
Presents today for [employer] Visit, Annual Physical Exam, and to address multiple other issues.   Date of last exam: most recent visit 10/06/2015, last wellness exam 11/13/2014  Interpreter used for this visit? no  Patient Care Team: [name], PA-C as PCP - General (Physician Assistant) Dixie Dials, MD as Consulting Physician (Cardiology) Suella Broad, MD as Consulting Physician (Physical Medicine and Rehabilitation) Bo Merino, MD as Consulting Physician (Rheumatology) Johnathan Hausen, MD as Consulting Physician (General Surgery) Orion Crook, MD (Inactive) as Attending Physician (Urology) Dian Situ, MD as Attending Physician (Pain Medicine) Marylynn Pearson, MD as Consulting Physician (Obstetrics and Gynecology)     Other items to address today: Back pain, depression and mole on the LEFT inner ankle.  Back pain is chronic. Seems worse today than usual. She thinks because she is here, on the exam table, and may have overdone activity at home yesterday.   Depression persists. Always present. She sees a therapist, has a strong faith. Minimal interaction with her siblings. Very close with her daughter and granddaughter. Would like to get a dog. When her daughter's dog visits, she feels more happy.  A mole on the inner aspect of the LEFT ankle has been there for years, but looks darker to her. Since it has changed color, she wants it checked.  Her chronic medical problems remain stable, anxiety and depression and fibromyalgia being the most debilitating. She has recently been diagnosed with diabetes and learned a lot from the nutrition and diabetes education classes, and has made significant changes in her eating habits. Unfortunately, her pain prevents her from getting more exercise.  She continues to tolerate her medications well, without adverse effects. She has been free of URI-type symptoms for several months. Her pacemaker site remains tender, but it is  not red, swollen or draining.   Review of Systems  Constitutional: Positive for chills and fever. Negative for malaise/fatigue.  HENT: Positive for congestion. Negative for sore throat.        Post-nasal draiange  Eyes: Positive for photophobia. Negative for blurred vision and pain.  Respiratory: Positive for shortness of breath (some) and wheezing (some). Negative for cough.   Cardiovascular: Positive for chest pain (some). Negative for palpitations and leg swelling.  Gastrointestinal: Positive for abdominal pain (some). Negative for blood in stool, constipation, diarrhea, heartburn, melena, nausea and vomiting.  Genitourinary: Negative for dysuria, frequency, hematuria and urgency.       Some pelvic pain  Musculoskeletal: Positive for back pain (LEFT mid-thoracic, spasm around the scapula), myalgias and neck pain. Negative for joint pain.  Skin: Negative.  Negative for itching and rash.       Nevus inner RIGHT lower leg, seems darker, present x years. Itchy recently.  Neurological: Negative for dizziness, focal weakness, loss of consciousness, weakness and headaches.       Generalized malaise  Endo/Heme/Allergies: Positive for polydipsia. Negative for environmental allergies. Does not bruise/bleed easily.       Heat and cold intolerance  Psychiatric/Behavioral: Positive for depression. Negative for memory loss and suicidal ideas. The patient is nervous/anxious and has insomnia.       Cancer Screening: Cervical: s/p hysterectomy. No history of abnormal pap test. No longer a candidate for cervical cancer screening. Breast: Mammogram 05/2015, due 05/2017. Colon: due this year, sees Dr. Collene Mares in the next week. Prostate: n/a   Other Screening: Last screening for diabetes: patient has diabetes, visits Q3-4 months Last lipid screening: 10/2015. TC 162, TG 85, HDL 41, LDL  104   ADVANCE DIRECTIVES: Discussed: yes On File: no Materials Provided: yes   Immunization status: up to date  and documented.  Home Environment: lives alone. Her daughter and granddaughter live nearby and are very involved. Very little interaction with her siblings, "I come from a very dysfunctional family."    Patient Active Problem List   Diagnosis Date Noted  . Psoriasis 03/10/2015  . Cholecystitis 02/16/2015  . Gallstones 02/15/2015  . Right foot pain 11/13/2014  . Ethmoid sinusitis 11/13/2014  . Abdominal pain, epigastric 11/13/2014  . Vitamin D deficiency 11/13/2014  . 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 Sick sinus syndrome ([place]) 07/08/2014  . Chest pain at rest 07/05/2014  . Chronic rhinitis 10/22/2013  . Fatty liver 10/22/2013  . Interstitial cystitis 10/22/2013  . Hip pain, right 10/22/2013  . Chronic insomnia 09/25/2012  . HTN (hypertension) 09/25/2012  . Hyperlipidemia 09/25/2012  . Diabetes mellitus type 2, controlled, without complications ([place]) 34/74/2595  . GERD 12/04/2008  . History of colonic polyps 12/04/2008  . CONSTIPATION 10/21/2008  . Fibromyalgia 10/21/2008  . LACTOSE INTOLERANCE 11/20/2007  . CHEST PAIN 11/20/2007  . Anxiety and depression 08/02/2007  . Osteoporosis 08/02/2007  . DIVERTICULOSIS, COLON 11/16/2006  . EXTERNAL HEMORRHOIDS 05/12/2005     Past Medical History:  Diagnosis Date  . Allergy   . Anal fissure 02/2012  . Anxiety   . Arthritis   . Asthma   . Bursitis    right hip  . Complication of anesthesia    difficult to wake up post-op  . Depression   . Diabetes mellitus without complication ([place])   . Fibromyalgia   . GERD (gastroesophageal reflux disease)   . Hepatitis A infection 1979  . Hyperlipidemia   . Hypertension   . Irritable bowel syndrome (IBS)   . 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 Tarsal tunnel syndrome of left side   . TMJ (temporomandibular joint syndrome)    left     Past Surgical History:  Procedure Laterality Date  . ABDOMINAL HYSTERECTOMY  09/1994    partial  . APPENDECTOMY    . BILATERAL SALPINGOOPHORECTOMY Bilateral 04/2011  . CARDIAC CATHETERIZATION  10/08/2007  . CESAREAN SECTION    . CHOLECYSTECTOMY N/A 02/16/2015   Procedure: LAPAROSCOPIC CHOLECYSTECTOMY WITH INTRAOPERATIVE CHOLANGIOGRAM;  Surgeon: Johnathan Hausen, MD;  Location: [facility];  Service: General;  Laterality: N/A;  . CYSTOSCOPY W/ URETERAL STENT PLACEMENT  08/04/2009  . EXAMINATION UNDER ANESTHESIA  02/17/2012   Procedure: EXAM UNDER ANESTHESIA;  Surgeon: Pedro Earls, MD;  Location: [facility];  Service: General;  Laterality: N/A;  Exam under anesthesia with lateral internal sphincterotomy  . LAPAROSCOPIC LYSIS INTESTINAL ADHESIONS  08/04/2009  . PERMANENT PACEMAKER INSERTION N/A 07/08/2014   MDT MRI compatible dual chamber pacemaker implanted by Dr Rayann Heman for symptomatic bradycardia  . SHOULDER ARTHROSCOPY W/ ROTATOR CUFF REPAIR Left 1999  . SMALL INTESTINE SURGERY    . SPHINCTEROTOMY  02/17/2012   Procedure: SPHINCTEROTOMY;  Surgeon: Pedro Earls, MD;  Location: [facility];  Service: General;  Laterality: N/A;     Family History  Problem Relation Age of Onset  . Cancer Mother     liver  . Diabetes Mother   . Hyperlipidemia Mother   . Hypertension Mother   . Stroke Mother   . Cancer Maternal Grandmother     breast  . Anesthesia problems Sister     hard  to wake up post-op  . Heart disease Father   . Hyperlipidemia Brother   . HIV/AIDS Brother   . Cancer Maternal Grandfather     colon  . Diabetes Brother   . Cancer Brother     lung  . Cancer Paternal Grandfather     COLON     Social History   Social History  . Marital status: Divorced    Spouse name: n/a  . Number of children: 1  . Years of education: 77   Occupational History  . Disabled Retired    Fibromyalgia, Depression, Anxiety   Social History Main Topics  . Smoking status: Former Smoker    Packs/day: 0.00    Years: 37.00    Types: Cigarettes    Quit  date: 06/05/2012  . Smokeless tobacco: Never Used  . Alcohol use No  . Drug use: No  . Sexual activity: Not Currently    Birth control/ protection: Post-menopausal   Other Topics Concern  . Not on file   Social History Narrative   Lives alone.  Daughter and granddaughter live in [place], [place]. Pt exercise sometimes.           Allergies  Allergen Reactions  . Codeine Other (See Comments)    HALLUCINATIONS   . Propoxyphene N-Acetaminophen Other (See Comments)    HALLUCINATIONS  . Sulfonamide Derivatives Hives, Itching and Swelling  . Alka-Seltzer [Aspirin Effervescent] Other (See Comments)    "like she's fading away"  . Norvasc [Amlodipine Besylate]   . Singulair [Montelukast Sodium] Other (See Comments)    Increased depression, sadness  . Iohexol Nausea Only    JITTERY    . Latex Hives and Itching     Prior to Admission medications   Medication Sig Start Date End Date Taking? Authorizing Provider  albuterol (PROVENTIL HFA;VENTOLIN HFA) 108 (90 BASE) MCG/ACT inhaler Inhale 2 puffs into the lungs every 6 (six) hours as needed for wheezing. 08/15/13  Yes Linzey Ramser, PA-C  atorvastatin (LIPITOR) 20 MG tablet Take 1 tablet (20 mg total) by mouth daily. 01/01/16  Yes Anayi Bricco, PA-C  Azilsartan Medoxomil (EDARBI) 40 MG TABS Take 20 mg by mouth every morning.   Yes Historical Provider, MD  blood glucose meter kit and supplies KIT Dispense based on patient and insurance preference. Use daily as directed. ICD-10: E11.9 06/09/15  Yes Jonathon Tan, PA-C  cholecalciferol (VITAMIN D) 1000 units tablet Take 1 tablet (1,000 Units total) by mouth daily. 10/06/15  Yes Kaytlan Behrman, PA-C  cyclobenzaprine (FLEXERIL) 10 MG tablet Take 10 mg by mouth 3 (three) times daily as needed for muscle spasms.   Yes Historical Provider, MD  Desoximetasone (TOPICORT) 0.25 % ointment Apply 1 application topically 2 (two) times daily as needed (for psoriasis). 11/13/14  Yes Mcgregor Tinnon, PA-C   dexlansoprazole (DEXILANT) 60 MG capsule Take 60 mg by mouth daily as needed (for acid reflux).   Yes Historical Provider, MD  diazepam (VALIUM) 5 MG tablet Take 1 tablet (5 mg total) by mouth daily as needed for anxiety or muscle spasms. 11/26/15  Yes Torrell Krutz, PA-C  docusate sodium (COLACE) 100 MG capsule TAKE 1 CAPSULE (100 MG TOTAL) BY MOUTH DAILY AS NEEDED FOR MILD CONSTIPATION. 10/23/14  Yes Denean Pavon, PA-C  escitalopram (LEXAPRO) 20 MG tablet Take 1 tablet (20 mg total) by mouth daily. 10/06/15  Yes Shaindy Reader, PA-C  fluticasone (FLONASE) 50 MCG/ACT nasal spray USE 2 SPRAYS INTO EACH NOSTRIL EVERY DAY 08/20/15  Yes Mancel Bale,  PA-C  ipratropium (ATROVENT) 0.03 % nasal spray USE 2 SPRAYS INTO THE NOSE EVERY 12 (TWELVE) HOURS. 03/10/15  Yes Dandra Velardi, PA-C  loteprednol (LOTEMAX) 0.5 % ophthalmic suspension Place 1 drop into both eyes 4 (four) times daily.    Yes Historical Provider, MD  meloxicam (MOBIC) 15 MG tablet TAKE 1 TABLET (15 MG TOTAL) BY MOUTH DAILY. 12/29/14  Yes Mikaylee Arseneau, PA-C  Multiple Vitamin (MULTIVITAMIN) tablet Take 1 tablet by mouth daily.   Yes Historical Provider, MD  NITROSTAT 0.4 MG SL tablet Place 0.4 mg under the tongue every 5 (five) minutes as needed for chest pain. For chest pain. 11/24/11  Yes Historical Provider, MD  oxyCODONE-acetaminophen (PERCOCET) 10-325 MG tablet Take 1.5 tablets by mouth 2 (two) times daily. 12/29/15  Yes Kaiyden Simkin, PA-C  Probiotic Product (PROBIOTIC DAILY) CAPS Take 1 capsule by mouth 2 (two) times a week.   Yes Historical Provider, MD  ranitidine (ZANTAC) 150 MG tablet Take 150 mg by mouth as needed for heartburn.    Yes Historical Provider, MD  zolpidem (AMBIEN) 10 MG tablet Take 10 mg by mouth at bedtime as needed for sleep.  09/28/13  Yes Historical Provider, MD        Fall Risk  01/05/2016 01/05/2016 10/06/2015 06/09/2015 11/13/2014  Falls in the past year? No No No No No     Functional Status Survey:        Amb Nursing Assessment - 01/05/16 1005      Pre-visit preparation   Pre-visit preparation completed No     Functional Status   Activities of Daily Living Independent   Ambulation Independent   Medication Administration Independent   Home Management Independent     Risk/Barriers  Assessment   Barriers to Care Management & Learning Emotional barrier  depression often impedes patient's ability to self advocate   Psychosocial Barriers Lack of motivation     Abuse/Neglect Assessment   Do you feel unsafe in your current relationship? --  not in current relationship   Do you feel physically threatened by others? No  no, because I have a shotgun and a pistol   Anyone hurting you at home, work, or school? No     Patient Literacy   How often do you need to have someone help you when you read instructions, pamphlets, or other written materials from your doctor or pharmacy? 1 - Never     Investment banker, operational Needed? No       Depression screen [facility] 2/9 01/05/2016 10/06/2015 06/09/2015 03/10/2015 11/13/2014  Decreased Interest 2 3 0 0 2  Down, Depressed, Hopeless 2 2 0 0 2  PHQ - 2 Score 4 5 0 0 4  Altered sleeping 3 3 - - 0  Tired, decreased energy 3 3 - - 3  Change in appetite 3 3 - - 1  Feeling bad or failure about yourself  2 3 - - 2  Trouble concentrating 0 2 - - 0  Moving slowly or fidgety/restless 1 1 - - 0  Suicidal thoughts 0 2 - - 0  PHQ-9 Score 16 22 - - 10  Difficult doing work/chores Somewhat difficult Somewhat difficult - - -  Some recent data might be hidden    Evaluation of Cognitive Function: Mood/affect: depressed and anxious mood/flat affect. This is her baseline. Appearance: well-groomed. Family Member/caregiver input: none. Patient is not accompanied at this visit.     PHYSICAL EXAM: BP 110/64 (BP Location: Right Arm, Patient Position:  Sitting, Cuff Size: Normal)   Pulse 68   Temp 98.4 F (36.9 C) (Oral)   Resp 16   Ht 5' 7.5" (1.715 m)   Wt  158 lb 9.6 oz (71.9 kg)   SpO2 98%   BMI 24.47 kg/m    Wt Readings from Last 3 Encounters:  01/05/16 158 lb 9.6 oz (71.9 kg)  11/13/15 163 lb 6.4 oz (74.1 kg)  10/06/15 155 lb 6.4 oz (70.5 kg)       Visual Acuity Screening   Right eye Left eye Both eyes  Without correction: 20/20 20/15 20/15   With correction:         Physical Exam  Constitutional: She is oriented to person, place, and time. She appears well-developed and well-nourished. She is active and cooperative. No distress.  HENT:  Head: Normocephalic and atraumatic.  Right Ear: Hearing, tympanic membrane, external ear and ear canal normal.  Left Ear: Hearing, tympanic membrane, external ear and ear canal normal.  Nose: Nose normal.  Mouth/Throat: Uvula is midline, oropharynx is clear and moist and mucous membranes are normal. No oral lesions. Normal dentition. No uvula swelling.  Eyes: Conjunctivae, EOM and lids are normal. Pupils are equal, round, and reactive to light.  Neck: Normal range of motion, full passive range of motion without pain and phonation normal. Neck supple. No thyroid mass and no thyromegaly present.  Cardiovascular: Normal rate, regular rhythm, normal heart sounds and normal pulses.   Respiratory: Effort normal and breath sounds normal. She exhibits tenderness (diffuse, at baseline, worst at the pacemaker site).  GI: Normal appearance and bowel sounds are normal. She exhibits no distension and no mass. There is no hepatosplenomegaly. There is generalized tenderness (diffuse, unchanged from baseline).  Musculoskeletal:  Generalized tenderness, unchanged from baseline. FROM of the shoulders, elbows, wrists, hands, neck, back, hips, knees, ankles.  Lymphadenopathy:       Head (right side): No submandibular and no tonsillar adenopathy present.       Head (left side): No submandibular and no tonsillar adenopathy present.    She has no cervical adenopathy.       Right: No inguinal, no supraclavicular and  no epitrochlear adenopathy present.       Left: No inguinal, no supraclavicular and no epitrochlear adenopathy present.  Neurological: She is alert and oriented to person, place, and time. She has normal strength. No cranial nerve deficit or sensory deficit.  Skin: Skin is warm, dry and intact. Lesion (mild psoriatic lesions on the feet; multiple hyperkeratotic, hyperpigmented "stuck on" appearing plaques, consistent with SK on the back and lower leg) noted. No rash noted. She is not diaphoretic. No cyanosis. Nails show no clubbing.  Psychiatric: Her speech is normal and behavior is normal. Judgment normal. Her mood appears anxious. Her affect is not angry, not blunt, not labile and not inappropriate. Thought content is paranoid. Thought content is not delusional. Cognition and memory are normal. She exhibits a depressed mood. She expresses no homicidal and no suicidal ideation. She expresses no suicidal plans and no homicidal plans.     Education/Counseling: yes diet and exercise yes prevention of chronic diseases n/a smoking/tobacco cessation yes review "Covered Medicare Preventive Services"    ASSESSMENT/PLAN: 1. Medicare annual wellness visit, subsequent Reviewed advanced directives, counseled on creating them. Information provided.  2. Annual physical exam Age appropriate anticipatory guidance provided.  3. Anxiety and depression Stable. Continue current treatment and counseling.  4. Chronic insomnia Stable. Continue current treatment.  5. Fibromyalgia Stable. Continue current  treatment.  6. Seborrheic keratosis Reviewed the typically benign nature of these lesions. Monitor for changes in size, color. Can be removed if they develop concerning features or she decides she wants them removed, but she is likely to develop more.  7. Essential hypertension Controlled. Continue current treatment. - CBC with Differential/Platelet - Comprehensive metabolic panel - TSH  8. Fatty  liver This has been stable. Continue statin therapy. - Comprehensive metabolic panel  9. Controlled type 2 diabetes mellitus without complication, without long-term current use of insulin ([place]) Controlled with lifestyle changes. Continue efforts..  10. Osteoporosis Vitamin D and calcium. Update osteoporosis 05/2017 as recommended. Consider bisphosphonate, though may exacerbate GERD.  11. Psoriasis Much improved. Continue follow-up with podiatry.  12. Hyperlipidemia Has been stable. Continue statin therapy. - Lipid panel  13. Vitamin D deficiency Given osteoporosis, needs to maintain adequate vitamin D. Update level to determine supplement dose. - VITAMIN D 25 Hydroxy (Vit-D Deficiency, Fractures)  14. Need for shingles vaccine - Zoster Vaccine Live, PF, (ZOSTAVAX) 24268 UNT/0.65ML injection; Inject 19,400 Units into the skin once.  Dispense: 1 each; Refill: 0  15. Diverticulosis of large intestine without hemorrhage Stable. Follow-up with Dr. Collene Mares as planned to update colonoscopy.   Fara Chute, PA-C Physician Assistant-Certified [facility]

## 2016-01-05 NOTE — Patient Instructions (Addendum)
IF you received an x-ray today, you will receive an invoice from [facility]. Please contact [facility] at [phone] with questions or concerns regarding your invoice.   IF you received labwork today, you will receive an invoice from [employer]. Please contact [facility] at [phone] with questions or concerns regarding your invoice.   Our billing staff will not be able to assist you with questions regarding bills from these companies.  You will be contacted with the lab results as soon as they are available. The fastest way to get your results is to activate your My Chart account. Instructions are located on the last page of this paperwork. If you have not heard from [place] regarding the results in 2 weeks, please contact this office.    Keeping You Healthy  Get These Tests  Blood Pressure- Have your blood pressure checked by your healthcare provider at least once a year.  Normal blood pressure is 120/80.  Weight- Have your body mass index (BMI) calculated to screen for obesity.  BMI is a measure of body fat based on height and weight.  You can calculate your own BMI at [employer].it  Cholesterol- Have your cholesterol checked every year.  Diabetes- Have your blood sugar checked every year if you have high blood pressure, high cholesterol, a family history of diabetes or if you are overweight.  Pap Test - Have a pap test every 1 to 5 years if you have been sexually active.  If you are older than 65 and recent pap tests have been normal you may not need additional pap tests.  In addition, if you have had a hysterectomy  for benign disease additional pap tests are not necessary.  Mammogram-Yearly mammograms are essential for early detection of breast cancer  Screening for Colon Cancer- Colonoscopy starting at age 83. Screening may begin sooner depending on your family history and other health conditions.  Follow up colonoscopy  as directed by your Gastroenterologist.  Screening for Osteoporosis- Screening begins at age 100 with bone density scanning, sooner if you are at higher risk for developing Osteoporosis.  Get these medicines  Calcium with Vitamin D- Your body requires 1200-1500 mg of Calcium a day and [phone] IU of Vitamin D a day.  You can only absorb 500 mg of Calcium at a time therefore Calcium must be taken in 2 or 3 separate doses throughout the day.  Hormones- Hormone therapy has been associated with increased risk for certain cancers and heart disease.  Talk to your healthcare provider about if you need relief from menopausal symptoms.  Aspirin- Ask your healthcare provider about taking Aspirin to prevent Heart Disease and Stroke.  Get these Immuniztions  Flu shot- Every fall  Pneumonia shot- Once after the age of 53; if you are younger ask your healthcare provider if you need a pneumonia shot.  Tetanus- Every ten years.  Zostavax- Once after the age of 64 to prevent shingles.  Take these steps  Don't smoke- Your healthcare provider can help you quit. For tips on how to quit, ask your healthcare provider or go to www.smokefree.gov or call 1-800 QUIT-NOW.  Be physically active- Exercise 5 days a week for a minimum of 30 minutes.  If you are not already physically active, start slow and gradually work up to 30 minutes of moderate physical activity.  Try walking, dancing, bike riding, swimming, etc.  Eat a healthy diet- Eat a variety of healthy foods such as fruits, vegetables, whole  grains, low fat milk, low fat cheeses, yogurt, lean meats, chicken, fish, eggs, dried beans, tofu, etc.  For more information go to www.thenutritionsource.org  Dental visit- Brush and floss teeth twice daily; visit your dentist twice a year.  Eye exam- Visit your Optometrist or Ophthalmologist yearly.  Drink alcohol in moderation- Limit alcohol intake to one drink or less a day.  Never drink and  drive.  Depression- Your emotional health is as important as your physical health.  If you're feeling down or losing interest in things you normally enjoy, please talk to your healthcare provider.  Seat Belts- can save your life; always wear one  Smoke/Carbon Monoxide detectors- These detectors need to be installed on the appropriate level of your home.  Replace batteries at least once a year.  Violence- If anyone is threatening or hurting you, please tell your healthcare provider.  Living Will/ Health care power of attorney- Discuss with your healthcare provider and family.

## 2016-01-06 LAB — VITAMIN D 25 HYDROXY (VIT D DEFICIENCY, FRACTURES): VIT D 25 HYDROXY: 43 ng/mL (ref 30–100)

## 2016-01-11 ENCOUNTER — Telehealth: Payer: Self-pay | Admitting: Family Medicine

## 2016-01-11 NOTE — Telephone Encounter (Signed)
Pt called stating that [name] put her on Lipitor and she cant take it because it makes her have muscle pain she also cant take Crestor that was given to her in the past please send it to [facility] on [address] and call pt at [phone]

## 2016-01-13 ENCOUNTER — Encounter: Payer: Self-pay | Admitting: Physician Assistant

## 2016-01-19 ENCOUNTER — Ambulatory Visit (INDEPENDENT_AMBULATORY_CARE_PROVIDER_SITE_OTHER): Payer: 59 | Admitting: Psychiatry

## 2016-01-19 DIAGNOSIS — F331 Major depressive disorder, recurrent, moderate: Secondary | ICD-10-CM

## 2016-01-25 ENCOUNTER — Encounter: Payer: Self-pay | Admitting: Physician Assistant

## 2016-01-25 DIAGNOSIS — M797 Fibromyalgia: Secondary | ICD-10-CM

## 2016-01-26 MED ORDER — OXYCODONE-ACETAMINOPHEN 10-325 MG PO TABS: 1.5000 | ORAL_TABLET | Freq: Two times a day (BID) | ORAL | 0 refills | Status: DC

## 2016-01-26 NOTE — Telephone Encounter (Signed)
Patient notified via My Chart.  Meds ordered this encounter  Medications  . oxyCODONE-acetaminophen (PERCOCET) 10-325 MG tablet    Sig: Take 1.5 tablets by mouth 2 (two) times daily.    Dispense:  90 tablet    Refill:  0    Order Specific Question:   Supervising Provider    Answer:   Brigitte Pulse, EVA N [[id number]]

## 2016-02-09 ENCOUNTER — Emergency Department (HOSPITAL_COMMUNITY)
Admission: EM | Admit: 2016-02-09 | Discharge: 2016-02-09 | Disposition: A | Discharge status: Home / Self Care | Payer: Medicare Other | Attending: Emergency Medicine | Admitting: Emergency Medicine

## 2016-02-09 ENCOUNTER — Emergency Department (HOSPITAL_COMMUNITY): Payer: Medicare Other

## 2016-02-09 DIAGNOSIS — S8012XA Contusion of left lower leg, initial encounter: Secondary | ICD-10-CM | POA: Insufficient documentation

## 2016-02-09 DIAGNOSIS — E119 Type 2 diabetes mellitus without complications: Secondary | ICD-10-CM | POA: Insufficient documentation

## 2016-02-09 DIAGNOSIS — Y939 Activity, unspecified: Secondary | ICD-10-CM | POA: Insufficient documentation

## 2016-02-09 DIAGNOSIS — Y92512 Supermarket, store or market as the place of occurrence of the external cause: Secondary | ICD-10-CM | POA: Insufficient documentation

## 2016-02-09 DIAGNOSIS — W01198A Fall on same level from slipping, tripping and stumbling with subsequent striking against other object, initial encounter: Secondary | ICD-10-CM | POA: Diagnosis not present

## 2016-02-09 DIAGNOSIS — J45909 Unspecified asthma, uncomplicated: Secondary | ICD-10-CM | POA: Diagnosis not present

## 2016-02-09 DIAGNOSIS — Z79899 Other long term (current) drug therapy: Secondary | ICD-10-CM | POA: Insufficient documentation

## 2016-02-09 DIAGNOSIS — S8001XA Contusion of right knee, initial encounter: Secondary | ICD-10-CM | POA: Insufficient documentation

## 2016-02-09 DIAGNOSIS — Y999 Unspecified external cause status: Secondary | ICD-10-CM | POA: Insufficient documentation

## 2016-02-09 DIAGNOSIS — S99921A Unspecified injury of right foot, initial encounter: Secondary | ICD-10-CM | POA: Diagnosis present

## 2016-02-09 DIAGNOSIS — Z87891 Personal history of nicotine dependence: Secondary | ICD-10-CM | POA: Insufficient documentation

## 2016-02-09 DIAGNOSIS — Z91040 Latex allergy status: Secondary | ICD-10-CM | POA: Diagnosis not present

## 2016-02-09 DIAGNOSIS — Z791 Long term (current) use of non-steroidal anti-inflammatories (NSAID): Secondary | ICD-10-CM | POA: Insufficient documentation

## 2016-02-09 DIAGNOSIS — W19XXXA Unspecified fall, initial encounter: Secondary | ICD-10-CM

## 2016-02-09 DIAGNOSIS — S93601A Unspecified sprain of right foot, initial encounter: Secondary | ICD-10-CM | POA: Diagnosis not present

## 2016-02-09 DIAGNOSIS — I10 Essential (primary) hypertension: Secondary | ICD-10-CM | POA: Diagnosis not present

## 2016-02-09 NOTE — ED Provider Notes (Signed)
[name]   CSN: [id number] Arrival date & time: 02/09/16  1650  By signing my name below, I, Emmanuella Mensah, attest that this documentation has been prepared under the direction and in the presence of Rakel Junio, PA-C. Electronically Signed: Judithann Sauger, ED Scribe. 02/09/16. 6:19 PM.    History   Chief Complaint Chief Complaint  Patient presents with  . Fall    HPI Comments: [name] is a 60 y.o. female brought in by ambulance, with a hx of arthritis, fibromyalgia, and DM who presents to the Emergency Department complaining of gradually worsening moderate BLE pain s/p fall that occurred at 3:30 pm today. She notes that the pain is worse on the dorsum of her right foot, LLE, and right knee. She reports associated bruising to her right anterior knee and bruising and abrasion to the left shin. Pt explains that she was ambulating in the grocery store while wearing flip flops when she slipped and fell on a grape, landing on her right knee and her left foot went under the cart. She denies any LOC or head injury. No alleviating factors noted. Pt has not tried any medications PTA. She denies any BUE pain, fever, nausea, vomiting, generalized rash, or any open wounds.   The history is provided by the patient. No language interpreter was used.    Past Medical History:  Diagnosis Date  . Allergy   . Anal fissure 02/2012  . Anxiety   . Arthritis   . Asthma   . Bursitis    right hip  . Complication of anesthesia    difficult to wake up post-op  . Depression   . Diabetes mellitus without complication ([place])   . Fibromyalgia   . GERD (gastroesophageal reflux disease)   . Hepatitis A infection 1979  . Hyperlipidemia   . Hypertension   . Irritable bowel syndrome (IBS)   . 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 Tarsal tunnel syndrome of left side   . TMJ (temporomandibular joint syndrome)    left    Patient Active Problem List   Diagnosis Date Noted  . Psoriasis 03/10/2015  . Cholecystitis 02/16/2015  . Gallstones 02/15/2015  . Right foot pain 11/13/2014  . Ethmoid sinusitis 11/13/2014  . Abdominal pain, epigastric 11/13/2014  . Vitamin D deficiency 11/13/2014  . 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 Sick sinus syndrome ([place]) 07/08/2014  . Chest pain at rest 07/05/2014  . Chronic rhinitis 10/22/2013  . Fatty liver 10/22/2013  . Interstitial cystitis 10/22/2013  . Hip pain, right 10/22/2013  . Chronic insomnia 09/25/2012  . HTN (hypertension) 09/25/2012  . Hyperlipidemia 09/25/2012  . Diabetes mellitus type 2, controlled, without complications ([place]) 33/82/5053  . GERD 12/04/2008  . History of colonic polyps 12/04/2008  . CONSTIPATION 10/21/2008  . Fibromyalgia 10/21/2008  . LACTOSE INTOLERANCE 11/20/2007  . CHEST PAIN 11/20/2007  . Anxiety and depression 08/02/2007  . Osteoporosis 08/02/2007  . Diverticulosis of large intestine 11/16/2006  . EXTERNAL HEMORRHOIDS 05/12/2005    Past Surgical History:  Procedure Laterality Date  . ABDOMINAL HYSTERECTOMY  09/1994   partial  . APPENDECTOMY    . BILATERAL SALPINGOOPHORECTOMY Bilateral 04/2011  . CARDIAC CATHETERIZATION  10/08/2007  . CESAREAN SECTION    . CHOLECYSTECTOMY N/A 02/16/2015   Procedure: LAPAROSCOPIC CHOLECYSTECTOMY WITH INTRAOPERATIVE CHOLANGIOGRAM;  Surgeon: Johnathan Hausen, MD;  Location: [facility];  Service: General;  Laterality: N/A;  . CYSTOSCOPY W/ URETERAL  STENT PLACEMENT  08/04/2009  . EXAMINATION UNDER ANESTHESIA  02/17/2012   Procedure: EXAM UNDER ANESTHESIA;  Surgeon: Pedro Earls, MD;  Location: [facility];  Service: General;  Laterality: N/A;  Exam under anesthesia with lateral internal sphincterotomy  . LAPAROSCOPIC LYSIS INTESTINAL ADHESIONS  08/04/2009  . PERMANENT PACEMAKER INSERTION N/A 07/08/2014   MDT MRI compatible dual chamber pacemaker implanted by Dr Rayann Heman for symptomatic  bradycardia  . SHOULDER ARTHROSCOPY W/ ROTATOR CUFF REPAIR Left 1999  . SMALL INTESTINE SURGERY    . SPHINCTEROTOMY  02/17/2012   Procedure: SPHINCTEROTOMY;  Surgeon: Pedro Earls, MD;  Location: [facility];  Service: General;  Laterality: N/A;    OB History    No data available       Home Medications    Prior to Admission medications   Medication Sig Start Date End Date Taking? Authorizing Provider  albuterol (PROVENTIL HFA;VENTOLIN HFA) 108 (90 BASE) MCG/ACT inhaler Inhale 2 puffs into the lungs every 6 (six) hours as needed for wheezing. 08/15/13   [name] Jeffery, PA-C  atorvastatin (LIPITOR) 20 MG tablet Take 1 tablet (20 mg total) by mouth daily. 01/01/16   [name] Jeffery, PA-C  Azilsartan Medoxomil (EDARBI) 40 MG TABS Take 20 mg by mouth every morning.    Historical Provider, MD  blood glucose meter kit and supplies KIT Dispense based on patient and insurance preference. Use daily as directed. ICD-10: E11.9 06/09/15   [name], PA-C  cholecalciferol (VITAMIN D) 1000 units tablet Take 1 tablet (1,000 Units total) by mouth daily. 10/06/15   [name] Jeffery, PA-C  cyclobenzaprine (FLEXERIL) 10 MG tablet Take 10 mg by mouth 3 (three) times daily as needed for muscle spasms.    Historical Provider, MD  Desoximetasone (TOPICORT) 0.25 % ointment Apply 1 application topically 2 (two) times daily as needed (for psoriasis). 11/13/14   [name] Jeffery, PA-C  dexlansoprazole (DEXILANT) 60 MG capsule Take 60 mg by mouth daily as needed (for acid reflux).    Historical Provider, MD  diazepam (VALIUM) 5 MG tablet Take 1 tablet (5 mg total) by mouth daily as needed for anxiety or muscle spasms. 11/26/15   [name] Jeffery, PA-C  docusate sodium (COLACE) 100 MG capsule TAKE 1 CAPSULE (100 MG TOTAL) BY MOUTH DAILY AS NEEDED FOR MILD CONSTIPATION. 10/23/14   [name] Jeffery, PA-C  escitalopram (LEXAPRO) 20 MG tablet Take 1 tablet (20 mg total) by mouth daily. 10/06/15   [name] Jeffery, PA-C    fluticasone (FLONASE) 50 MCG/ACT nasal spray USE 2 SPRAYS INTO EACH NOSTRIL EVERY DAY 08/20/15   Mancel Bale, PA-C  ipratropium (ATROVENT) 0.03 % nasal spray USE 2 SPRAYS INTO THE NOSE EVERY 12 (TWELVE) HOURS. 03/10/15   [name] Jeffery, PA-C  loteprednol (LOTEMAX) 0.5 % ophthalmic suspension Place 1 drop into both eyes 4 (four) times daily.     Historical Provider, MD  meloxicam (MOBIC) 15 MG tablet TAKE 1 TABLET (15 MG TOTAL) BY MOUTH DAILY. 12/29/14   [name] Jeffery, PA-C  Multiple Vitamin (MULTIVITAMIN) tablet Take 1 tablet by mouth daily.    Historical Provider, MD  NITROSTAT 0.4 MG SL tablet Place 0.4 mg under the tongue every 5 (five) minutes as needed for chest pain. For chest pain. 11/24/11   Historical Provider, MD  oxyCODONE-acetaminophen (PERCOCET) 10-325 MG tablet Take 1.5 tablets by mouth 2 (two) times daily. 01/26/16   [name] Jeffery, PA-C  Probiotic Product (PROBIOTIC DAILY) CAPS Take 1 capsule by mouth 2 (two) times a week.  Historical Provider, MD  ranitidine (ZANTAC) 150 MG tablet Take 150 mg by mouth as needed for heartburn.     Historical Provider, MD  zolpidem (AMBIEN) 10 MG tablet Take 10 mg by mouth at bedtime as needed for sleep.  09/28/13   Historical Provider, MD    Family History Family History  Problem Relation Age of Onset  . Cancer Mother     liver  . Diabetes Mother   . Hyperlipidemia Mother   . Hypertension Mother   . Stroke Mother   . Cancer Maternal Grandmother     breast  . Anesthesia problems Sister     hard to wake up post-op  . Heart disease Father   . Hyperlipidemia Brother   . HIV/AIDS Brother   . Cancer Maternal Grandfather     colon  . Diabetes Brother   . Cancer Brother     lung  . Cancer Paternal Grandfather     COLON    Social History Social History  Substance Use Topics  . Smoking status: Former Smoker    Packs/day: 0.00    Years: 37.00    Types: Cigarettes    Quit date: 06/05/2012  . Smokeless tobacco: Never Used  . Alcohol  use No     Allergies   Codeine; Propoxyphene n-acetaminophen; Sulfonamide derivatives; Alka-seltzer [aspirin effervescent]; Norvasc [amlodipine besylate]; Red dye; Singulair [montelukast sodium]; Iohexol; and Latex   Review of Systems Review of Systems  Constitutional: Negative for fever.  Gastrointestinal: Negative for nausea and vomiting.  Musculoskeletal: Positive for arthralgias.  Skin: Positive for wound. Negative for rash.  Neurological: Negative for dizziness, light-headedness and headaches.     Physical Exam Updated Vital Signs BP 125/71   Pulse 66   Temp 98.1 F (36.7 C) (Oral)   Resp 18   SpO2 97%   Physical Exam  Constitutional: She is oriented to person, place, and time. She appears well-developed and well-nourished.  HENT:  Head: Normocephalic and atraumatic.  Neck: Normal range of motion.  Cardiovascular: Normal rate.   Pulmonary/Chest: Effort normal.  Musculoskeletal:  No obvious swelling or deformity noted to the right knee, right ankle, right foot, left tib-fib except for a small, 1 cm, abrasion to the right anterior knee and tiny, less than 1 cm abrasion to the left shin.right knee tender over anterior joint and patella. No tenderness over medial or lateral joint or posterior knee. Pain with any range of motion of the knee. Joint is stable with negative anterior-posterior drawer signs. No laxity of medial or lateral stress. Normal ankle exam, tenderness to palpation over dorsal and plantar surfaces of the first metatarsal and first MTP joints. Range of motion of the great toe at MTP joint. Tenderness to palpation over anterior left shin, from just distal to the knee, to just proximal to the left ankle. Normal range of motion and no pain with movement of the left knee and left ankle. No midline cervical, thoracic, lumbar spine tenderness  Neurological: She is alert and oriented to person, place, and time.  Skin: Skin is warm and dry.  Psychiatric: She has a  normal mood and affect.  Nursing note and vitals reviewed.    ED Treatments / Results  DIAGNOSTIC STUDIES: Oxygen Saturation is 98% on RA, normal by my interpretation.   COORDINATION OF CARE: 6:17 PM- Pt advised of plan for treatment and pt agrees. She will receive right knee, right foot, and left tibia/fibula x-ray for further evaluation.    Labs (all labs  ordered are listed, but only abnormal results are displayed) Labs Reviewed - No data to display  EKG  EKG Interpretation None       Radiology Dg Tibia/fibula Left  Result Date: 02/09/2016 CLINICAL DATA:  Fall.  Pain EXAM: LEFT TIBIA AND FIBULA - 2 VIEW COMPARISON:  None. FINDINGS: There is no evidence of fracture or other focal bone lesions. Soft tissues are unremarkable. IMPRESSION: Negative. Electronically Signed   By: Franchot Gallo M.D.   On: 02/09/2016 19:19   Dg Knee Complete 4 Views Right  Result Date: 02/09/2016 CLINICAL DATA:  slipped and fell on a grape in the grocery store earlier today. States she landed on her R knee and hit her L shin and the top of her R foot. States generalized pain in all three areas EXAM: RIGHT KNEE - COMPLETE 4+ VIEW COMPARISON:  None. FINDINGS: Marginal spurring in the medial compartment and along the tibial spine. Mild marginal spurring in the patella. No knee effusion identified. Minimal soft tissue swelling anterior to the patella. IMPRESSION: 1. Mild soft tissue swelling anterior to the patella. No knee effusion. 2. Mild medial compartmental and patellar spurring. Electronically Signed   By: Van Clines M.D.   On: 02/09/2016 19:15   Dg Foot Complete Right  Result Date: 02/09/2016 CLINICAL DATA:  Fall.  Pain EXAM: RIGHT FOOT COMPLETE - 3+ VIEW COMPARISON:  None. FINDINGS: There is no evidence of fracture or dislocation. There is no evidence of arthropathy or other focal bone abnormality. Soft tissues are unremarkable. IMPRESSION: Negative. Electronically Signed   By: Franchot Gallo M.D.    On: 02/09/2016 19:19    Procedures Procedures (including critical care time)  Medications Ordered in ED Medications - No data to display   Initial Impression / Assessment and Plan / ED Course  Jeannett Senior, PA-C has reviewed the triage vital signs and the nursing notes.  Pertinent labs & imaging results that were available during my care of the patient were reviewed by me and considered in my medical decision making (see chart for details).  Clinical Course    Patient is here after mechanical fall after slipping on a grape at a grocery store. She has history of chronic pain and takes oxycodone 10 mg every 12hours daily. She is here with injury to the right knee, right foot, left shin. She did not hit her head on loss consciousness. No evidence of head trauma. X-rays of the knee, foot, tib-fib obtained and are negative. Plan to discharge home with close outpatient follow-up with her doctor. She'll continue her medications at home. Return precautions discussed.  Vitals:   02/09/16 1701 02/09/16 1941  BP: 107/77 125/71  Pulse: 65 66  Resp: 18 18  Temp: 98.1 F (36.7 C)   TempSrc: Oral   SpO2: 98% 97%     Final Clinical Impressions(s) / ED Diagnoses   Final diagnoses:  Fall, initial encounter  Contusion of right knee, initial encounter  Right foot sprain, initial encounter  Contusion of left lower leg, initial encounter    New Prescriptions New Prescriptions   No medications on file   I personally performed the services described in this documentation, which was scribed in my presence. The recorded information has been reviewed and is accurate.    Jeannett Senior, PA-C 02/09/16 1946    Quintella Reichert, MD 02/11/16 1135

## 2016-02-09 NOTE — Discharge Instructions (Signed)
Ice and elevate your legs. Rest. Continue your regular pain medications. Follow up with primary care doctor.

## 2016-02-09 NOTE — ED Triage Notes (Signed)
Pt BIB [facility] after she slipped and fell on a grape in the grocery store. States she landed on her R knee and hit her L shin and the top of her R foot. Denies head injury or LOC. Alert and oriented. Passed spinal clearance with EMS.

## 2016-02-11 ENCOUNTER — Ambulatory Visit (INDEPENDENT_AMBULATORY_CARE_PROVIDER_SITE_OTHER): Payer: Medicare Other | Admitting: Physician Assistant

## 2016-02-11 ENCOUNTER — Ambulatory Visit (INDEPENDENT_AMBULATORY_CARE_PROVIDER_SITE_OTHER): Payer: Medicare Other

## 2016-02-11 VITALS — BP 110/80 | HR 83 | Temp 98.1°F

## 2016-02-11 DIAGNOSIS — M25551 Pain in right hip: Secondary | ICD-10-CM

## 2016-02-11 DIAGNOSIS — M79671 Pain in right foot: Secondary | ICD-10-CM | POA: Diagnosis not present

## 2016-02-11 DIAGNOSIS — Z9181 History of falling: Secondary | ICD-10-CM

## 2016-02-11 DIAGNOSIS — M25561 Pain in right knee: Secondary | ICD-10-CM

## 2016-02-11 NOTE — Patient Instructions (Addendum)
Please ice the affected areas 4-5 times a day for 30 minutes at a time.  Rest areas until pain and swelling has stopped.  Make sure to continue to use a walking assistance device while you are having trouble walking. Return to clinic if symptoms worsen, do not improve, or as needed  After the swelling has gone done and the pain has lessened, begin trying to move around to avoid getting too stiff. You can do foot and knee stretches to help avoid tightness.     IF you received an x-ray today, you will receive an invoice from [facility]. Please contact [facility] at [phone] with questions or concerns regarding your invoice.   IF you received labwork today, you will receive an invoice from [employer]. Please contact [facility] at [phone] with questions or concerns regarding your invoice.   Our billing staff will not be able to assist you with questions regarding bills from these companies.  You will be contacted with the lab results as soon as they are available. The fastest way to get your results is to activate your My Chart account. Instructions are located on the last page of this paperwork. If you have not heard from [place] regarding the results in 2 weeks, please contact this office.

## 2016-02-11 NOTE — Progress Notes (Signed)
[name]  MRN: [id number] DOB: 1955/10/22  Subjective:  [name] is a 60 y.o. female seen in office today for a chief complaint of follow up on fall. Pt fell on 02/09/16 while in the grocery store. She slipped on a grape while pushing her cart and fell on her right side. She hit directly on her right knee and then fell onto the right hip. Her left left scraped the cart which caused an abrasion. Pt notes she is very fragile and screamed really loud as she hit the ground. She did not hit her head and she did not have LOC. The ambulance was then called to take her to the ER. During ER visit, she received xrays of left tibia/fibula, right foot, and right knee; all of which were negative. She was then discharged home with aftercare instructions. Pt notes that later that night she was very stressed from the fall and had some chest pain. It lasted about 15 minutes and was relieved with valium. She had no associated nausea, diaphoresis, or SOB.    Today pt complains of right hip pain. She would like an xray of her hip as she mentions that the ER did not perform one. Since the fall, she has tried percocet and muscle relaxer with mild relief. She has iced it minimally since the fall. Has associated worsening of pian with ambulation. Denies numbness, tingling, and loss of sensation.   Of note, patient has history chronic pain and fibromyalgia but she is having worsening pain in the affected areas today.    Review of Systems  Cardiovascular: Negative for chest pain.  Neurological: Negative for dizziness and light-headedness.    Patient Active Problem List   Diagnosis Date Noted  . Psoriasis 03/10/2015  . Cholecystitis 02/16/2015  . Gallstones 02/15/2015  . Right foot pain 11/13/2014  . Ethmoid sinusitis 11/13/2014  . Abdominal pain, epigastric 11/13/2014  . Vitamin D deficiency 11/13/2014  . 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 Sick sinus syndrome ([place]) 07/08/2014  . Chest pain at rest 07/05/2014    . Chronic rhinitis 10/22/2013  . Fatty liver 10/22/2013  . Interstitial cystitis 10/22/2013  . Hip pain, right 10/22/2013  . Chronic insomnia 09/25/2012  . HTN (hypertension) 09/25/2012  . Hyperlipidemia 09/25/2012  . Diabetes mellitus type 2, controlled, without complications ([place]) 40/01/6760  . GERD 12/04/2008  . History of colonic polyps 12/04/2008  . CONSTIPATION 10/21/2008  . Fibromyalgia 10/21/2008  . LACTOSE INTOLERANCE 11/20/2007  . CHEST PAIN 11/20/2007  . Anxiety and depression 08/02/2007  . Osteoporosis 08/02/2007  . Diverticulosis of large intestine 11/16/2006  . EXTERNAL HEMORRHOIDS 05/12/2005    Current Outpatient Prescriptions on File Prior to Visit  Medication Sig Dispense Refill  . albuterol (PROVENTIL HFA;VENTOLIN HFA) 108 (90 BASE) MCG/ACT inhaler Inhale 2 puffs into the lungs every 6 (six) hours as needed for wheezing. 18 g 2  . Azilsartan Medoxomil (EDARBI) 40 MG TABS Take 20 mg by mouth every morning.    . blood glucose meter kit and supplies KIT Dispense based on patient and insurance preference. Use daily as directed. ICD-10: E11.9 1 each 0  . cholecalciferol (VITAMIN D) 1000 units tablet Take 1 tablet (1,000 Units total) by mouth daily. 90 tablet 3  . cyclobenzaprine (FLEXERIL) 10 MG tablet Take 10 mg by mouth 3 (three) times daily as needed for muscle spasms.    . Desoximetasone (TOPICORT) 0.25 % ointment Apply 1 application topically 2 (two) times daily as needed (for psoriasis). [place]  g 6  . dexlansoprazole (DEXILANT) 60 MG capsule Take 60 mg by mouth daily as needed (for acid reflux).    . diazepam (VALIUM) 5 MG tablet Take 1 tablet (5 mg total) by mouth daily as needed for anxiety or muscle spasms. 30 tablet 0  . docusate sodium (COLACE) 100 MG capsule TAKE 1 CAPSULE (100 MG TOTAL) BY MOUTH DAILY AS NEEDED FOR MILD CONSTIPATION. 30 capsule 0  . escitalopram (LEXAPRO) 20 MG tablet Take 1 tablet (20 mg total) by mouth daily. 90 tablet 3  . fluticasone  (FLONASE) 50 MCG/ACT nasal spray USE 2 SPRAYS INTO EACH NOSTRIL EVERY DAY 16 g 2  . ipratropium (ATROVENT) 0.03 % nasal spray USE 2 SPRAYS INTO THE NOSE EVERY 12 (TWELVE) HOURS. 30 mL 9  . loteprednol (LOTEMAX) 0.5 % ophthalmic suspension Place 1 drop into both eyes 4 (four) times daily.     . meloxicam (MOBIC) 15 MG tablet TAKE 1 TABLET (15 MG TOTAL) BY MOUTH DAILY. 30 tablet 1  . Multiple Vitamin (MULTIVITAMIN) tablet Take 1 tablet by mouth daily.    Marland Kitchen NITROSTAT 0.4 MG SL tablet Place 0.4 mg under the tongue every 5 (five) minutes as needed for chest pain. For chest pain.    Marland Kitchen oxyCODONE-acetaminophen (PERCOCET) 10-325 MG tablet Take 1.5 tablets by mouth 2 (two) times daily. 90 tablet 0  . Probiotic Product (PROBIOTIC DAILY) CAPS Take 1 capsule by mouth 2 (two) times a week.    . ranitidine (ZANTAC) 150 MG tablet Take 150 mg by mouth as needed for heartburn.     . zolpidem (AMBIEN) 10 MG tablet Take 10 mg by mouth at bedtime as needed for sleep.     Marland Kitchen atorvastatin (LIPITOR) 20 MG tablet Take 1 tablet (20 mg total) by mouth daily. (Patient not taking: Reported on 02/11/2016) 90 tablet 3   No current facility-administered medications on file prior to visit.     Allergies  Allergen Reactions  . Codeine Other (See Comments)    HALLUCINATIONS   . Propoxyphene N-Acetaminophen Other (See Comments)    HALLUCINATIONS  . Sulfonamide Derivatives Hives, Itching and Swelling  . Alka-Seltzer [Aspirin Effervescent] Other (See Comments)    "like she's fading away"  . Norvasc [Amlodipine Besylate]   . Red Dye   . Singulair [Montelukast Sodium] Other (See Comments)    Increased depression, sadness  . Iohexol Nausea Only    JITTERY    . Latex Hives and Itching   Social History   Social History  . Marital status: Divorced    Spouse name: n/a  . Number of children: 1  . Years of education: 15   Occupational History  . Disabled Retired    Fibromyalgia, Depression, Anxiety   Social History Main  Topics  . Smoking status: Former Smoker    Packs/day: 0.00    Years: 37.00    Types: Cigarettes    Quit date: 06/05/2012  . Smokeless tobacco: Never Used  . Alcohol use No  . Drug use: No  . Sexual activity: Not Currently    Birth control/ protection: Post-menopausal   Other Topics Concern  . Not on file   Social History Narrative   Lives alone.  Daughter and granddaughter live in [place], [place]. Pt exercise sometimes.          Objective:  BP 110/80   Pulse 83   Temp 98.1 F (36.7 C) (Oral)   Physical Exam  Constitutional: She is oriented to person, place, and time  and well-developed, well-nourished, and in no distress.  HENT:  Head: Normocephalic and atraumatic.  Eyes: Conjunctivae are normal.  Neck: Normal range of motion.  Pulmonary/Chest: Effort normal.  Musculoskeletal:       Right hip: She exhibits tenderness (to palpalpation and with ROM and ambulation). She exhibits normal strength.       Right knee: She exhibits swelling ( minimal overlying patella) and ecchymosis (overlying patella). She exhibits normal range of motion. Tenderness (palpation of patella) found.       Left knee: Normal.       Right lower leg: Normal.       Legs:      Right foot: There is tenderness ( with palpation of dorum of foot, with palpatoin of right hallux, with flexion and extension of right hallux) and swelling (minimal overlying dorsum of foot). There is normal capillary refill.       Left foot: Normal.  Neurological: She is alert and oriented to person, place, and time.  Gait affected due to pain in affected areas. Using a cane for ambulation.   Skin: Skin is warm and dry.  Psychiatric: Affect normal.  Vitals reviewed.   Dg Hip Unilat W Or W/o Pelvis 2-3 Views Right  Result Date: 02/11/2016 CLINICAL DATA:  Pt here for follow up of fall. Pt fell on 02/09/16 while in the grocery store. She slipped on a grape while pushing her cart and fell on her right side. She hit directly on her  right knee and then fell onto the right hip. EXAM: DG HIP (WITH OR WITHOUT PELVIS) 2-3V RIGHT COMPARISON:  None. FINDINGS: There is no evidence of hip fracture or dislocation. There is no evidence of arthropathy or other focal bone abnormality. IMPRESSION: Negative. Electronically Signed   By: Lajean Manes M.D.   On: 02/11/2016 10:05   Fall Risk  02/11/2016 01/05/2016 01/05/2016 10/06/2015 06/09/2015  Falls in the past year? Yes No No No No  Number falls in past yr: 1 - - - -  Injury with Fall? Yes - - - -     Assessment and Plan :  1. History of recent fall 2. Hip pain, right - DG HIP UNILAT W OR W/O PELVIS 2-3 VIEWS RIGHT; Future 3. Right foot pain - Post-op shoe 4. Right knee pain  -Start applying ice to affected areas 4-5 x day for 30 minutes at a time -Continue using walking assistance device until pain susides -Return to clinic if symptoms worsen, do not improve, or as needed   Tenna Delaine PA-C  [facility] 02/11/2016 10:08 AM

## 2016-02-12 ENCOUNTER — Ambulatory Visit (INDEPENDENT_AMBULATORY_CARE_PROVIDER_SITE_OTHER): Payer: Medicare Other | Admitting: *Deleted

## 2016-02-12 DIAGNOSIS — I495 Sick sinus syndrome: Secondary | ICD-10-CM

## 2016-02-12 NOTE — Progress Notes (Signed)
Remote pacemaker transmission.   

## 2016-02-15 ENCOUNTER — Encounter: Payer: Self-pay | Admitting: Cardiology

## 2016-02-15 ENCOUNTER — Telehealth: Payer: Self-pay

## 2016-02-15 DIAGNOSIS — M25551 Pain in right hip: Secondary | ICD-10-CM

## 2016-02-15 NOTE — Telephone Encounter (Signed)
Pt requesting therapy for recently treated injury    [facility] phone [phone]

## 2016-02-16 ENCOUNTER — Encounter: Payer: Self-pay | Admitting: Physician Assistant

## 2016-02-16 ENCOUNTER — Ambulatory Visit: Payer: 59 | Admitting: Psychiatry

## 2016-02-16 DIAGNOSIS — M25551 Pain in right hip: Secondary | ICD-10-CM

## 2016-02-16 DIAGNOSIS — M25561 Pain in right knee: Secondary | ICD-10-CM

## 2016-02-16 DIAGNOSIS — M79604 Pain in right leg: Secondary | ICD-10-CM

## 2016-02-16 NOTE — Telephone Encounter (Signed)
Ok to refer.

## 2016-02-17 NOTE — Telephone Encounter (Signed)
What kind of therapy is patient wanting? If it is physical therapy, then yes we can definitely do that. Please let me know if I need to put in the referral or if you can. Thanks!

## 2016-02-18 NOTE — Telephone Encounter (Signed)
Referral to PT, as per patient's request. She asks that we call her, rather than My Chart message, in this case as she does not check her email every day.

## 2016-02-19 NOTE — Telephone Encounter (Signed)
Called patient back. She stated that she wanted physical therapy for both her right hip and knee. She is still having trouble with both.

## 2016-02-22 LAB — CUP PACEART REMOTE DEVICE CHECK
Brady Statistic AP VP Percent: 0.07 %
Brady Statistic AS VP Percent: 0 %
Brady Statistic AS VS Percent: 0.41 %
Brady Statistic RV Percent Paced: 0.07 %
Date Time Interrogation Session: 20170908120452
Implantable Lead Implant Date: 20160202
Implantable Lead Model: 5076
Lead Channel Impedance Value: 551 Ohm
Lead Channel Sensing Intrinsic Amplitude: 23.5 mV
Lead Channel Sensing Intrinsic Amplitude: 23.5 mV
Lead Channel Sensing Intrinsic Amplitude: 3.875 mV
Lead Channel Setting Pacing Amplitude: 2.5 V
Lead Channel Setting Sensing Sensitivity: 2.8 mV
MDC IDC LEAD IMPLANT DT: 20160202
MDC IDC LEAD LOCATION: 753859
MDC IDC LEAD LOCATION: 753860
MDC IDC MSMT BATTERY REMAINING LONGEVITY: 97 mo
MDC IDC MSMT BATTERY VOLTAGE: 3.01 V
MDC IDC MSMT LEADCHNL RA IMPEDANCE VALUE: 399 Ohm
MDC IDC MSMT LEADCHNL RA IMPEDANCE VALUE: 475 Ohm
MDC IDC MSMT LEADCHNL RA PACING THRESHOLD AMPLITUDE: 0.625 V
MDC IDC MSMT LEADCHNL RA PACING THRESHOLD PULSEWIDTH: 0.4 ms
MDC IDC MSMT LEADCHNL RA SENSING INTR AMPL: 3.875 mV
MDC IDC MSMT LEADCHNL RV IMPEDANCE VALUE: 456 Ohm
MDC IDC MSMT LEADCHNL RV PACING THRESHOLD AMPLITUDE: 0.875 V
MDC IDC MSMT LEADCHNL RV PACING THRESHOLD PULSEWIDTH: 0.4 ms
MDC IDC SET LEADCHNL RA PACING AMPLITUDE: 2 V
MDC IDC SET LEADCHNL RV PACING PULSEWIDTH: 0.4 ms
MDC IDC STAT BRADY AP VS PERCENT: 99.52 %
MDC IDC STAT BRADY RA PERCENT PACED: 99.59 %

## 2016-02-23 ENCOUNTER — Other Ambulatory Visit: Payer: Self-pay | Admitting: Physician Assistant

## 2016-02-23 NOTE — Telephone Encounter (Signed)
Physical therapy referral given.

## 2016-02-24 NOTE — Telephone Encounter (Signed)
[name], do you want to RF?

## 2016-02-24 NOTE — Telephone Encounter (Signed)
Meds ordered this encounter  Medications  . meloxicam (MOBIC) 15 MG tablet    Sig: TAKE 1 TABLET BY MOUTH EVERY DAY    Dispense:  30 tablet    Refill:  3

## 2016-02-28 ENCOUNTER — Encounter: Payer: Self-pay | Admitting: Physician Assistant

## 2016-02-28 DIAGNOSIS — M797 Fibromyalgia: Secondary | ICD-10-CM

## 2016-02-29 MED ORDER — OXYCODONE-ACETAMINOPHEN 10-325 MG PO TABS: 1.5000 | ORAL_TABLET | Freq: Two times a day (BID) | ORAL | 0 refills | Status: DC

## 2016-02-29 NOTE — Telephone Encounter (Signed)
Patient notified via My Chart.  Meds ordered this encounter  Medications  . oxyCODONE-acetaminophen (PERCOCET) 10-325 MG tablet    Sig: Take 1.5 tablets by mouth 2 (two) times daily.    Dispense:  90 tablet    Refill:  0

## 2016-03-03 ENCOUNTER — Ambulatory Visit (INDEPENDENT_AMBULATORY_CARE_PROVIDER_SITE_OTHER): Payer: 59 | Admitting: Psychiatry

## 2016-03-03 DIAGNOSIS — F331 Major depressive disorder, recurrent, moderate: Secondary | ICD-10-CM

## 2016-03-26 IMAGING — CR DG CHEST 2V
2 series · 2 of 2 positions shown · non-contrast
Comparison: DG CHEST 1V PORT dated 12/27/2012

CLINICAL DATA: Cough shortness of breath left chest pain former
smoker

EXAM:
CHEST  2 VIEW

[w chest pa]
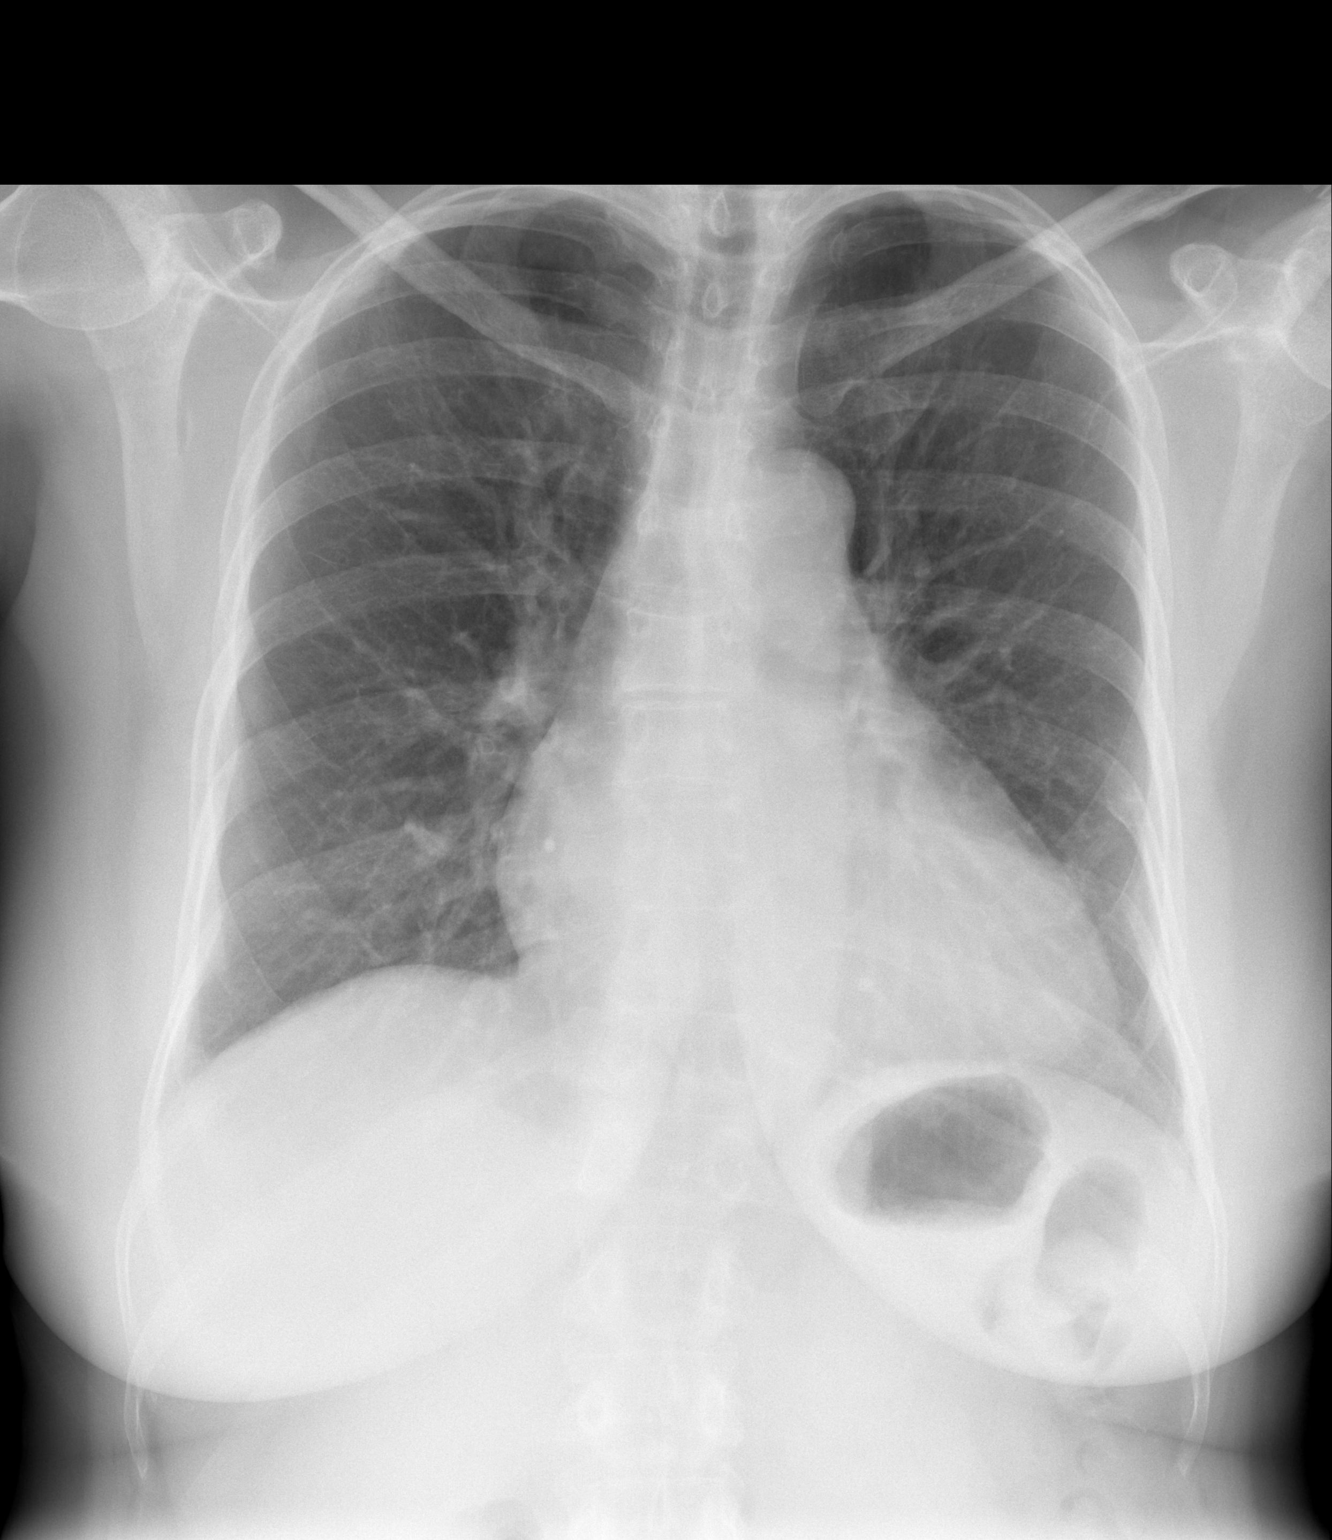

[w chest lat]
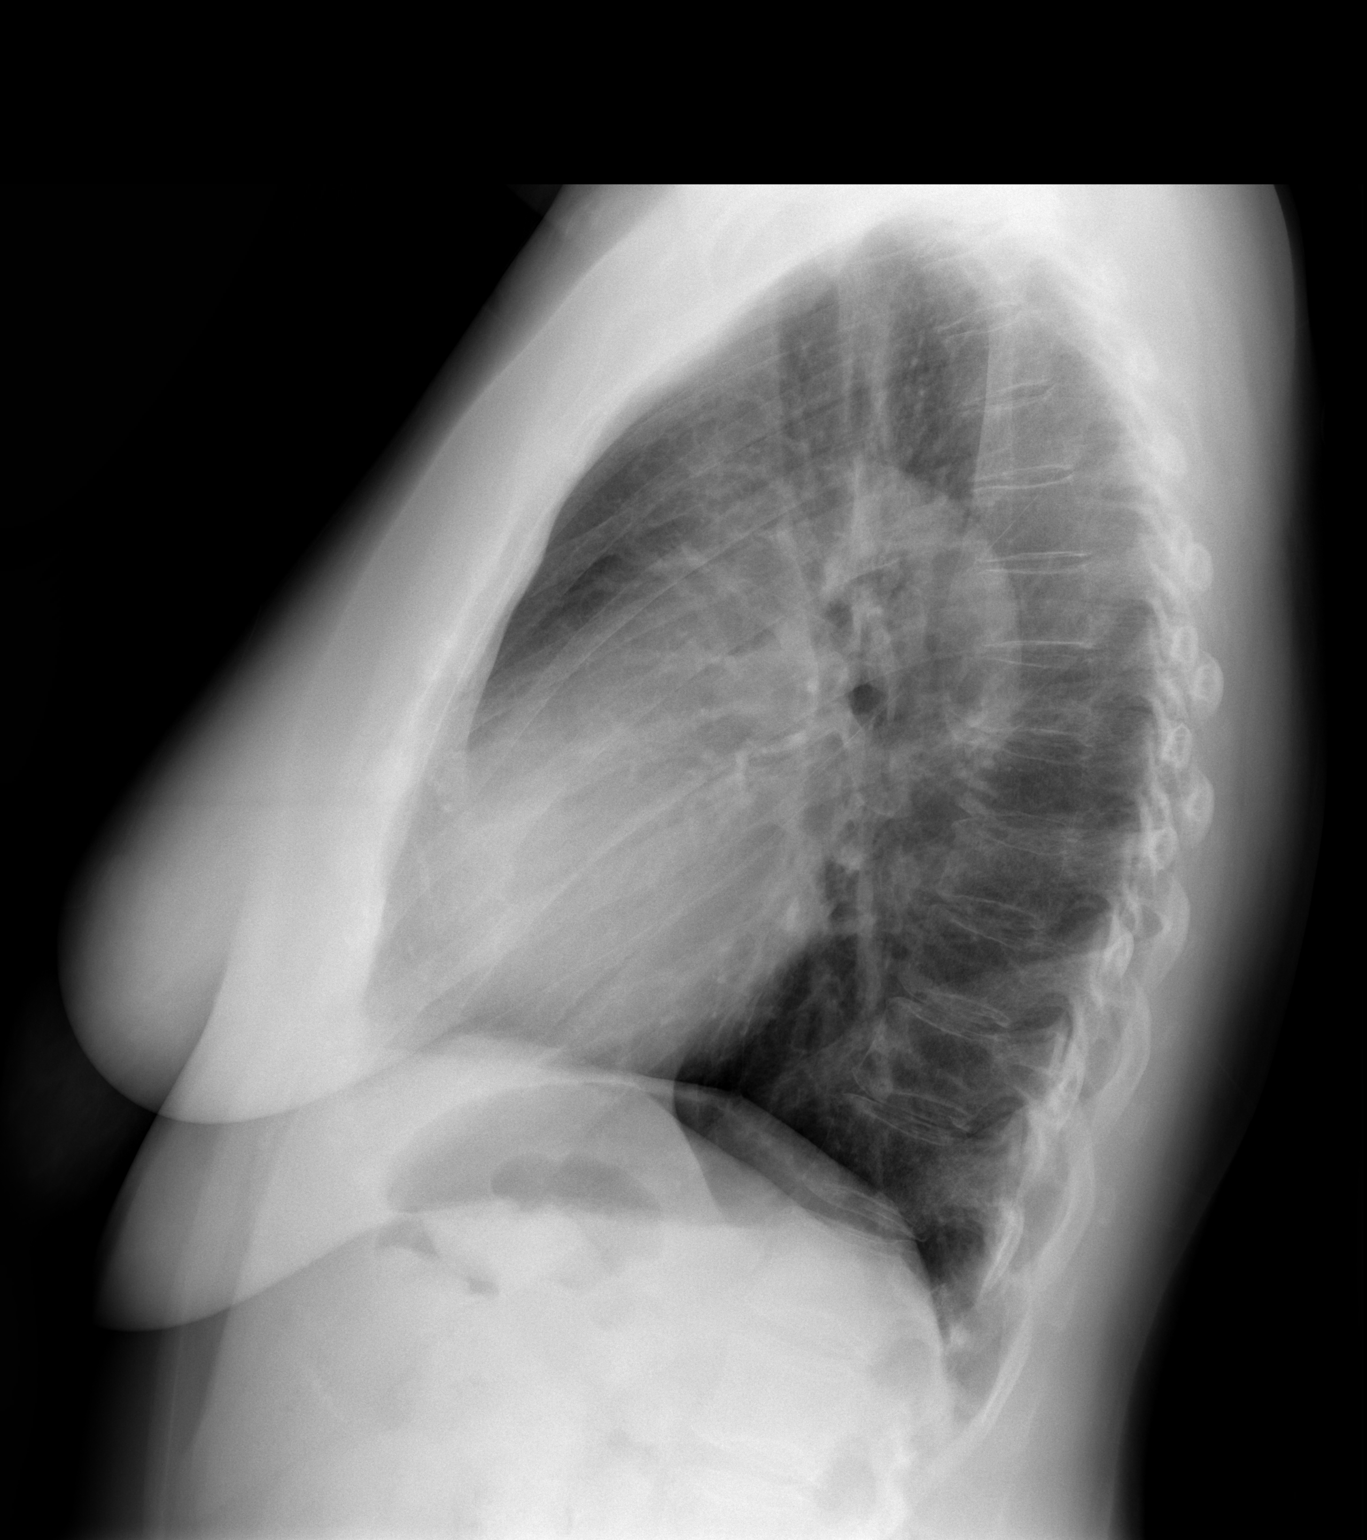

[2 of 2 positions shown; findings below may reference images not displayed]

FINDINGS: Mild cardiac enlargement. Vascular pattern normal. No consolidation
effusion or pneumothorax.
IMPRESSION: No acute findings.

These results will be called to the ordering clinician or
representative by the Radiologist Assistant, and communication
documented in the PACS or zVision Dashboard.

## 2016-03-29 ENCOUNTER — Encounter: Payer: Self-pay | Admitting: Physician Assistant

## 2016-03-29 DIAGNOSIS — M797 Fibromyalgia: Secondary | ICD-10-CM

## 2016-03-30 MED ORDER — OXYCODONE-ACETAMINOPHEN 10-325 MG PO TABS: 1.5000 | ORAL_TABLET | Freq: Two times a day (BID) | ORAL | 0 refills | Status: DC

## 2016-03-30 NOTE — Telephone Encounter (Signed)
Voice mail left for pt to pick up prescription

## 2016-03-30 NOTE — Telephone Encounter (Signed)
Patient notified via My Chart.  Meds ordered this encounter  Medications  . oxyCODONE-acetaminophen (PERCOCET) 10-325 MG tablet    Sig: Take 1.5 tablets by mouth 2 (two) times daily.    Dispense:  90 tablet    Refill:  0

## 2016-03-30 NOTE — Telephone Encounter (Signed)
Last OV with you 8/1, last RF 9/25. Pended.

## 2016-03-31 ENCOUNTER — Ambulatory Visit (INDEPENDENT_AMBULATORY_CARE_PROVIDER_SITE_OTHER): Payer: 59 | Admitting: Psychiatry

## 2016-03-31 DIAGNOSIS — F331 Major depressive disorder, recurrent, moderate: Secondary | ICD-10-CM | POA: Diagnosis not present

## 2016-04-07 ENCOUNTER — Encounter: Payer: Self-pay | Admitting: Physician Assistant

## 2016-04-12 ENCOUNTER — Encounter: Payer: Self-pay | Admitting: Physician Assistant

## 2016-04-12 ENCOUNTER — Ambulatory Visit (INDEPENDENT_AMBULATORY_CARE_PROVIDER_SITE_OTHER): Payer: Medicare Other | Admitting: Physician Assistant

## 2016-04-12 VITALS — BP 126/86 | HR 84 | Temp 98.1°F | Resp 16 | Ht 67.0 in | Wt 160.4 lb

## 2016-04-12 DIAGNOSIS — M797 Fibromyalgia: Secondary | ICD-10-CM

## 2016-04-12 DIAGNOSIS — E119 Type 2 diabetes mellitus without complications: Secondary | ICD-10-CM | POA: Diagnosis not present

## 2016-04-12 DIAGNOSIS — F32A Depression, unspecified: Secondary | ICD-10-CM

## 2016-04-12 DIAGNOSIS — F418 Other specified anxiety disorders: Secondary | ICD-10-CM | POA: Diagnosis not present

## 2016-04-12 DIAGNOSIS — F329 Major depressive disorder, single episode, unspecified: Secondary | ICD-10-CM

## 2016-04-12 DIAGNOSIS — Z23 Encounter for immunization: Secondary | ICD-10-CM

## 2016-04-12 DIAGNOSIS — F5104 Psychophysiologic insomnia: Secondary | ICD-10-CM

## 2016-04-12 DIAGNOSIS — K5730 Diverticulosis of large intestine without perforation or abscess without bleeding: Secondary | ICD-10-CM | POA: Diagnosis not present

## 2016-04-12 DIAGNOSIS — M25551 Pain in right hip: Secondary | ICD-10-CM

## 2016-04-12 DIAGNOSIS — E785 Hyperlipidemia, unspecified: Secondary | ICD-10-CM

## 2016-04-12 DIAGNOSIS — Z86010 Personal history of colonic polyps: Secondary | ICD-10-CM | POA: Diagnosis not present

## 2016-04-12 DIAGNOSIS — R197 Diarrhea, unspecified: Secondary | ICD-10-CM

## 2016-04-12 DIAGNOSIS — F419 Anxiety disorder, unspecified: Secondary | ICD-10-CM

## 2016-04-12 LAB — CBC WITH DIFFERENTIAL/PLATELET
BASOS ABS: 0 {cells}/uL (ref 0–200)
Basophils Relative: 0 %
EOS PCT: 1 %
Eosinophils Absolute: 53 cells/uL (ref 15–500)
HCT: 37.1 % (ref 35.0–45.0)
HEMOGLOBIN: 12.4 g/dL (ref 11.7–15.5)
LYMPHS ABS: 2332 {cells}/uL (ref 850–3900)
Lymphocytes Relative: 44 %
MCH: 27.2 pg (ref 27.0–33.0)
MCHC: 33.4 g/dL (ref 32.0–36.0)
MCV: 81.4 fL (ref 80.0–100.0)
MPV: 9.6 fL (ref 7.5–12.5)
Monocytes Absolute: 424 cells/uL (ref 200–950)
Monocytes Relative: 8 %
NEUTROS ABS: 2491 {cells}/uL (ref 1500–7800)
Neutrophils Relative %: 47 %
Platelets: 322 10*3/uL (ref 140–400)
RBC: 4.56 MIL/uL (ref 3.80–5.10)
RDW: 13.8 % (ref 11.0–15.0)
WBC: 5.3 10*3/uL (ref 3.8–10.8)

## 2016-04-12 LAB — COMPREHENSIVE METABOLIC PANEL
ALK PHOS: 49 U/L (ref 33–130)
ALT: 36 U/L — AB (ref 6–29)
AST: 23 U/L (ref 10–35)
Albumin: 4.2 g/dL (ref 3.6–5.1)
BUN: 8 mg/dL (ref 7–25)
CALCIUM: 9.3 mg/dL (ref 8.6–10.4)
CO2: 25 mmol/L (ref 20–31)
Chloride: 106 mmol/L (ref 98–110)
Creat: 0.6 mg/dL (ref 0.50–0.99)
GLUCOSE: 98 mg/dL (ref 65–99)
POTASSIUM: 4.2 mmol/L (ref 3.5–5.3)
Sodium: 140 mmol/L (ref 135–146)
Total Bilirubin: 0.6 mg/dL (ref 0.2–1.2)
Total Protein: 7 g/dL (ref 6.1–8.1)

## 2016-04-12 LAB — LIPID PANEL
CHOLESTEROL: 187 mg/dL (ref <200)
HDL: 41 mg/dL — AB (ref >50)
LDL Cholesterol: 130 mg/dL — ABNORMAL HIGH
TRIGLYCERIDES: 78 mg/dL (ref <150)
Total CHOL/HDL Ratio: 4.6 Ratio (ref <5.0)
VLDL: 16 mg/dL (ref <30)

## 2016-04-12 LAB — HEMOGLOBIN A1C
HEMOGLOBIN A1C: 5.7 % — AB (ref <5.7)
MEAN PLASMA GLUCOSE: 117 mg/dL

## 2016-04-12 NOTE — Progress Notes (Signed)
Patient ID: [name], female    DOB: 1955-07-22, 60 y.o.   MRN: [id number]  PCP: [name], PA-C  Subjective:   Chief Complaint  Patient presents with  . Diabetes    Flu shot    HPI Presents for evaluation of Diabetes, HTN and hyperlipidemia. Desires influenza vaccine.  Diabetes has been controlled since diagnosis. Does not do home monitoring. Notes thick "syrupy" urine several times each week, but no other urinary symptoms.  Abdominal pain, diarrhea,x 3 months. Relates that her gastroenterologist won't do a colonoscopy because she has a pacemaker, and recommended that the patient see her colleague, who will. She needs a referral, apparently, to see the other physician. She uses Lomotil and Activia with minimal benefit. She stopped the statin, thinking it was causing her symptoms, but hasn't had improvement.   Fibromyalgia continues to be a problem. She relates that it has "progressed to my eyes," and for that her eye specialist has prescribed Lotemax. Continues to use meloxicam and cyclobenzaprine a couple of times/week for pain, now primarily in the hip. Last bursa injection was 03/26/2014. She has received injections in the feet in the interim.  Anxiety continues to be problematic. USes diazepam twice a month and zolpidem up to 3 times/month. Feels that depression is well controlled by escitalopram. She believes that the montelukast contributes to depression, so if she notices it worsening, she takes a break from it.    Review of Systems Constitutional: Positive for appetite change. Negative for chills, fever and unexpected weight change.  HENT: Positive for congestion. Negative for ear discharge, ear pain, postnasal drip, rhinorrhea, sinus pain, sinus pressure, tinnitus and trouble swallowing.   Eyes: Positive for visual disturbance. Negative for photophobia, pain and redness.  Respiratory: Positive for cough and wheezing. Negative for chest tightness and shortness of  breath.   Cardiovascular: Positive for chest pain. Negative for palpitations and leg swelling.  Gastrointestinal: Positive for abdominal distention, abdominal pain and diarrhea. Negative for blood in stool, constipation, nausea and vomiting.  Endocrine: Positive for polyphagia. Negative for polydipsia and polyuria.  Genitourinary: Negative for difficulty urinating, dysuria, frequency, hematuria and urgency.  Musculoskeletal: Positive for myalgias.  Neurological: Positive for headaches. Negative for dizziness, syncope and light-headedness.  Psychiatric/Behavioral: Negative for dysphoric mood.     Patient Active Problem List   Diagnosis Date Noted  . Psoriasis 03/10/2015  . Cholecystitis 02/16/2015  . Gallstones 02/15/2015  . Right foot pain 11/13/2014  . Ethmoid sinusitis 11/13/2014  . Abdominal pain, epigastric 11/13/2014  . Vitamin D deficiency 11/13/2014  . 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 Sick sinus syndrome ([place]) 07/08/2014  . Chest pain at rest 07/05/2014  . Chronic rhinitis 10/22/2013  . Fatty liver 10/22/2013  . Interstitial cystitis 10/22/2013  . Hip pain, right 10/22/2013  . Chronic insomnia 09/25/2012  . HTN (hypertension) 09/25/2012  . Hyperlipidemia 09/25/2012  . Diabetes mellitus type 2, controlled, without complications ([place]) 65/46/5035  . GERD 12/04/2008  . History of colonic polyps 12/04/2008  . CONSTIPATION 10/21/2008  . Fibromyalgia 10/21/2008  . LACTOSE INTOLERANCE 11/20/2007  . CHEST PAIN 11/20/2007  . Anxiety and depression 08/02/2007  . Osteoporosis 08/02/2007  . Diverticulosis of large intestine 11/16/2006  . EXTERNAL HEMORRHOIDS 05/12/2005     Prior to Admission medications   Medication Sig Start Date End Date Taking? Authorizing Provider  albuterol (PROVENTIL HFA;VENTOLIN HFA) 108 (90 BASE) MCG/ACT inhaler Inhale 2 puffs into the lungs every 6 (six) hours as needed for wheezing. 08/15/13  Audwin Semper, PA-C  atorvastatin (LIPITOR) 20 MG tablet  Take 1 tablet (20 mg total) by mouth daily. Patient not taking: Reported on 02/11/2016 01/01/16   [name], PA-C  Azilsartan Medoxomil (EDARBI) 40 MG TABS Take 20 mg by mouth every morning.    Historical Provider, MD  blood glucose meter kit and supplies KIT Dispense based on patient and insurance preference. Use daily as directed. ICD-10: E11.9 06/09/15   [name], PA-C  cholecalciferol (VITAMIN D) 1000 units tablet Take 1 tablet (1,000 Units total) by mouth daily. 10/06/15   Arnold Kester, PA-C  cyclobenzaprine (FLEXERIL) 10 MG tablet Take 10 mg by mouth 3 (three) times daily as needed for muscle spasms.    Historical Provider, MD  Desoximetasone (TOPICORT) 0.25 % ointment Apply 1 application topically 2 (two) times daily as needed (for psoriasis). 11/13/14   Kadeisha Betsch, PA-C  dexlansoprazole (DEXILANT) 60 MG capsule Take 60 mg by mouth daily as needed (for acid reflux).    Historical Provider, MD  diazepam (VALIUM) 5 MG tablet Take 1 tablet (5 mg total) by mouth daily as needed for anxiety or muscle spasms. 11/26/15   Verita Kuroda, PA-C  docusate sodium (COLACE) 100 MG capsule TAKE 1 CAPSULE (100 MG TOTAL) BY MOUTH DAILY AS NEEDED FOR MILD CONSTIPATION. 10/23/14   Celisse Ciulla, PA-C  escitalopram (LEXAPRO) 20 MG tablet Take 1 tablet (20 mg total) by mouth daily. 10/06/15   Frederika Hukill, PA-C  ezetimibe (ZETIA) 10 MG tablet Take 10 mg by mouth daily.    Historical Provider, MD  fluticasone (FLONASE) 50 MCG/ACT nasal spray USE 2 SPRAYS INTO EACH NOSTRIL EVERY DAY 08/20/15   Mancel Bale, PA-C  ipratropium (ATROVENT) 0.03 % nasal spray USE 2 SPRAYS INTO THE NOSE EVERY 12 (TWELVE) HOURS. 03/10/15   Jahki Witham, PA-C  loteprednol (LOTEMAX) 0.5 % ophthalmic suspension Place 1 drop into both eyes 4 (four) times daily.     Historical Provider, MD  meloxicam (MOBIC) 15 MG tablet TAKE 1 TABLET BY MOUTH EVERY DAY 02/24/16   Anayla Giannetti, PA-C  Multiple Vitamin (MULTIVITAMIN) tablet Take 1 tablet  by mouth daily.    Historical Provider, MD  NITROSTAT 0.4 MG SL tablet Place 0.4 mg under the tongue every 5 (five) minutes as needed for chest pain. For chest pain. 11/24/11   Historical Provider, MD  oxyCODONE-acetaminophen (PERCOCET) 10-325 MG tablet Take 1.5 tablets by mouth 2 (two) times daily. 03/30/16   Erinn Mendosa, PA-C  Probiotic Product (PROBIOTIC DAILY) CAPS Take 1 capsule by mouth 2 (two) times a week.    Historical Provider, MD  ranitidine (ZANTAC) 150 MG tablet Take 150 mg by mouth as needed for heartburn.     Historical Provider, MD  zolpidem (AMBIEN) 10 MG tablet Take 10 mg by mouth at bedtime as needed for sleep.  09/28/13   Historical Provider, MD     Allergies  Allergen Reactions  . Codeine Other (See Comments)    HALLUCINATIONS   . Propoxyphene N-Acetaminophen Other (See Comments)    HALLUCINATIONS  . Sulfonamide Derivatives Hives, Itching and Swelling  . Alka-Seltzer [Aspirin Effervescent] Other (See Comments)    "like she's fading away"  . Norvasc [Amlodipine Besylate]   . Red Dye   . Singulair [Montelukast Sodium] Other (See Comments)    Increased depression, sadness  . Iohexol Nausea Only    JITTERY    . Latex Hives and Itching       Objective:  Physical Exam  Constitutional: She  is oriented to person, place, and time. She appears well-developed and well-nourished. She is active and cooperative. No distress.  BP 126/86   Pulse 84   Temp 98.1 F (36.7 C) (Oral)   Resp 16   Ht _0  (1.702 m)   Wt 160 lb 6.4 oz (72.8 kg)   SpO2 98%   BMI 25.12 kg/m   HENT:  Head: Normocephalic and atraumatic.  Right Ear: Hearing normal.  Left Ear: Hearing normal.  Eyes: Conjunctivae are normal. No scleral icterus.  Neck: Normal range of motion. Neck supple. No thyromegaly present.  Cardiovascular: Normal rate, regular rhythm and normal heart sounds.   Pulses:      Radial pulses are 2+ on the right side, and 2+ on the left side.  Pulmonary/Chest: Effort normal  and breath sounds normal.  Musculoskeletal:       Right hip: She exhibits decreased range of motion, decreased strength, tenderness and bony tenderness. She exhibits no swelling, no crepitus, no deformity and no laceration.       Lumbar back: She exhibits decreased range of motion and tenderness. She exhibits no bony tenderness.       Back:       Legs: Lymphadenopathy:       Head (right side): No tonsillar, no preauricular, no posterior auricular and no occipital adenopathy present.       Head (left side): No tonsillar, no preauricular, no posterior auricular and no occipital adenopathy present.    She has no cervical adenopathy.       Right: No supraclavicular adenopathy present.       Left: No supraclavicular adenopathy present.  Neurological: She is alert and oriented to person, place, and time. No sensory deficit.  Skin: Skin is warm, dry and intact. No rash noted. No cyanosis or erythema. Nails show no clubbing.  Psychiatric: She has a normal mood and affect. Her speech is normal and behavior is normal.    PROCEDURE: Verbal consent obtained. Point of maximum tenderness marked. Area prepped in sterile fashion. Ethyl chloride spray for local anesthesia. RIGHT tronchanteric bursa injected with 1 cc 40 mg/mL Depo-Medrol and 5 cc 1% lidocaine plain.  Cleansed and dressed. Patient noted improvement in pain following procedure.       Assessment & Plan:   1. Controlled type 2 diabetes mellitus without complication, without long-term current use of insulin ([place]) Await labs. Adjust regimen as indicated by results. - Comprehensive metabolic panel - Hemoglobin A1c - Microalbumin, urine  2. Hip pain, right Bursitis. Injection as above.  3. Diarrhea, unspecified type Unclear etiology. Referral to Dr. Carol Ada. - Comprehensive metabolic panel - CBC with Differential/Platelet - Ambulatory referral to Gastroenterology  4. Fibromyalgia Stable.  5. Anxiety and  depression Stable.  6. Hyperlipidemia, unspecified hyperlipidemia type Await labs. Adjust regimen as indicated by results. - Lipid panel  7. Chronic insomnia Stable.  8. History of colonic polyps - Ambulatory referral to Gastroenterology  9. Diverticulosis of large intestine without hemorrhage - Ambulatory referral to Gastroenterology  10. Need for influenza vaccination - Flu Vaccine QUAD 36+ mos IM  Return in about 5 months (around 09/10/2016).   Fara Chute, PA-C Physician Assistant-Certified [facility]

## 2016-04-12 NOTE — Progress Notes (Signed)
 Subjective:    Patient ID: [name], female    DOB: 04/07/1956, 60 y.o.   MRN: [id number]  Chief Complaint  Patient presents with  . Diabetes    Flu shot   Patient Care Team: [name] Jeffery, PA-C as PCP - General (Physician Assistant) Ajay Kadakia, MD as Consulting Physician (Cardiology) Richard Ramos, MD as Consulting Physician (Physical Medicine and Rehabilitation) Shaili Deveshwar, MD as Consulting Physician (Rheumatology) Matthew Martin, MD as Consulting Physician (General Surgery) Lloyd J Peterson, MD (Inactive) as Attending Physician (Urology) David Spivey, MD as Attending Physician (Pain Medicine) Gretchen Adkins, MD as Consulting Physician (Obstetrics and Gynecology)  HPI: Presents for diabetes, hyperlipidemia, and hypertension follow-up. Also wants to discuss hip pain, ongoing diarrhea, and receive flu vaccine.   Patient with diabetes mellitus currently controlled with diet and exercise. States she does not check her blood sugar at home because she is not able to stick herself and waiting for glucometer that is coming out in which you don't have to. Notices that her urine is sometimes "syrupy" or thick, about 1-2x/week. Denies polyuria, polydipsia, dysuria, hematuria, abnormal urine color or odor. Denies hypoglycemic episodes. States for now her only exercise is physical therapy because she had a recent fall. Used to walk more for exercise and plans to get back to it once her hip pain and fibromyalgia is under control. Last appointment with her eye doctor was a month or two ago per patient and was prescribed Lotemax for "fibromyalgia which is progressed to her eyes".  She has been having ongoing diarrhea and abdominal pain for 3 months. She wanted to get a colonoscopy by states her doctor would not allow it because of her pacemaker. She has been using Lomotil and eating Activia which has provided some mild improvement. History of polyps, IBS, and bowel reconstruction.  Stopped  taking statin because was worried the diarrhea was being caused by that.  Fibromyalgia controlled with Mobic and Flexeril which is used as needed for pain, states she usually uses it 1-2x/week. Increasing hip pain from her bursitis. Last steroid injection for the bursitis was 2 years ago per patient. Notes pain in hips, legs, shoulders with her fibromyalgia.  Blood pressure currently controlled. States she gets occasional headaches and will drink water and take half of a blood pressure pill when she gets them which does help. States she gets anginal chest pain about once a week, occasional radiation down left arm. She states it is increased with increased exertion, occasional SOB (controlled with  Inhaler). Denies palpitations, N/V. Has pacemaker and followed by cardiology with visits about every 3 months.  Notes vision changes -- occasionally gets blurry vision (not associated with headaches), states she puts her glasses on and sits down to relieve it.  States her anxiety has been very bad recently. States she takes her Valium about 2x/month and Ambien 1-3x/month. Sometimes she has trouble sleeping. Her depression has been well-controlled on the Lexapro which she takes daily. Notes that Singulair often makes her depression worse so she will cut it out of her regimen if she notices it is getting bad.  States she has had some sinus congestion recently for which she uses the Flonase nasal spray which has kept it pretty well-controlled. Notes some coughing at night and wheezing. Denies fevers/chills, chest tightness, SOB, PND, sinus pain.   Plans to receive flu vaccine today.  Review of Systems  Constitutional: Positive for appetite change. Negative for chills, fever and unexpected weight change.  HENT: Positive   for congestion. Negative for ear discharge, ear pain, postnasal drip, rhinorrhea, sinus pain, sinus pressure, tinnitus and trouble swallowing.   Eyes: Positive for visual disturbance. Negative  for photophobia, pain and redness.  Respiratory: Positive for cough and wheezing. Negative for chest tightness and shortness of breath.   Cardiovascular: Positive for chest pain. Negative for palpitations and leg swelling.  Gastrointestinal: Positive for abdominal distention, abdominal pain and diarrhea. Negative for blood in stool, constipation, nausea and vomiting.  Endocrine: Positive for polyphagia. Negative for polydipsia and polyuria.  Genitourinary: Negative for difficulty urinating, dysuria, frequency, hematuria and urgency.  Musculoskeletal: Positive for myalgias.  Neurological: Positive for headaches. Negative for dizziness, syncope and light-headedness.  Psychiatric/Behavioral: Negative for dysphoric mood.   Allergies  Allergen Reactions  . Codeine Other (See Comments)    HALLUCINATIONS   . Propoxyphene N-Acetaminophen Other (See Comments)    HALLUCINATIONS  . Sulfonamide Derivatives Hives, Itching and Swelling  . Alka-Seltzer [Aspirin Effervescent] Other (See Comments)    "like she's fading away"  . Norvasc [Amlodipine Besylate]   . Red Dye   . Singulair [Montelukast Sodium] Other (See Comments)    Increased depression, sadness  . Iohexol Nausea Only    JITTERY    . Latex Hives and Itching   Prior to Admission medications   Medication Sig Start Date End Date Taking? Authorizing Provider  albuterol (PROVENTIL HFA;VENTOLIN HFA) 108 (90 BASE) MCG/ACT inhaler Inhale 2 puffs into the lungs every 6 (six) hours as needed for wheezing. 08/15/13  Yes [name] Jeffery, PA-C  Azilsartan Medoxomil (EDARBI) 40 MG TABS Take 20 mg by mouth every morning.   Yes Historical Provider, MD  blood glucose meter kit and supplies KIT Dispense based on patient and insurance preference. Use daily as directed. ICD-10: E11.9 06/09/15  Yes [name] Jeffery, PA-C  cholecalciferol (VITAMIN D) 1000 units tablet Take 1 tablet (1,000 Units total) by mouth daily. 10/06/15  Yes [name] Jeffery, PA-C    cyclobenzaprine (FLEXERIL) 10 MG tablet Take 10 mg by mouth 3 (three) times daily as needed for muscle spasms.   Yes Historical Provider, MD  Desoximetasone (TOPICORT) 0.25 % ointment Apply 1 application topically 2 (two) times daily as needed (for psoriasis). 11/13/14  Yes [name] Jeffery, PA-C  dexlansoprazole (DEXILANT) 60 MG capsule Take 60 mg by mouth daily as needed (for acid reflux).   Yes Historical Provider, MD  diazepam (VALIUM) 5 MG tablet Take 1 tablet (5 mg total) by mouth daily as needed for anxiety or muscle spasms. 11/26/15  Yes [name] Jeffery, PA-C  diphenoxylate-atropine (LOMOTIL) 2.5-0.025 MG tablet Take by mouth 4 (four) times daily as needed for diarrhea or loose stools.   Yes Historical Provider, MD  docusate sodium (COLACE) 100 MG capsule TAKE 1 CAPSULE (100 MG TOTAL) BY MOUTH DAILY AS NEEDED FOR MILD CONSTIPATION. 10/23/14  Yes [name] Jeffery, PA-C  escitalopram (LEXAPRO) 20 MG tablet Take 1 tablet (20 mg total) by mouth daily. 10/06/15  Yes [name] Jeffery, PA-C  ezetimibe (ZETIA) 10 MG tablet Take 10 mg by mouth daily.   Yes Historical Provider, MD  fluticasone (FLONASE) 50 MCG/ACT nasal spray USE 2 SPRAYS INTO EACH NOSTRIL EVERY DAY 08/20/15  Yes Sarah L Weber, PA-C  ipratropium (ATROVENT) 0.03 % nasal spray USE 2 SPRAYS INTO THE NOSE EVERY 12 (TWELVE) HOURS. 03/10/15  Yes [name] Jeffery, PA-C  loteprednol (LOTEMAX) 0.5 % ophthalmic suspension Place 1 drop into both eyes 4 (four) times daily.    Yes Historical Provider, MD  meloxicam (MOBIC) 15   MG tablet TAKE 1 TABLET BY MOUTH EVERY DAY 02/24/16  Yes [name] Jeffery, PA-C  Multiple Vitamin (MULTIVITAMIN) tablet Take 1 tablet by mouth daily.   Yes Historical Provider, MD  NITROSTAT 0.4 MG SL tablet Place 0.4 mg under the tongue every 5 (five) minutes as needed for chest pain. For chest pain. 11/24/11  Yes Historical Provider, MD  oxyCODONE-acetaminophen (PERCOCET) 10-325 MG tablet Take 1.5 tablets by mouth 2 (two) times daily. 03/30/16   Yes [name] Jeffery, PA-C  Probiotic Product (PROBIOTIC DAILY) CAPS Take 1 capsule by mouth 2 (two) times a week.   Yes Historical Provider, MD  ranitidine (ZANTAC) 150 MG tablet Take 150 mg by mouth as needed for heartburn.    Yes Historical Provider, MD  zolpidem (AMBIEN) 10 MG tablet Take 10 mg by mouth at bedtime as needed for sleep.  09/28/13  Yes Historical Provider, MD  atorvastatin (LIPITOR) 20 MG tablet Take 1 tablet (20 mg total) by mouth daily. Patient not taking: Reported on 04/12/2016 01/01/16   [name], PA-C   Patient Active Problem List   Diagnosis Date Noted  . Psoriasis 03/10/2015  . Cholecystitis 02/16/2015  . Gallstones 02/15/2015  . Right foot pain 11/13/2014  . Ethmoid sinusitis 11/13/2014  . Abdominal pain, epigastric 11/13/2014  . Vitamin D deficiency 11/13/2014  . 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 Sick sinus syndrome ([place]) 07/08/2014  . Chest pain at rest 07/05/2014  . Chronic rhinitis 10/22/2013  . Fatty liver 10/22/2013  . Interstitial cystitis 10/22/2013  . Hip pain, right 10/22/2013  . Chronic insomnia 09/25/2012  . HTN (hypertension) 09/25/2012  . Hyperlipidemia 09/25/2012  . Diabetes mellitus type 2, controlled, without complications ([place]) 24/26/8341  . GERD 12/04/2008  . History of colonic polyps 12/04/2008  . CONSTIPATION 10/21/2008  . Fibromyalgia 10/21/2008  . LACTOSE INTOLERANCE 11/20/2007  . CHEST PAIN 11/20/2007  . Anxiety and depression 08/02/2007  . Osteoporosis 08/02/2007  . Diverticulosis of large intestine 11/16/2006  . EXTERNAL HEMORRHOIDS 05/12/2005       Objective:   Physical Exam  Constitutional: She is oriented to person, place, and time. She appears well-developed and well-nourished.  HENT:  Head: Normocephalic and atraumatic.  Mouth/Throat: No oropharyngeal exudate.  Eyes: Conjunctivae are normal. Pupils are equal, round, and reactive to light. Right eye exhibits no discharge. Left eye exhibits no discharge. No scleral  icterus.  Neck: Normal range of motion. Neck supple. No tracheal deviation present. No thyromegaly present.  Pulmonary/Chest: Effort normal and breath sounds normal. No respiratory distress. She has no wheezes. She has no rales.  Abdominal: Soft. She exhibits no distension. There is no tenderness. There is no rebound and no guarding.  Mild TTP in RLQ and LLQ. Hyperactive bowel sounds in all four quadrants.  Musculoskeletal:       Right hip: She exhibits decreased range of motion, tenderness and bony tenderness. She exhibits normal strength, no swelling, no crepitus and no deformity.       Left hip: She exhibits normal range of motion, normal strength, no tenderness, no bony tenderness, no swelling and no crepitus.       Right knee: Normal.       Left knee: Normal.       Legs: Neurological: She is alert and oriented to person, place, and time.  Skin: Skin is warm and dry. Rash noted. No erythema.  Psoriasis noted on bottom of left foot; patient states she sees dermatology for management of this.  Psychiatric: She has a normal mood  and affect. Her behavior is normal.    Diabetic Foot Exam - Simple   Simple Foot Form Diabetic Foot exam was performed with the following findings:  Yes 04/12/2016  9:45 AM  Visual Inspection No deformities, no ulcerations, no other skin breakdown bilaterally:  Yes Sensation Testing Intact to touch and monofilament testing bilaterally:  Yes Pulse Check Posterior Tibialis and Dorsalis pulse intact bilaterally:  Yes Comments     Hip Bursitis Steroid Injection: Verbal consent obtained from patient. Injection site cleansed with alcohol then sprayed with Ethyl chloride spray. 5 cc 1% Lidocaine and 1 cc of 40 mg/mL Toradol injected with 22g needle. Patient tolerated procedure well.     Assessment & Plan:  1. Controlled type 2 diabetes mellitus without complication, without long-term current use of insulin (HCC) Continue diet and exercise for control of blood  sugar. Start checking blood sugar at home more regularly once you have new glucometer. CMP, HgbA1C and microalbumin pending, will update patient with results and adjust/start medication regimen upon review of results. - Comprehensive metabolic panel - Hemoglobin A1c - Microalbumin, urine  2. Hip pain, right Kenalog injection administered in clinic today. Advised patient to continue to rest and use Mobic and Flexeril as needed for pain and RTC if not improving or worsening.  3. Abdominal pain, epigastric CBC and CMP pending. Continue Activia and Lomotil as needed for diarrhea. Placed referral to GI for colonoscopy. - Comprehensive metabolic panel - CBC with Differential/Platelet  4. Fibromyalgia Currently controlled with Mobic and Flexeril, continue medications as needed for pain.  5. Anxiety and depression Currently well-controlled on medication regimen. Continue Lexapro, Ambien, and Valium as needed for depression and anxiety.  6. Hyperlipidemia, unspecified hyperlipidemia type Advised patient that the diarrhea is likely not due to the statin as it has not subsided with discontinuation. Recommended re-starting Atorvastatin 20 mg daily to keep cholesterol under control and monitor diarrhea.  - Lipid panel  7. Need for influenza vaccination - Flu Vaccine QUAD 36+ mos IM

## 2016-04-12 NOTE — Patient Instructions (Signed)
IF you received an x-ray today, you will receive an invoice from [facility]. Please contact [facility] at [phone] with questions or concerns regarding your invoice.   IF you received labwork today, you will receive an invoice from [employer]. Please contact [facility] at [phone] with questions or concerns regarding your invoice.   Our billing staff will not be able to assist you with questions regarding bills from these companies.  You will be contacted with the lab results as soon as they are available. The fastest way to get your results is to activate your My Chart account. Instructions are located on the last page of this paperwork. If you have not heard from [place] regarding the results in 2 weeks, please contact this office.    Influenza (Flu) Vaccine (Inactivated or Recombinant):  1. Why get vaccinated? Influenza ("flu") is a contagious disease that spreads around the [place] every year, usually between October and May. Flu is caused by influenza viruses, and is spread mainly by coughing, sneezing, and close contact. Anyone can get flu. Flu strikes suddenly and can last several days. Symptoms vary by age, but can include:  fever/chills  sore throat  muscle aches  fatigue  cough  headache  runny or stuffy nose Flu can also lead to pneumonia and blood infections, and cause diarrhea and seizures in children. If you have a medical condition, such as heart or lung disease, flu can make it worse. Flu is more dangerous for some people. Infants and young children, people 90 years of age and older, pregnant women, and people with certain health conditions or a weakened immune system are at greatest risk. Each year thousands of people in the [place] die from flu, and many more are hospitalized. Flu vaccine can:  keep you from getting flu,  make flu less severe if you do get it, and  keep you from spreading flu to  your family and other people. 2. Inactivated and recombinant flu vaccines A dose of flu vaccine is recommended every flu season. Children 6 months through 28 years of age may need two doses during the same flu season. Everyone else needs only one dose each flu season. Some inactivated flu vaccines contain a very small amount of a mercury-based preservative called thimerosal. Studies have not shown thimerosal in vaccines to be harmful, but flu vaccines that do not contain thimerosal are available. There is no live flu virus in flu shots. They cannot cause the flu. There are many flu viruses, and they are always changing. Each year a new flu vaccine is made to protect against three or four viruses that are likely to cause disease in the upcoming flu season. But even when the vaccine doesn't exactly match these viruses, it may still provide some protection. Flu vaccine cannot prevent:  flu that is caused by a virus not covered by the vaccine, or  illnesses that look like flu but are not. It takes about 2 weeks for protection to develop after vaccination, and protection lasts through the flu season. 3. Some people should not get this vaccine Tell the person who is giving you the vaccine:  If you have any severe, life-threatening allergies. If you ever had a life-threatening allergic reaction after a dose of flu vaccine, or have a severe allergy to any part of this vaccine, you may be advised not to get vaccinated. Most, but not all, types of flu vaccine contain a small amount of egg protein.  If you ever had Guillain-Barre Syndrome (also called GBS). Some people with a history of GBS should not get this vaccine. This should be discussed with your doctor.  If you are not feeling well. It is usually okay to get flu vaccine when you have a mild illness, but you might be asked to come back when you feel better. 4. Risks of a vaccine reaction With any medicine, including vaccines, there is a chance of  reactions. These are usually mild and go away on their own, but serious reactions are also possible. Most people who get a flu shot do not have any problems with it. Minor problems following a flu shot include:  soreness, redness, or swelling where the shot was given  hoarseness  sore, red or itchy eyes  cough  fever  aches  headache  itching  fatigue If these problems occur, they usually begin soon after the shot and last 1 or 2 days. More serious problems following a flu shot can include the following:  There may be a small increased risk of Guillain-Barre Syndrome (GBS) after inactivated flu vaccine. This risk has been estimated at 1 or 2 additional cases per million people vaccinated. This is much lower than the risk of severe complications from flu, which can be prevented by flu vaccine.  Young children who get the flu shot along with pneumococcal vaccine (PCV13) and/or DTaP vaccine at the same time might be slightly more likely to have a seizure caused by fever. Ask your doctor for more information. Tell your doctor if a child who is getting flu vaccine has ever had a seizure. Problems that could happen after any injected vaccine:  People sometimes faint after a medical procedure, including vaccination. Sitting or lying down for about 15 minutes can help prevent fainting, and injuries caused by a fall. Tell your doctor if you feel dizzy, or have vision changes or ringing in the ears.  Some people get severe pain in the shoulder and have difficulty moving the arm where a shot was given. This happens very rarely.  Any medication can cause a severe allergic reaction. Such reactions from a vaccine are very rare, estimated at about 1 in a million doses, and would happen within a few minutes to a few hours after the vaccination. As with any medicine, there is a very remote chance of a vaccine causing a serious injury or death. The safety of vaccines is always being monitored. For  more information, visit: http://www.aguilar.org/ 5. What if there is a serious reaction? What should I look for?  Look for anything that concerns you, such as signs of a severe allergic reaction, very high fever, or unusual behavior. Signs of a severe allergic reaction can include hives, swelling of the face and throat, difficulty breathing, a fast heartbeat, dizziness, and weakness. These would start a few minutes to a few hours after the vaccination. What should I do?  If you think it is a severe allergic reaction or other emergency that can't wait, call 9-1-1 and get the person to the nearest hospital. Otherwise, call your doctor.  Reactions should be reported to the Vaccine Adverse Event Reporting System (VAERS). Your doctor should file this report, or you can do it yourself through the VAERS web site at www.vaers.SamedayNews.es, or by calling (253)546-8122. VAERS does not give medical advice. 6. The National Vaccine Injury Compensation Program The National Vaccine Injury Compensation Program (VICP) is a federal program that was created to compensate people who may have been  injured by certain vaccines. Persons who believe they may have been injured by a vaccine can learn about the program and about filing a claim by calling 551 029 2948 or visiting the [place] website at GoldCloset.com.ee. There is a time limit to file a claim for compensation. 7. How can I learn more?  Ask your healthcare provider. He or she can give you the vaccine package insert or suggest other sources of information.  Call your local or state health department.  Contact the Centers for Disease Control and Prevention (CDC):  Call 847-567-4702 (1-800-CDC-INFO) or  Visit CDC's website at https://gibson.com/ Vaccine Information Statement Inactivated Influenza Vaccine (01/10/2014)   This information is not intended to replace advice given to you by your health care provider. Make sure you discuss any  questions you have with your health care provider.   Document Released: 03/17/2006 Document Revised: 06/13/2014 Document Reviewed: 01/13/2014 Elsevier Interactive Patient Education [employer].

## 2016-04-13 LAB — MICROALBUMIN, URINE: Microalb, Ur: 0.5 mg/dL

## 2016-04-21 ENCOUNTER — Ambulatory Visit (INDEPENDENT_AMBULATORY_CARE_PROVIDER_SITE_OTHER): Payer: 59 | Admitting: Psychiatry

## 2016-04-21 DIAGNOSIS — F331 Major depressive disorder, recurrent, moderate: Secondary | ICD-10-CM | POA: Diagnosis not present

## 2016-04-25 ENCOUNTER — Telehealth: Payer: Self-pay

## 2016-04-25 NOTE — Telephone Encounter (Signed)
OV notes for gastro referral are not complete. OV was 11/7. Thank you!

## 2016-04-27 MED ORDER — METHYLPREDNISOLONE ACETATE 40 MG/ML IJ SUSP
40.0000 mg | Freq: Once | INTRAMUSCULAR | Status: AC
  Administered 2016-04-12: 40 mg

## 2016-04-29 ENCOUNTER — Encounter: Payer: Self-pay | Admitting: Physician Assistant

## 2016-04-29 DIAGNOSIS — M797 Fibromyalgia: Secondary | ICD-10-CM

## 2016-04-30 ENCOUNTER — Encounter: Payer: Self-pay | Admitting: Physician Assistant

## 2016-05-02 ENCOUNTER — Encounter: Payer: Self-pay | Admitting: Physician Assistant

## 2016-05-02 DIAGNOSIS — J4540 Moderate persistent asthma, uncomplicated: Secondary | ICD-10-CM

## 2016-05-04 MED ORDER — OXYCODONE-ACETAMINOPHEN 10-325 MG PO TABS
1.5000 | ORAL_TABLET | Freq: Two times a day (BID) | ORAL | 0 refills | Status: DC
Start: 2016-05-04 — End: 2016-06-01

## 2016-05-04 NOTE — Telephone Encounter (Signed)
Meds ordered this encounter  Medications  . oxyCODONE-acetaminophen (PERCOCET) 10-325 MG tablet    Sig: Take 1.5 tablets by mouth 2 (two) times daily.    Dispense:  90 tablet    Refill:  0

## 2016-05-04 NOTE — Telephone Encounter (Signed)
Last RF 10/25, OV 11/7

## 2016-05-04 NOTE — Telephone Encounter (Signed)
[name], you just saw pt for follow up on many chronic problems 11/7, but I don't see this med discussed or even on current list at [facility]. RF?

## 2016-05-05 MED ORDER — BECLOMETHASONE DIPROPIONATE 40 MCG/ACT IN AERS: 1.0000 | INHALATION_SPRAY | Freq: Two times a day (BID) | RESPIRATORY_TRACT | 4 refills | Status: DC

## 2016-05-05 NOTE — Telephone Encounter (Signed)
Meds ordered this encounter  Medications  . beclomethasone (QVAR) 40 MCG/ACT inhaler    Sig: Inhale 1-2 puffs into the lungs 2 (two) times daily.    Dispense:  3 Inhaler    Refill:  4    Patient notified via My Chart.

## 2016-05-10 ENCOUNTER — Telehealth: Payer: Self-pay

## 2016-05-10 NOTE — Telephone Encounter (Signed)
Pt is calling the inhaler is not covered by her insurance and she would like [name] to send in a different type that her insurance will cover    beclomethasone (QVAR) 40 MCG/ACT inhaler      Please advise  [phone]

## 2016-05-12 MED ORDER — FLUTICASONE PROPIONATE HFA 110 MCG/ACT IN AERO: 1.0000 | INHALATION_SPRAY | Freq: Two times a day (BID) | RESPIRATORY_TRACT | 3 refills | Status: DC

## 2016-05-12 NOTE — Telephone Encounter (Signed)
Meds ordered this encounter  Medications  . fluticasone (FLOVENT HFA) 110 MCG/ACT inhaler    Sig: Inhale 1-2 puffs into the lungs 2 (two) times daily.    Dispense:  3 Inhaler    Refill:  3    Order Specific Question:   Supervising Provider    Answer:   Brigitte Pulse, EVA N [[id number]]

## 2016-05-12 NOTE — Telephone Encounter (Signed)
Pharm sent message that ins prefers either Arnuity or Flovent.

## 2016-05-13 NOTE — Telephone Encounter (Signed)
Notified pt new rx sent.

## 2016-05-16 ENCOUNTER — Ambulatory Visit (INDEPENDENT_AMBULATORY_CARE_PROVIDER_SITE_OTHER): Payer: Medicare Other | Admitting: *Deleted

## 2016-05-16 DIAGNOSIS — I495 Sick sinus syndrome: Secondary | ICD-10-CM | POA: Diagnosis not present

## 2016-05-16 NOTE — Progress Notes (Signed)
Remote pacemaker transmission.   

## 2016-05-18 ENCOUNTER — Telehealth: Payer: Self-pay

## 2016-05-18 ENCOUNTER — Ambulatory Visit: Payer: Medicare Other | Admitting: Rheumatology

## 2016-05-18 NOTE — Telephone Encounter (Signed)
Patient needs forms completed by [name] for her disability. I have completed what I could and highlighted the areas that need to be completed. I will place the forms in your box on 05/19/16 if you could please return them to the FMLA/Disabiltiy box at the [address] checkout desk within 5-7 business days. thank you

## 2016-05-19 LAB — CUP PACEART REMOTE DEVICE CHECK
Brady Statistic AP VS Percent: 99.1 %
Brady Statistic AS VP Percent: 0 %
Implantable Lead Location: 753859
Implantable Lead Model: 5076
Implantable Lead Model: 5076
Lead Channel Impedance Value: 418 Ohm
Lead Channel Pacing Threshold Amplitude: 0.875 V
Lead Channel Pacing Threshold Pulse Width: 0.4 ms
Lead Channel Sensing Intrinsic Amplitude: 24.75 mV
Lead Channel Sensing Intrinsic Amplitude: 24.75 mV
Lead Channel Sensing Intrinsic Amplitude: 4.875 mV
Lead Channel Setting Pacing Amplitude: 2.5 V
Lead Channel Setting Pacing Pulse Width: 0.4 ms
MDC IDC LEAD IMPLANT DT: 20160202
MDC IDC LEAD IMPLANT DT: 20160202
MDC IDC LEAD LOCATION: 753860
MDC IDC MSMT BATTERY REMAINING LONGEVITY: 95 mo
MDC IDC MSMT BATTERY VOLTAGE: 3.01 V
MDC IDC MSMT LEADCHNL RA IMPEDANCE VALUE: 475 Ohm
MDC IDC MSMT LEADCHNL RA PACING THRESHOLD AMPLITUDE: 0.625 V
MDC IDC MSMT LEADCHNL RA PACING THRESHOLD PULSEWIDTH: 0.4 ms
MDC IDC MSMT LEADCHNL RA SENSING INTR AMPL: 4.875 mV
MDC IDC MSMT LEADCHNL RV IMPEDANCE VALUE: 437 Ohm
MDC IDC MSMT LEADCHNL RV IMPEDANCE VALUE: 532 Ohm
MDC IDC PG IMPLANT DT: 20160202
MDC IDC SESS DTM: 20171211141330
MDC IDC SET LEADCHNL RA PACING AMPLITUDE: 2 V
MDC IDC SET LEADCHNL RV SENSING SENSITIVITY: 2.8 mV
MDC IDC STAT BRADY AP VP PERCENT: 0.05 %
MDC IDC STAT BRADY AS VS PERCENT: 0.84 %
MDC IDC STAT BRADY RA PERCENT PACED: 98.93 %
MDC IDC STAT BRADY RV PERCENT PACED: 0.05 %

## 2016-05-20 ENCOUNTER — Encounter: Payer: Self-pay | Admitting: Cardiology

## 2016-05-20 NOTE — Telephone Encounter (Signed)
I've completed and signed pages 1-3, but the last page looks like it's the 2nd of 3 pages and separate from the others.  I've returned the forms to the FMLA/Disability box. Please advise.

## 2016-05-24 ENCOUNTER — Telehealth: Payer: Self-pay | Admitting: Family Medicine

## 2016-05-24 NOTE — Telephone Encounter (Signed)
Pt calling wanting another RX for a inhaler she says the on e [name] gave her to fill cost to much for her copay

## 2016-05-24 NOTE — Telephone Encounter (Signed)
Please advise if inhaler can be changed

## 2016-05-26 ENCOUNTER — Ambulatory Visit (INDEPENDENT_AMBULATORY_CARE_PROVIDER_SITE_OTHER): Payer: 59 | Admitting: Psychiatry

## 2016-05-26 DIAGNOSIS — F331 Major depressive disorder, recurrent, moderate: Secondary | ICD-10-CM

## 2016-05-26 NOTE — Telephone Encounter (Signed)
Paperwork scanned and faxed to company on 05/26/16

## 2016-05-26 NOTE — Telephone Encounter (Signed)
She needs to check with her pharmacy benefit plan-they have a formulary, and may have a different one in the same family that is preferred. I'm happy to change to the preferred product.

## 2016-06-01 ENCOUNTER — Encounter: Payer: Self-pay | Admitting: Physician Assistant

## 2016-06-01 DIAGNOSIS — M797 Fibromyalgia: Secondary | ICD-10-CM

## 2016-06-01 MED ORDER — OXYCODONE-ACETAMINOPHEN 10-325 MG PO TABS: 1.5000 | ORAL_TABLET | Freq: Two times a day (BID) | ORAL | 0 refills | Status: DC

## 2016-06-01 NOTE — Telephone Encounter (Signed)
Meds ordered this encounter  Medications  . oxyCODONE-acetaminophen (PERCOCET) 10-325 MG tablet    Sig: Take 1.5 tablets by mouth 2 (two) times daily.    Dispense:  90 tablet    Refill:  0    Order Specific Question:   Supervising Provider    Answer:   Shawnee Knapp [phone]   Patient notified via My Chart.

## 2016-06-21 ENCOUNTER — Encounter: Payer: Self-pay | Admitting: Physician Assistant

## 2016-06-30 ENCOUNTER — Encounter: Payer: Self-pay | Admitting: Physician Assistant

## 2016-06-30 DIAGNOSIS — M797 Fibromyalgia: Secondary | ICD-10-CM

## 2016-07-01 ENCOUNTER — Telehealth: Payer: Self-pay | Admitting: Emergency Medicine

## 2016-07-01 MED ORDER — OXYCODONE-ACETAMINOPHEN 10-325 MG PO TABS: 1.5000 | ORAL_TABLET | Freq: Two times a day (BID) | ORAL | 0 refills | Status: DC

## 2016-07-01 NOTE — Telephone Encounter (Signed)
Pt aware prescription Oxycodone is ready for pick up

## 2016-07-01 NOTE — Telephone Encounter (Signed)
Meds ordered this encounter  Medications  . oxyCODONE-acetaminophen (PERCOCET) 10-325 MG tablet    Sig: Take 1.5 tablets by mouth 2 (two) times daily.    Dispense:  90 tablet    Refill:  0    Order Specific Question:   Supervising Provider    Answer:   Shawnee Knapp [phone]    Patient notified via My Chart.

## 2016-07-04 ENCOUNTER — Encounter: Payer: Self-pay | Admitting: Student

## 2016-07-04 DIAGNOSIS — M25571 Pain in right ankle and joints of right foot: Secondary | ICD-10-CM | POA: Insufficient documentation

## 2016-07-04 DIAGNOSIS — M25561 Pain in right knee: Secondary | ICD-10-CM

## 2016-07-08 ENCOUNTER — Other Ambulatory Visit: Payer: Self-pay | Admitting: Gastroenterology

## 2016-07-11 ENCOUNTER — Encounter: Payer: Self-pay | Admitting: Physician Assistant

## 2016-07-12 ENCOUNTER — Ambulatory Visit (INDEPENDENT_AMBULATORY_CARE_PROVIDER_SITE_OTHER): Payer: 59 | Admitting: Psychiatry

## 2016-07-12 DIAGNOSIS — F331 Major depressive disorder, recurrent, moderate: Secondary | ICD-10-CM | POA: Diagnosis not present

## 2016-07-14 ENCOUNTER — Telehealth: Payer: Self-pay

## 2016-07-14 NOTE — Telephone Encounter (Signed)
Patient brought in disability forms to be completed by [name] I have completed them based off last years and what she had brought with it. I will place the forms in your box on 07/14/16 if you could please return them to the FMLA/Disability box at the [address] checkout desk with in 5-7 business days. Thank you!

## 2016-07-15 NOTE — Telephone Encounter (Signed)
Paperwork scanned and patient was sent a MyChart message to let her know the forms are ready to be pick up at [facility].

## 2016-07-26 DIAGNOSIS — Z0271 Encounter for disability determination: Secondary | ICD-10-CM

## 2016-08-03 ENCOUNTER — Encounter: Payer: Self-pay | Admitting: Physician Assistant

## 2016-08-03 DIAGNOSIS — M797 Fibromyalgia: Secondary | ICD-10-CM

## 2016-08-04 MED ORDER — AMOXICILLIN-POT CLAVULANATE 875-125 MG PO TABS: 1.0000 | ORAL_TABLET | Freq: Two times a day (BID) | ORAL | 0 refills | Status: AC

## 2016-08-04 NOTE — Telephone Encounter (Signed)
PATIENT STATES SHE SENT A MESSAGE TO [facility] THAT SHE HAS A SINUS INFECTION AND THAT SHE IS IN NEED OF HER PAIN MEDICINE. MICHAEL CLARK SENT A MESSAGE BACK SAYING SHE NEEDS TO COME IN TO BE SEEN. SHE SAID [name] KNOWS THAT SHE HAS CHRONIC PAIN AND IF SHE LETS THIS RAIN HIT HER SHE WILL END UP [place]. BEST PHONE [phone] (CE[facility]) PHARMACY IS [facility] ON [address]. [place]

## 2016-08-05 MED ORDER — OXYCODONE-ACETAMINOPHEN 10-325 MG PO TABS: 1.5000 | ORAL_TABLET | Freq: Two times a day (BID) | ORAL | 0 refills | Status: DC

## 2016-08-05 NOTE — Telephone Encounter (Signed)
Patient notified via My Chart.  Meds ordered this encounter  Medications  . amoxicillin-clavulanate (AUGMENTIN) 875-125 MG tablet    Sig: Take 1 tablet by mouth 2 (two) times daily.    Dispense:  20 tablet    Refill:  0    Order Specific Question:   Supervising Provider    Answer:   SHAW, EVA N [[id number]]  . oxyCODONE-acetaminophen (PERCOCET) 10-325 MG tablet    Sig: Take 1.5 tablets by mouth 2 (two) times daily.    Dispense:  90 tablet    Refill:  0    Order Specific Question:   Supervising Provider    Answer:   Brigitte Pulse, EVA N [[id number]]

## 2016-08-05 NOTE — Telephone Encounter (Signed)
She also needs her pain medications.

## 2016-08-05 NOTE — Addendum Note (Signed)
Addended by: Fara Chute on: 08/05/2016 04:37 PM   Modules accepted: Orders

## 2016-08-06 NOTE — Telephone Encounter (Signed)
We found it, it is in your mailbox to sign

## 2016-08-06 NOTE — Telephone Encounter (Signed)
chelle we do not have this printed rx. Can you reprint and sign on Monday so we can call pt. To pick up. (she stopped in and said she has enough meds til monday

## 2016-08-08 ENCOUNTER — Telehealth: Payer: Self-pay

## 2016-08-08 DIAGNOSIS — M797 Fibromyalgia: Secondary | ICD-10-CM

## 2016-08-08 MED ORDER — OXYCODONE-ACETAMINOPHEN 10-325 MG PO TABS: 1.5000 | ORAL_TABLET | Freq: Two times a day (BID) | ORAL | 0 refills | Status: DC

## 2016-08-08 NOTE — Telephone Encounter (Signed)
Original rx from chelle 08/05/16 destroyed by me, reprinted and signed by clark, up front for pick up and pt aware

## 2016-08-09 DIAGNOSIS — Z0271 Encounter for disability determination: Secondary | ICD-10-CM

## 2016-08-15 ENCOUNTER — Ambulatory Visit (INDEPENDENT_AMBULATORY_CARE_PROVIDER_SITE_OTHER): Payer: Medicare Other | Admitting: *Deleted

## 2016-08-15 DIAGNOSIS — I495 Sick sinus syndrome: Secondary | ICD-10-CM

## 2016-08-16 ENCOUNTER — Encounter (HOSPITAL_COMMUNITY): Payer: Self-pay | Admitting: *Deleted

## 2016-08-16 LAB — CUP PACEART REMOTE DEVICE CHECK
Battery Remaining Longevity: 94 mo
Battery Voltage: 3.01 V
Brady Statistic RA Percent Paced: 98.92 %
Brady Statistic RV Percent Paced: 0.06 %
Date Time Interrogation Session: 20180312143449
Implantable Lead Implant Date: 20160202
Implantable Lead Location: 753859
Implantable Lead Location: 753860
Implantable Lead Model: 5076
Implantable Pulse Generator Implant Date: 20160202
Lead Channel Impedance Value: 551 Ohm
Lead Channel Pacing Threshold Amplitude: 0.375 V
Lead Channel Pacing Threshold Pulse Width: 0.4 ms
Lead Channel Sensing Intrinsic Amplitude: 30.25 mV
Lead Channel Sensing Intrinsic Amplitude: 4 mV
Lead Channel Sensing Intrinsic Amplitude: 4 mV
Lead Channel Setting Pacing Amplitude: 2.5 V
Lead Channel Setting Sensing Sensitivity: 2.8 mV
MDC IDC LEAD IMPLANT DT: 20160202
MDC IDC MSMT LEADCHNL RA IMPEDANCE VALUE: 418 Ohm
MDC IDC MSMT LEADCHNL RA IMPEDANCE VALUE: 475 Ohm
MDC IDC MSMT LEADCHNL RV IMPEDANCE VALUE: 475 Ohm
MDC IDC MSMT LEADCHNL RV PACING THRESHOLD AMPLITUDE: 0.75 V
MDC IDC MSMT LEADCHNL RV PACING THRESHOLD PULSEWIDTH: 0.4 ms
MDC IDC MSMT LEADCHNL RV SENSING INTR AMPL: 30.25 mV
MDC IDC SET LEADCHNL RA PACING AMPLITUDE: 2 V
MDC IDC SET LEADCHNL RV PACING PULSEWIDTH: 0.4 ms
MDC IDC STAT BRADY AP VP PERCENT: 0.06 %
MDC IDC STAT BRADY AP VS PERCENT: 99.12 %
MDC IDC STAT BRADY AS VP PERCENT: 0 %
MDC IDC STAT BRADY AS VS PERCENT: 0.82 %

## 2016-08-16 NOTE — Progress Notes (Signed)
Remote pacemaker transmission.   

## 2016-08-17 ENCOUNTER — Encounter: Payer: Self-pay | Admitting: Cardiology

## 2016-08-19 ENCOUNTER — Ambulatory Visit (HOSPITAL_COMMUNITY): Payer: Medicare Other | Admitting: Anesthesiology

## 2016-08-19 ENCOUNTER — Ambulatory Visit (HOSPITAL_COMMUNITY)
Admission: RE | Admit: 2016-08-19 | Discharge: 2016-08-19 | Disposition: A | Discharge status: Home / Self Care | Payer: Medicare Other | Source: Ambulatory Visit | Attending: Gastroenterology | Admitting: Gastroenterology

## 2016-08-19 ENCOUNTER — Encounter (HOSPITAL_COMMUNITY): Payer: Self-pay

## 2016-08-19 ENCOUNTER — Encounter (HOSPITAL_COMMUNITY)
Admission: RE | Disposition: A | Discharge status: Home / Self Care | Payer: Self-pay | Source: Ambulatory Visit | Attending: Gastroenterology

## 2016-08-19 DIAGNOSIS — D124 Benign neoplasm of descending colon: Secondary | ICD-10-CM | POA: Insufficient documentation

## 2016-08-19 DIAGNOSIS — Z800 Family history of malignant neoplasm of digestive organs: Secondary | ICD-10-CM | POA: Insufficient documentation

## 2016-08-19 DIAGNOSIS — Z882 Allergy status to sulfonamides status: Secondary | ICD-10-CM | POA: Insufficient documentation

## 2016-08-19 DIAGNOSIS — E785 Hyperlipidemia, unspecified: Secondary | ICD-10-CM | POA: Insufficient documentation

## 2016-08-19 DIAGNOSIS — Z86010 Personal history of colonic polyps: Secondary | ICD-10-CM | POA: Diagnosis not present

## 2016-08-19 DIAGNOSIS — Z888 Allergy status to other drugs, medicaments and biological substances status: Secondary | ICD-10-CM | POA: Diagnosis not present

## 2016-08-19 DIAGNOSIS — K5730 Diverticulosis of large intestine without perforation or abscess without bleeding: Secondary | ICD-10-CM | POA: Insufficient documentation

## 2016-08-19 DIAGNOSIS — K219 Gastro-esophageal reflux disease without esophagitis: Secondary | ICD-10-CM | POA: Insufficient documentation

## 2016-08-19 DIAGNOSIS — F419 Anxiety disorder, unspecified: Secondary | ICD-10-CM | POA: Diagnosis not present

## 2016-08-19 DIAGNOSIS — E119 Type 2 diabetes mellitus without complications: Secondary | ICD-10-CM | POA: Insufficient documentation

## 2016-08-19 DIAGNOSIS — Z87891 Personal history of nicotine dependence: Secondary | ICD-10-CM | POA: Diagnosis not present

## 2016-08-19 DIAGNOSIS — K589 Irritable bowel syndrome without diarrhea: Secondary | ICD-10-CM | POA: Diagnosis not present

## 2016-08-19 DIAGNOSIS — Z950 Presence of cardiac pacemaker: Secondary | ICD-10-CM | POA: Diagnosis not present

## 2016-08-19 DIAGNOSIS — I10 Essential (primary) hypertension: Secondary | ICD-10-CM | POA: Diagnosis not present

## 2016-08-19 DIAGNOSIS — D125 Benign neoplasm of sigmoid colon: Secondary | ICD-10-CM | POA: Insufficient documentation

## 2016-08-19 DIAGNOSIS — M1990 Unspecified osteoarthritis, unspecified site: Secondary | ICD-10-CM | POA: Diagnosis not present

## 2016-08-19 DIAGNOSIS — K760 Fatty (change of) liver, not elsewhere classified: Secondary | ICD-10-CM | POA: Insufficient documentation

## 2016-08-19 DIAGNOSIS — J45909 Unspecified asthma, uncomplicated: Secondary | ICD-10-CM | POA: Insufficient documentation

## 2016-08-19 DIAGNOSIS — M797 Fibromyalgia: Secondary | ICD-10-CM | POA: Diagnosis not present

## 2016-08-19 DIAGNOSIS — G5752 Tarsal tunnel syndrome, left lower limb: Secondary | ICD-10-CM | POA: Insufficient documentation

## 2016-08-19 DIAGNOSIS — D122 Benign neoplasm of ascending colon: Secondary | ICD-10-CM | POA: Insufficient documentation

## 2016-08-19 DIAGNOSIS — Z886 Allergy status to analgesic agent status: Secondary | ICD-10-CM | POA: Insufficient documentation

## 2016-08-19 DIAGNOSIS — Z1211 Encounter for screening for malignant neoplasm of colon: Secondary | ICD-10-CM | POA: Insufficient documentation

## 2016-08-19 DIAGNOSIS — Z91040 Latex allergy status: Secondary | ICD-10-CM | POA: Insufficient documentation

## 2016-08-19 DIAGNOSIS — F329 Major depressive disorder, single episode, unspecified: Secondary | ICD-10-CM | POA: Insufficient documentation

## 2016-08-19 DIAGNOSIS — Z9102 Food additives allergy status: Secondary | ICD-10-CM | POA: Insufficient documentation

## 2016-08-19 HISTORY — DX: Fatty (change of) liver, not elsewhere classified: K76.0

## 2016-08-19 HISTORY — PX: COLONOSCOPY WITH PROPOFOL: SHX5780

## 2016-08-19 HISTORY — DX: Psoriasis, unspecified: L40.9

## 2016-08-19 LAB — GLUCOSE, CAPILLARY: GLUCOSE-CAPILLARY: 98 mg/dL (ref 65–99)

## 2016-08-19 SURGERY — COLONOSCOPY WITH PROPOFOL
Anesthesia: Monitor Anesthesia Care

## 2016-08-19 MED ORDER — SODIUM CHLORIDE 0.9 % IV SOLN: INTRAVENOUS | Status: DC

## 2016-08-19 MED ORDER — LACTATED RINGERS IV SOLN
INTRAVENOUS | Status: DC
  Administered 2016-08-19: 1000 mL via INTRAVENOUS

## 2016-08-19 MED ORDER — PROPOFOL 10 MG/ML IV BOLUS
INTRAVENOUS | Status: AC
  Filled 2016-08-19: qty 20

## 2016-08-19 MED ORDER — PROPOFOL 10 MG/ML IV BOLUS
INTRAVENOUS | Status: DC | PRN
  Administered 2016-08-19: 20 mg via INTRAVENOUS
  Administered 2016-08-19: 30 mg via INTRAVENOUS
  Administered 2016-08-19 (×6): 20 mg via INTRAVENOUS
  Administered 2016-08-19: 30 mg via INTRAVENOUS
  Administered 2016-08-19 (×17): 20 mg via INTRAVENOUS

## 2016-08-19 MED ORDER — PROPOFOL 10 MG/ML IV BOLUS
INTRAVENOUS | Status: AC
  Filled 2016-08-19: qty 40

## 2016-08-19 SURGICAL SUPPLY — 22 items

## 2016-08-19 NOTE — H&P (Signed)
[name] HPI: This is a 61 year old female with a change in her bowl habits in the recent past.  Currently she still has some intermittend diarrhea.  Her paternal grandfather and paternal cousin were diagnosed with colon cancer.  Past Medical History:  Diagnosis Date  . Allergy   . Anal fissure 02/2012  . Anxiety   . Arthritis   . Asthma   . Bursitis    right hip  . Complication of anesthesia     woke up during a colonscopy, difficult to wake up post-op sometimes , requires lots of blankets to stay warm post op  . Depression   . Diabetes mellitus without complication ([place])    type 2 diet controlled  . Fatty liver    sees dr Collene Mares for  . Fibromyalgia   . GERD (gastroesophageal reflux disease)   . Hepatitis A infection 1979  . Hyperlipidemia   . Hypertension   . Irritable bowel syndrome (IBS)   . 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 Psoriasis    left foot  . 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 Tarsal tunnel syndrome of left side     Past Surgical History:  Procedure Laterality Date  . ABDOMINAL HYSTERECTOMY  09/1994   partial  . APPENDECTOMY    . BILATERAL SALPINGOOPHORECTOMY Bilateral 04/2011  . CARDIAC CATHETERIZATION  10/08/2007  . CESAREAN SECTION     x 1  . CHOLECYSTECTOMY N/A 02/16/2015   Procedure: LAPAROSCOPIC CHOLECYSTECTOMY WITH INTRAOPERATIVE CHOLANGIOGRAM;  Surgeon: Johnathan Hausen, MD;  Location: [facility];  Service: General;  Laterality: N/A;  . CYSTOSCOPY W/ URETERAL STENT PLACEMENT  08/04/2009  . EXAMINATION UNDER ANESTHESIA  02/17/2012   Procedure: EXAM UNDER ANESTHESIA;  Surgeon: Pedro Earls, MD;  Location: [facility];  Service: General;  Laterality: N/A;  Exam under anesthesia with lateral internal sphincterotomy  . LAPAROSCOPIC LYSIS INTESTINAL ADHESIONS  08/04/2009  . PERMANENT PACEMAKER INSERTION N/A 07/08/2014   MDT MRI compatible dual chamber  pacemaker implanted by Dr Rayann Heman for symptomatic bradycardia  . SHOULDER ARTHROSCOPY W/ ROTATOR CUFF REPAIR Left 1999  . SMALL INTESTINE SURGERY    . SPHINCTEROTOMY  02/17/2012   Procedure: SPHINCTEROTOMY;  Surgeon: Pedro Earls, MD;  Location: [facility];  Service: General;  Laterality: N/A;    Family History  Problem Relation Age of Onset  . Cancer Mother     liver  . Diabetes Mother   . Hyperlipidemia Mother   . Hypertension Mother   . Stroke Mother   . Cancer Maternal Grandmother     breast  . Anesthesia problems Sister     hard to wake up post-op  . Heart disease Father   . Hyperlipidemia Brother   . HIV/AIDS Brother   . Cancer Maternal Grandfather     colon  . Diabetes Brother   . Cancer Brother     lung  . Cancer Paternal Grandfather     COLON    Social History:  reports that she quit smoking about 4 years ago. Her smoking use included Cigarettes. She has a 18.50 pack-year smoking history. She has never used smokeless tobacco. She reports that she does not drink alcohol or use drugs.  Allergies:  Allergies  Allergen Reactions  . Codeine Other (See Comments)    HALLUCINATIONS   . Propoxyphene N-Acetaminophen Other (See Comments)  HALLUCINATIONS  . Sulfonamide Derivatives Hives, Itching and Swelling  . Alka-Seltzer [Aspirin Effervescent] Other (See Comments)    "like she's fading away", jittery   . Norvasc [Amlodipine Besylate]     "passed out"  . Red Dye     "burning and tingling sensation"  . Singulair [Montelukast Sodium] Other (See Comments)    Increased depression, sadness  . Iohexol Nausea Only    JITTERY    . Latex Hives and Itching    Medications:  Scheduled:  Continuous: . sodium chloride    . lactated ringers 1,000 mL (08/19/16 0818)    Results for orders placed or performed during the hospital encounter of 08/19/16 (from the past 24 hour(s))  Glucose, capillary     Status: None   Collection Time: 08/19/16  8:13 AM   Result Value Ref Range   Glucose-Capillary 98 65 - 99 mg/dL     No results found.  ROS:  As stated above in the HPI otherwise negative.  Blood pressure 135/79, pulse 62, temperature 98 F (36.7 C), temperature source Oral, resp. rate 18, height 5' 7"  (1.702 m), weight 72.6 kg (160 lb), SpO2 100 %.    PE: Gen: NAD, Alert and Oriented HEENT:  [place]/AT, EOMI Neck: Supple, no LAD Lungs: CTA Bilaterally CV: RRR without M/G/R ABM: Soft, NTND, +BS Ext: No C/C/E  Assessment/Plan: 1) Personal history polyps. 2) FH of colon cancer. 3) Change in bowel habits.  Plan: 1) Colonoscopy with biopsies.  Aricela Bertagnolli D 08/19/2016, 8:31 AM

## 2016-08-19 NOTE — Op Note (Signed)
[facility] Patient Name: [name] Procedure Date: 08/19/2016 MRN: [id number] Attending MD: Carol Ada , MD Date of Birth: 08-Oct-1955 CSN: [id number] Age: 61 Admit Type: Outpatient Procedure:                Colonoscopy Indications:              High risk colon cancer surveillance: Personal                            history of colonic polyps Providers:                Carol Ada, MD, Kingsley Plan, RN, Alfonso Patten,                            Technician, Embarrass Alday CRNA, CRNA Referring MD:              Medicines:                Propofol per Anesthesia Complications:            No immediate complications. Estimated Blood Loss:     Estimated blood loss: none. Procedure:                Pre-Anesthesia Assessment:                           - Prior to the procedure, a History and Physical                            was performed, and patient medications and                            allergies were reviewed. The patient's tolerance of                            previous anesthesia was also reviewed. The risks                            and benefits of the procedure and the sedation                            options and risks were discussed with the patient.                            All questions were answered, and informed consent                            was obtained. Prior Anticoagulants: The patient has                            taken no previous anticoagulant or antiplatelet                            agents. ASA Grade Assessment: II - A patient with  mild systemic disease. After reviewing the risks                            and benefits, the patient was deemed in                            satisfactory condition to undergo the procedure.                           - Sedation was administered by an anesthesia                            professional. Deep sedation was attained.                           After obtaining informed  consent, the colonoscope                            was passed under direct vision. Throughout the                            procedure, the patient's blood pressure, pulse, and                            oxygen saturations were monitored continuously. The                            EC-[id number] ([id number]) scope was introduced through                            the anus and advanced to the the cecum, identified                            by appendiceal orifice and ileocecal valve. The                            colonoscopy was somewhat difficult due to                            significant looping. Successful completion of the                            procedure was aided by applying abdominal pressure.                            The patient tolerated the procedure well. The                            quality of the bowel preparation was good. The                            ileocecal valve, appendiceal orifice, and rectum  were photographed. Scope In: 8:36:44 AM Scope Out: 8:59:25 AM Scope Withdrawal Time: 0 hours 15 minutes 36 seconds  Total Procedure Duration: 0 hours 22 minutes 41 seconds  Findings:      Two sessile polyps were found in the descending colon and ascending       colon. The polyps were 1 to 2 mm in size. These polyps were removed with       a cold biopsy forceps. Resection and retrieval were complete.      A 1 mm polyp was found in the sigmoid colon. The polyp was sessile. The       polyp was removed with a cold snare. Resection was complete, but the       polyp tissue was not retrieved.      Normal mucosa was found in the entire colon. Biopsies for histology were       taken with a cold forceps from the entire colon for evaluation of       microscopic colitis.      Scattered small-mouthed diverticula were found in the sigmoid colon. Impression:               - Two 1 to 2 mm polyps in the descending colon and                            in the  ascending colon, removed with a cold biopsy                            forceps. Resected and retrieved.                           - One 1 mm polyp in the sigmoid colon, removed with                            a cold snare. Complete resection. Polyp tissue not                            retrieved.                           - Normal mucosa in the entire examined colon.                            Biopsied.                           - Diverticulosis in the sigmoid colon. Moderate Sedation:      N/A- Per Anesthesia Care Recommendation:           - Patient has a contact number available for                            emergencies. The signs and symptoms of potential                            delayed complications were discussed with the  patient. Return to normal activities tomorrow.                            Written discharge instructions were provided to the                            patient.                           - Resume previous diet.                           - Continue present medications.                           - Await pathology results.                           - Repeat colonoscopy in 5-10 years for surveillance. Procedure Code(s):        --- Professional ---                           [phone], Colonoscopy, flexible; with removal of                            tumor(s), polyp(s), or other lesion(s) by snare                            technique                           [id number], 61, Colonoscopy, flexible; with biopsy,                            single or multiple Diagnosis Code(s):        --- Professional ---                           D12.4, Benign neoplasm of descending colon                           Z86.010, Personal history of colonic polyps                           D12.2, Benign neoplasm of ascending colon                           D12.5, Benign neoplasm of sigmoid colon                           K57.30, Diverticulosis of large intestine without                             perforation or abscess without bleeding CPT copyright 2016 [employer]. All rights reserved. The codes documented in this report are preliminary and upon coder review may  be revised to meet current compliance requirements. Carol Ada, MD Carol Ada,  MD 08/19/2016 9:07:15 AM This report has been signed electronically. Number of Addenda: 0

## 2016-08-19 NOTE — Anesthesia Preprocedure Evaluation (Addendum)
Anesthesia Evaluation  Patient identified by MRN, date of birth, ID band Patient awake    Reviewed: Allergy & Precautions, NPO status , Patient's Chart, lab work & pertinent test results  Airway Mallampati: II  TM Distance: >3 FB Neck ROM: Full    Dental   Pulmonary asthma , former smoker,    breath sounds clear to auscultation       Cardiovascular hypertension, Pt. on medications + pacemaker  Rhythm:Regular Rate:Normal     Neuro/Psych Anxiety Depression negative neurological ROS     GI/Hepatic Neg liver ROS, GERD  ,  Endo/Other  diabetes, Type 2  Renal/GU negative Renal ROS     Musculoskeletal  (+) Arthritis , Fibromyalgia -  Abdominal   Peds  Hematology negative hematology ROS (+)   Anesthesia Other Findings   Reproductive/Obstetrics                            Anesthesia Physical Anesthesia Plan  ASA: II  Anesthesia Plan: MAC   Post-op Pain Management:    Induction: Intravenous  Airway Management Planned: Natural Airway and Simple Face Mask  Additional Equipment:   Intra-op Plan:   Post-operative Plan:   Informed Consent: I have reviewed the patients History and Physical, chart, labs and discussed the procedure including the risks, benefits and alternatives for the proposed anesthesia with the patient or authorized representative who has indicated his/her understanding and acceptance.     Plan Discussed with:   Anesthesia Plan Comments:         Anesthesia Quick Evaluation

## 2016-08-19 NOTE — Transfer of Care (Signed)
Immediate Anesthesia Transfer of Care Note  Patient: [name]  Procedure(s) Performed: Procedure(s): COLONOSCOPY WITH PROPOFOL (N/A)  Patient Location: PACU  Anesthesia Type:MAC  Level of Consciousness: sedated  Airway & Oxygen Therapy: Patient Spontanous Breathing and Patient connected to nasal cannula oxygen  Post-op Assessment: Report given to RN and Post -op Vital signs reviewed and stable  Post vital signs: Reviewed and stable  Last Vitals:  Vitals:   08/19/16 0800  BP: 135/79  Pulse: 62  Resp: 18  Temp: 36.7 C    Last Pain:  Vitals:   08/19/16 0800  TempSrc: Oral         Complications: No apparent anesthesia complications

## 2016-08-19 NOTE — Anesthesia Postprocedure Evaluation (Signed)
Anesthesia Post Note  Patient: [name]  Procedure(s) Performed: Procedure(s) (LRB): COLONOSCOPY WITH PROPOFOL (N/A)  Patient location during evaluation: PACU Anesthesia Type: MAC Level of consciousness: awake and alert Pain management: pain level controlled Vital Signs Assessment: post-procedure vital signs reviewed and stable Respiratory status: spontaneous breathing, nonlabored ventilation, respiratory function stable and patient connected to nasal cannula oxygen Cardiovascular status: stable and blood pressure returned to baseline Anesthetic complications: no       Last Vitals:  Vitals:   08/19/16 0930 08/19/16 0935  BP: (!) 123/93 125/70  Pulse: 66 (!) 59  Resp: 14 13  Temp:      Last Pain:  Vitals:   08/19/16 0905  TempSrc: Leonard Schwartz

## 2016-08-19 NOTE — Discharge Instructions (Signed)

## 2016-08-22 ENCOUNTER — Encounter (HOSPITAL_COMMUNITY): Payer: Self-pay | Admitting: Gastroenterology

## 2016-09-01 ENCOUNTER — Encounter: Payer: Self-pay | Admitting: Cardiology

## 2016-09-13 ENCOUNTER — Encounter: Payer: Self-pay | Admitting: Physician Assistant

## 2016-09-13 ENCOUNTER — Ambulatory Visit (INDEPENDENT_AMBULATORY_CARE_PROVIDER_SITE_OTHER): Payer: Medicare Other | Admitting: Physician Assistant

## 2016-09-13 ENCOUNTER — Other Ambulatory Visit: Payer: Self-pay | Admitting: Family Medicine

## 2016-09-13 VITALS — BP 101/63 | HR 87 | Temp 97.4°F | Resp 16 | Ht 67.0 in | Wt 162.8 lb

## 2016-09-13 DIAGNOSIS — F329 Major depressive disorder, single episode, unspecified: Secondary | ICD-10-CM

## 2016-09-13 DIAGNOSIS — F32A Depression, unspecified: Secondary | ICD-10-CM

## 2016-09-13 DIAGNOSIS — J310 Chronic rhinitis: Secondary | ICD-10-CM

## 2016-09-13 DIAGNOSIS — M797 Fibromyalgia: Secondary | ICD-10-CM

## 2016-09-13 DIAGNOSIS — E785 Hyperlipidemia, unspecified: Secondary | ICD-10-CM

## 2016-09-13 DIAGNOSIS — H9202 Otalgia, left ear: Secondary | ICD-10-CM | POA: Diagnosis not present

## 2016-09-13 DIAGNOSIS — E119 Type 2 diabetes mellitus without complications: Secondary | ICD-10-CM

## 2016-09-13 DIAGNOSIS — E559 Vitamin D deficiency, unspecified: Secondary | ICD-10-CM

## 2016-09-13 DIAGNOSIS — J302 Other seasonal allergic rhinitis: Secondary | ICD-10-CM | POA: Diagnosis not present

## 2016-09-13 DIAGNOSIS — F419 Anxiety disorder, unspecified: Secondary | ICD-10-CM

## 2016-09-13 DIAGNOSIS — I10 Essential (primary) hypertension: Secondary | ICD-10-CM | POA: Diagnosis not present

## 2016-09-13 MED ORDER — DICYCLOMINE HCL 10 MG PO CAPS: 10.0000 mg | ORAL_CAPSULE | Freq: Three times a day (TID) | ORAL | 1 refills | Status: DC

## 2016-09-13 MED ORDER — VITAMIN D 1000 UNITS PO TABS: 1000.0000 [IU] | ORAL_TABLET | Freq: Every day | ORAL | 3 refills | Status: DC

## 2016-09-13 MED ORDER — FLUTICASONE PROPIONATE 50 MCG/ACT NA SUSP: NASAL | 2 refills | Status: DC

## 2016-09-13 MED ORDER — FLUTICASONE PROPIONATE HFA 110 MCG/ACT IN AERO: 1.0000 | INHALATION_SPRAY | Freq: Two times a day (BID) | RESPIRATORY_TRACT | 12 refills | Status: DC | PRN

## 2016-09-13 MED ORDER — OXYCODONE-ACETAMINOPHEN 10-325 MG PO TABS: 1.0000 | ORAL_TABLET | Freq: Two times a day (BID) | ORAL | 0 refills | Status: DC | PRN

## 2016-09-13 MED ORDER — CETIRIZINE HCL 10 MG PO TABS: 10.0000 mg | ORAL_TABLET | Freq: Every day | ORAL | 11 refills | Status: DC

## 2016-09-13 MED ORDER — AZELASTINE HCL 0.1 % NA SOLN: 2.0000 | Freq: Two times a day (BID) | NASAL | 12 refills | Status: DC

## 2016-09-13 NOTE — Progress Notes (Signed)
THIS NOTE IS USED FOR EDUCATIONAL PURPOSES ONLY!!!   Name: [name]  DOB: Sep 26, 1955  Age: 61 y.o. Sex: female  CC:  Chief Complaint  Patient presents with  . Follow-up    5 month  . Ear Pain    x3 wks  . GI Problem    2-3wks  . Medication Refill    percocet, q var inhaler    PCP: [name], PA-C  HPI: Patient reports she has "not been feeling well", weakness, IBS with diarrhea, bloating, stomach cramping, bloating, and ear pain x2 weeks and is here for depression f/u.   Patient reports her symptoms began after her colonoscopy on 08/19/16.   Patient reports ear pain in her left ear and left sinus congestion. She denies tinnitus. Denies changes in hearing. Reports pain with pulling on the ear. Denies drainage from the ear. She reports like her left side of her face is congested. She has a history of seasonal allergies. She reports itchy eyes, rhinorrhea, sneezing, and coughing.   Patient reports she has been having diarrhea 1-2 times daily. Patient reports she has been getting hemorrhoids associated with the diarrhea. Denies hematochezia. Patient reports her abdominal pain gets better after having a BM and laying down after. She has been taking imodium which she thinks is helping. Patient reports she has taken a black and green capsule in the past which helped her similar symptoms "years ago". Last bout of diarrhea was this morning. She reports that she has been prescribed Lomotil from Dr. Collene Mares and reports that didn't help.   Patient reports a decreased in appetite. Patient reports a decrease in energy. Patient reports not feeling like she wants to do anything. Patient reports that she gets 4-5 hours of sleep at night and is sleeping throughout the day. Patient reports she has tried [place] for sleep but was forgetting things so she d/c that. She is now taking Xanex 1-2 times monthly if she feels like she really needs to go to sleep. Denies issues with concentration. Increased  irritability.   ROS:  Constitutional: Negative for activity change and unexpected weight change.  HENT: Negative for dental problem, hearing loss, mouth sores, postnasal drip, sore throat, tinnitus and trouble swallowing.   Eyes: Negative for photophobia, pain, redness and visual disturbance.  Respiratory: Negative for cough, chest tightness and shortness of breath.   Cardiovascular: Negative for chest pain, palpitations and leg swelling.  Gastrointestinal: Negative for blood in stool, nausea and vomiting.  Genitourinary: Negative for dysuria, frequency, hematuria and urgency.  Musculoskeletal: Negative for arthralgias, gait problem, myalgias and neck stiffness.  Skin: Negative for rash.  Neurological: Negative for dizziness, speech difficulty, weakness, light-headedness, numbness and headaches.  Hematological: Negative for adenopathy.  Psychiatric/Behavioral: Negative for confusion and sleep disturbance. The patient is not nervous/anxious.    PMH:  Patient Active Problem List   Diagnosis Date Noted  . Right knee pain 07/04/2016  . Pain in right ankle and joints of right foot 07/04/2016  . Psoriasis 03/10/2015  . Cholecystitis 02/16/2015  . Gallstones 02/15/2015  . Right foot pain 11/13/2014  . Ethmoid sinusitis 11/13/2014  . Abdominal pain, epigastric 11/13/2014  . Vitamin D deficiency 11/13/2014  . 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 Sick sinus syndrome ([place]) 07/08/2014  . Chest pain at rest 07/05/2014  . Chronic rhinitis 10/22/2013  . Fatty liver 10/22/2013  . Interstitial cystitis 10/22/2013  . Hip pain, right 10/22/2013  . Chronic insomnia 09/25/2012  . HTN (hypertension) 09/25/2012  . Hyperlipidemia  09/25/2012  . Diabetes mellitus type 2, controlled, without complications ([place]) 36/14/4315  . GERD 12/04/2008  . History of colonic polyps 12/04/2008  . CONSTIPATION 10/21/2008  . Fibromyalgia 10/21/2008  . LACTOSE INTOLERANCE 11/20/2007  . CHEST PAIN 11/20/2007  . Anxiety  and depression 08/02/2007  . Osteoporosis 08/02/2007  . Diverticulosis of large intestine 11/16/2006  . EXTERNAL HEMORRHOIDS 05/12/2005    Allergies:  Allergies  Allergen Reactions  . Codeine Other (See Comments)    HALLUCINATIONS   . Propoxyphene N-Acetaminophen Other (See Comments)    HALLUCINATIONS  . Sulfonamide Derivatives Hives, Itching and Swelling  . Alka-Seltzer [Aspirin Effervescent] Other (See Comments)    "like she's fading away", jittery   . Norvasc [Amlodipine Besylate]     "passed out"  . Red Dye     "burning and tingling sensation"  . Singulair [Montelukast Sodium] Other (See Comments)    Increased depression, sadness  . Iohexol Nausea Only    JITTERY    . Latex Hives and Itching    Medications:  Current Outpatient Prescriptions on File Prior to Visit  Medication Sig Dispense Refill  . albuterol (PROVENTIL HFA;VENTOLIN HFA) 108 (90 BASE) MCG/ACT inhaler Inhale 2 puffs into the lungs every 6 (six) hours as needed for wheezing. 18 g 2  . Azilsartan Medoxomil (EDARBI) 40 MG TABS Take 40 mg by mouth daily.     . blood glucose meter kit and supplies KIT Dispense based on patient and insurance preference. Use daily as directed. ICD-10: E11.9 1 each 0  . calcipotriene (DOVONOX) 0.005 % cream Apply 1 application topically 4 (four) times daily as needed (psoriasis).    . cholecalciferol (VITAMIN D) 1000 units tablet Take 1 tablet (1,000 Units total) by mouth daily. 90 tablet 3  . cyclobenzaprine (FLEXERIL) 10 MG tablet Take 5-10 mg by mouth daily as needed for muscle spasms.     . Desoximetasone (TOPICORT) 0.25 % ointment Apply 1 application topically 2 (two) times daily as needed (for psoriasis). (Patient taking differently: Apply 1 application topically daily as needed (for psoriasis). ) 30 g 6  . diazepam (VALIUM) 5 MG tablet Take 1 tablet (5 mg total) by mouth daily as needed for anxiety or muscle spasms. 30 tablet 0  . diphenoxylate-atropine (LOMOTIL) 2.5-0.025 MG  tablet Take 2 tablets by mouth 2 (two) times daily as needed for diarrhea or loose stools.     . docusate sodium (COLACE) 100 MG capsule TAKE 1 CAPSULE (100 MG TOTAL) BY MOUTH DAILY AS NEEDED FOR MILD CONSTIPATION. 30 capsule 0  . escitalopram (LEXAPRO) 20 MG tablet Take 1 tablet (20 mg total) by mouth daily. 90 tablet 3  . ezetimibe (ZETIA) 10 MG tablet Take 10 mg by mouth every evening.     . fluticasone (FLONASE) 50 MCG/ACT nasal spray USE 2 SPRAYS INTO EACH NOSTRIL EVERY DAY (Patient taking differently: USE 2 SPRAYS INTO EACH NOSTRIL TWICE DAILY) 16 g 2  . fluticasone (FLOVENT HFA) 110 MCG/ACT inhaler Inhale 1-2 puffs into the lungs 2 (two) times daily. (Patient taking differently: Inhale 1-2 puffs into the lungs 2 (two) times daily as needed (shortness of breath). ) 3 Inhaler 3  . ipratropium (ATROVENT) 0.03 % nasal spray USE 2 SPRAYS INTO THE NOSE EVERY 12 (TWELVE) HOURS. 30 mL 9  . loteprednol (LOTEMAX) 0.5 % ophthalmic suspension Place 1 drop into both eyes 2 (two) times daily as needed (itching, swelling).     . meloxicam (MOBIC) 15 MG tablet TAKE 1 TABLET BY MOUTH  EVERY DAY (Patient taking differently: TAKE 7.5MG - 15MG BY MOUTH AS NEEDED FOR PAIN) 30 tablet 3  . montelukast (SINGULAIR) 10 MG tablet Take 10 mg by mouth daily.  2  . Multiple Vitamin (MULTIVITAMIN) tablet Take 1 tablet by mouth daily.    Marland Kitchen NITROSTAT 0.4 MG SL tablet Place 0.4 mg under the tongue every 5 (five) minutes as needed for chest pain. For chest pain.    Marland Kitchen oxyCODONE-acetaminophen (PERCOCET) 10-325 MG tablet Take 1.5 tablets by mouth 2 (two) times daily. (Patient taking differently: Take 1-1.5 tablets by mouth 2 (two) times daily as needed for pain. ) 90 tablet 0  . PAZEO 0.7 % SOLN Place 1 drop into both eyes daily as needed for allergies.    . Probiotic Product (PROBIOTIC DAILY) CAPS Take 1 capsule by mouth 2 (two) times a week.    Marland Kitchen PROCTOFOAM HC rectal foam Place 1 application rectally daily as needed for  hemorrhoids.    . ranitidine (ZANTAC) 150 MG tablet Take 150 mg by mouth as needed for heartburn.     . zolpidem (AMBIEN) 10 MG tablet Take 5-10 mg by mouth at bedtime as needed for sleep.     Marland Kitchen amoxicillin-clavulanate (AUGMENTIN) 875-125 MG tablet Take 1 tablet by mouth 2 (two) times daily.     No current facility-administered medications on file prior to visit.     PE:  GS: WDWN female laying down on the exam table.  Vitals: BP 101/63   Pulse 87   Temp 97.4 F (36.3 C) (Oral)   Resp 16   Ht _0  (1.702 m)   Wt 162 lb 12.8 oz (73.8 kg)   SpO2 98%   BMI 25.50 kg/m  HEENT: Normocephalic, atruamatic. PEARRL. No cervical lymphadenopathy. No thyroid nodules, normal size, and equal bilaterally. Ears: Bilateral ears without erythema, TM clear with good cone of light. Negative tug test. TM non-bulging. Nose: Patent, without erythema or discharge. Mouth/Throat: Moist mucous membranes. No erythema. Tonsils without erythema or tonsillar exudate.  Cardiovascular: RRR. No S3 or S4. No murmurs, rubs, or gallops. Pulses 2+ and equal bilateral in the upper and lower extremities. No pitting edema. No varicosities, clubbing, or cyanosis.  Pulm: CTA bilaterally. No expiratory muscle use while breathing.  GI: +BS. Non-distended. Generalized abdominal ttp. No rigidity or guarding. No rebound tenderness.  Neuro: CN 2-12 grossly intact.  Psych: A&O x 4. Mood and affect appropriate for situation.  Skin: Warm and dry. No rashes or excoriations on exposed skin.   A&P:  1. Controlled type 2 diabetes mellitus without complication, without long-term current use of insulin ([place]) - controlled Plan: HM DIABETES EYE EXAM, Comprehensive metabolic panel, Hemoglobin A1c  2. Essential hypertension - controlled Plan: Comprehensive metabolic panel, CBC with Differential/Platelet  3. Hyperlipidemia, unspecified hyperlipidemia type - controlled. Rechecked today.   Plan: Comprehensive metabolic panel, Lipid panel  4.  Anxiety and depression  5. Fibromyalgia - controlled Plan: oxyCODONE-acetaminophen (PERCOCET) 10-325 MG tablet  6. Ear pain, left - most likely due to seasonal allergies.   7. Seasonal allergic rhinitis, unspecified chronicity, unspecified trigger - uncontrolled.  Plan: azelastine (ASTELIN) 0.1 % nasal spray, cetirizine (ZYRTEC) 10 MG tablet, fluticasone (FLOVENT HFA) 110 MCG/ACT inhaler, DISCONTINUED: fluticasone (FLOVENT HFA) 110 MCG/ACT inhaler  8. Chronic rhinitis, unspecified type - Plan: fluticasone (FLONASE) 50 MCG/ACT nasal spray  9. Vitamin D deficiency - Plan: cholecalciferol (VITAMIN D) 1000 units tablet    Respectfully,  Delilah Shan, PA-S2

## 2016-09-13 NOTE — Telephone Encounter (Signed)
Pt calling to let [name] know the name of medication that they discuss DICYCLOMINE 10 MG

## 2016-09-13 NOTE — Patient Instructions (Addendum)
Stop the Atrovent nasal spray. Use the Azelastine spray 2 times in each nostril 5 minutes before using your Flonase 2 times in each nostril every day.  Check the bottle of the green and black capsules that worked before for your stomach. Let me now what it is and I can write you a new prescription.      IF you received an x-ray today, you will receive an invoice from [facility]. Please contact [facility] at [phone] with questions or concerns regarding your invoice.   IF you received labwork today, you will receive an invoice from [facility]. Please contact [facility] at [phone] with questions or concerns regarding your invoice.   Our billing staff will not be able to assist you with questions regarding bills from these companies.  You will be contacted with the lab results as soon as they are available. The fastest way to get your results is to activate your My Chart account. Instructions are located on the last page of this paperwork. If you have not heard from [place] regarding the results in 2 weeks, please contact this office.

## 2016-09-13 NOTE — Progress Notes (Signed)
Patient ID: [name], female    DOB: [birth year], 61 y.o.   MRN: [id number]  PCP: [name], PA-C  Chief Complaint  Patient presents with  . Follow-up    5 month  . Ear Pain    x3 wks  . GI Problem    2-3wks  . Medication Refill    percocet, q var inhaler    Subjective:   Presents for evaluation of 3 weeks of ear pain, abdominal cramping/diarrhea and refill of oxycodone and steroid inhaler.  Lomotil, prescribed by Dr. Collene Mares, isn't helping. Recalls a green and black capsule I prescribed her "years ago," that helped with similar symptoms. She has the empty bottle at home, but doesn't recall the name and didn't bring the bottle today. No hematochezia or melena.  Continues to hurt "everywhere." Lots of triggers exacerbate her fibromyalgia pain, including anxiety, depression, weather changes, loneliness, poor sleep, too much exercise.  Weather changes have contributed recently.  Congestion and ear pain on the LEFT only. Worried that she has "sinus" again. No dizziness. No roaring in the ear. No drainage from the ear.    Review of Systems As above.    Patient Active Problem List   Diagnosis Date Noted  . Right knee pain 07/04/2016  . Pain in right ankle and joints of right foot 07/04/2016  . Psoriasis 03/10/2015  . Cholecystitis 02/16/2015  . Gallstones 02/15/2015  . Right foot pain 11/13/2014  . Ethmoid sinusitis 11/13/2014  . Abdominal pain, epigastric 11/13/2014  . Vitamin D deficiency 11/13/2014  . 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 Sick sinus syndrome ([place]) 07/08/2014  . Chest pain at rest 07/05/2014  . Chronic rhinitis 10/22/2013  . Fatty liver 10/22/2013  . Interstitial cystitis 10/22/2013  . Hip pain, right 10/22/2013  . Chronic insomnia 09/25/2012  . HTN (hypertension) 09/25/2012  . Hyperlipidemia 09/25/2012  . Diabetes mellitus type 2, controlled, without complications ([place]) 85/27/7824  . GERD 12/04/2008  . History of colonic polyps  12/04/2008  . CONSTIPATION 10/21/2008  . Fibromyalgia 10/21/2008  . LACTOSE INTOLERANCE 11/20/2007  . CHEST PAIN 11/20/2007  . Anxiety and depression 08/02/2007  . Osteoporosis 08/02/2007  . Diverticulosis of large intestine 11/16/2006  . EXTERNAL HEMORRHOIDS 05/12/2005     Prior to Admission medications   Medication Sig Start Date End Date Taking? Authorizing Provider  albuterol (PROVENTIL HFA;VENTOLIN HFA) 108 (90 BASE) MCG/ACT inhaler Inhale 2 puffs into the lungs every 6 (six) hours as needed for wheezing. 08/15/13  Yes Aul Mangieri, PA-C  Azilsartan Medoxomil (EDARBI) 40 MG TABS Take 40 mg by mouth daily.    Yes Historical Provider, MD  blood glucose meter kit and supplies KIT Dispense based on patient and insurance preference. Use daily as directed. ICD-10: E11.9 06/09/15  Yes Dallie Patton, PA-C  calcipotriene (DOVONOX) 0.005 % cream Apply 1 application topically 4 (four) times daily as needed (psoriasis).   Yes Historical Provider, MD  cholecalciferol (VITAMIN D) 1000 units tablet Take 1 tablet (1,000 Units total) by mouth daily. 10/06/15  Yes Kelsye Loomer, PA-C  cyclobenzaprine (FLEXERIL) 10 MG tablet Take 5-10 mg by mouth daily as needed for muscle spasms.    Yes Historical Provider, MD  Desoximetasone (TOPICORT) 0.25 % ointment Apply 1 application topically 2 (two) times daily as needed (for psoriasis). Patient taking differently: Apply 1 application topically daily as needed (for psoriasis).  11/13/14  Yes Cheng Dec, PA-C  diazepam (VALIUM) 5 MG tablet Take 1 tablet (5 mg total) by mouth  daily as needed for anxiety or muscle spasms. 11/26/15  Yes Jermon Chalfant, PA-C  diphenoxylate-atropine (LOMOTIL) 2.5-0.025 MG tablet Take 2 tablets by mouth 2 (two) times daily as needed for diarrhea or loose stools.    Yes Historical Provider, MD  docusate sodium (COLACE) 100 MG capsule TAKE 1 CAPSULE (100 MG TOTAL) BY MOUTH DAILY AS NEEDED FOR MILD CONSTIPATION. 10/23/14  Yes Taelynn Mcelhannon,  PA-C  escitalopram (LEXAPRO) 20 MG tablet Take 1 tablet (20 mg total) by mouth daily. 10/06/15  Yes Azula Zappia, PA-C  ezetimibe (ZETIA) 10 MG tablet Take 10 mg by mouth every evening.    Yes Historical Provider, MD  fluticasone (FLONASE) 50 MCG/ACT nasal spray USE 2 SPRAYS INTO EACH NOSTRIL EVERY DAY Patient taking differently: USE 2 SPRAYS INTO EACH NOSTRIL TWICE DAILY 08/20/15  Yes Mancel Bale, PA-C  fluticasone (FLOVENT HFA) 110 MCG/ACT inhaler Inhale 1-2 puffs into the lungs 2 (two) times daily. Patient taking differently: Inhale 1-2 puffs into the lungs 2 (two) times daily as needed (shortness of breath).  05/12/16  Yes Markell Schrier, PA-C  ipratropium (ATROVENT) 0.03 % nasal spray USE 2 SPRAYS INTO THE NOSE EVERY 12 (TWELVE) HOURS. 03/10/15  Yes Odel Schmid, PA-C  loteprednol (LOTEMAX) 0.5 % ophthalmic suspension Place 1 drop into both eyes 2 (two) times daily as needed (itching, swelling).    Yes Historical Provider, MD  meloxicam (MOBIC) 15 MG tablet TAKE 1 TABLET BY MOUTH EVERY DAY Patient taking differently: TAKE 7.5MG - 15MG BY MOUTH AS NEEDED FOR PAIN 02/24/16  Yes Casyn Becvar, PA-C  montelukast (SINGULAIR) 10 MG tablet Take 10 mg by mouth daily. 08/04/16  Yes Historical Provider, MD  Multiple Vitamin (MULTIVITAMIN) tablet Take 1 tablet by mouth daily.   Yes Historical Provider, MD  NITROSTAT 0.4 MG SL tablet Place 0.4 mg under the tongue every 5 (five) minutes as needed for chest pain. For chest pain. 11/24/11  Yes Historical Provider, MD  oxyCODONE-acetaminophen (PERCOCET) 10-325 MG tablet Take 1.5 tablets by mouth 2 (two) times daily. Patient taking differently: Take 1-1.5 tablets by mouth 2 (two) times daily as needed for pain.  08/08/16  Yes Tereasa Coop, PA-C  PAZEO 0.7 % SOLN Place 1 drop into both eyes daily as needed for allergies. 05/27/16  Yes Historical Provider, MD  Probiotic Product (PROBIOTIC DAILY) CAPS Take 1 capsule by mouth 2 (two) times a week.   Yes Historical  Provider, MD  PROCTOFOAM [facility] rectal foam Place 1 application rectally daily as needed for hemorrhoids. 05/11/16  Yes Historical Provider, MD  ranitidine (ZANTAC) 150 MG tablet Take 150 mg by mouth as needed for heartburn.    Yes Historical Provider, MD  zolpidem (AMBIEN) 10 MG tablet Take 5-10 mg by mouth at bedtime as needed for sleep.  09/28/13  Yes Historical Provider, MD     Allergies  Allergen Reactions  . Codeine Other (See Comments)    HALLUCINATIONS   . Propoxyphene N-Acetaminophen Other (See Comments)    HALLUCINATIONS  . Sulfonamide Derivatives Hives, Itching and Swelling  . Alka-Seltzer [Aspirin Effervescent] Other (See Comments)    "like she's fading away", jittery   . Norvasc [Amlodipine Besylate]     "passed out"  . Red Dye     "burning and tingling sensation"  . Singulair [Montelukast Sodium] Other (See Comments)    Increased depression, sadness  . Iohexol Nausea Only    JITTERY    . Latex Hives and Itching  Objective:  Physical Exam  Constitutional: She is oriented to person, place, and time. She appears well-developed and well-nourished. She is active and cooperative. No distress.  BP 101/63   Pulse 87   Temp 97.4 F (36.3 C) (Oral)   Resp 16   Ht _0  (1.702 m)   Wt 162 lb 12.8 oz (73.8 kg)   SpO2 98%   BMI 25.50 kg/m   HENT:  Head: Normocephalic and atraumatic.  Right Ear: Hearing normal.  Left Ear: Hearing normal.  Eyes: Conjunctivae are normal. No scleral icterus.  Neck: Normal range of motion. Neck supple. No thyromegaly present.  Cardiovascular: Normal rate, regular rhythm and normal heart sounds.   Pulses:      Radial pulses are 2+ on the right side, and 2+ on the left side.  Pulmonary/Chest: Effort normal and breath sounds normal.  Lymphadenopathy:       Head (right side): No tonsillar, no preauricular, no posterior auricular and no occipital adenopathy present.       Head (left side): No tonsillar, no preauricular, no posterior  auricular and no occipital adenopathy present.    She has no cervical adenopathy.       Right: No supraclavicular adenopathy present.       Left: No supraclavicular adenopathy present.  Neurological: She is alert and oriented to person, place, and time. No sensory deficit.  Skin: Skin is warm, dry and intact. No rash noted. No cyanosis or erythema. Nails show no clubbing.  Psychiatric: Her speech is normal and behavior is normal. Judgment normal. Her mood appears anxious. Her affect is not angry, not blunt, not labile and not inappropriate. Thought content is paranoid. Thought content is not delusional. Cognition and memory are normal. She exhibits a depressed mood. She expresses no homicidal and no suicidal ideation. She expresses no suicidal plans and no homicidal plans.           Assessment & Plan:   Problem List Items Addressed This Visit    Anxiety and depression (Chronic)    Stable, but not particularly well controlled. She remains unwilling to try other treatment. Continue with current dose of escitalopram and counseling.      Diabetes mellitus type 2, controlled, without complications ([place]) - Primary (Chronic)    Has been well controlled with lifestyle modifications.      Relevant Orders   HM DIABETES EYE EXAM (Completed)   Comprehensive metabolic panel (Completed)   Hemoglobin A1c (Completed)   HTN (hypertension) (Chronic)    Controlled.      Relevant Orders   Comprehensive metabolic panel (Completed)   CBC with Differential/Platelet (Completed)   Hyperlipidemia (Chronic)    Await labs. Adjust regimen as indicated by results.       Relevant Orders   Comprehensive metabolic panel (Completed)   Lipid panel (Completed)   Fibromyalgia    I believe that her pain would be lessened by improved control of her anxiety and depression, but she is not willing to increase treatment.      Relevant Medications   oxyCODONE-acetaminophen (PERCOCET) 10-325 MG tablet   Chronic  rhinitis    Continue Flonase and montelukast. Try changing from Atrovent to azelastine nasal spray.      Relevant Medications   fluticasone (FLONASE) 50 MCG/ACT nasal spray   Vitamin D deficiency    Continue vitamin D supplementation.      Relevant Medications   cholecalciferol (VITAMIN D) 1000 units tablet    Other Visit Diagnoses  Ear pain, left       Seasonal allergic rhinitis, unspecified chronicity, unspecified trigger       Relevant Medications   azelastine (ASTELIN) 0.1 % nasal spray   cetirizine (ZYRTEC) 10 MG tablet   fluticasone (FLOVENT HFA) 110 MCG/ACT inhaler       Return in about 3 months (around 12/13/2016) for re-evaluation.   Fara Chute, PA-C [facility]

## 2016-09-13 NOTE — Telephone Encounter (Signed)
Meds ordered this encounter  Medications  . dicyclomine (BENTYL) 10 MG capsule    Sig: Take 1 capsule (10 mg total) by mouth 4 (four) times daily -  before meals and at bedtime.    Dispense:  30 capsule    Refill:  1    Order Specific Question:   Supervising Provider    Answer:   Brigitte Pulse, EVA N [[id number]]

## 2016-09-14 LAB — LIPID PANEL
CHOL/HDL RATIO: 5 ratio — AB (ref 0.0–4.4)
Cholesterol, Total: 195 mg/dL (ref 100–199)
HDL: 39 mg/dL — ABNORMAL LOW (ref >39)
LDL Calculated: 141 mg/dL — ABNORMAL HIGH (ref 0–99)
Triglycerides: 74 mg/dL (ref 0–149)
VLDL Cholesterol Cal: 15 mg/dL (ref 5–40)

## 2016-09-14 LAB — COMPREHENSIVE METABOLIC PANEL
ALK PHOS: 61 IU/L (ref 39–117)
ALT: 40 IU/L — ABNORMAL HIGH (ref 0–32)
AST: 24 IU/L (ref 0–40)
Albumin/Globulin Ratio: 1.7 (ref 1.2–2.2)
Albumin: 4.8 g/dL (ref 3.6–4.8)
BUN/Creatinine Ratio: 14 (ref 12–28)
BUN: 10 mg/dL (ref 8–27)
Bilirubin Total: 0.5 mg/dL (ref 0.0–1.2)
CHLORIDE: 99 mmol/L (ref 96–106)
CO2: 23 mmol/L (ref 18–29)
Calcium: 9.5 mg/dL (ref 8.7–10.3)
Creatinine, Ser: 0.74 mg/dL (ref 0.57–1.00)
GFR calc Af Amer: 101 mL/min/{1.73_m2} (ref >59)
GFR calc non Af Amer: 88 mL/min/{1.73_m2} (ref >59)
GLUCOSE: 103 mg/dL — AB (ref 65–99)
Globulin, Total: 2.9 g/dL (ref 1.5–4.5)
Potassium: 4.6 mmol/L (ref 3.5–5.2)
SODIUM: 140 mmol/L (ref 134–144)
Total Protein: 7.7 g/dL (ref 6.0–8.5)

## 2016-09-14 LAB — HEMOGLOBIN A1C
ESTIMATED AVERAGE GLUCOSE: 123 mg/dL
HEMOGLOBIN A1C: 5.9 % — AB (ref 4.8–5.6)

## 2016-09-14 LAB — CBC WITH DIFFERENTIAL/PLATELET
Basophils Absolute: 0 10*3/uL (ref 0.0–0.2)
Basos: 0 %
EOS (ABSOLUTE): 0 10*3/uL (ref 0.0–0.4)
EOS: 1 %
Hematocrit: 41.5 % (ref 34.0–46.6)
Hemoglobin: 13.6 g/dL (ref 11.1–15.9)
IMMATURE GRANULOCYTES: 0 %
Immature Grans (Abs): 0 10*3/uL (ref 0.0–0.1)
Lymphocytes Absolute: 2.5 10*3/uL (ref 0.7–3.1)
Lymphs: 42 %
MCH: 27.2 pg (ref 26.6–33.0)
MCHC: 32.8 g/dL (ref 31.5–35.7)
MCV: 83 fL (ref 79–97)
MONOCYTES: 6 %
Monocytes Absolute: 0.4 10*3/uL (ref 0.1–0.9)
NEUTROS PCT: 51 %
Neutrophils Absolute: 3.1 10*3/uL (ref 1.4–7.0)
PLATELETS: 336 10*3/uL (ref 150–379)
RBC: 5 x10E6/uL (ref 3.77–5.28)
RDW: 13.8 % (ref 12.3–15.4)
WBC: 6 10*3/uL (ref 3.4–10.8)

## 2016-09-19 ENCOUNTER — Encounter: Payer: Self-pay | Admitting: Physician Assistant

## 2016-09-26 MED ORDER — CIPROFLOXACIN-HYDROCORTISONE 0.2-1 % OT SUSP: 3.0000 [drp] | Freq: Two times a day (BID) | OTIC | 0 refills | Status: AC

## 2016-09-28 ENCOUNTER — Other Ambulatory Visit: Payer: Self-pay | Admitting: Physician Assistant

## 2016-09-28 DIAGNOSIS — J45909 Unspecified asthma, uncomplicated: Secondary | ICD-10-CM

## 2016-09-29 ENCOUNTER — Other Ambulatory Visit: Payer: Self-pay | Admitting: Physician Assistant

## 2016-09-29 DIAGNOSIS — J45909 Unspecified asthma, uncomplicated: Secondary | ICD-10-CM

## 2016-09-30 NOTE — Assessment & Plan Note (Signed)
Controlled.  

## 2016-09-30 NOTE — Assessment & Plan Note (Signed)
Has been well controlled with lifestyle modifications.

## 2016-09-30 NOTE — Assessment & Plan Note (Signed)
Continue vitamin D supplementation.

## 2016-09-30 NOTE — Assessment & Plan Note (Signed)
Continue Flonase and montelukast. Try changing from Atrovent to azelastine nasal spray.

## 2016-09-30 NOTE — Assessment & Plan Note (Signed)
Await labs. Adjust regimen as indicated by results.  

## 2016-09-30 NOTE — Assessment & Plan Note (Addendum)
Stable, but not particularly well controlled. She remains unwilling to try other treatment. Continue with current dose of escitalopram and counseling.

## 2016-09-30 NOTE — Assessment & Plan Note (Signed)
I believe that her pain would be lessened by improved control of her anxiety and depression, but she is not willing to increase treatment.

## 2016-10-10 ENCOUNTER — Encounter: Payer: Self-pay | Admitting: Physician Assistant

## 2016-10-14 ENCOUNTER — Other Ambulatory Visit: Payer: Self-pay

## 2016-10-14 DIAGNOSIS — M797 Fibromyalgia: Secondary | ICD-10-CM

## 2016-10-14 DIAGNOSIS — L409 Psoriasis, unspecified: Secondary | ICD-10-CM

## 2016-10-14 MED ORDER — OXYCODONE-ACETAMINOPHEN 10-325 MG PO TABS: 1.0000 | ORAL_TABLET | Freq: Two times a day (BID) | ORAL | 0 refills | Status: DC | PRN

## 2016-10-14 NOTE — Telephone Encounter (Signed)
Pt advised rx front

## 2016-10-14 NOTE — Progress Notes (Signed)
My chart message to refill "Pain Meds" Pended Percocet.

## 2016-10-14 NOTE — Telephone Encounter (Signed)
My chart message for refill of "pain Meds" Sent to [facility].

## 2016-10-14 NOTE — Telephone Encounter (Signed)
Rx printed at 104. Will bring to 102 after clinic.  Meds ordered this encounter  Medications  . oxyCODONE-acetaminophen (PERCOCET) 10-325 MG tablet    Sig: Take 1-1.5 tablets by mouth 2 (two) times daily as needed for pain.    Dispense:  90 tablet    Refill:  0

## 2016-10-31 ENCOUNTER — Other Ambulatory Visit: Payer: Self-pay | Admitting: Physician Assistant

## 2016-11-14 ENCOUNTER — Encounter: Payer: Self-pay | Admitting: Internal Medicine

## 2016-11-14 ENCOUNTER — Ambulatory Visit (INDEPENDENT_AMBULATORY_CARE_PROVIDER_SITE_OTHER): Payer: Medicare Other | Admitting: Internal Medicine

## 2016-11-14 ENCOUNTER — Other Ambulatory Visit: Payer: Self-pay

## 2016-11-14 VITALS — BP 122/88 | HR 87 | Ht 67.0 in | Wt 167.2 lb

## 2016-11-14 DIAGNOSIS — I10 Essential (primary) hypertension: Secondary | ICD-10-CM

## 2016-11-14 DIAGNOSIS — I495 Sick sinus syndrome: Secondary | ICD-10-CM

## 2016-11-14 LAB — CUP PACEART INCLINIC DEVICE CHECK
Battery Remaining Longevity: 91 mo
Battery Voltage: 3.01 V
Brady Statistic AP VP Percent: 0.07 %
Brady Statistic AS VS Percent: 0.75 %
Brady Statistic RA Percent Paced: 99.06 %
Brady Statistic RV Percent Paced: 0.07 %
Date Time Interrogation Session: 20180611134232
Implantable Lead Implant Date: 20160202
Implantable Lead Implant Date: 20160202
Implantable Lead Location: 753860
Implantable Pulse Generator Implant Date: 20160202
Lead Channel Impedance Value: 456 Ohm
Lead Channel Impedance Value: 532 Ohm
Lead Channel Pacing Threshold Amplitude: 0.5 V
Lead Channel Pacing Threshold Amplitude: 0.75 V
Lead Channel Pacing Threshold Pulse Width: 0.4 ms
Lead Channel Sensing Intrinsic Amplitude: 4 mV
Lead Channel Sensing Intrinsic Amplitude: 4 mV
Lead Channel Setting Pacing Amplitude: 2 V
Lead Channel Setting Pacing Pulse Width: 0.4 ms
Lead Channel Setting Sensing Sensitivity: 2.8 mV
MDC IDC LEAD LOCATION: 753859
MDC IDC MSMT LEADCHNL RA IMPEDANCE VALUE: 399 Ohm
MDC IDC MSMT LEADCHNL RA IMPEDANCE VALUE: 456 Ohm
MDC IDC MSMT LEADCHNL RV PACING THRESHOLD PULSEWIDTH: 0.4 ms
MDC IDC MSMT LEADCHNL RV SENSING INTR AMPL: 24.875 mV
MDC IDC MSMT LEADCHNL RV SENSING INTR AMPL: 31.625 mV
MDC IDC SET LEADCHNL RV PACING AMPLITUDE: 2.5 V
MDC IDC STAT BRADY AP VS PERCENT: 99.18 %
MDC IDC STAT BRADY AS VP PERCENT: 0 %

## 2016-11-14 NOTE — Patient Instructions (Addendum)
Medication Instructions:  Your physician recommends that you continue on your current medications as directed. Please refer to the Current Medication list given to you today.   Labwork: None ordered   Testing/Procedures: None ordered   Follow-Up:  Remote monitoring is used to monitor your Pacemaker  from home. This monitoring reduces the number of office visits required to check your device to one time per year. It allows [place] to keep an eye on the functioning of your device to ensure it is working properly. You are scheduled for a device check from home on 02/13/17. You may send your transmission at any time that day. If you have a wireless device, the transmission will be sent automatically. After your physician reviews your transmission, you will receive a postcard with your next transmission date.   Your physician wants you to follow-up in: 12 months with Chanetta Marshall, NP You will receive a reminder letter in the mail two months in advance. If you don't receive a letter, please call our office to schedule the follow-up appointment.   Any Other Special Instructions Will Be Listed Below (If Applicable).     If you need a refill on your cardiac medications before your next appointment, please call your pharmacy.

## 2016-11-14 NOTE — Progress Notes (Signed)
PCP: [name], PA-C Primary Cardiologist:  Dr Joylene Igo is a 61 y.o. female who presents today for routine electrophysiology followup.  Since last being seen in our clinic, the patient reports doing very well.  She has minor pain over her pacemaker site from time to time. Today, she denies symptoms of palpitations, exertional chest pain, shortness of breath,  lower extremity edema, dizziness, presyncope, or syncope.  The patient is otherwise without complaint today.   Past Medical History:  Diagnosis Date  . Allergy   . Anal fissure 02/2012  . Anxiety   . Arthritis   . Asthma   . Bursitis    right hip  . Complication of anesthesia     woke up during a colonscopy, difficult to wake up post-op sometimes , requires lots of blankets to stay warm post op  . Depression   . Diabetes mellitus without complication ([place])    type 2 diet controlled  . Fatty liver    sees dr Collene Mares for  . Fibromyalgia   . GERD (gastroesophageal reflux disease)   . Hepatitis A infection 1979  . Hyperlipidemia   . Hypertension   . Irritable bowel syndrome (IBS)   . 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 Psoriasis    left foot  . 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 Tarsal tunnel syndrome of left side    Past Surgical History:  Procedure Laterality Date  . ABDOMINAL HYSTERECTOMY  09/1994   partial  . APPENDECTOMY    . BILATERAL SALPINGOOPHORECTOMY Bilateral 04/2011  . CARDIAC CATHETERIZATION  10/08/2007  . CESAREAN SECTION     x 1  . CHOLECYSTECTOMY N/A 02/16/2015   Procedure: LAPAROSCOPIC CHOLECYSTECTOMY WITH INTRAOPERATIVE CHOLANGIOGRAM;  Surgeon: Johnathan Hausen, MD;  Location: [facility];  Service: General;  Laterality: N/A;  . COLONOSCOPY WITH PROPOFOL N/A 08/19/2016   Procedure: COLONOSCOPY WITH PROPOFOL;  Surgeon: Carol Ada, MD;  Location: [facility];  Service: Endoscopy;  Laterality: N/A;  .  CYSTOSCOPY W/ URETERAL STENT PLACEMENT  08/04/2009  . EXAMINATION UNDER ANESTHESIA  02/17/2012   Procedure: EXAM UNDER ANESTHESIA;  Surgeon: Pedro Earls, MD;  Location: [facility];  Service: General;  Laterality: N/A;  Exam under anesthesia with lateral internal sphincterotomy  . LAPAROSCOPIC LYSIS INTESTINAL ADHESIONS  08/04/2009  . PERMANENT PACEMAKER INSERTION N/A 07/08/2014   MDT MRI compatible dual chamber pacemaker implanted by Dr Rayann Heman for symptomatic bradycardia  . SHOULDER ARTHROSCOPY W/ ROTATOR CUFF REPAIR Left 1999  . SMALL INTESTINE SURGERY    . SPHINCTEROTOMY  02/17/2012   Procedure: SPHINCTEROTOMY;  Surgeon: Pedro Earls, MD;  Location: [place];  Service: General;  Laterality: N/A;    ROS- all systems are reviewed and negative except as per HPI above  Current Outpatient Prescriptions  Medication Sig Dispense Refill  . amoxicillin (AMOXIL) 500 MG tablet Take 500 mg by mouth 3 (three) times daily.  0  . azelastine (ASTELIN) 0.1 % nasal spray Place 2 sprays into both nostrils 2 (two) times daily. Use in each nostril as directed 30 mL 12  . Azilsartan Medoxomil (EDARBI) 40 MG TABS Take 40 mg by mouth daily.     . blood glucose meter kit and supplies KIT Dispense based on patient and insurance preference. Use daily as directed. ICD-10: E11.9 1 each 0  . calcipotriene (DOVONOX) 0.005 % cream Apply  1 application topically 4 (four) times daily as needed (psoriasis).    . cetirizine (ZYRTEC) 10 MG tablet Take 1 tablet (10 mg total) by mouth daily. 30 tablet 11  . cholecalciferol (VITAMIN D) 1000 units tablet Take 1 tablet (1,000 Units total) by mouth daily. 90 tablet 3  . cyclobenzaprine (FLEXERIL) 10 MG tablet Take 5-10 mg by mouth daily as needed for muscle spasms.     . Desoximetasone (TOPICORT) 0.25 % ointment Apply 1 application topically 2 (two) times daily as needed (for psoriasis). 30 g 6  . diazepam (VALIUM) 5 MG tablet Take 1 tablet (5 mg  total) by mouth daily as needed for anxiety or muscle spasms. 30 tablet 0  . dicyclomine (BENTYL) 10 MG capsule Take 1 capsule (10 mg total) by mouth 4 (four) times daily -  before meals and at bedtime. 30 capsule 1  . diphenoxylate-atropine (LOMOTIL) 2.5-0.025 MG tablet Take 2 tablets by mouth 2 (two) times daily as needed for diarrhea or loose stools.     . docusate sodium (COLACE) 100 MG capsule TAKE 1 CAPSULE (100 MG TOTAL) BY MOUTH DAILY AS NEEDED FOR MILD CONSTIPATION. 30 capsule 0  . escitalopram (LEXAPRO) 20 MG tablet Take 1 tablet (20 mg total) by mouth daily. 90 tablet 3  . ezetimibe (ZETIA) 10 MG tablet Take 10 mg by mouth every evening.     . fluticasone (FLONASE) 50 MCG/ACT nasal spray USE 2 SPRAYS INTO EACH NOSTRIL TWICE DAILY 16 g 2  . fluticasone (FLOVENT HFA) 110 MCG/ACT inhaler Inhale 1-2 puffs into the lungs 2 (two) times daily as needed (shortness of breath). 12 g 12  . ipratropium (ATROVENT) 0.03 % nasal spray USE 2 SPRAYS INTO THE NOSE EVERY 12 (TWELVE) HOURS. 30 mL 9  . loteprednol (LOTEMAX) 0.5 % ophthalmic suspension Place 1 drop into both eyes 2 (two) times daily as needed (itching, swelling).     . meloxicam (MOBIC) 15 MG tablet TAKE 1 TABLET BY MOUTH EVERY DAY 30 tablet 3  . Multiple Vitamin (MULTIVITAMIN) tablet Take 1 tablet by mouth daily.    Marland Kitchen NITROSTAT 0.4 MG SL tablet Place 0.4 mg under the tongue every 5 (five) minutes as needed for chest pain. For chest pain.    Marland Kitchen oxyCODONE-acetaminophen (PERCOCET) 10-325 MG tablet Take 1-1.5 tablets by mouth 2 (two) times daily as needed for pain. 90 tablet 0  . PAZEO 0.7 % SOLN Place 1 drop into both eyes daily as needed for allergies.    Marland Kitchen PROAIR HFA 108 (90 Base) MCG/ACT inhaler INHALE 2 PUFFS 4 TIMES A DAY AS NEEDED 8 Inhaler 0  . Probiotic Product (PROBIOTIC DAILY) CAPS Take 1 capsule by mouth 2 (two) times a week.    Marland Kitchen PROCTOFOAM HC rectal foam Place 1 application rectally daily as needed for hemorrhoids.    . ranitidine  (ZANTAC) 150 MG tablet Take 150 mg by mouth as needed for heartburn (Take as directed).     . zolpidem (AMBIEN) 10 MG tablet Take 5-10 mg by mouth at bedtime as needed for sleep.      No current facility-administered medications for this visit.     Physical Exam: Vitals:   11/14/16 1036  BP: 122/88  Pulse: 87  SpO2: 96%  Weight: 167 lb 3.2 oz (75.8 kg)  Height: 5' 7" (1.702 m)    GEN- The patient is well appearing, alert and oriented x 3 today.   Head- normocephalic, atraumatic Eyes-  Sclera clear, conjunctiva pink Ears- hearing  intact Oropharynx- clear Lungs- Clear to ausculation bilaterally, normal work of breathing Chest- pacemaker pocket is well healed Heart- Regular rate and rhythm, no murmurs, rubs or gallops, PMI not laterally displaced GI- soft, NT, ND, + BS Extremities- no clubbing, cyanosis, or edema  Pacemaker interrogation- reviewed in detail today,  See PACEART report  Assessment and Plan:  1. Sick sinus syndrome Normal pacemaker function See Pace Art report No changes today 99% a paced, histogram looks good Pacemaker pocket appears normal and well healed  2. HTN Stable No change required today  carelink Return to see EP NP every year Follow-up with Dr Doylene Canard as scheduled  [name] MD, [facility] 11/14/2016 10:59 AM

## 2016-11-15 ENCOUNTER — Ambulatory Visit: Payer: 59 | Admitting: Psychiatry

## 2016-11-16 ENCOUNTER — Encounter: Payer: Self-pay | Admitting: Physician Assistant

## 2016-11-16 DIAGNOSIS — M797 Fibromyalgia: Secondary | ICD-10-CM

## 2016-11-16 MED ORDER — OXYCODONE-ACETAMINOPHEN 10-325 MG PO TABS: 1.0000 | ORAL_TABLET | Freq: Two times a day (BID) | ORAL | 0 refills | Status: DC | PRN

## 2016-11-16 NOTE — Telephone Encounter (Signed)
Patient notified via My Chart.  Meds ordered this encounter  Medications  . oxyCODONE-acetaminophen (PERCOCET) 10-325 MG tablet    Sig: Take 1-1.5 tablets by mouth 2 (two) times daily as needed for pain.    Dispense:  90 tablet    Refill:  0    Order Specific Question:   Supervising Provider    Answer:   Brigitte Pulse, EVA N [[id number]]

## 2016-11-20 ENCOUNTER — Emergency Department (HOSPITAL_COMMUNITY): Payer: Medicare Other

## 2016-11-20 ENCOUNTER — Emergency Department (HOSPITAL_COMMUNITY)
Admission: EM | Admit: 2016-11-20 | Discharge: 2016-11-20 | Disposition: A | Discharge status: Home / Self Care | Payer: Medicare Other | Attending: Emergency Medicine | Admitting: Emergency Medicine

## 2016-11-20 ENCOUNTER — Encounter (HOSPITAL_COMMUNITY): Payer: Self-pay | Admitting: Emergency Medicine

## 2016-11-20 DIAGNOSIS — E119 Type 2 diabetes mellitus without complications: Secondary | ICD-10-CM | POA: Insufficient documentation

## 2016-11-20 DIAGNOSIS — Z955 Presence of coronary angioplasty implant and graft: Secondary | ICD-10-CM | POA: Insufficient documentation

## 2016-11-20 DIAGNOSIS — I10 Essential (primary) hypertension: Secondary | ICD-10-CM | POA: Diagnosis not present

## 2016-11-20 DIAGNOSIS — Z91040 Latex allergy status: Secondary | ICD-10-CM | POA: Insufficient documentation

## 2016-11-20 DIAGNOSIS — R109 Unspecified abdominal pain: Secondary | ICD-10-CM | POA: Diagnosis present

## 2016-11-20 DIAGNOSIS — R1032 Left lower quadrant pain: Secondary | ICD-10-CM

## 2016-11-20 DIAGNOSIS — R110 Nausea: Secondary | ICD-10-CM

## 2016-11-20 DIAGNOSIS — Z87891 Personal history of nicotine dependence: Secondary | ICD-10-CM | POA: Diagnosis not present

## 2016-11-20 DIAGNOSIS — Z79899 Other long term (current) drug therapy: Secondary | ICD-10-CM | POA: Diagnosis not present

## 2016-11-20 DIAGNOSIS — K5792 Diverticulitis of intestine, part unspecified, without perforation or abscess without bleeding: Secondary | ICD-10-CM

## 2016-11-20 DIAGNOSIS — J45909 Unspecified asthma, uncomplicated: Secondary | ICD-10-CM | POA: Insufficient documentation

## 2016-11-20 LAB — COMPREHENSIVE METABOLIC PANEL
ALBUMIN: 3.9 g/dL (ref 3.5–5.0)
ALT: 61 U/L — AB (ref 14–54)
AST: 33 U/L (ref 15–41)
Alkaline Phosphatase: 54 U/L (ref 38–126)
Anion gap: 7 (ref 5–15)
BILIRUBIN TOTAL: 0.6 mg/dL (ref 0.3–1.2)
BUN: 7 mg/dL (ref 6–20)
CHLORIDE: 105 mmol/L (ref 101–111)
CO2: 28 mmol/L (ref 22–32)
Calcium: 8.8 mg/dL — ABNORMAL LOW (ref 8.9–10.3)
Creatinine, Ser: 0.65 mg/dL (ref 0.44–1.00)
GFR calc Af Amer: >60 mL/min (ref >60)
GFR calc non Af Amer: >60 mL/min (ref >60)
GLUCOSE: 100 mg/dL — AB (ref 65–99)
POTASSIUM: 3.9 mmol/L (ref 3.5–5.1)
Sodium: 140 mmol/L (ref 135–145)
Total Protein: 7.4 g/dL (ref 6.5–8.1)

## 2016-11-20 LAB — CBC
HEMATOCRIT: 37 % (ref 36.0–46.0)
Hemoglobin: 12.3 g/dL (ref 12.0–15.0)
MCH: 27.5 pg (ref 26.0–34.0)
MCHC: 33.2 g/dL (ref 30.0–36.0)
MCV: 82.8 fL (ref 78.0–100.0)
Platelets: 301 10*3/uL (ref 150–400)
RBC: 4.47 MIL/uL (ref 3.87–5.11)
RDW: 13.5 % (ref 11.5–15.5)
WBC: 7.5 10*3/uL (ref 4.0–10.5)

## 2016-11-20 LAB — LIPASE, BLOOD: LIPASE: 23 U/L (ref 11–51)

## 2016-11-20 MED ORDER — METRONIDAZOLE 500 MG PO TABS
500.0000 mg | ORAL_TABLET | Freq: Once | ORAL | Status: AC
  Administered 2016-11-20: 500 mg via ORAL
  Filled 2016-11-20: qty 1

## 2016-11-20 MED ORDER — CIPROFLOXACIN HCL 500 MG PO TABS
500.0000 mg | ORAL_TABLET | Freq: Once | ORAL | Status: AC
  Administered 2016-11-20: 500 mg via ORAL
  Filled 2016-11-20: qty 1

## 2016-11-20 MED ORDER — TRAMADOL HCL 50 MG PO TABS: 50.0000 mg | ORAL_TABLET | Freq: Four times a day (QID) | ORAL | 0 refills | Status: DC | PRN

## 2016-11-20 MED ORDER — HYDROMORPHONE HCL 1 MG/ML IJ SOLN
1.0000 mg | Freq: Once | INTRAMUSCULAR | Status: AC
  Administered 2016-11-20: 1 mg via INTRAVENOUS
  Filled 2016-11-20: qty 1

## 2016-11-20 MED ORDER — MORPHINE SULFATE (PF) 4 MG/ML IV SOLN
4.0000 mg | Freq: Once | INTRAVENOUS | Status: AC
  Administered 2016-11-20: 4 mg via INTRAVENOUS
  Filled 2016-11-20: qty 1

## 2016-11-20 MED ORDER — METRONIDAZOLE 500 MG PO TABS: 500.0000 mg | ORAL_TABLET | Freq: Two times a day (BID) | ORAL | 0 refills | Status: DC

## 2016-11-20 MED ORDER — CIPROFLOXACIN HCL 500 MG PO TABS: 500.0000 mg | ORAL_TABLET | Freq: Two times a day (BID) | ORAL | 0 refills | Status: DC

## 2016-11-20 MED ORDER — ONDANSETRON 4 MG PO TBDP: 4.0000 mg | ORAL_TABLET | Freq: Three times a day (TID) | ORAL | 0 refills | Status: DC | PRN

## 2016-11-20 MED ORDER — ONDANSETRON HCL 4 MG/2ML IJ SOLN
4.0000 mg | Freq: Once | INTRAMUSCULAR | Status: AC | PRN
  Administered 2016-11-20: 4 mg via INTRAVENOUS
  Filled 2016-11-20: qty 2

## 2016-11-20 MED ORDER — SODIUM CHLORIDE 0.9 % IV BOLUS (SEPSIS)
500.0000 mL | Freq: Once | INTRAVENOUS | Status: AC
  Administered 2016-11-20: 500 mL via INTRAVENOUS

## 2016-11-20 NOTE — Discharge Instructions (Signed)
As we discussed, your CT scan today did show evidence of diverticulitis. Take the antibiotics as directed. You can use the tramadol for pain, uses Zofran for nausea. Watch your diet over the next few days, progress back to normal as tolerated. Follow-up with your primary care doctor. Return here for any new or worsening symptoms.

## 2016-11-20 NOTE — ED Notes (Signed)
Patient reports what feels like diverticular pain to the LLQ abd. Patient states she has had two loose stools in past 24 hours. C/O nausea without emesis. A&Ox4. Patient states she ate almonds, cashews, and peanuts at home on Thursday and on Friday she woke up with this pain. Support person at bedside.

## 2016-11-20 NOTE — ED Provider Notes (Signed)
[facility] DEPT Provider Note   CSN: [id number] Arrival date & time: 11/20/16  0818     History   Chief Complaint Chief Complaint  Patient presents with  . Abdominal Pain    HPI [name] is a 61 y.o. female.  The history is provided by the patient and medical records.    61 year old female with history of seasonal allergies, anxiety, asthma, depression, diabetes, fibromyalgia, GERD, hyperlipidemia, hypertension, history of IBS, history of hepatitis A, presenting to the ED for abdominal pain.  Patient states pain began Friday evening and reminded her of her prior diverticulitis pain. States she started following liquid diet but symptoms continue to worsen. Reports the past 2 days she is mostly been drinking fluids and eating graham crackers. She's not had any other solid food really.  States she has had some nausea as well as some loose stools. She has not had any bloody stools.  States she has had diverticulitis multiple times in the past and this feels similar. Reports she did have a colonoscopy about 5 months ago, they did remove about 5 polyps but she was told the results were normal. Prior abdominal surgeries include appendectomy, cholecystectomy, hysterectomy, C-section, and hemorrhoidectomy with sphincterotomy. Patient reports she did take a dose of her leftover antibiotics from prior infection but has not noticed any change.  Past Medical History:  Diagnosis Date  . Allergy   . Anal fissure 02/2012  . Anxiety   . Arthritis   . Asthma   . Bursitis    right hip  . Complication of anesthesia     woke up during a colonscopy, difficult to wake up post-op sometimes , requires lots of blankets to stay warm post op  . Depression   . Diabetes mellitus without complication ([place])    type 2 diet controlled  . Fatty liver    sees dr Collene Mares for  . Fibromyalgia   . GERD (gastroesophageal reflux disease)   . Hepatitis A infection 1979  . Hyperlipidemia   . Hypertension   .  Irritable bowel syndrome (IBS)   . 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 Psoriasis    left foot  . 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 Tarsal tunnel syndrome of left side     Patient Active Problem List   Diagnosis Date Noted  . Right knee pain 07/04/2016  . Pain in right ankle and joints of right foot 07/04/2016  . Psoriasis 03/10/2015  . Cholecystitis 02/16/2015  . Gallstones 02/15/2015  . Right foot pain 11/13/2014  . Ethmoid sinusitis 11/13/2014  . Abdominal pain, epigastric 11/13/2014  . Vitamin D deficiency 11/13/2014  . 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 Sick sinus syndrome ([place]) 07/08/2014  . Chest pain at rest 07/05/2014  . Chronic rhinitis 10/22/2013  . Fatty liver 10/22/2013  . Interstitial cystitis 10/22/2013  . Hip pain, right 10/22/2013  . Chronic insomnia 09/25/2012  . HTN (hypertension) 09/25/2012  . Hyperlipidemia 09/25/2012  . Diabetes mellitus type 2, controlled, without complications ([place]) 57/26/2035  . GERD 12/04/2008  . History of colonic polyps 12/04/2008  . CONSTIPATION 10/21/2008  . Fibromyalgia 10/21/2008  . LACTOSE INTOLERANCE 11/20/2007  . CHEST PAIN 11/20/2007  . Anxiety and depression 08/02/2007  . Osteoporosis 08/02/2007  . Diverticulosis of large intestine 11/16/2006  . EXTERNAL HEMORRHOIDS 05/12/2005    Past Surgical History:  Procedure Laterality Date  . ABDOMINAL HYSTERECTOMY  09/1994   partial  . APPENDECTOMY    . BILATERAL SALPINGOOPHORECTOMY Bilateral 04/2011  . CARDIAC CATHETERIZATION  10/08/2007  . CESAREAN SECTION     x 1  . CHOLECYSTECTOMY N/A 02/16/2015   Procedure: LAPAROSCOPIC CHOLECYSTECTOMY WITH INTRAOPERATIVE CHOLANGIOGRAM;  Surgeon: Johnathan Hausen, MD;  Location: [facility];  Service: General;  Laterality: N/A;  . COLONOSCOPY WITH PROPOFOL N/A 08/19/2016   Procedure: COLONOSCOPY WITH PROPOFOL;  Surgeon: Carol Ada, MD;  Location: [facility]  ENDOSCOPY;  Service: Endoscopy;  Laterality: N/A;  . CYSTOSCOPY W/ URETERAL STENT PLACEMENT  08/04/2009  . EXAMINATION UNDER ANESTHESIA  02/17/2012   Procedure: EXAM UNDER ANESTHESIA;  Surgeon: Pedro Earls, MD;  Location: [facility];  Service: General;  Laterality: N/A;  Exam under anesthesia with lateral internal sphincterotomy  . LAPAROSCOPIC LYSIS INTESTINAL ADHESIONS  08/04/2009  . PERMANENT PACEMAKER INSERTION N/A 07/08/2014   MDT MRI compatible dual chamber pacemaker implanted by Dr Rayann Heman for symptomatic bradycardia  . SHOULDER ARTHROSCOPY W/ ROTATOR CUFF REPAIR Left 1999  . SMALL INTESTINE SURGERY    . SPHINCTEROTOMY  02/17/2012   Procedure: SPHINCTEROTOMY;  Surgeon: Pedro Earls, MD;  Location: [facility];  Service: General;  Laterality: N/A;    OB History    No data available       Home Medications    Prior to Admission medications   Medication Sig Start Date End Date Taking? Authorizing Provider  beclomethasone (QVAR) 40 MCG/ACT inhaler Inhale 2 puffs into the lungs 2 (two) times daily.   Yes [provider]  cholecalciferol (VITAMIN D) 1000 units tablet Take 1 tablet (1,000 Units total) by mouth daily. 09/13/16  Yes Jeffery, [name], PA-C  Desoximetasone (TOPICORT) 0.25 % ointment Apply 1 application topically 2 (two) times daily as needed (for psoriasis). 11/13/14  Yes Jeffery, [name], PA-C  dexlansoprazole (DEXILANT) 60 MG capsule Take 60 mg by mouth daily.   Yes [provider]  diazepam (VALIUM) 5 MG tablet Take 1 tablet (5 mg total) by mouth daily as needed for anxiety or muscle spasms. 11/26/15  Yes Jeffery, [name], PA-C  diphenoxylate-atropine (LOMOTIL) 2.5-0.025 MG tablet Take 2 tablets by mouth 2 (two) times daily as needed for diarrhea or loose stools.    Yes [provider]  docusate sodium (COLACE) 100 MG capsule TAKE 1 CAPSULE (100 MG TOTAL) BY MOUTH DAILY AS NEEDED FOR MILD CONSTIPATION. 10/23/14  Yes Jeffery,  [name], PA-C  escitalopram (LEXAPRO) 20 MG tablet Take 1 tablet (20 mg total) by mouth daily. 10/06/15  Yes Jeffery, [name], PA-C  ezetimibe (ZETIA) 10 MG tablet Take 10 mg by mouth every evening.    Yes [provider]  fluticasone (FLONASE) 50 MCG/ACT nasal spray USE 2 SPRAYS INTO EACH NOSTRIL TWICE DAILY 09/13/16  Yes Jeffery, [name], PA-C  meloxicam (MOBIC) 15 MG tablet TAKE 1 TABLET BY MOUTH EVERY DAY 02/24/16  Yes Jeffery, [name], PA-C  oxyCODONE-acetaminophen (PERCOCET) 10-325 MG tablet Take 1-1.5 tablets by mouth 2 (two) times daily as needed for pain. 11/16/16  Yes Jeffery, [name], PA-C  PROAIR HFA 108 (90 Base) MCG/ACT inhaler INHALE 2 PUFFS 4 TIMES A DAY AS NEEDED 09/30/16  Yes Jeffery, [name], PA-C  zolpidem (AMBIEN) 10 MG tablet Take 5-10 mg by mouth at bedtime as needed for sleep.  09/28/13  Yes [provider]  amoxicillin (AMOXIL) 500 MG tablet Take 500 mg by mouth 3 (three) times daily. 11/10/16   [provider]  azelastine (ASTELIN) 0.1 % nasal spray Place  2 sprays into both nostrils 2 (two) times daily. Use in each nostril as directed 09/13/16   [name], PA-C  Azilsartan Medoxomil (EDARBI) 40 MG TABS Take 40 mg by mouth daily.     [provider]  blood glucose meter kit and supplies KIT Dispense based on patient and insurance preference. Use daily as directed. ICD-10: E11.9 06/09/15   [name], PA-C  calcipotriene (DOVONOX) 0.005 % cream Apply 1 application topically 4 (four) times daily as needed (psoriasis).    [provider]  cetirizine (ZYRTEC) 10 MG tablet Take 1 tablet (10 mg total) by mouth daily. 09/13/16   [name], PA-C  cyclobenzaprine (FLEXERIL) 10 MG tablet Take 5-10 mg by mouth daily as needed for muscle spasms.     [provider]  dicyclomine (BENTYL) 10 MG capsule Take 1 capsule (10 mg total) by mouth 4 (four) times daily -  before meals and at bedtime. 09/13/16   Jeffery, [name], PA-C  fluticasone  (FLOVENT HFA) 110 MCG/ACT inhaler Inhale 1-2 puffs into the lungs 2 (two) times daily as needed (shortness of breath). 09/13/16   Jeffery, [name], PA-C  ipratropium (ATROVENT) 0.03 % nasal spray USE 2 SPRAYS INTO THE NOSE EVERY 12 (TWELVE) HOURS. 03/10/15   [name], PA-C  loteprednol (LOTEMAX) 0.5 % ophthalmic suspension Place 1 drop into both eyes 2 (two) times daily as needed (itching, swelling).     [provider]  Multiple Vitamin (MULTIVITAMIN) tablet Take 1 tablet by mouth daily.    [provider]  NITROSTAT 0.4 MG SL tablet Place 0.4 mg under the tongue every 5 (five) minutes as needed for chest pain. For chest pain. 11/24/11   [provider]  PAZEO 0.7 % SOLN Place 1 drop into both eyes daily as needed for allergies. 05/27/16   [provider]  Probiotic Product (PROBIOTIC DAILY) CAPS Take 1 capsule by mouth 2 (two) times a week.    [provider]  PROCTOFOAM [facility] rectal foam Place 1 application rectally daily as needed for hemorrhoids. 05/11/16   [provider]  ranitidine (ZANTAC) 150 MG tablet Take 150 mg by mouth as needed for heartburn (Take as directed).     [provider]    Family History Family History  Problem Relation Age of Onset  . Cancer Mother        liver  . Diabetes Mother   . Hyperlipidemia Mother   . Hypertension Mother   . Stroke Mother   . Cancer Maternal Grandmother        breast  . Anesthesia problems Sister        hard to wake up post-op  . Heart disease Father   . Hyperlipidemia Brother   . HIV/AIDS Brother   . Cancer Maternal Grandfather        colon  . Diabetes Brother   . Cancer Brother        lung  . Cancer Paternal Grandfather        COLON    Social History Social History  Substance Use Topics  . Smoking status: Former Smoker    Packs/day: 0.50    Years: 37.00    Types: Cigarettes    Quit date: 06/05/2012  . Smokeless tobacco: Never Used  . Alcohol use No      Allergies   Codeine; Propoxyphene n-acetaminophen; Sulfonamide derivatives; Alka-seltzer [aspirin effervescent]; Norvasc [amlodipine besylate]; Red dye; Singulair [montelukast sodium]; Iohexol; and Latex   Review of Systems Review of  Systems  Gastrointestinal: Positive for abdominal pain and nausea.  All other systems reviewed and are negative.    Physical Exam Updated Vital Signs BP 131/83 (BP Location: Right Arm)   Pulse 62   Temp 98.1 F (36.7 C) (Oral)   Resp 18   SpO2 98%   Physical Exam  Constitutional: She is oriented to person, place, and time. She appears well-developed and well-nourished.  HENT:  Head: Normocephalic and atraumatic.  Mouth/Throat: Oropharynx is clear and moist.  Eyes: Conjunctivae and EOM are normal. Pupils are equal, round, and reactive to light.  Neck: Normal range of motion.  Cardiovascular: Normal rate, regular rhythm and normal heart sounds.   Pulmonary/Chest: Effort normal and breath sounds normal. No respiratory distress. She has no wheezes.  Abdominal: Soft. Bowel sounds are normal. There is tenderness in the suprapubic area and left lower quadrant. There is no rebound.  Abdomen soft, tenderness noted in the suprapubic and left lower quadrant, voluntary guarding  Musculoskeletal: Normal range of motion.  Neurological: She is alert and oriented to person, place, and time.  Skin: Skin is warm and dry.  Psychiatric: She has a normal mood and affect.  Nursing note and vitals reviewed.    ED Treatments / Results  Labs (all labs ordered are listed, but only abnormal results are displayed) Labs Reviewed  COMPREHENSIVE METABOLIC PANEL - Abnormal; Notable for the following:       Result Value   Glucose, Bld 100 (*)    Calcium 8.8 (*)    ALT 61 (*)    All other components within normal limits  LIPASE, BLOOD  CBC    EKG  EKG Interpretation None       Radiology Ct Abdomen Pelvis Wo Contrast  Result Date: 11/20/2016 CLINICAL  DATA:  61 year old female with a history of left lower quadrant abdominal pain EXAM: CT ABDOMEN AND PELVIS WITHOUT CONTRAST TECHNIQUE: Multidetector CT imaging of the abdomen and pelvis was performed following the standard protocol without IV contrast. COMPARISON:  09/17/1999 FINDINGS: Lower chest: Cardiac pacing leads partially imaged. No acute finding. Hepatobiliary: Diffusely decreased attenuation of liver parenchyma. Cholecystectomy. No intrahepatic or extrahepatic biliary ductal dilatation. Pancreas: Unremarkable. No pancreatic ductal dilatation or surrounding inflammatory changes. Spleen: Normal in size without focal abnormality. Adrenals/Urinary Tract: Unremarkable appearance of adrenal glands. Unremarkable appearance the bilateral kidneys with no hydronephrosis or nephrolithiasis. No perinephric stranding. Unremarkable appearance of the urinary bladder. Stomach/Bowel: Unremarkable appearance of stomach. Unremarkable small bowel with no abnormal distention. No transition point. No significant inflammatory changes of the mesenteric. Appendix is not visualized, however, no inflammatory changes are present adjacent to the cecum to indicate an appendicitis. Inflammatory changes of the mesenteric fat associated with sigmoid colon in the left pelvis. Trace fluid within the peritoneal on the left lateral aspect of the sigmoid colon. Multiple colonic diverticula. Mild circumferential bowel wall thickening. No transition point or obstruction. Vascular/Lymphatic: Minimal calcifications of the abdominal aorta. No aneurysm. Reproductive: Hysterectomy Other: Small fat containing umbilical hernia Lymph nodes of the inguinal region. Musculoskeletal: No displaced fracture.  No bony canal narrowing. IMPRESSION: Mild inflammatory changes associated with the sigmoid colon. The leading differential diagnosis favored to be diverticulitis, although other infectious/inflammatory or ischemic colitis could have this appearance. No  evidence of abscess. Steatosis. Electronically Signed   By: Corrie Mckusick D.O.   On: 11/20/2016 09:35    Procedures Procedures (including critical care time)  Medications Ordered in ED Medications  ondansetron (ZOFRAN) injection 4 mg (4 mg Intravenous Given  11/20/16 0936)  sodium chloride 0.9 % bolus 500 mL (0 mLs Intravenous Stopped 11/20/16 1022)  morphine 4 MG/ML injection 4 mg (4 mg Intravenous Given 11/20/16 0937)  HYDROmorphone (DILAUDID) injection 1 mg (1 mg Intravenous Given 11/20/16 1111)  ciprofloxacin (CIPRO) tablet 500 mg (500 mg Oral Given 11/20/16 1108)  metroNIDAZOLE (FLAGYL) tablet 500 mg (500 mg Oral Given 11/20/16 1108)     Initial Impression / Assessment and Plan / ED Course  I have reviewed the triage vital signs and the nursing notes.  Pertinent labs & imaging results that were available during my care of the patient were reviewed by me and considered in my medical decision making (see chart for details).  61 y.o. F here with LLQ abdominal pain.  Hx of diverticulitis in the past.  She is afebrile, non-toxic in appearance.  Tenderness in LLQ with voluntary guarding.  Labs and CT obtained-- Diverticulitis confirmed without compensating features such as abscess or perforation. Patient remains hemodynamically stable. She's been able to tolerate oral antibiotics and fluids without issue. She appears stable for outpatient management. Will start on Cipro/Flagyl. Pain and nausea medication prescribed for her for symptomatically treatment. Can follow-up with PCP.  Discussed plan with patient, she acknowledged understanding and agreed with plan of care.  Return precautions given for new or worsening symptoms.   Final Clinical Impressions(s) / ED Diagnoses   Final diagnoses:  Diverticulitis  Nausea    New Prescriptions Discharge Medication List as of 11/20/2016 11:40 AM    START taking these medications   Details  ciprofloxacin (CIPRO) 500 MG tablet Take 1 tablet (500 mg total)  by mouth every 12 (twelve) hours., Starting Sun 11/20/2016, Print    metroNIDAZOLE (FLAGYL) 500 MG tablet Take 1 tablet (500 mg total) by mouth 2 (two) times daily., Starting Sun 11/20/2016, Print    ondansetron (ZOFRAN ODT) 4 MG disintegrating tablet Take 1 tablet (4 mg total) by mouth every 8 (eight) hours as needed for nausea., Starting Sun 11/20/2016, Print    traMADol (ULTRAM) 50 MG tablet Take 1 tablet (50 mg total) by mouth every 6 (six) hours as needed., Starting Sun 11/20/2016, Print         Larene Pickett, PA-C 11/20/16 1248    Tanna Furry, MD 11/21/16 [phone]

## 2016-11-20 NOTE — ED Triage Notes (Signed)
Pt c/o LLQ abdominal pain, nausea onset Friday, consistent with past diverticulitis episodes. No blood in stool.

## 2016-11-22 ENCOUNTER — Inpatient Hospital Stay: Payer: Medicare Other | Admitting: Physician Assistant

## 2016-11-24 ENCOUNTER — Encounter: Payer: Self-pay | Admitting: Physician Assistant

## 2016-11-24 ENCOUNTER — Other Ambulatory Visit: Payer: Self-pay | Admitting: Physician Assistant

## 2016-11-24 ENCOUNTER — Ambulatory Visit (INDEPENDENT_AMBULATORY_CARE_PROVIDER_SITE_OTHER): Payer: Medicare Other | Admitting: Physician Assistant

## 2016-11-24 VITALS — BP 132/79 | HR 67 | Temp 98.3°F | Resp 18 | Ht 67.0 in | Wt 168.8 lb

## 2016-11-24 DIAGNOSIS — F32A Depression, unspecified: Secondary | ICD-10-CM

## 2016-11-24 DIAGNOSIS — E119 Type 2 diabetes mellitus without complications: Secondary | ICD-10-CM | POA: Diagnosis not present

## 2016-11-24 DIAGNOSIS — F329 Major depressive disorder, single episode, unspecified: Secondary | ICD-10-CM

## 2016-11-24 DIAGNOSIS — R1084 Generalized abdominal pain: Secondary | ICD-10-CM | POA: Diagnosis not present

## 2016-11-24 DIAGNOSIS — F419 Anxiety disorder, unspecified: Principal | ICD-10-CM

## 2016-11-24 NOTE — Progress Notes (Signed)
Patient ID: [name], female    DOB: [birth year], 61 y.o.   MRN: [id number]  PCP: [name], PA-C  Chief Complaint  Patient presents with  . Diverticulitis    11/20/2016 hosptial visit  . Follow-up    per pt brought paperwork in for [name] to fill out.    Subjective:   Presents for ED visit follow-up with persistent abdominal pain. She is unaccompanied in the exam room, but her daughter is here for a visit as well, in the next room.  Presented to the ED on 6/17 with abdominal pain. Symptoms were reminiscent of previous episodes of diverticulitis and CT scan revealed mild inflammatory changes associated with sigmoid colon, diverticulitis favored. She was started on oral antibiotics and discharged to home. Tolerating oral cipro and flagyl. Able to drink. Ate a little solid food last night, which was "okay." "Its the bowel movement that kills me." "Starts with gas. When I went to sit down, the pain was so bad that my toes bent back." Pain" goes all the way around" to her back. After BM, pain improves, but does not resolve. Percocet + stool softener resolves the pain temporarily.  Believes that she should have been admitted for at least one day, believing that the IV cipro works better than the oral preparation, and that she would be well by now had she been in hospital on IV therapy for at least the one day, maybe two.  No fever, chills. No cough. No CP, SOB, HA, dizziness.   Review of Systems As above.    Patient Active Problem List   Diagnosis Date Noted  . Right knee pain 07/04/2016  . Pain in right ankle and joints of right foot 07/04/2016  . Psoriasis 03/10/2015  . Cholecystitis 02/16/2015  . Gallstones 02/15/2015  . Right foot pain 11/13/2014  . Ethmoid sinusitis 11/13/2014  . Abdominal pain, epigastric 11/13/2014  . Vitamin D deficiency 11/13/2014  . 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 Sick sinus syndrome ([place]) 07/08/2014  . Chest pain at rest  07/05/2014  . Chronic rhinitis 10/22/2013  . Fatty liver 10/22/2013  . Interstitial cystitis 10/22/2013  . Hip pain, right 10/22/2013  . Chronic insomnia 09/25/2012  . HTN (hypertension) 09/25/2012  . Hyperlipidemia 09/25/2012  . Diabetes mellitus type 2, controlled, without complications ([place]) 49/20/1007  . GERD 12/04/2008  . History of colonic polyps 12/04/2008  . CONSTIPATION 10/21/2008  . Fibromyalgia 10/21/2008  . LACTOSE INTOLERANCE 11/20/2007  . CHEST PAIN 11/20/2007  . Anxiety and depression 08/02/2007  . Osteoporosis 08/02/2007  . Diverticulosis of large intestine 11/16/2006  . EXTERNAL HEMORRHOIDS 05/12/2005     Prior to Admission medications   Medication Sig Start Date End Date Taking? Authorizing Provider  Azilsartan Medoxomil (EDARBI) 40 MG TABS Take 40 mg by mouth daily.    Yes [provider]  beclomethasone (QVAR) 40 MCG/ACT inhaler Inhale 2 puffs into the lungs 2 (two) times daily.   Yes [provider]  blood glucose meter kit and supplies KIT Dispense based on patient and insurance preference. Use daily as directed. ICD-10: E11.9 06/09/15  Yes Yesena Reaves, PA-C  cetirizine (ZYRTEC) 10 MG tablet Take 1 tablet (10 mg total) by mouth daily. 09/13/16  Yes Nyasia Baxley, PA-C  cholecalciferol (VITAMIN D) 1000 units tablet Take 1 tablet (1,000 Units total) by mouth daily. 09/13/16  Yes Kellyanne Ellwanger, PA-C  ciprofloxacin (CIPRO) 500 MG tablet Take 1 tablet (500 mg total) by mouth every 12 (twelve) hours.  11/20/16  Yes Larene Pickett, PA-C  cyclobenzaprine (FLEXERIL) 10 MG tablet Take 5-10 mg by mouth daily as needed for muscle spasms.    Yes [provider]  Desoximetasone (TOPICORT) 0.25 % ointment Apply 1 application topically 2 (two) times daily as needed (for psoriasis). 11/13/14  Yes Jeffery, [name], PA-C  dexlansoprazole (DEXILANT) 60 MG capsule Take 60 mg by mouth daily.   Yes [provider]  diazepam (VALIUM) 5 MG tablet Take  1 tablet (5 mg total) by mouth daily as needed for anxiety or muscle spasms. 11/26/15  Yes Jeffery, [name], PA-C  dicyclomine (BENTYL) 10 MG capsule Take 1 capsule (10 mg total) by mouth 4 (four) times daily -  before meals and at bedtime. 09/13/16  Yes Jeffery, [name], PA-C  diphenoxylate-atropine (LOMOTIL) 2.5-0.025 MG tablet Take 2 tablets by mouth 2 (two) times daily as needed for diarrhea or loose stools.    Yes [provider]  docusate sodium (COLACE) 100 MG capsule TAKE 1 CAPSULE (100 MG TOTAL) BY MOUTH DAILY AS NEEDED FOR MILD CONSTIPATION. 10/23/14  Yes Jeffery, [name], PA-C  escitalopram (LEXAPRO) 20 MG tablet TAKE 1 TABLET EVERY DAY 11/24/16  Yes Jeffery, [name], PA-C  ezetimibe (ZETIA) 10 MG tablet Take 10 mg by mouth every evening.    Yes [provider]  fluticasone (FLONASE) 50 MCG/ACT nasal spray USE 2 SPRAYS INTO EACH NOSTRIL TWICE DAILY 09/13/16  Yes Jeffery, [name], PA-C  fluticasone (FLOVENT HFA) 110 MCG/ACT inhaler Inhale 1-2 puffs into the lungs 2 (two) times daily as needed (shortness of breath). 09/13/16  Yes Jeffery, [name], PA-C  ipratropium (ATROVENT) 0.03 % nasal spray USE 2 SPRAYS INTO THE NOSE EVERY 12 (TWELVE) HOURS. 03/10/15  Yes Jeffery, [name], PA-C  loteprednol (LOTEMAX) 0.5 % ophthalmic suspension Place 1 drop into both eyes 2 (two) times daily as needed (itching, swelling).    Yes [provider]  meloxicam (MOBIC) 15 MG tablet TAKE 1 TABLET BY MOUTH EVERY DAY 02/24/16  Yes Jeffery, [name], PA-C  metroNIDAZOLE (FLAGYL) 500 MG tablet Take 1 tablet (500 mg total) by mouth 2 (two) times daily. 11/20/16  Yes Larene Pickett, PA-C  NITROSTAT 0.4 MG SL tablet Place 0.4 mg under the tongue every 5 (five) minutes as needed for chest pain. For chest pain. 11/24/11  Yes [provider]  ondansetron (ZOFRAN ODT) 4 MG disintegrating tablet Take 1 tablet (4 mg total) by mouth every 8 (eight) hours as needed for nausea. 11/20/16  Yes Larene Pickett, PA-C   oxyCODONE-acetaminophen (PERCOCET) 10-325 MG tablet Take 1-1.5 tablets by mouth 2 (two) times daily as needed for pain. 11/16/16  Yes Jeffery, [name], PA-C  PROAIR HFA 108 (90 Base) MCG/ACT inhaler INHALE 2 PUFFS 4 TIMES A DAY AS NEEDED 09/30/16  Yes Jeffery, [name], PA-C  Probiotic Product (PROBIOTIC DAILY) CAPS Take 1 capsule by mouth 2 (two) times a week.   Yes [provider]  ranitidine (ZANTAC) 150 MG tablet Take 150 mg by mouth as needed for heartburn (Take as directed).    Yes [provider]  traMADol (ULTRAM) 50 MG tablet Take 1 tablet (50 mg total) by mouth every 6 (six) hours as needed. 11/20/16  Yes Larene Pickett, PA-C  zolpidem (AMBIEN) 10 MG tablet Take 5-10 mg by mouth at bedtime as needed for sleep.  09/28/13  Yes [provider]  azelastine (ASTELIN) 0.1 % nasal spray Place 2 sprays into both nostrils 2 (two) times daily. Use in each nostril as  directed Patient not taking: Reported on 11/20/2016 09/13/16   [name], PA-C  Multiple Vitamin (MULTIVITAMIN) tablet Take 1 tablet by mouth daily.    [provider]     Allergies  Allergen Reactions  . Codeine Other (See Comments)    HALLUCINATIONS   . Propoxyphene N-Acetaminophen Other (See Comments)    HALLUCINATIONS  . Sulfonamide Derivatives Hives, Itching and Swelling  . Alka-Seltzer [Aspirin Effervescent] Other (See Comments)    "like she's fading away", jittery   . Norvasc [Amlodipine Besylate]     "passed out"  . Red Dye     "burning and tingling sensation"  . Singulair [Montelukast Sodium] Other (See Comments)    Increased depression, sadness  . Iohexol Nausea Only    JITTERY    . Latex Hives and Itching       Objective:  Physical Exam  Constitutional: She is oriented to person, place, and time. She appears well-developed and well-nourished. She is active and cooperative.  Non-toxic appearance. She has a sickly appearance. No distress.  BP 132/79 (BP Location: Right  Arm, Patient Position: Prone, Cuff Size: Normal)   Pulse 67   Temp 98.3 F (36.8 C) (Oral)   Resp 18   Ht 5' 7" (1.702 m)   Wt 168 lb 12.8 oz (76.6 kg)   SpO2 99%   BMI 26.44 kg/m  Lying on the exam table under a blanket.  HENT:  Head: Normocephalic and atraumatic.  Right Ear: Hearing normal.  Left Ear: Hearing normal.  Eyes: Conjunctivae are normal. No scleral icterus.  Neck: Normal range of motion. Neck supple. No thyromegaly present.  Cardiovascular: Normal rate, regular rhythm and normal heart sounds.   Pulses:      Radial pulses are 2+ on the right side, and 2+ on the left side.  Pulmonary/Chest: Effort normal and breath sounds normal.  Abdominal: Soft. Normal appearance and bowel sounds are normal. There is no hepatosplenomegaly. There is generalized tenderness. There is guarding (voluntary). There is no rigidity, no rebound and no CVA tenderness.  Lymphadenopathy:       Head (right side): No tonsillar, no preauricular, no posterior auricular and no occipital adenopathy present.       Head (left side): No tonsillar, no preauricular, no posterior auricular and no occipital adenopathy present.    She has no cervical adenopathy.       Right: No supraclavicular adenopathy present.       Left: No supraclavicular adenopathy present.  Neurological: She is alert and oriented to person, place, and time. No sensory deficit.  Skin: Skin is warm, dry and intact. No rash noted. No cyanosis or erythema. Nails show no clubbing.  Psychiatric: She has a normal mood and affect. Her speech is normal and behavior is normal.   After exam, she is able to rise unassisted from the exam table and walk erect to the lobby to wait for her daughter.        Assessment & Plan:   Problem List Items Addressed This Visit    Diabetes mellitus type 2, controlled, without complications ([place]) (Chronic)    Has been well controlled. No changes made today.       Other Visit Diagnoses    Generalized  abdominal pain    -  Primary   Most likely due to diverticulitis. RESUME ranitidine and dicyclomine. Contact GI for follow-up visit there. RTC if symptoms worsen, unable to tolerate PO, etc.       Return as planned 12/20/2016.  Fara Chute, PA-C [facility]

## 2016-11-24 NOTE — Progress Notes (Signed)
Subjective:    Patient ID: [name], female    DOB: August 05, 1955, 61 y.o.   MRN: [id number]  Chief Complaint  Patient presents with  . Diverticulitis    11/20/2016 hosptial visit  . Follow-up    per pt brought paperwork in for [name] to fill out.    HPI: 61 y/o F presents today for hospital follow up of diverticulitis. She developed abdominal pain on 6/16 and was seen on 6/17 at [facility] ED for abdominal pains. She was worked up with lab work and an abdominal CT. "IMPRESSION: Mild inflammatory changes associated with the sigmoid colon. The leading differential diagnosis favored to be diverticulitis, although other infectious/inflammatory or ischemic colitis could have this appearance. No evidence of abscess. Steatosis." She was put on ORAL ciprofloxacin and flagyl to treat the diverticulitis, and discharged.  Today patient is lying on the office table, and appears uncomfortable. She states that she wishes they had kept her in the hospital on IV antibiotics because those usually make her better. She has been taking the ciprofloxacin and flagyl as prescribed and only feels a little better in the last 4 days. She continues to be a little nauseous with no vomiting. She rates her pain >10/10, "Worse than labor pains". Pain is mostly on the left lower side and in suprapubic area. She had a painful loose stool this morning. Last night she ate solid food for the first time in days and woke up this morning feeling "Awful". Pains are so bad that she can't stand up straight. States that she has had a fever but has not taken temperature at home. Is having chills, but also gets hot flashes and then feels cold afterwards. She is taking percocet for the pain with minimal relief. She denies blood in her stool or urine but states that she had some dysuria and dark urine on 6/17 that has resolved.   Patient Active Problem List   Diagnosis Date Noted  . Right knee pain 07/04/2016  . Pain in right ankle and  joints of right foot 07/04/2016  . Psoriasis 03/10/2015  . Cholecystitis 02/16/2015  . Gallstones 02/15/2015  . Right foot pain 11/13/2014  . Ethmoid sinusitis 11/13/2014  . Abdominal pain, epigastric 11/13/2014  . Vitamin D deficiency 11/13/2014  . 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 Sick sinus syndrome ([place]) 07/08/2014  . Chest pain at rest 07/05/2014  . Chronic rhinitis 10/22/2013  . Fatty liver 10/22/2013  . Interstitial cystitis 10/22/2013  . Hip pain, right 10/22/2013  . Chronic insomnia 09/25/2012  . HTN (hypertension) 09/25/2012  . Hyperlipidemia 09/25/2012  . Diabetes mellitus type 2, controlled, without complications ([place]) 14/43/1540  . GERD 12/04/2008  . History of colonic polyps 12/04/2008  . CONSTIPATION 10/21/2008  . Fibromyalgia 10/21/2008  . LACTOSE INTOLERANCE 11/20/2007  . CHEST PAIN 11/20/2007  . Anxiety and depression 08/02/2007  . Osteoporosis 08/02/2007  . Diverticulosis of large intestine 11/16/2006  . EXTERNAL HEMORRHOIDS 05/12/2005   Past Medical History:  Diagnosis Date  . Allergy   . Anal fissure 02/2012  . Anxiety   . Arthritis   . Asthma   . Bursitis    right hip  . Complication of anesthesia     woke up during a colonscopy, difficult to wake up post-op sometimes , requires lots of blankets to stay warm post op  . Depression   . Diabetes mellitus without complication ([place])    type 2 diet controlled  . Fatty liver    sees  dr Collene Mares for  . Fibromyalgia   . GERD (gastroesophageal reflux disease)   . Hepatitis A infection 1979  . Hyperlipidemia   . Hypertension   . Irritable bowel syndrome (IBS)   . 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 Psoriasis    left foot  . 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 Tarsal tunnel syndrome of left side    Prior to Admission medications   Medication Sig Start Date End Date Taking? Authorizing Provider  Azilsartan  Medoxomil (EDARBI) 40 MG TABS Take 40 mg by mouth daily.    Yes [provider]  beclomethasone (QVAR) 40 MCG/ACT inhaler Inhale 2 puffs into the lungs 2 (two) times daily.   Yes [provider]  blood glucose meter kit and supplies KIT Dispense based on patient and insurance preference. Use daily as directed. ICD-10: E11.9 06/09/15  Yes Jeffery, [name], PA-C  cetirizine (ZYRTEC) 10 MG tablet Take 1 tablet (10 mg total) by mouth daily. 09/13/16  Yes Jeffery, [name], PA-C  cholecalciferol (VITAMIN D) 1000 units tablet Take 1 tablet (1,000 Units total) by mouth daily. 09/13/16  Yes Jeffery, [name], PA-C  ciprofloxacin (CIPRO) 500 MG tablet Take 1 tablet (500 mg total) by mouth every 12 (twelve) hours. 11/20/16  Yes Larene Pickett, PA-C  cyclobenzaprine (FLEXERIL) 10 MG tablet Take 5-10 mg by mouth daily as needed for muscle spasms.    Yes [provider]  Desoximetasone (TOPICORT) 0.25 % ointment Apply 1 application topically 2 (two) times daily as needed (for psoriasis). 11/13/14  Yes Jeffery, [name], PA-C  dexlansoprazole (DEXILANT) 60 MG capsule Take 60 mg by mouth daily.   Yes [provider]  diazepam (VALIUM) 5 MG tablet Take 1 tablet (5 mg total) by mouth daily as needed for anxiety or muscle spasms. 11/26/15  Yes Jeffery, [name], PA-C  dicyclomine (BENTYL) 10 MG capsule Take 1 capsule (10 mg total) by mouth 4 (four) times daily -  before meals and at bedtime. 09/13/16  Yes Jeffery, [name], PA-C  diphenoxylate-atropine (LOMOTIL) 2.5-0.025 MG tablet Take 2 tablets by mouth 2 (two) times daily as needed for diarrhea or loose stools.    Yes [provider]  docusate sodium (COLACE) 100 MG capsule TAKE 1 CAPSULE (100 MG TOTAL) BY MOUTH DAILY AS NEEDED FOR MILD CONSTIPATION. 10/23/14  Yes Jeffery, [name], PA-C  escitalopram (LEXAPRO) 20 MG tablet TAKE 1 TABLET EVERY DAY 11/24/16  Yes Jeffery, [name], PA-C  ezetimibe (ZETIA) 10 MG tablet Take 10 mg by mouth every  evening.    Yes [provider]  fluticasone (FLONASE) 50 MCG/ACT nasal spray USE 2 SPRAYS INTO EACH NOSTRIL TWICE DAILY 09/13/16  Yes Jeffery, [name], PA-C  fluticasone (FLOVENT HFA) 110 MCG/ACT inhaler Inhale 1-2 puffs into the lungs 2 (two) times daily as needed (shortness of breath). 09/13/16  Yes Jeffery, [name], PA-C  ipratropium (ATROVENT) 0.03 % nasal spray USE 2 SPRAYS INTO THE NOSE EVERY 12 (TWELVE) HOURS. 03/10/15  Yes Jeffery, [name], PA-C  loteprednol (LOTEMAX) 0.5 % ophthalmic suspension Place 1 drop into both eyes 2 (two) times daily as needed (itching, swelling).    Yes [provider]  meloxicam (MOBIC) 15 MG tablet TAKE 1 TABLET BY MOUTH EVERY DAY 02/24/16  Yes Jeffery, [name], PA-C  metroNIDAZOLE (FLAGYL) 500 MG tablet Take 1 tablet (500 mg total) by mouth 2 (two) times daily. 11/20/16  Yes Larene Pickett,  PA-C  NITROSTAT 0.4 MG SL tablet Place 0.4 mg under the tongue every 5 (five) minutes as needed for chest pain. For chest pain. 11/24/11  Yes [provider]  ondansetron (ZOFRAN ODT) 4 MG disintegrating tablet Take 1 tablet (4 mg total) by mouth every 8 (eight) hours as needed for nausea. 11/20/16  Yes Larene Pickett, PA-C  oxyCODONE-acetaminophen (PERCOCET) 10-325 MG tablet Take 1-1.5 tablets by mouth 2 (two) times daily as needed for pain. 11/16/16  Yes Jeffery, [name], PA-C  PROAIR HFA 108 (90 Base) MCG/ACT inhaler INHALE 2 PUFFS 4 TIMES A DAY AS NEEDED 09/30/16  Yes Jeffery, [name], PA-C  Probiotic Product (PROBIOTIC DAILY) CAPS Take 1 capsule by mouth 2 (two) times a week.   Yes [provider]  ranitidine (ZANTAC) 150 MG tablet Take 150 mg by mouth as needed for heartburn (Take as directed).    Yes [provider]  traMADol (ULTRAM) 50 MG tablet Take 1 tablet (50 mg total) by mouth every 6 (six) hours as needed. 11/20/16  Yes Larene Pickett, PA-C  zolpidem (AMBIEN) 10 MG tablet Take 5-10 mg by mouth at bedtime as needed for sleep.   09/28/13  Yes [provider]  azelastine (ASTELIN) 0.1 % nasal spray Place 2 sprays into both nostrils 2 (two) times daily. Use in each nostril as directed Patient not taking: Reported on 11/20/2016 09/13/16   [name], PA-C  Multiple Vitamin (MULTIVITAMIN) tablet Take 1 tablet by mouth daily.    [provider]   Allergies  Allergen Reactions  . Codeine Other (See Comments)    HALLUCINATIONS   . Propoxyphene N-Acetaminophen Other (See Comments)    HALLUCINATIONS  . Sulfonamide Derivatives Hives, Itching and Swelling  . Alka-Seltzer [Aspirin Effervescent] Other (See Comments)    "like she's fading away", jittery   . Norvasc [Amlodipine Besylate]     "passed out"  . Red Dye     "burning and tingling sensation"  . Singulair [Montelukast Sodium] Other (See Comments)    Increased depression, sadness  . Iohexol Nausea Only    JITTERY    . Latex Hives and Itching     Review of Systems  Constitutional: Positive for chills, fatigue and fever.  Gastrointestinal: Positive for abdominal pain, nausea and rectal pain. Negative for blood in stool, constipation, diarrhea and vomiting.  Genitourinary: Positive for dysuria. Negative for frequency, hematuria, urgency, vaginal bleeding and vaginal discharge.  Psychiatric/Behavioral: Negative for sleep disturbance.       Objective:   Physical Exam  Constitutional: She is oriented to person, place, and time. She appears well-developed and well-nourished. No distress.  BP 132/79 (BP Location: Right Arm, Patient Position: Prone, Cuff Size: Normal)   Pulse 67   Temp 98.3 F (36.8 C) (Oral)   Resp 18   Ht 5' 7"  (1.702 m)   Wt 168 lb 12.8 oz (76.6 kg)   SpO2 99%   BMI 26.44 kg/m    HENT:  Head: Normocephalic.  Eyes: Conjunctivae and EOM are normal. Pupils are equal, round, and reactive to light.  Cardiovascular: Normal rate, regular rhythm, normal heart sounds and intact distal pulses.   Abdominal: Soft. Bowel  sounds are normal. She exhibits no distension. There is tenderness (LLQ and suprapubic area worse on left). There is guarding. There is no rebound.  Neurological: She is alert and oriented to person, place, and time.  Skin: Skin is warm and dry. She is not diaphoretic.  Psychiatric: She has a normal mood  and affect. Her behavior is normal. Judgment and thought content normal.         Assessment & Plan:  1. Generalized abdominal pain Continue taking Ciprofloxacin and Flagyl as prescribed in ED. Start taking the Zantac that was prescribed in the ED. Start taking Bentyl that was previously prescribed. Contine Colace to help with painful BM. Drink plenty of water and get plenty of rest.  Eat a bland diet and avoid dairy.  Follow up with your Gastroenterologist for continued pain/ unresolved diverticulitis.Follow up with ED if you develop high fever, vomiting, diarrhea or blood in stools.   Return as planned 12/20/2016.

## 2016-11-24 NOTE — Assessment & Plan Note (Signed)
Has been well controlled. No changes made today.

## 2016-11-24 NOTE — Patient Instructions (Addendum)
RESTART: -dicyclomine (Bentyl) -ranitidine (Zantac)  CONTINUE: -your regular medications -Cipro and Flagyl  CONTACT: -Dr. Blair Dolphin to schedule a follow-up due to diverticulitis    IF you received an x-ray today, you will receive an invoice from [facility]. Please contact [facility] at [phone] with questions or concerns regarding your invoice.   IF you received labwork today, you will receive an invoice from [facility]. Please contact [facility] at [phone] with questions or concerns regarding your invoice.   Our billing staff will not be able to assist you with questions regarding bills from these companies.  You will be contacted with the lab results as soon as they are available. The fastest way to get your results is to activate your My Chart account. Instructions are located on the last page of this paperwork. If you have not heard from [place] regarding the results in 2 weeks, please contact this office.    Bland Diet A bland diet consists of foods that do not have a lot of fat or fiber. Foods without fat or fiber are easier for the body to digest. They are also less likely to irritate your mouth, throat, stomach, and other parts of your gastrointestinal tract. A bland diet is sometimes called a BRAT diet. What is my plan? Your health care provider or dietitian may recommend specific changes to your diet to prevent and treat your symptoms, such as:  Eating small meals often.  Cooking food until it is soft enough to chew easily.  Chewing your food well.  Drinking fluids slowly.  Not eating foods that are very spicy, sour, or fatty.  Not eating citrus fruits, such as oranges and grapefruit.  What do I need to know about this diet?  Eat a variety of foods from the bland diet food list.  Do not follow a bland diet longer than you have to.  Ask your health care provider whether you should take vitamins. What foods can I eat? Grains  Hot  cereals, such as cream of wheat. Bread, crackers, or tortillas made from refined white flour. Rice. Vegetables Canned or cooked vegetables. Mashed or boiled potatoes. Fruits Bananas. Applesauce. Other types of cooked or canned fruit with the skin and seeds removed, such as canned peaches or pears. Meats and Other Protein Sources Scrambled eggs. Creamy peanut butter or other nut butters. Lean, well-cooked meats, such as chicken or fish. Tofu. Soups or broths. Dairy Low-fat dairy products, such as milk, cottage cheese, or yogurt. Beverages Water. Herbal tea. Apple juice. Sweets and Desserts Pudding. Custard. Fruit gelatin. Ice cream. Fats and Oils Mild salad dressings. Canola or olive oil. The items listed above may not be a complete list of allowed foods or beverages. Contact your dietitian for more options. What foods are not recommended? Foods and ingredients that are often not recommended include:  Spicy foods, such as hot sauce or salsa.  Fried foods.  Sour foods, such as pickled or fermented foods.  Raw vegetables or fruits, especially citrus or berries.  Caffeinated drinks.  Alcohol.  Strongly flavored seasonings or condiments.  The items listed above may not be a complete list of foods and beverages that are not allowed. Contact your dietitian for more information. This information is not intended to replace advice given to you by your health care provider. Make sure you discuss any questions you have with your health care provider. Document Released: 09/14/2015 Document Revised: 10/29/2015 Document Reviewed: 06/04/2014 Elsevier Interactive Patient Education  2018 [employer].

## 2016-12-20 ENCOUNTER — Ambulatory Visit (INDEPENDENT_AMBULATORY_CARE_PROVIDER_SITE_OTHER): Payer: Medicare Other | Admitting: Physician Assistant

## 2016-12-20 ENCOUNTER — Encounter: Payer: Self-pay | Admitting: Physician Assistant

## 2016-12-20 VITALS — BP 129/82 | HR 80 | Temp 97.6°F | Resp 18 | Ht 67.0 in | Wt 162.8 lb

## 2016-12-20 DIAGNOSIS — E119 Type 2 diabetes mellitus without complications: Secondary | ICD-10-CM

## 2016-12-20 DIAGNOSIS — R5381 Other malaise: Secondary | ICD-10-CM

## 2016-12-20 DIAGNOSIS — F419 Anxiety disorder, unspecified: Secondary | ICD-10-CM | POA: Diagnosis not present

## 2016-12-20 DIAGNOSIS — J310 Chronic rhinitis: Secondary | ICD-10-CM | POA: Diagnosis not present

## 2016-12-20 DIAGNOSIS — F32A Depression, unspecified: Secondary | ICD-10-CM

## 2016-12-20 DIAGNOSIS — I10 Essential (primary) hypertension: Secondary | ICD-10-CM

## 2016-12-20 DIAGNOSIS — F329 Major depressive disorder, single episode, unspecified: Secondary | ICD-10-CM | POA: Diagnosis not present

## 2016-12-20 DIAGNOSIS — M797 Fibromyalgia: Secondary | ICD-10-CM | POA: Diagnosis not present

## 2016-12-20 DIAGNOSIS — E785 Hyperlipidemia, unspecified: Secondary | ICD-10-CM | POA: Diagnosis not present

## 2016-12-20 MED ORDER — OXYCODONE-ACETAMINOPHEN 10-325 MG PO TABS
1.0000 | ORAL_TABLET | Freq: Two times a day (BID) | ORAL | 0 refills | Status: DC | PRN
Start: 2016-12-20 — End: 2017-01-27

## 2016-12-20 MED ORDER — BLOOD GLUCOSE MONITOR KIT: PACK | 0 refills | Status: DC

## 2016-12-20 NOTE — Assessment & Plan Note (Signed)
Frequent sinusitis. She'll monitor her symptoms to see if she can get through this flare without antibiotics. Would send in cefdinir if not improving.

## 2016-12-20 NOTE — Progress Notes (Signed)
   Patient ID: [name], female    DOB: 07/21/1955, 60 y.o.   MRN: [id number]  PCP: Jeffery, [name], PA-C  Chief Complaint  Patient presents with  . Diabetes    per pt is wanting a new blood glucose kit.  . Follow-up    3 month follow up    Subjective:   Presents for evaluation of diabetes, requesting a new glucometer.  Diabetes has been very well controlled.  Her other chronic problems are more problematic for her, such that she is disabled. Fatigue. Pain. Sudden exacerbations of pain and fatigue make her have to sit down suddenly. Some days isn't able to get to her mailbox. Has to sit down intermittently to prepare a meal. Has to take frequent breaks to rest while doing routine housework and ADLs. Can't eat regular people's food. Has to buy higher end, organic, unprocessed meat and produce.  Recent episode of diverticulitis took 3 weeks to resolve. Se remains frustrated that she was not admitted for the episode, believing that she would have recovered more quickly had she received inpatient care.  Sinuses are really bad since yesterday. Recalls something that started with a "c" that she really liked. Chart review reveals cefdinir was prescribed 06/2015.    Review of Systems  Constitutional: Positive for fatigue.  HENT: Positive for congestion and sinus pain. Negative for voice change.   Respiratory: Positive for wheezing. Negative for cough and shortness of breath.   Cardiovascular: Negative for chest pain, palpitations and leg swelling.  Gastrointestinal: Negative.        Patient Active Problem List   Diagnosis Date Noted  . Right knee pain 07/04/2016  . Pain in right ankle and joints of right foot 07/04/2016  . Psoriasis 03/10/2015  . Cholecystitis 02/16/2015  . Gallstones 02/15/2015  . Right foot pain 11/13/2014  . Ethmoid sinusitis 11/13/2014  . Abdominal pain, epigastric 11/13/2014  . Vitamin D deficiency 11/13/2014  . 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 Sick sinus syndrome (HCC) 07/08/2014  . Chest pain at rest 07/05/2014  . Chronic rhinitis 10/22/2013  . Fatty liver 10/22/2013  . Interstitial cystitis 10/22/2013  . Hip pain, right 10/22/2013  . Chronic insomnia 09/25/2012  . HTN (hypertension) 09/25/2012  . Hyperlipidemia 09/25/2012  . Diabetes mellitus type 2, controlled, without complications (HCC) 05/22/2012  . GERD 12/04/2008  . History of colonic polyps 12/04/2008  . CONSTIPATION 10/21/2008  . Fibromyalgia 10/21/2008  . LACTOSE INTOLERANCE 11/20/2007  . CHEST PAIN 11/20/2007  . Anxiety and depression 08/02/2007  . Osteoporosis 08/02/2007  . Diverticulosis of large intestine 11/16/2006  . EXTERNAL HEMORRHOIDS 05/12/2005     Prior to Admission medications   Medication Sig Start Date End Date Taking? Authorizing Provider  Azilsartan Medoxomil (EDARBI) 40 MG TABS Take 40 mg by mouth daily.    Yes [provider]  beclomethasone (QVAR) 40 MCG/ACT inhaler Inhale 2 puffs into the lungs 2 (two) times daily.   Yes [provider]  blood glucose meter kit and supplies KIT Dispense based on patient and insurance preference. Use daily as directed. ICD-10: E11.9 06/09/15  Yes Jeffery, [name], PA-C  cetirizine (ZYRTEC) 10 MG tablet Take 1 tablet (10 mg total) by mouth daily. 09/13/16  Yes Jeffery, [name], PA-C  cholecalciferol (VITAMIN D) 1000 units tablet Take 1 tablet (1,000 Units total) by mouth daily. 09/13/16  Yes Jeffery, [name], PA-C  cyclobenzaprine (FLEXERIL) 10 MG tablet Take 5-10 mg by mouth daily as needed for muscle spasms.      Yes [provider]  Desoximetasone (TOPICORT) 0.25 % ointment Apply 1 application topically 2 (two) times daily as needed (for psoriasis). 11/13/14  Yes Alazae Crymes, PA-C  dexlansoprazole (DEXILANT) 60 MG capsule Take 60 mg by mouth daily.   Yes [provider]  diazepam (VALIUM) 5 MG tablet Take 1 tablet (5 mg total) by mouth daily as needed for anxiety or muscle  spasms. 11/26/15  Yes Dacotah Cabello, PA-C  dicyclomine (BENTYL) 10 MG capsule Take 1 capsule (10 mg total) by mouth 4 (four) times daily -  before meals and at bedtime. 09/13/16  Yes Mariko Nowakowski, PA-C  diphenoxylate-atropine (LOMOTIL) 2.5-0.025 MG tablet Take 2 tablets by mouth 2 (two) times daily as needed for diarrhea or loose stools.    Yes [provider]  docusate sodium (COLACE) 100 MG capsule TAKE 1 CAPSULE (100 MG TOTAL) BY MOUTH DAILY AS NEEDED FOR MILD CONSTIPATION. 10/23/14  Yes Jilliane Kazanjian, PA-C  escitalopram (LEXAPRO) 20 MG tablet TAKE 1 TABLET EVERY DAY 11/24/16  Yes Braleigh Massoud, PA-C  ezetimibe (ZETIA) 10 MG tablet Take 10 mg by mouth every evening.    Yes [provider]  fluticasone (FLONASE) 50 MCG/ACT nasal spray USE 2 SPRAYS INTO EACH NOSTRIL TWICE DAILY 09/13/16  Yes Jaisha Villacres, PA-C  fluticasone (FLOVENT HFA) 110 MCG/ACT inhaler Inhale 1-2 puffs into the lungs 2 (two) times daily as needed (shortness of breath). 09/13/16  Yes Aryahi Denzler, PA-C  ipratropium (ATROVENT) 0.03 % nasal spray USE 2 SPRAYS INTO THE NOSE EVERY 12 (TWELVE) HOURS. 03/10/15  Yes Malin Sambrano, PA-C  loteprednol (LOTEMAX) 0.5 % ophthalmic suspension Place 1 drop into both eyes 2 (two) times daily as needed (itching, swelling).    Yes [provider]  meloxicam (MOBIC) 15 MG tablet TAKE 1 TABLET BY MOUTH EVERY DAY 02/24/16  Yes Deshayla Empson, PA-C  Multiple Vitamin (MULTIVITAMIN) tablet Take 1 tablet by mouth daily.   Yes [provider]  NITROSTAT 0.4 MG SL tablet Place 0.4 mg under the tongue every 5 (five) minutes as needed for chest pain. For chest pain. 11/24/11  Yes [provider]  ondansetron (ZOFRAN ODT) 4 MG disintegrating tablet Take 1 tablet (4 mg total) by mouth every 8 (eight) hours as needed for nausea. 11/20/16  Yes Sanders, Lisa M, PA-C  oxyCODONE-acetaminophen (PERCOCET) 10-325 MG tablet Take 1-1.5 tablets by mouth 2 (two) times  daily as needed for pain. 11/16/16  Yes Osby Sweetin, PA-C  PROAIR HFA 108 (90 Base) MCG/ACT inhaler INHALE 2 PUFFS 4 TIMES A DAY AS NEEDED 09/30/16  Yes Buell Parcel, PA-C  Probiotic Product (PROBIOTIC DAILY) CAPS Take 1 capsule by mouth 2 (two) times a week.   Yes [provider]  ranitidine (ZANTAC) 150 MG tablet Take 150 mg by mouth as needed for heartburn (Take as directed).    Yes [provider]  zolpidem (AMBIEN) 10 MG tablet Take 5-10 mg by mouth at bedtime as needed for sleep.  09/28/13  Yes [provider]  azelastine (ASTELIN) 0.1 % nasal spray Place 2 sprays into both nostrils 2 (two) times daily. Use in each nostril as directed Patient not taking: Reported on 11/20/2016 09/13/16   Mliss Wedin, PA-C  ciprofloxacin (CIPRO) 500 MG tablet Take 1 tablet (500 mg total) by mouth every 12 (twelve) hours. Patient not taking: Reported on 12/20/2016 11/20/16   Sanders, Lisa M, PA-C  metroNIDAZOLE (FLAGYL) 500 MG tablet Take 1 tablet (500 mg total) by mouth 2 (two) times daily.   Patient not taking: Reported on 12/20/2016 11/20/16   Sanders, Lisa M, PA-C  traMADol (ULTRAM) 50 MG tablet Take 1 tablet (50 mg total) by mouth every 6 (six) hours as needed. Patient not taking: Reported on 12/20/2016 11/20/16   Sanders, Lisa M, PA-C     Allergies  Allergen Reactions  . Codeine Other (See Comments)    HALLUCINATIONS   . Propoxyphene N-Acetaminophen Other (See Comments)    HALLUCINATIONS  . Sulfonamide Derivatives Hives, Itching and Swelling  . Alka-Seltzer [Aspirin Effervescent] Other (See Comments)    "like she's fading away", jittery   . Norvasc [Amlodipine Besylate]     "passed out"  . Red Dye     "burning and tingling sensation"  . Singulair [Montelukast Sodium] Other (See Comments)    Increased depression, sadness  . Iohexol Nausea Only    JITTERY    . Latex Hives and Itching       Objective:  Physical Exam  Constitutional: She is oriented to person,  place, and time. She appears well-developed and well-nourished. She is active and cooperative. No distress.  BP 129/82 (BP Location: Right Arm, Patient Position: Sitting, Cuff Size: Normal)   Pulse 80   Temp 97.6 F (36.4 C) (Oral)   Resp 18   Ht 5' 7" (1.702 m)   Wt 162 lb 12.8 oz (73.8 kg)   SpO2 100%   BMI 25.50 kg/m   HENT:  Head: Normocephalic and atraumatic.  Right Ear: Hearing, tympanic membrane, external ear and ear canal normal.  Left Ear: Hearing, tympanic membrane, external ear and ear canal normal.  Nose: Mucosal edema present. No rhinorrhea. Right sinus exhibits maxillary sinus tenderness and frontal sinus tenderness. Left sinus exhibits maxillary sinus tenderness and frontal sinus tenderness.  Mouth/Throat: Uvula is midline, oropharynx is clear and moist and mucous membranes are normal.  Eyes: Conjunctivae are normal. No scleral icterus.  Neck: Normal range of motion. Neck supple. No thyromegaly present.  Cardiovascular: Normal rate, regular rhythm and normal heart sounds.   Pulses:      Radial pulses are 2+ on the right side, and 2+ on the left side.  Pulmonary/Chest: Effort normal and breath sounds normal.  Lymphadenopathy:       Head (right side): No tonsillar, no preauricular, no posterior auricular and no occipital adenopathy present.       Head (left side): No tonsillar, no preauricular, no posterior auricular and no occipital adenopathy present.    She has no cervical adenopathy.       Right: No supraclavicular adenopathy present.       Left: No supraclavicular adenopathy present.  Neurological: She is alert and oriented to person, place, and time. No sensory deficit.  Skin: Skin is warm, dry and intact. No rash noted. No cyanosis or erythema. Nails show no clubbing.  Psychiatric: She has a normal mood and affect. Her speech is normal and behavior is normal.           Assessment & Plan:   Problem List Items Addressed This Visit    Anxiety and depression  (Chronic)    Stable.      Diabetes mellitus type 2, controlled, without complications (HCC) (Chronic)    Has been well controlled. Await labs. Adjust regimen as indicated by results.       Relevant Medications   blood glucose meter kit and supplies KIT   Other Relevant Orders   Hemoglobin A1c (Completed)   HTN (hypertension) (Chronic)    Controlled. COntinue current   treatment.      Relevant Orders   CBC with Differential/Platelet (Completed)   Comprehensive metabolic panel (Completed)   Hyperlipidemia (Chronic)    Await labs. Adjust regimen as indicated by results. Goal LDL <70      Relevant Orders   Comprehensive metabolic panel (Completed)   Lipid panel (Completed)   Fibromyalgia - Primary    Continue current treatment.      Relevant Medications   oxyCODONE-acetaminophen (PERCOCET) 10-325 MG tablet   Chronic rhinitis    Frequent sinusitis. She'll monitor her symptoms to see if she can get through this flare without antibiotics. Would send in cefdinir if not improving.       Other Visit Diagnoses    Physical deconditioning       Increased weakness and loss of endurance due to recent diverticulitis. Refer back to PT.   Relevant Orders   Ambulatory referral to Physical Therapy       Return in about 3 months (around 03/22/2017) for blood pressure, diabetes, fibromyalgia.   [name] S. Jeffery, PA-C [facility]  

## 2016-12-20 NOTE — Patient Instructions (Signed)
     IF you received an x-ray today, you will receive an invoice from [facility]. Please contact  Radiology at [phone] with questions or concerns regarding your invoice.   IF you received labwork today, you will receive an invoice from [facility]. Please contact [facility] at [phone] with questions or concerns regarding your invoice.   Our billing staff will not be able to assist you with questions regarding bills from these companies.  You will be contacted with the lab results as soon as they are available. The fastest way to get your results is to activate your My Chart account. Instructions are located on the last page of this paperwork. If you have not heard from us regarding the results in 2 weeks, please contact this office.     

## 2016-12-21 LAB — COMPREHENSIVE METABOLIC PANEL
A/G RATIO: 1.5 (ref 1.2–2.2)
ALK PHOS: 57 IU/L (ref 39–117)
ALT: 49 IU/L — AB (ref 0–32)
AST: 26 IU/L (ref 0–40)
Albumin: 4.5 g/dL (ref 3.6–4.8)
BUN/Creatinine Ratio: 12 (ref 12–28)
BUN: 8 mg/dL (ref 8–27)
Bilirubin Total: 0.3 mg/dL (ref 0.0–1.2)
CALCIUM: 9.7 mg/dL (ref 8.7–10.3)
CO2: 24 mmol/L (ref 20–29)
CREATININE: 0.68 mg/dL (ref 0.57–1.00)
Chloride: 102 mmol/L (ref 96–106)
GFR calc Af Amer: 109 mL/min/{1.73_m2} (ref >59)
GFR, EST NON AFRICAN AMERICAN: 95 mL/min/{1.73_m2} (ref >59)
Globulin, Total: 3 g/dL (ref 1.5–4.5)
Glucose: 101 mg/dL — ABNORMAL HIGH (ref 65–99)
POTASSIUM: 4.5 mmol/L (ref 3.5–5.2)
SODIUM: 143 mmol/L (ref 134–144)
Total Protein: 7.5 g/dL (ref 6.0–8.5)

## 2016-12-21 LAB — CBC WITH DIFFERENTIAL/PLATELET
Basophils Absolute: 0 10*3/uL (ref 0.0–0.2)
Basos: 0 %
EOS (ABSOLUTE): 0.1 10*3/uL (ref 0.0–0.4)
Eos: 1 %
Hematocrit: 40.2 % (ref 34.0–46.6)
Hemoglobin: 12.9 g/dL (ref 11.1–15.9)
IMMATURE GRANULOCYTES: 0 %
Immature Grans (Abs): 0 10*3/uL (ref 0.0–0.1)
LYMPHS ABS: 2 10*3/uL (ref 0.7–3.1)
Lymphs: 39 %
MCH: 26.9 pg (ref 26.6–33.0)
MCHC: 32.1 g/dL (ref 31.5–35.7)
MCV: 84 fL (ref 79–97)
MONOS ABS: 0.4 10*3/uL (ref 0.1–0.9)
Monocytes: 8 %
NEUTROS PCT: 52 %
Neutrophils Absolute: 2.6 10*3/uL (ref 1.4–7.0)
PLATELETS: 259 10*3/uL (ref 150–379)
RBC: 4.8 x10E6/uL (ref 3.77–5.28)
RDW: 14.1 % (ref 12.3–15.4)
WBC: 5.2 10*3/uL (ref 3.4–10.8)

## 2016-12-21 LAB — LIPID PANEL
CHOLESTEROL TOTAL: 188 mg/dL (ref 100–199)
Chol/HDL Ratio: 4.8 ratio — ABNORMAL HIGH (ref 0.0–4.4)
HDL: 39 mg/dL — AB (ref >39)
LDL Calculated: 131 mg/dL — ABNORMAL HIGH (ref 0–99)
TRIGLYCERIDES: 92 mg/dL (ref 0–149)
VLDL Cholesterol Cal: 18 mg/dL (ref 5–40)

## 2016-12-21 LAB — HEMOGLOBIN A1C
Est. average glucose Bld gHb Est-mCnc: 131 mg/dL
Hgb A1c MFr Bld: 6.2 % — ABNORMAL HIGH (ref 4.8–5.6)

## 2016-12-25 NOTE — Assessment & Plan Note (Signed)
Controlled. COntinue current treatment. 

## 2016-12-25 NOTE — Assessment & Plan Note (Signed)
Stable

## 2016-12-25 NOTE — Assessment & Plan Note (Signed)
Has been well controlled. Await labs. Adjust regimen as indicated by results.  

## 2016-12-25 NOTE — Assessment & Plan Note (Addendum)
Await labs. Adjust regimen as indicated by results. Goal LDL <70

## 2016-12-25 NOTE — Assessment & Plan Note (Signed)
Continue current treatment.

## 2016-12-26 ENCOUNTER — Encounter: Payer: Self-pay | Admitting: Physician Assistant

## 2016-12-29 ENCOUNTER — Ambulatory Visit (INDEPENDENT_AMBULATORY_CARE_PROVIDER_SITE_OTHER): Payer: 59 | Admitting: Psychiatry

## 2016-12-29 DIAGNOSIS — F331 Major depressive disorder, recurrent, moderate: Secondary | ICD-10-CM | POA: Diagnosis not present

## 2016-12-31 ENCOUNTER — Other Ambulatory Visit: Payer: Self-pay | Admitting: Physician Assistant

## 2017-01-11 ENCOUNTER — Telehealth: Payer: Self-pay | Admitting: Family Medicine

## 2017-01-21 ENCOUNTER — Telehealth: Payer: Self-pay | Admitting: Physician Assistant

## 2017-01-21 DIAGNOSIS — E119 Type 2 diabetes mellitus without complications: Secondary | ICD-10-CM

## 2017-01-21 DIAGNOSIS — M797 Fibromyalgia: Secondary | ICD-10-CM

## 2017-01-21 NOTE — Telephone Encounter (Signed)
Pt is calling stating that she needs a refill of her pain medication.  She would have been seen today but [name] is booked.  She states she is in a lot of pain and is in need of a refill.  She is also checking on the status of her diabetic meters.  She states that her insurance will not cover the one that was prescribed.  She states the ones the insurance will cover are the Roche accura or the One Touch.  Please advise [phone]

## 2017-01-26 NOTE — Telephone Encounter (Signed)
Patient called in on 01/21/17 for pain med refills and her diabetic meter that needs to be sent to the [facility] on [address].  Can we address this she was not happy about it taking almost a week for her to get an answer on this.

## 2017-01-27 MED ORDER — OXYCODONE-ACETAMINOPHEN 10-325 MG PO TABS: 1.0000 | ORAL_TABLET | Freq: Two times a day (BID) | ORAL | 0 refills | Status: DC | PRN

## 2017-01-27 MED ORDER — BLOOD GLUCOSE MONITOR KIT: PACK | 0 refills | Status: DC

## 2017-01-27 NOTE — Telephone Encounter (Signed)
Re-ordered. Both printed, she'll need to come get them. Not sure why there was the delay. Please apologize.  Meds ordered this encounter  Medications  . oxyCODONE-acetaminophen (PERCOCET) 10-325 MG tablet    Sig: Take 1-1.5 tablets by mouth 2 (two) times daily as needed for pain.    Dispense:  90 tablet    Refill:  0    Order Specific Question:   Supervising Provider    Answer:   SHAW, EVA N [[id number]]  . blood glucose meter kit and supplies KIT    Sig: Abbott FreeStyle Libre Flash Glucose Monitoring System. Use daily as directed. ICD-10: E11.9    Dispense:  1 each    Refill:  0    Order Specific Question:   Supervising Provider    Answer:   Brigitte Pulse, EVA N [[id number]]    Order Specific Question:   Number of strips    Answer:   100    Order Specific Question:   Number of lancets    Answer:   100

## 2017-01-31 ENCOUNTER — Telehealth: Payer: Self-pay

## 2017-01-31 NOTE — Telephone Encounter (Signed)
Pa started for sensor and monitor (freestyle libre reader device).  Should check back in 3-5 business days.  [phone] and patient's plan id # is [id number].

## 2017-02-08 NOTE — Telephone Encounter (Signed)
Called pharmacy for a test run on meter and sensor, however, they were both denied.  I then called the insurance company to find out why the monitor/sensor were being denied.  I was told that Medicare does not cover this device.  I asked them what devices were covered, and the member stated that she would need to call her part B Medicare for covered devices as part D does not cover any at all.  I asked representative if I could call for the member, however, she re-stated that the member needs to call.  I called patient to advise.

## 2017-02-08 NOTE — Telephone Encounter (Signed)
One touch ultra is the approved glucometer.  Patient states she would like this meter called in with strips, etc.  Thank you for your help!

## 2017-02-08 NOTE — Telephone Encounter (Signed)
It is up to the patient how she proceeds at this point. She can pay OOP for the glucometer that she wants, or we can send in an order for the one that is covered.

## 2017-02-08 NOTE — Telephone Encounter (Signed)
Call to patient to advise her of UHC/Medicare denial, and she states that her plan covers one tough ultra.  She does not want to use the one touch ultra, however, because it hurts her finger to prick it because she has chronic pain syndrome.  She asked if I would call them again, and I said I would.

## 2017-02-08 NOTE — Telephone Encounter (Signed)
Called back to insurance and spoke to a representative and was told that the freestyle reader and sensor are not covered regardless of patient's chronic pain syndrome.  (Please see previous messages if questions) Please advise next step. Thank you!

## 2017-02-09 ENCOUNTER — Observation Stay (HOSPITAL_COMMUNITY)
Admission: AD | Admit: 2017-02-09 | Discharge: 2017-02-11 | Disposition: A | Discharge status: Home / Self Care | Payer: Medicare Other | Source: Ambulatory Visit | Attending: Cardiovascular Disease | Admitting: Cardiovascular Disease

## 2017-02-09 DIAGNOSIS — J45909 Unspecified asthma, uncomplicated: Secondary | ICD-10-CM | POA: Insufficient documentation

## 2017-02-09 DIAGNOSIS — R072 Precordial pain: Principal | ICD-10-CM | POA: Diagnosis present

## 2017-02-09 DIAGNOSIS — E119 Type 2 diabetes mellitus without complications: Secondary | ICD-10-CM

## 2017-02-09 DIAGNOSIS — K219 Gastro-esophageal reflux disease without esophagitis: Secondary | ICD-10-CM | POA: Diagnosis not present

## 2017-02-09 DIAGNOSIS — Z950 Presence of cardiac pacemaker: Secondary | ICD-10-CM | POA: Insufficient documentation

## 2017-02-09 DIAGNOSIS — E785 Hyperlipidemia, unspecified: Secondary | ICD-10-CM | POA: Diagnosis not present

## 2017-02-09 DIAGNOSIS — Z79899 Other long term (current) drug therapy: Secondary | ICD-10-CM | POA: Insufficient documentation

## 2017-02-09 DIAGNOSIS — Z91040 Latex allergy status: Secondary | ICD-10-CM | POA: Diagnosis not present

## 2017-02-09 DIAGNOSIS — M797 Fibromyalgia: Secondary | ICD-10-CM | POA: Diagnosis present

## 2017-02-09 DIAGNOSIS — I10 Essential (primary) hypertension: Secondary | ICD-10-CM | POA: Diagnosis not present

## 2017-02-09 DIAGNOSIS — F419 Anxiety disorder, unspecified: Secondary | ICD-10-CM | POA: Diagnosis not present

## 2017-02-09 DIAGNOSIS — F329 Major depressive disorder, single episode, unspecified: Secondary | ICD-10-CM | POA: Diagnosis not present

## 2017-02-09 DIAGNOSIS — F32A Depression, unspecified: Secondary | ICD-10-CM | POA: Diagnosis present

## 2017-02-09 DIAGNOSIS — I249 Acute ischemic heart disease, unspecified: Secondary | ICD-10-CM | POA: Diagnosis present

## 2017-02-09 DIAGNOSIS — Z882 Allergy status to sulfonamides status: Secondary | ICD-10-CM | POA: Insufficient documentation

## 2017-02-09 DIAGNOSIS — Z87891 Personal history of nicotine dependence: Secondary | ICD-10-CM | POA: Diagnosis not present

## 2017-02-09 DIAGNOSIS — K760 Fatty (change of) liver, not elsewhere classified: Secondary | ICD-10-CM | POA: Diagnosis not present

## 2017-02-09 DIAGNOSIS — M1990 Unspecified osteoarthritis, unspecified site: Secondary | ICD-10-CM | POA: Diagnosis not present

## 2017-02-09 LAB — PROTIME-INR
INR: 0.98
Prothrombin Time: 12.9 seconds (ref 11.4–15.2)

## 2017-02-09 LAB — CBC WITH DIFFERENTIAL/PLATELET
Basophils Absolute: 0 10*3/uL (ref 0.0–0.1)
Basophils Relative: 0 %
EOS PCT: 1 %
Eosinophils Absolute: 0.1 10*3/uL (ref 0.0–0.7)
HEMATOCRIT: 36.1 % (ref 36.0–46.0)
Hemoglobin: 11.7 g/dL — ABNORMAL LOW (ref 12.0–15.0)
LYMPHS ABS: 3 10*3/uL (ref 0.7–4.0)
LYMPHS PCT: 47 %
MCH: 26.9 pg (ref 26.0–34.0)
MCHC: 32.4 g/dL (ref 30.0–36.0)
MCV: 83 fL (ref 78.0–100.0)
MONO ABS: 0.4 10*3/uL (ref 0.1–1.0)
Monocytes Relative: 7 %
NEUTROS ABS: 2.9 10*3/uL (ref 1.7–7.7)
Neutrophils Relative %: 45 %
PLATELETS: 300 10*3/uL (ref 150–400)
RBC: 4.35 MIL/uL (ref 3.87–5.11)
RDW: 13.6 % (ref 11.5–15.5)
WBC: 6.4 10*3/uL (ref 4.0–10.5)

## 2017-02-09 LAB — APTT: aPTT: 28 seconds (ref 24–36)

## 2017-02-09 LAB — HEMOGLOBIN A1C
Hgb A1c MFr Bld: 5.9 % — ABNORMAL HIGH (ref 4.8–5.6)
Mean Plasma Glucose: 122.63 mg/dL

## 2017-02-09 MED ORDER — HEPARIN (PORCINE) IN NACL 100-0.45 UNIT/ML-% IJ SOLN
1000.0000 [IU]/h | INTRAMUSCULAR | Status: DC
  Administered 2017-02-10: 1000 [IU]/h via INTRAVENOUS
  Filled 2017-02-09: qty 250

## 2017-02-09 MED ORDER — HEPARIN BOLUS VIA INFUSION
4000.0000 [IU] | Freq: Once | INTRAVENOUS | Status: AC
  Administered 2017-02-10: 4000 [IU] via INTRAVENOUS
  Filled 2017-02-09: qty 4000

## 2017-02-09 MED ORDER — CLOPIDOGREL BISULFATE 75 MG PO TABS
75.0000 mg | ORAL_TABLET | Freq: Every day | ORAL | Status: DC
  Administered 2017-02-10: 75 mg via ORAL
  Filled 2017-02-09 (×2): qty 1

## 2017-02-09 MED ORDER — NITROGLYCERIN 0.4 MG SL SUBL: 0.4000 mg | SUBLINGUAL_TABLET | SUBLINGUAL | Status: DC | PRN

## 2017-02-09 MED ORDER — INSULIN ASPART 100 UNIT/ML ~~LOC~~ SOLN: 0.0000 [IU] | Freq: Three times a day (TID) | SUBCUTANEOUS | Status: DC

## 2017-02-09 MED ORDER — ATORVASTATIN CALCIUM 40 MG PO TABS
40.0000 mg | ORAL_TABLET | Freq: Every day | ORAL | Status: DC
  Administered 2017-02-10: 40 mg via ORAL
  Filled 2017-02-09: qty 1

## 2017-02-09 MED ORDER — ONDANSETRON HCL 4 MG/2ML IJ SOLN: 4.0000 mg | Freq: Four times a day (QID) | INTRAMUSCULAR | Status: DC | PRN

## 2017-02-09 MED ORDER — METOPROLOL TARTRATE 12.5 MG HALF TABLET
12.5000 mg | ORAL_TABLET | Freq: Two times a day (BID) | ORAL | Status: DC
  Administered 2017-02-10 – 2017-02-11 (×4): 12.5 mg via ORAL
  Filled 2017-02-09 (×4): qty 1

## 2017-02-09 NOTE — H&P (Signed)
Referring Physician:  [name] is an 61 y.o. female.                       Chief Complaint: Chest pain  HPI: 61 year old female came to office with left precordial chest pain, off and on, lasting for few minutes to hours and with shortness of breath. She has PMH of Asthma, arthritis, Anxiety, diabetes mellitus type 2, fibromyalgia, hypertension, Irritable bowel syndrome and permanent pacemaker placement for sick sinus syndrome. She has no fever or cough.  Past Medical History:  Diagnosis Date  . Allergy   . Anal fissure 02/2012  . Anxiety   . Arthritis   . Asthma   . Bursitis    right hip  . Complication of anesthesia     woke up during a colonscopy, difficult to wake up post-op sometimes , requires lots of blankets to stay warm post op  . Depression   . Diabetes mellitus without complication ([place])    type 2 diet controlled  . Fatty liver    sees dr Collene Mares for  . Fibromyalgia   . GERD (gastroesophageal reflux disease)   . Hepatitis A infection 1979  . Hyperlipidemia   . Hypertension   . Irritable bowel syndrome (IBS)   . 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 Psoriasis    left foot  . 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 Tarsal tunnel syndrome of left side       Past Surgical History:  Procedure Laterality Date  . ABDOMINAL HYSTERECTOMY  09/1994   partial  . APPENDECTOMY    . BILATERAL SALPINGOOPHORECTOMY Bilateral 04/2011  . CARDIAC CATHETERIZATION  10/08/2007  . CESAREAN SECTION     x 1  . CHOLECYSTECTOMY N/A 02/16/2015   Procedure: LAPAROSCOPIC CHOLECYSTECTOMY WITH INTRAOPERATIVE CHOLANGIOGRAM;  Surgeon: Johnathan Hausen, MD;  Location: [facility];  Service: General;  Laterality: N/A;  . COLONOSCOPY WITH PROPOFOL N/A 08/19/2016   Procedure: COLONOSCOPY WITH PROPOFOL;  Surgeon: Carol Ada, MD;  Location: [facility];  Service: Endoscopy;  Laterality: N/A;  . CYSTOSCOPY W/ URETERAL  STENT PLACEMENT  08/04/2009  . EXAMINATION UNDER ANESTHESIA  02/17/2012   Procedure: EXAM UNDER ANESTHESIA;  Surgeon: Pedro Earls, MD;  Location: [facility];  Service: General;  Laterality: N/A;  Exam under anesthesia with lateral internal sphincterotomy  . LAPAROSCOPIC LYSIS INTESTINAL ADHESIONS  08/04/2009  . PERMANENT PACEMAKER INSERTION N/A 07/08/2014   MDT MRI compatible dual chamber pacemaker implanted by Dr Rayann Heman for symptomatic bradycardia  . SHOULDER ARTHROSCOPY W/ ROTATOR CUFF REPAIR Left 1999  . SMALL INTESTINE SURGERY    . SPHINCTEROTOMY  02/17/2012   Procedure: SPHINCTEROTOMY;  Surgeon: Pedro Earls, MD;  Location: [facility];  Service: General;  Laterality: N/A;    Family History  Problem Relation Age of Onset  . Cancer Mother        liver  . Diabetes Mother   . Hyperlipidemia Mother   . Hypertension Mother   . Stroke Mother   . Cancer Maternal Grandmother        breast  . Anesthesia problems Sister        hard to wake up post-op  . Heart disease Father   . Hyperlipidemia Brother   . HIV/AIDS Brother   . Cancer Maternal Grandfather        colon  .  Diabetes Brother   . Cancer Brother        lung  . Cancer Paternal Grandfather        COLON   Social History:  reports that she quit smoking about 4 years ago. Her smoking use included Cigarettes. She has a 18.50 pack-year smoking history. She has never used smokeless tobacco. She reports that she does not drink alcohol or use drugs.  Allergies:  Allergies  Allergen Reactions  . Codeine Other (See Comments)    HALLUCINATIONS   . Propoxyphene N-Acetaminophen Other (See Comments)    HALLUCINATIONS  . Sulfonamide Derivatives Hives, Itching and Swelling  . Alka-Seltzer [Aspirin Effervescent] Other (See Comments)    "like she's fading away", jittery   . Norvasc [Amlodipine Besylate]     "passed out"  . Red Dye     "burning and tingling sensation"  . Singulair [Montelukast Sodium]  Other (See Comments)    Increased depression, sadness  . Iohexol Nausea Only    JITTERY    . Latex Hives and Itching    Medications Prior to Admission  Medication Sig Dispense Refill  . azelastine (ASTELIN) 0.1 % nasal spray Place 2 sprays into both nostrils 2 (two) times daily. Use in each nostril as directed (Patient not taking: Reported on 11/20/2016) 30 mL 12  . Azilsartan Medoxomil (EDARBI) 40 MG TABS Take 40 mg by mouth daily.     . beclomethasone (QVAR) 40 MCG/ACT inhaler Inhale 2 puffs into the lungs 2 (two) times daily.    . blood glucose meter kit and supplies KIT Abbott FreeStyle Libre Flash Glucose Monitoring System. Use daily as directed. ICD-10: E11.9 1 each 0  . cetirizine (ZYRTEC) 10 MG tablet Take 1 tablet (10 mg total) by mouth daily. 30 tablet 11  . cholecalciferol (VITAMIN D) 1000 units tablet Take 1 tablet (1,000 Units total) by mouth daily. 90 tablet 3  . cyclobenzaprine (FLEXERIL) 10 MG tablet Take 5-10 mg by mouth daily as needed for muscle spasms.     . Desoximetasone (TOPICORT) 0.25 % ointment Apply 1 application topically 2 (two) times daily as needed (for psoriasis). 30 g 6  . dexlansoprazole (DEXILANT) 60 MG capsule Take 60 mg by mouth daily.    . diazepam (VALIUM) 5 MG tablet Take 1 tablet (5 mg total) by mouth daily as needed for anxiety or muscle spasms. 30 tablet 0  . dicyclomine (BENTYL) 10 MG capsule TAKE 1 CAPSULE (10 MG TOTAL) BY MOUTH 4 (FOUR) TIMES DAILY - BEFORE MEALS AND AT BEDTIME. 30 capsule 1  . diphenoxylate-atropine (LOMOTIL) 2.5-0.025 MG tablet Take 2 tablets by mouth 2 (two) times daily as needed for diarrhea or loose stools.     . docusate sodium (COLACE) 100 MG capsule TAKE 1 CAPSULE (100 MG TOTAL) BY MOUTH DAILY AS NEEDED FOR MILD CONSTIPATION. 30 capsule 0  . escitalopram (LEXAPRO) 20 MG tablet TAKE 1 TABLET EVERY DAY 90 tablet 3  . ezetimibe (ZETIA) 10 MG tablet Take 10 mg by mouth every evening.     . fluticasone (FLONASE) 50 MCG/ACT nasal  spray USE 2 SPRAYS INTO EACH NOSTRIL TWICE DAILY 16 g 2  . fluticasone (FLOVENT HFA) 110 MCG/ACT inhaler Inhale 1-2 puffs into the lungs 2 (two) times daily as needed (shortness of breath). 12 g 12  . ipratropium (ATROVENT) 0.03 % nasal spray USE 2 SPRAYS INTO THE NOSE EVERY 12 (TWELVE) HOURS. 30 mL 9  . loteprednol (LOTEMAX) 0.5 % ophthalmic suspension Place 1 drop into both  eyes 2 (two) times daily as needed (itching, swelling).     . meloxicam (MOBIC) 15 MG tablet TAKE 1 TABLET BY MOUTH EVERY DAY 30 tablet 3  . Multiple Vitamin (MULTIVITAMIN) tablet Take 1 tablet by mouth daily.    Marland Kitchen NITROSTAT 0.4 MG SL tablet Place 0.4 mg under the tongue every 5 (five) minutes as needed for chest pain. For chest pain.    Marland Kitchen ondansetron (ZOFRAN ODT) 4 MG disintegrating tablet Take 1 tablet (4 mg total) by mouth every 8 (eight) hours as needed for nausea. 10 tablet 0  . oxyCODONE-acetaminophen (PERCOCET) 10-325 MG tablet Take 1-1.5 tablets by mouth 2 (two) times daily as needed for pain. 90 tablet 0  . PROAIR HFA 108 (90 Base) MCG/ACT inhaler INHALE 2 PUFFS 4 TIMES A DAY AS NEEDED 8 Inhaler 0  . Probiotic Product (PROBIOTIC DAILY) CAPS Take 1 capsule by mouth 2 (two) times a week.    . ranitidine (ZANTAC) 150 MG tablet Take 150 mg by mouth as needed for heartburn (Take as directed).     . zolpidem (AMBIEN) 10 MG tablet Take 5-10 mg by mouth at bedtime as needed for sleep.       No results found for this or any previous visit (from the past 48 hour(s)). No results found.  Review Of Systems Constitutional: No fever, chills, weight loss or gain. Eyes: No vision change, wears glasses. No discharge or pain. Ears: No hearing loss, No tinnitus. Respiratory: Positive asthma, no COPD, pneumonias. Positive shortness of breath. No hemoptysis. Cardiovascular: Positive chest pain, palpitation, leg edema. Gastrointestinal: Positive nausea, vomiting, diarrhea, constipation. No GI bleed. No hepatitis. Genitourinary: No  dysuria, hematuria, kidney stone. No incontinance. Neurological: Positive headache, no stroke, seizures.  Psychiatry: No psych facility admission for anxiety, depression, suicide. No detox. Skin: No rash. Musculoskeletal: Positive joint pain, fibromyalgia. No neck pain, back pain. Lymphadenopathy: No lymphadenopathy. Hematology: No anemia or easy bruising.   Blood pressure (!) 141/77, pulse 66, temperature 97.9 F (36.6 C), temperature source Oral, resp. rate 18, height _0  (1.702 m), weight 75.7 kg (166 lb 12.8 oz), SpO2 100 %. Body mass index is 26.12 kg/m. General appearance: alert, cooperative, appears stated age and no distress Head: Normocephalic, atraumatic. Eyes: Brown eyes, pink conjunctiva, corneas clear. PERRL, EOM's intact. Neck: No adenopathy, no carotid bruit, no JVD, supple, symmetrical, trachea midline and thyroid not enlarged. Resp: Clear to auscultation bilaterally. Chest wall tender, chronically. Cardio: Regular rate and rhythm, S1, S2 normal, II/VI systolic murmur, no click, rub or gallop GI: Soft, non-tender; bowel sounds normal; no organomegaly. Extremities: No edema, cyanosis or clubbing. Skin: Warm and dry.  Neurologic: Alert and oriented X 3, normal strength. Normal coordination and gait.  Assessment/Plan Chest pain r/o MI HTN S/P permanent pacemaker placement Anxiety Fibromyalgia  Place in observation. Troponin-I x 3. Nuclear stress test in AM.   Birdie Riddle, MD  02/09/2017, 9:13 PM

## 2017-02-09 NOTE — Progress Notes (Signed)
ANTICOAGULATION CONSULT NOTE - Initial Consult  Pharmacy Consult for heparin Indication: chest pain/ACS  Allergies  Allergen Reactions  . Codeine Other (See Comments)    HALLUCINATIONS   . Propoxyphene N-Acetaminophen Other (See Comments)    HALLUCINATIONS  . Sulfonamide Derivatives Hives, Itching and Swelling  . Alka-Seltzer [Aspirin Effervescent] Other (See Comments)    "like she's fading away", jittery   . Norvasc [Amlodipine Besylate]     "passed out"  . Red Dye     "burning and tingling sensation"  . Singulair [Montelukast Sodium] Other (See Comments)    Increased depression, sadness  . Iohexol Nausea Only    JITTERY    . Latex Hives and Itching    Patient Measurements: Height: 5' 7"  (170.2 cm) Weight: 166 lb 12.8 oz (75.7 kg) (scale c) IBW/kg (Calculated) : 61.6 Heparin Dosing Weight: 75kg  Vital Signs: Temp: 97.9 F (36.6 C) (09/06 1947) Temp Source: Oral (09/06 1947) BP: 141/77 (09/06 1947) Pulse Rate: 66 (09/06 1947)  Labs: No results for input(s): HGB, HCT, PLT, APTT, LABPROT, INR, HEPARINUNFRC, HEPRLOWMOCWT, CREATININE, CKTOTAL, CKMB, TROPONINI in the last 72 hours.  CrCl cannot be calculated (Patient's most recent lab result is older than the maximum 21 days allowed.).   Medical History: Past Medical History:  Diagnosis Date  . Allergy   . Anal fissure 02/2012  . Anxiety   . Arthritis   . Asthma   . Bursitis    right hip  . Complication of anesthesia     woke up during a colonscopy, difficult to wake up post-op sometimes , requires lots of blankets to stay warm post op  . Depression   . Diabetes mellitus without complication ([place])    type 2 diet controlled  . Fatty liver    sees dr Collene Mares for  . Fibromyalgia   . GERD (gastroesophageal reflux disease)   . Hepatitis A infection 1979  . Hyperlipidemia   . Hypertension   . Irritable bowel syndrome (IBS)   . 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 Psoriasis    left foot  . 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 Tarsal tunnel syndrome of left side     Assessment: 61 year old female admitted to [facility] for r/o acs to start IV heparin. Not on anticoagulants prior to admission. Plan for stress test in am.  Goal of Therapy:  Heparin level 0.3-0.7 units/ml Monitor platelets by anticoagulation protocol: Yes   Plan:  Give 4000 units bolus x 1 Start heparin infusion at 1000 units/hr Check anti-Xa level in 6 hours and daily while on heparin Continue to monitor H&H and platelets  Erin Hearing PharmD., BCPS Clinical Pharmacist Pager [phone] 02/09/2017 9:26 PM

## 2017-02-10 ENCOUNTER — Encounter (HOSPITAL_COMMUNITY)
Admission: AD | Disposition: A | Discharge status: Home / Self Care | Payer: Self-pay | Source: Ambulatory Visit | Attending: Cardiovascular Disease

## 2017-02-10 ENCOUNTER — Observation Stay (HOSPITAL_COMMUNITY): Payer: Medicare Other

## 2017-02-10 DIAGNOSIS — R072 Precordial pain: Secondary | ICD-10-CM | POA: Diagnosis not present

## 2017-02-10 DIAGNOSIS — E119 Type 2 diabetes mellitus without complications: Secondary | ICD-10-CM | POA: Diagnosis not present

## 2017-02-10 DIAGNOSIS — J45909 Unspecified asthma, uncomplicated: Secondary | ICD-10-CM | POA: Diagnosis not present

## 2017-02-10 DIAGNOSIS — M797 Fibromyalgia: Secondary | ICD-10-CM | POA: Diagnosis not present

## 2017-02-10 HISTORY — PX: LEFT HEART CATH AND CORONARY ANGIOGRAPHY: CATH118249

## 2017-02-10 LAB — CBC
HEMATOCRIT: 36 % (ref 36.0–46.0)
HEMOGLOBIN: 11.6 g/dL — AB (ref 12.0–15.0)
MCH: 26.6 pg (ref 26.0–34.0)
MCHC: 32.2 g/dL (ref 30.0–36.0)
MCV: 82.6 fL (ref 78.0–100.0)
Platelets: 310 10*3/uL (ref 150–400)
RBC: 4.36 MIL/uL (ref 3.87–5.11)
RDW: 13.5 % (ref 11.5–15.5)
WBC: 5.7 10*3/uL (ref 4.0–10.5)

## 2017-02-10 LAB — GLUCOSE, CAPILLARY
GLUCOSE-CAPILLARY: 95 mg/dL (ref 65–99)
GLUCOSE-CAPILLARY: 165 mg/dL — AB (ref 65–99)
Glucose-Capillary: 139 mg/dL — ABNORMAL HIGH (ref 65–99)
Glucose-Capillary: 76 mg/dL (ref 65–99)
Glucose-Capillary: 98 mg/dL (ref 65–99)

## 2017-02-10 LAB — COMPREHENSIVE METABOLIC PANEL
ALBUMIN: 3.7 g/dL (ref 3.5–5.0)
ALK PHOS: 46 U/L (ref 38–126)
ALT: 54 U/L (ref 14–54)
AST: 34 U/L (ref 15–41)
Anion gap: 10 (ref 5–15)
BILIRUBIN TOTAL: 0.6 mg/dL (ref 0.3–1.2)
BUN: 8 mg/dL (ref 6–20)
CALCIUM: 9.2 mg/dL (ref 8.9–10.3)
CO2: 25 mmol/L (ref 22–32)
CREATININE: 0.67 mg/dL (ref 0.44–1.00)
Chloride: 107 mmol/L (ref 101–111)
GFR calc Af Amer: >60 mL/min (ref >60)
GFR calc non Af Amer: >60 mL/min (ref >60)
GLUCOSE: 117 mg/dL — AB (ref 65–99)
Potassium: 3.7 mmol/L (ref 3.5–5.1)
SODIUM: 142 mmol/L (ref 135–145)
TOTAL PROTEIN: 6.6 g/dL (ref 6.5–8.1)

## 2017-02-10 LAB — LIPID PANEL
Cholesterol: 154 mg/dL (ref 0–200)
HDL: 30 mg/dL — AB (ref >40)
LDL Cholesterol: 103 mg/dL — ABNORMAL HIGH (ref 0–99)
TRIGLYCERIDES: 104 mg/dL (ref <150)
Total CHOL/HDL Ratio: 5.1 RATIO
VLDL: 21 mg/dL (ref 0–40)

## 2017-02-10 LAB — HIV ANTIBODY (ROUTINE TESTING W REFLEX): HIV Screen 4th Generation wRfx: NONREACTIVE

## 2017-02-10 LAB — BASIC METABOLIC PANEL
ANION GAP: 8 (ref 5–15)
BUN: 9 mg/dL (ref 6–20)
CALCIUM: 8.9 mg/dL (ref 8.9–10.3)
CO2: 25 mmol/L (ref 22–32)
Chloride: 106 mmol/L (ref 101–111)
Creatinine, Ser: 0.64 mg/dL (ref 0.44–1.00)
GFR calc Af Amer: >60 mL/min (ref >60)
Glucose, Bld: 103 mg/dL — ABNORMAL HIGH (ref 65–99)
Potassium: 3.9 mmol/L (ref 3.5–5.1)
SODIUM: 139 mmol/L (ref 135–145)

## 2017-02-10 LAB — TROPONIN I
Troponin I: <0.03 ng/mL (ref <0.03)
Troponin I: <0.03 ng/mL (ref <0.03)

## 2017-02-10 LAB — POCT ACTIVATED CLOTTING TIME: ACTIVATED CLOTTING TIME: 120 s

## 2017-02-10 SURGERY — LEFT HEART CATH AND CORONARY ANGIOGRAPHY
Anesthesia: LOCAL

## 2017-02-10 MED ORDER — SODIUM CHLORIDE 0.9 % IV SOLN: 250.0000 mL | INTRAVENOUS | Status: DC | PRN

## 2017-02-10 MED ORDER — SODIUM CHLORIDE 0.9% FLUSH: 3.0000 mL | INTRAVENOUS | Status: DC | PRN

## 2017-02-10 MED ORDER — TECHNETIUM TC 99M TETROFOSMIN IV KIT
10.0000 | PACK | Freq: Once | INTRAVENOUS | Status: AC | PRN
  Administered 2017-02-10: 10 via INTRAVENOUS

## 2017-02-10 MED ORDER — REGADENOSON 0.4 MG/5ML IV SOLN
INTRAVENOUS | Status: AC
  Filled 2017-02-10: qty 5

## 2017-02-10 MED ORDER — HEPARIN (PORCINE) IN NACL 2-0.9 UNIT/ML-% IJ SOLN
INTRAMUSCULAR | Status: AC
  Filled 2017-02-10: qty 1000

## 2017-02-10 MED ORDER — FAMOTIDINE IN NACL 20-0.9 MG/50ML-% IV SOLN
INTRAVENOUS | Status: AC
  Filled 2017-02-10: qty 50

## 2017-02-10 MED ORDER — FAMOTIDINE IN NACL 20-0.9 MG/50ML-% IV SOLN
INTRAVENOUS | Status: AC | PRN
  Administered 2017-02-10: 20 mg via INTRAVENOUS

## 2017-02-10 MED ORDER — SODIUM CHLORIDE 0.9% FLUSH: 3.0000 mL | Freq: Two times a day (BID) | INTRAVENOUS | Status: DC

## 2017-02-10 MED ORDER — MIDAZOLAM HCL 2 MG/2ML IJ SOLN
INTRAMUSCULAR | Status: DC | PRN
  Administered 2017-02-10: 1 mg via INTRAVENOUS

## 2017-02-10 MED ORDER — FENTANYL CITRATE (PF) 100 MCG/2ML IJ SOLN
INTRAMUSCULAR | Status: AC
  Filled 2017-02-10: qty 2

## 2017-02-10 MED ORDER — MIDAZOLAM HCL 2 MG/2ML IJ SOLN
INTRAMUSCULAR | Status: AC
  Filled 2017-02-10: qty 2

## 2017-02-10 MED ORDER — METHYLPREDNISOLONE SODIUM SUCC 125 MG IJ SOLR
INTRAMUSCULAR | Status: AC
  Filled 2017-02-10: qty 2

## 2017-02-10 MED ORDER — SODIUM CHLORIDE 0.9 % IV SOLN: INTRAVENOUS | Status: AC

## 2017-02-10 MED ORDER — TECHNETIUM TC 99M TETROFOSMIN IV KIT
30.0000 | PACK | Freq: Once | INTRAVENOUS | Status: AC | PRN
  Administered 2017-02-10: 30 via INTRAVENOUS

## 2017-02-10 MED ORDER — LIDOCAINE HCL 2 % IJ SOLN
INTRAMUSCULAR | Status: AC
  Filled 2017-02-10: qty 10

## 2017-02-10 MED ORDER — IOPAMIDOL (ISOVUE-370) INJECTION 76%
INTRAVENOUS | Status: DC | PRN
  Administered 2017-02-10: 50 mL via INTRA_ARTERIAL

## 2017-02-10 MED ORDER — DIPHENHYDRAMINE HCL 50 MG/ML IJ SOLN
INTRAMUSCULAR | Status: AC
  Filled 2017-02-10: qty 1

## 2017-02-10 MED ORDER — METHYLPREDNISOLONE SODIUM SUCC 125 MG IJ SOLR: 125.0000 mg | Freq: Once | INTRAMUSCULAR | Status: DC

## 2017-02-10 MED ORDER — HYDROMORPHONE HCL 1 MG/ML IJ SOLN
INTRAMUSCULAR | Status: AC
  Filled 2017-02-10: qty 1

## 2017-02-10 MED ORDER — SODIUM CHLORIDE 0.9 % IV SOLN
250.0000 mL | INTRAVENOUS | Status: DC | PRN
Start: 2017-02-10 — End: 2017-02-10

## 2017-02-10 MED ORDER — SODIUM CHLORIDE 0.9 % IV SOLN
INTRAVENOUS | Status: DC
  Administered 2017-02-10: 100 mL/h via INTRAVENOUS

## 2017-02-10 MED ORDER — HEPARIN (PORCINE) IN NACL 2-0.9 UNIT/ML-% IJ SOLN
INTRAMUSCULAR | Status: AC | PRN
  Administered 2017-02-10: 1000 mL

## 2017-02-10 MED ORDER — IOPAMIDOL (ISOVUE-370) INJECTION 76%
INTRAVENOUS | Status: AC
  Filled 2017-02-10: qty 100

## 2017-02-10 MED ORDER — HYDROMORPHONE HCL 1 MG/ML IJ SOLN
1.0000 mg | Freq: Once | INTRAMUSCULAR | Status: AC
  Administered 2017-02-10: 1 mg via INTRAVENOUS

## 2017-02-10 MED ORDER — FENTANYL CITRATE (PF) 100 MCG/2ML IJ SOLN
INTRAMUSCULAR | Status: DC | PRN
  Administered 2017-02-10: 25 ug via INTRAVENOUS

## 2017-02-10 MED ORDER — LIDOCAINE HCL (PF) 1 % IJ SOLN
INTRAMUSCULAR | Status: DC | PRN
  Administered 2017-02-10: 20 mL

## 2017-02-10 MED ORDER — FAMOTIDINE IN NACL 20-0.9 MG/50ML-% IV SOLN: 20.0000 mg | Freq: Once | INTRAVENOUS | Status: DC

## 2017-02-10 MED ORDER — OXYCODONE-ACETAMINOPHEN 5-325 MG PO TABS
1.5000 | ORAL_TABLET | Freq: Four times a day (QID) | ORAL | Status: DC | PRN
  Administered 2017-02-10 (×2): 1.5 via ORAL
  Filled 2017-02-10 (×2): qty 2

## 2017-02-10 MED ORDER — DIPHENHYDRAMINE HCL 50 MG/ML IJ SOLN: 25.0000 mg | Freq: Once | INTRAMUSCULAR | Status: DC

## 2017-02-10 MED ORDER — HEPARIN SODIUM (PORCINE) 1000 UNIT/ML IJ SOLN
INTRAMUSCULAR | Status: AC
  Filled 2017-02-10: qty 1

## 2017-02-10 MED ORDER — METHYLPREDNISOLONE SODIUM SUCC 125 MG IJ SOLR
INTRAMUSCULAR | Status: DC | PRN
  Administered 2017-02-10: 125 mg via INTRAVENOUS

## 2017-02-10 MED ORDER — DIPHENHYDRAMINE HCL 50 MG/ML IJ SOLN
INTRAMUSCULAR | Status: DC | PRN
  Administered 2017-02-10: 25 mg via INTRAVENOUS

## 2017-02-10 SURGICAL SUPPLY — 9 items
CATH INFINITI 5FR MULTPACK ANG (CATHETERS) ×1 IMPLANT
KIT HEART LEFT (KITS) ×2 IMPLANT
NDL SMART REG 18GX2-3/4 (NEEDLE) IMPLANT
NEEDLE SMART REG 18GX2-3/4 (NEEDLE) ×2 IMPLANT
PACK CARDIAC CATHETERIZATION (CUSTOM PROCEDURE TRAY) ×2 IMPLANT
SHEATH PINNACLE 5F 10CM (SHEATH) ×1 IMPLANT
SYR MEDRAD MARK V 150ML (SYRINGE) ×2 IMPLANT
TRANSDUCER W/STOPCOCK (MISCELLANEOUS) ×2 IMPLANT
WIRE EMERALD 3MM-J .035X150CM (WIRE) ×1 IMPLANT

## 2017-02-10 NOTE — Progress Notes (Signed)
Site area: RFA Site Prior to Removal:  Level  Pressure Applied For:20 min Manual:yes    Patient Status During Pull:  stable Post Pull Site:  Level 0 Post Pull Instructions Given: yes  Post Pull Pulses Present: palpable Dressing Applied:  tegaderm Bedrest begins @ 9828 till 1915 Comments:

## 2017-02-10 NOTE — Progress Notes (Signed)
Patient had coughing spell and had small amount of vaginal bleeding. She stated she is post-menopausal.  Vital signs stable and recorded in flowsheet.  Skin warm and dry.  She was on Heparin drip and it was d/c'd post cardiac cath.  Dr. Doylene Canard informed via telephone of all above and he confirmed he has already ordered BMET and CBC for morning.

## 2017-02-10 NOTE — Progress Notes (Signed)
Patient returned from stress test and requested food.  Dr. Doylene Canard consulted and he stated to keep her NPO for couple of hours until test results read.  Patient updated on plan of care.

## 2017-02-10 NOTE — Interval H&P Note (Signed)
History and Physical Interval Note:  02/10/2017 1:25 PM  [name]  has presented today for surgery, with the diagnosis of abnormal stress test  The various methods of treatment have been discussed with the patient and family. After consideration of risks, benefits and other options for treatment, the patient has consented to  Procedure(s): LEFT HEART CATH AND CORONARY ANGIOGRAPHY (N/A) as a surgical intervention .  The patient's history has been reviewed, patient examined, no change in status, stable for surgery.  I have reviewed the patient's chart and labs.  Questions were answered to the patient's satisfaction.     Denesia Donelan S

## 2017-02-10 NOTE — Progress Notes (Signed)
Heparin stopped pre-cath.  Tranportation here from cath lab.

## 2017-02-10 NOTE — Progress Notes (Signed)
Patient returned from cardiac cathter.  Right groin = level 0.  Patient alert and oriented x 4.  Precautions reviewed with patient for post cath care.

## 2017-02-11 DIAGNOSIS — M797 Fibromyalgia: Secondary | ICD-10-CM | POA: Diagnosis not present

## 2017-02-11 DIAGNOSIS — J45909 Unspecified asthma, uncomplicated: Secondary | ICD-10-CM | POA: Diagnosis not present

## 2017-02-11 DIAGNOSIS — E119 Type 2 diabetes mellitus without complications: Secondary | ICD-10-CM | POA: Diagnosis not present

## 2017-02-11 DIAGNOSIS — R072 Precordial pain: Secondary | ICD-10-CM | POA: Diagnosis not present

## 2017-02-11 LAB — BASIC METABOLIC PANEL
ANION GAP: 8 (ref 5–15)
BUN: 9 mg/dL (ref 6–20)
CALCIUM: 9.1 mg/dL (ref 8.9–10.3)
CO2: 22 mmol/L (ref 22–32)
Chloride: 109 mmol/L (ref 101–111)
Creatinine, Ser: 0.65 mg/dL (ref 0.44–1.00)
GFR calc Af Amer: >60 mL/min (ref >60)
GLUCOSE: 141 mg/dL — AB (ref 65–99)
Potassium: 4.1 mmol/L (ref 3.5–5.1)
SODIUM: 139 mmol/L (ref 135–145)

## 2017-02-11 LAB — CBC
HCT: 38.3 % (ref 36.0–46.0)
Hemoglobin: 12.5 g/dL (ref 12.0–15.0)
MCH: 26.8 pg (ref 26.0–34.0)
MCHC: 32.6 g/dL (ref 30.0–36.0)
MCV: 82 fL (ref 78.0–100.0)
PLATELETS: 295 10*3/uL (ref 150–400)
RBC: 4.67 MIL/uL (ref 3.87–5.11)
RDW: 13.4 % (ref 11.5–15.5)
WBC: 8.5 10*3/uL (ref 4.0–10.5)

## 2017-02-11 LAB — GLUCOSE, CAPILLARY: Glucose-Capillary: 105 mg/dL — ABNORMAL HIGH (ref 65–99)

## 2017-02-11 MED ORDER — METOPROLOL TARTRATE 25 MG PO TABS: 12.5000 mg | ORAL_TABLET | Freq: Two times a day (BID) | ORAL | 3 refills | Status: DC

## 2017-02-11 NOTE — Discharge Summary (Signed)
Physician Discharge Summary  Patient ID: [name] MRN: [id number] DOB/AGE: 12-10-1955 61 y.o.  Admit date: 02/09/2017 Discharge date: 02/11/2017  Admission Diagnoses: Chest pain rule out MI Hypertension Status post permanent pacemaker placement Anxiety Fibromyalgia  Discharge Diagnoses:  Principal Problem:   Chest pain, precordial, musculoskeletal Active Problems:   Anxiety and depression   GERD   Fibromyalgia   Diabetes mellitus type 2, controlled, without complications ([facility])   HTN (hypertension)   Hyperlipidemia   S/P Permanent Pacemaker placement    Discharged Condition: fair  Hospital Course: 61 year old female came to the office with a left precordial chest pain going on for a few days lasting for a few minutes to hours with shortness of breath. She underwent nuclear stress test showing reversible ischemia and anteroseptal apical wall. Patient underwent cardiac catheterization that showed normal coronaries. She remained stable post procedure and was discharged home in satisfactory condition with follow-up by me in 1 week. She had a small vaginal bleed for which she will see her gynecologist on outpatient basis.  Consults: cardiology  Significant Diagnostic Studies: labs: Be met was unremarkable except for a blood glucose of 103-141 mg. CBC was essentially unremarkable. LDL cholesterol was 103 mg and HDL cholesterol was 30 mg with a total cholesterol of 254 mg. Troponin I was normal 2. Hemoglobin A1c was 5.9.  EKG showed normal sinus rhythm with anterior and inferior wall ischemia.  Myocardial imaging with up perfusion scan showed a small focus of reversible ischemia and anteroseptal appendix and ejection fraction of 58%.  Cardiac catheterization showed normal coronaries and normal LV systolic function  Treatments: cardiac meds: Ezetimibe, metoprolol and Edarbi (Azilsartan).  Discharge Exam: Blood pressure (!) 145/86, pulse 83, temperature 97.9 F (36.6 C),  temperature source Oral, resp. rate 18, height 5' 7"  (1.702 m), weight 74.9 kg (165 lb 3.2 oz), SpO2 99 %. General appearance: alert, cooperative and appears stated age. Head: Normocephalic, atraumatic. Eyes: Brown eyes, pink conjunctiva, corneas clear. PERRL, EOM's intact.  Neck: No adenopathy, no carotid bruit, no JVD, supple, symmetrical, trachea midline and thyroid not enlarged. Resp: Clear to auscultation bilaterally. Cardio: Regular rate and rhythm, S1, S2 normal, II/VI systolic murmur, no click, rub or gallop. GI: Soft, non-tender; bowel sounds normal; no organomegaly. Extremities: No edema, cyanosis or clubbing. No hematoma. Skin: Warm and dry.  Neurologic: Alert and oriented X 3, normal strength and tone. Normal coordination and gait.  Disposition: 01-Home or Self Care   Allergies as of 02/11/2017      Reactions   Codeine Other (See Comments)   HALLUCINATIONS   Propoxyphene N-acetaminophen Other (See Comments)   HALLUCINATIONS   Sulfonamide Derivatives Hives, Itching, Swelling   Alka-seltzer [aspirin Effervescent] Other (See Comments)   "like she's fading away", jittery    Norvasc [amlodipine Besylate] Other (See Comments)   "passed out"   Red Dye Other (See Comments)   "burning and tingling sensation"   Singulair [montelukast Sodium] Other (See Comments)   Increased depression, sadness   Iohexol Nausea Only   JITTERY    Latex Hives, Itching      Medication List    STOP taking these medications   azelastine 0.1 % nasal spray Commonly known as:  ASTELIN   blood glucose meter kit and supplies Kit   fluticasone 110 MCG/ACT inhaler Commonly known as:  FLOVENT HFA   ipratropium 0.03 % nasal spray Commonly known as:  ATROVENT     TAKE these medications   beclomethasone 40 MCG/ACT inhaler Commonly known  as:  QVAR Inhale 2 puffs into the lungs 2 (two) times daily as needed (for shortness of breath).   cetirizine 10 MG tablet Commonly known as:  ZYRTEC Take 1  tablet (10 mg total) by mouth daily.   cholecalciferol 1000 units tablet Commonly known as:  VITAMIN D Take 1 tablet (1,000 Units total) by mouth daily.   ciprofloxacin 500 MG tablet Commonly known as:  CIPRO Take 500 mg by mouth 2 (two) times daily as needed (for diverticulitis).   cyclobenzaprine 10 MG tablet Commonly known as:  FLEXERIL Take 10 mg by mouth 3 (three) times daily as needed for muscle spasms.   Desoximetasone 0.25 % ointment Commonly known as:  TOPICORT Apply 1 application topically 2 (two) times daily as needed (for psoriasis).   dexlansoprazole 60 MG capsule Commonly known as:  DEXILANT Take 60 mg by mouth daily as needed (for acid reflux).   diazepam 5 MG tablet Commonly known as:  VALIUM Take 1 tablet (5 mg total) by mouth daily as needed for anxiety or muscle spasms.   dicyclomine 10 MG capsule Commonly known as:  BENTYL TAKE 1 CAPSULE (10 MG TOTAL) BY MOUTH 4 (FOUR) TIMES DAILY - BEFORE MEALS AND AT BEDTIME. What changed:  when to take this  reasons to take this   diphenoxylate-atropine 2.5-0.025 MG tablet Commonly known as:  LOMOTIL Take 2 tablets by mouth 4 (four) times daily as needed for diarrhea or loose stools.   docusate sodium 100 MG capsule Commonly known as:  COLACE TAKE 1 CAPSULE (100 MG TOTAL) BY MOUTH DAILY AS NEEDED FOR MILD CONSTIPATION.   EDARBI 40 MG Tabs Generic drug:  Azilsartan Medoxomil Take 40 mg by mouth daily.   escitalopram 20 MG tablet Commonly known as:  LEXAPRO TAKE 1 TABLET EVERY DAY What changed:  See the new instructions.   ezetimibe 10 MG tablet Commonly known as:  ZETIA Take 10 mg by mouth every evening.   fluticasone 50 MCG/ACT nasal spray Commonly known as:  FLONASE USE 2 SPRAYS INTO EACH NOSTRIL TWICE DAILY What changed:  how much to take  how to take this  when to take this  additional instructions   meloxicam 15 MG tablet Commonly known as:  MOBIC TAKE 1 TABLET BY MOUTH EVERY DAY What  changed:  See the new instructions.   metoprolol tartrate 25 MG tablet Commonly known as:  LOPRESSOR Take 0.5 tablets (12.5 mg total) by mouth 2 (two) times daily.   metroNIDAZOLE 250 MG tablet Commonly known as:  FLAGYL Take 250 mg by mouth 3 (three) times daily as needed (for diverticulitis).   multivitamin tablet Take 1 tablet by mouth daily.   NITROSTAT 0.4 MG SL tablet Generic drug:  nitroGLYCERIN Place 0.4 mg under the tongue every 5 (five) minutes as needed for chest pain. For chest pain.   olopatadine 0.1 % ophthalmic solution Commonly known as:  PATANOL Place 1 drop into both eyes 2 (two) times daily.   ondansetron 4 MG disintegrating tablet Commonly known as:  ZOFRAN ODT Take 1 tablet (4 mg total) by mouth every 8 (eight) hours as needed for nausea.   oxyCODONE-acetaminophen 10-325 MG tablet Commonly known as:  PERCOCET Take 1-1.5 tablets by mouth 2 (two) times daily as needed for pain. What changed:  how much to take  when to take this   PROAIR HFA 108 (90 Base) MCG/ACT inhaler Generic drug:  albuterol INHALE 2 PUFFS 4 TIMES A DAY AS NEEDED What changed:  See  the new instructions.   PROBIOTIC DAILY Caps Take 1 capsule by mouth daily.   ranitidine 150 MG tablet Commonly known as:  ZANTAC Take 150 mg by mouth daily as needed for heartburn.   zolpidem 10 MG tablet Commonly known as:  AMBIEN Take 10 mg by mouth at bedtime as needed for sleep.            Discharge Care Instructions        Start     Ordered   02/11/17 0000  metoprolol tartrate (LOPRESSOR) 25 MG tablet  2 times daily     02/11/17 2890     Follow-up Information    [name], PA-C. Schedule an appointment as soon as possible for a visit in 1 month(s).   Specialty:  Family Medicine Contact information: [address] [place] [address] [phone]        Dixie Dials, MD. Schedule an appointment as soon as possible for a visit in 1 week(s).   Specialty:   Cardiology Contact information: [place] [address] [phone]           Signed: Birdie Riddle 02/11/2017, 8:33 AM

## 2017-02-11 NOTE — Progress Notes (Signed)
Reviewed all discharge instructions with patient and that her new prescription for Metoprolol has been called into her pharmacy.  Patient stated understanding of all.  No voiced complaints.

## 2017-02-13 ENCOUNTER — Telehealth: Payer: Self-pay | Admitting: Cardiology

## 2017-02-13 ENCOUNTER — Encounter (HOSPITAL_COMMUNITY): Payer: Self-pay | Admitting: Cardiovascular Disease

## 2017-02-13 ENCOUNTER — Ambulatory Visit (INDEPENDENT_AMBULATORY_CARE_PROVIDER_SITE_OTHER): Payer: Medicare Other | Admitting: *Deleted

## 2017-02-13 DIAGNOSIS — I495 Sick sinus syndrome: Secondary | ICD-10-CM | POA: Diagnosis not present

## 2017-02-13 NOTE — Telephone Encounter (Signed)
Please contact her pharmacy. I believe that I have previously sent the One Touch Ultra and testing supplies. If not, please order it/them for her. #100 strips, lancets, etc. With 3 refills.

## 2017-02-13 NOTE — Telephone Encounter (Signed)
Please advise 

## 2017-02-13 NOTE — Progress Notes (Signed)
Remote pacemaker transmission.   

## 2017-02-13 NOTE — Telephone Encounter (Signed)
LMOVM reminding pt to send remote transmission.   

## 2017-02-14 LAB — CUP PACEART REMOTE DEVICE CHECK
Battery Voltage: 3.01 V
Brady Statistic AS VP Percent: 0 %
Brady Statistic RA Percent Paced: 99.01 %
Date Time Interrogation Session: 20180910183139
Implantable Lead Implant Date: 20160202
Implantable Lead Location: 753860
Implantable Lead Model: 5076
Implantable Pulse Generator Implant Date: 20160202
Lead Channel Impedance Value: 399 Ohm
Lead Channel Impedance Value: 418 Ohm
Lead Channel Pacing Threshold Amplitude: 0.375 V
Lead Channel Pacing Threshold Pulse Width: 0.4 ms
Lead Channel Pacing Threshold Pulse Width: 0.4 ms
Lead Channel Sensing Intrinsic Amplitude: 26 mV
Lead Channel Sensing Intrinsic Amplitude: 26 mV
Lead Channel Sensing Intrinsic Amplitude: 3.375 mV
Lead Channel Setting Pacing Amplitude: 2 V
Lead Channel Setting Pacing Pulse Width: 0.4 ms
MDC IDC LEAD IMPLANT DT: 20160202
MDC IDC LEAD LOCATION: 753859
MDC IDC MSMT BATTERY REMAINING LONGEVITY: 90 mo
MDC IDC MSMT LEADCHNL RA IMPEDANCE VALUE: 475 Ohm
MDC IDC MSMT LEADCHNL RA SENSING INTR AMPL: 3.375 mV
MDC IDC MSMT LEADCHNL RV IMPEDANCE VALUE: 513 Ohm
MDC IDC MSMT LEADCHNL RV PACING THRESHOLD AMPLITUDE: 1 V
MDC IDC SET LEADCHNL RV PACING AMPLITUDE: 2.5 V
MDC IDC SET LEADCHNL RV SENSING SENSITIVITY: 2.8 mV
MDC IDC STAT BRADY AP VP PERCENT: 0.09 %
MDC IDC STAT BRADY AP VS PERCENT: 99.02 %
MDC IDC STAT BRADY AS VS PERCENT: 0.89 %
MDC IDC STAT BRADY RV PERCENT PACED: 0.09 %

## 2017-02-15 ENCOUNTER — Encounter: Payer: Self-pay | Admitting: Cardiology

## 2017-02-15 ENCOUNTER — Telehealth: Payer: Self-pay

## 2017-02-15 ENCOUNTER — Other Ambulatory Visit: Payer: Self-pay

## 2017-02-15 NOTE — Telephone Encounter (Signed)
Test glucose one time daily.

## 2017-02-15 NOTE — Telephone Encounter (Signed)
I attempted to call her pharmacy twice today and no one answered. Also, how often is she suppose to test.

## 2017-02-23 ENCOUNTER — Telehealth: Payer: Self-pay | Admitting: Family Medicine

## 2017-02-23 ENCOUNTER — Ambulatory Visit (INDEPENDENT_AMBULATORY_CARE_PROVIDER_SITE_OTHER): Payer: Medicare Other | Admitting: Family Medicine

## 2017-02-23 ENCOUNTER — Encounter: Payer: Self-pay | Admitting: Family Medicine

## 2017-02-23 VITALS — BP 104/67 | HR 70 | Temp 97.6°F | Resp 17 | Ht 67.0 in | Wt 163.4 lb

## 2017-02-23 DIAGNOSIS — B370 Candidal stomatitis: Secondary | ICD-10-CM

## 2017-02-23 DIAGNOSIS — I495 Sick sinus syndrome: Secondary | ICD-10-CM

## 2017-02-23 MED ORDER — MAGIC MOUTHWASH: 5.0000 mL | Freq: Three times a day (TID) | ORAL | 0 refills | Status: AC

## 2017-02-23 NOTE — Patient Instructions (Addendum)
Swish and swallow every 8 hours for thrush If no improvement in 7 days return to clinic   IF you received an x-ray today, you will receive an invoice from [facility]. Please contact [facility] at [phone] with questions or concerns regarding your invoice.   IF you received labwork today, you will receive an invoice from [place]. Please contact [facility] at [phone] with questions or concerns regarding your invoice.   Our billing staff will not be able to assist you with questions regarding bills from these companies.  You will be contacted with the lab results as soon as they are available. The fastest way to get your results is to activate your My Chart account. Instructions are located on the last page of this paperwork. If you have not heard from [place] regarding the results in 2 weeks, please contact this office.     Oral Thrush, Adult Oral thrush, also called oral candidiasis, is a fungal infection that develops in the mouth and throat and on the tongue. It causes white patches to form on the mouth and tongue. Ritta Slot is most common in older adults, but it can occur at any age. Many cases of thrush are mild, but this infection can also be serious. Ritta Slot can be a repeated (recurrent) problem for certain people who have a weak body defense system (immune system). The weakness can be caused by chronic illnesses, or by taking medicines that limit the body's ability to fight infection. If a person has difficulty fighting infection, the fungus that causes thrush can spread through the body. This can cause life-threatening blood or organ infections. What are the causes? This condition is caused by a fungus (yeast) called Candida albicans.  This fungus is normally present in small amounts in the mouth and on other mucous membranes. It usually causes no harm.  If conditions are present that allow the fungus to grow without control, it invades surrounding tissues and  becomes an infection.  Other Candida species can also lead to thrush (rare).  What increases the risk? This condition is more likely to develop in:  People with a weakened immune system.  Older adults.  People with HIV (human immunodeficiency virus).  People with diabetes.  People with dry mouth (xerostomia).  Pregnant women.  People with poor dental care, especially people who have false teeth.  People who use antibiotic medicines.  What are the signs or symptoms? Symptoms of this condition can vary from mild and moderate to severe and persistent. Symptoms may include:  A burning feeling in the mouth and throat. This can occur at the start of a thrush infection.  White patches that stick to the mouth and tongue. The tissue around the patches may be red, raw, and painful. If rubbed (during tooth brushing, for example), the patches and the tissue of the mouth may bleed easily.  A bad taste in the mouth or difficulty tasting foods.  A cottony feeling in the mouth.  Pain during eating and swallowing.  Poor appetite.  Cracking at the corners of the mouth.  How is this diagnosed? This condition is diagnosed based on:  Physical exam. Your health care provider will look in your mouth.  Health history. Your health care provider will ask you questions about your health.  How is this treated? This condition is treated with medicines called antifungals, which prevent the growth of fungi. These medicines are either applied directly to the affected area (topical) or swallowed (oral). The treatment will depend on the  severity of the condition. Mild thrush Mild cases of thrush may clear up with the use of an antifungal mouth rinse or lozenges. Treatment usually lasts about 14 days. Moderate to severe thrush  More severe thrush infections that have spread to the esophagus are treated with an oral antifungal medicine. A topical antifungal medicine may also be used.  For some  severe infections, treatment may need to continue for more than 14 days.  Oral antifungal medicines are rarely used during pregnancy because they may be harmful to the unborn child. If you are pregnant, talk with your health care provider about options for treatment. Persistent or recurrent thrush For cases of thrush that do not go away or keep coming back:  Treatment may be needed twice as long as the symptoms last.  Treatment will include both oral and topical antifungal medicines.  People with a weakened immune system can take an antifungal medicine on a continuous basis to prevent thrush infections.  It is important to treat conditions that make a person more likely to get thrush, such as diabetes or HIV. Follow these instructions at home: Medicines  Take over-the-counter and prescription medicines only as told by your health care provider.  Talk with your health care provider about an over-the-counter medicine called gentian violet, which kills bacteria and fungi. Relieving soreness and discomfort To help reduce the discomfort of thrush:  Drink cold liquids such as water or iced tea.  Try flavored ice treats or frozen juices.  Eat foods that are easy to swallow, such as gelatin, ice cream, or custard.  Try drinking from a straw if the patches in your mouth are painful.  General instructions  Eat plain, unflavored yogurt as directed by your health care provider. Check the label to make sure the yogurt contains live cultures. This yogurt can help healthy bacteria to grow in the mouth and can stop the growth of the fungus that causes thrush.  If you wear dentures, remove the dentures before going to bed, brush them vigorously, and soak them in a cleaning solution as directed by your health care provider.  Rinse your mouth with a warm salt-water mixture several times a day. To make a salt-water mixture, completely dissolve 1/2-1 tsp of salt in 1 cup of warm water. Contact a  health care provider if:  Your symptoms are getting worse or are not improving within 7 days of starting treatment.  You have symptoms of a spreading infection, such as white patches on the skin outside of the mouth. This information is not intended to replace advice given to you by your health care provider. Make sure you discuss any questions you have with your health care provider. Document Released: 02/16/2004 Document Revised: 02/15/2016 Document Reviewed: 02/15/2016 Elsevier Interactive Patient Education  2017 [employer].

## 2017-02-23 NOTE — Telephone Encounter (Signed)
Answering service call  Patient called answering service with concerns of coated tongue, difficulty swallowing, difficulty eating, fever. AKL:[id number], [name], PA-C  Sounds uncomfortable, but talking normally. We'll have her seen this morning. Will schedule appointment for any provider as her primary care provider is not in office today.

## 2017-02-23 NOTE — Progress Notes (Signed)
Chief Complaint  Patient presents with  . Thrush    on tongue, onset: 02/20/17, pt c/o tongue hurting, thick white build up on tongue  . Spasms    throat, onset: 02/20/17, pt unable to eat or swallow due to pain    HPI   Pt reports that she was treated with antibiotics recently and was treated with cipro flagyl for diverticulitis and now has "cheese coated tongue" She reports that she also has pain when she swallows with swollen glands She states that the pack of her throat is sore as well She state that she has esophageal spasms that makes her hiccup She states that swallowing her saliva hurts   Patient has a history of failing a stress test and was taking to the cath lab for acs and sick sinus syndrome She now has a pacemaker She reports that her cath was negative She now has thrush She is on a beta blocker  Past Medical History:  Diagnosis Date  . Allergy   . Anal fissure 02/2012  . Anxiety   . Arthritis   . Asthma   . Bursitis    right hip  . Complication of anesthesia     woke up during a colonscopy, difficult to wake up post-op sometimes , requires lots of blankets to stay warm post op  . Depression   . Diabetes mellitus without complication ([place])    type 2 diet controlled  . Fatty liver    sees dr Collene Mares for  . Fibromyalgia   . GERD (gastroesophageal reflux disease)   . Hepatitis A infection 1979  . Hyperlipidemia   . Hypertension   . Irritable bowel syndrome (IBS)   . 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 Psoriasis    left foot  . 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 Tarsal tunnel syndrome of left side     Current Outpatient Prescriptions  Medication Sig Dispense Refill  . Azilsartan Medoxomil (EDARBI) 40 MG TABS Take 40 mg by mouth daily.     . beclomethasone (QVAR) 40 MCG/ACT inhaler Inhale 2 puffs into the lungs 2 (two) times daily as needed (for shortness of  breath).     . cetirizine (ZYRTEC) 10 MG tablet Take 1 tablet (10 mg total) by mouth daily. 30 tablet 11  . cholecalciferol (VITAMIN D) 1000 units tablet Take 1 tablet (1,000 Units total) by mouth daily. 90 tablet 3  . ciprofloxacin (CIPRO) 500 MG tablet Take 500 mg by mouth 2 (two) times daily as needed (for diverticulitis).    . cyclobenzaprine (FLEXERIL) 10 MG tablet Take 10 mg by mouth 3 (three) times daily as needed for muscle spasms.     . Desoximetasone (TOPICORT) 0.25 % ointment Apply 1 application topically 2 (two) times daily as needed (for psoriasis). 30 g 6  . dexlansoprazole (DEXILANT) 60 MG capsule Take 60 mg by mouth daily as needed (for acid reflux).     . diazepam (VALIUM) 5 MG tablet Take 1 tablet (5 mg total) by mouth daily as needed for anxiety or muscle spasms. 30 tablet 0  . dicyclomine (BENTYL) 10 MG capsule TAKE 1 CAPSULE (10 MG TOTAL) BY MOUTH 4 (FOUR) TIMES DAILY - BEFORE MEALS AND AT BEDTIME. (Patient taking differently: Take 10 mg by mouth 4 (four) times daily as needed for spasms. ) 30 capsule 1  . diphenoxylate-atropine (LOMOTIL) 2.5-0.025 MG tablet Take 2  tablets by mouth 4 (four) times daily as needed for diarrhea or loose stools.     . docusate sodium (COLACE) 100 MG capsule TAKE 1 CAPSULE (100 MG TOTAL) BY MOUTH DAILY AS NEEDED FOR MILD CONSTIPATION. 30 capsule 0  . escitalopram (LEXAPRO) 20 MG tablet TAKE 1 TABLET EVERY DAY (Patient taking differently: Take 20 mg by mouth daily) 90 tablet 3  . ezetimibe (ZETIA) 10 MG tablet Take 10 mg by mouth every evening.     . fluticasone (FLONASE) 50 MCG/ACT nasal spray USE 2 SPRAYS INTO EACH NOSTRIL TWICE DAILY (Patient taking differently: Place 2 sprays into both nostrils 2 (two) times daily. ) 16 g 2  . meloxicam (MOBIC) 15 MG tablet TAKE 1 TABLET BY MOUTH EVERY DAY (Patient taking differently: TAKE 15 MG BY MOUTH EVERY DAY) 30 tablet 3  . metoprolol tartrate (LOPRESSOR) 25 MG tablet Take 0.5 tablets (12.5 mg total) by mouth  2 (two) times daily. 30 tablet 3  . metroNIDAZOLE (FLAGYL) 250 MG tablet Take 250 mg by mouth 3 (three) times daily as needed (for diverticulitis).    . Multiple Vitamin (MULTIVITAMIN) tablet Take 1 tablet by mouth daily.    Marland Kitchen NITROSTAT 0.4 MG SL tablet Place 0.4 mg under the tongue every 5 (five) minutes as needed for chest pain. For chest pain.    Marland Kitchen olopatadine (PATANOL) 0.1 % ophthalmic solution Place 1 drop into both eyes 2 (two) times daily.    . ondansetron (ZOFRAN ODT) 4 MG disintegrating tablet Take 1 tablet (4 mg total) by mouth every 8 (eight) hours as needed for nausea. 10 tablet 0  . oxyCODONE-acetaminophen (PERCOCET) 10-325 MG tablet Take 1-1.5 tablets by mouth 2 (two) times daily as needed for pain. (Patient taking differently: Take 1.5 tablets by mouth 2 (two) times daily. ) 90 tablet 0  . PROAIR HFA 108 (90 Base) MCG/ACT inhaler INHALE 2 PUFFS 4 TIMES A DAY AS NEEDED (Patient taking differently: INHALE 2 PUFFS 4 TIMES A DAY AS NEEDED FOR SHORTNESS OF BREATH) 8 Inhaler 0  . Probiotic Product (PROBIOTIC DAILY) CAPS Take 1 capsule by mouth daily.     . ranitidine (ZANTAC) 150 MG tablet Take 150 mg by mouth daily as needed for heartburn.     . zolpidem (AMBIEN) 10 MG tablet Take 10 mg by mouth at bedtime as needed for sleep.     . magic mouthwash SOLN Take 5 mLs by mouth 3 (three) times daily. 105 mL 0   No current facility-administered medications for this visit.     Allergies:  Allergies  Allergen Reactions  . Codeine Other (See Comments)    HALLUCINATIONS   . Propoxyphene N-Acetaminophen Other (See Comments)    HALLUCINATIONS  . Sulfonamide Derivatives Hives, Itching and Swelling  . Alka-Seltzer [Aspirin Effervescent] Other (See Comments)    "like she's fading away", jittery   . Norvasc [Amlodipine Besylate] Other (See Comments)    "passed out"  . Red Dye Other (See Comments)    "burning and tingling sensation"  . Singulair [Montelukast Sodium] Other (See Comments)     Increased depression, sadness  . Iohexol Nausea Only    JITTERY    . Latex Hives and Itching    Past Surgical History:  Procedure Laterality Date  . ABDOMINAL HYSTERECTOMY  09/1994   partial  . APPENDECTOMY    . BILATERAL SALPINGOOPHORECTOMY Bilateral 04/2011  . CARDIAC CATHETERIZATION  10/08/2007  . CESAREAN SECTION     x 1  . CHOLECYSTECTOMY N/A  02/16/2015   Procedure: LAPAROSCOPIC CHOLECYSTECTOMY WITH INTRAOPERATIVE CHOLANGIOGRAM;  Surgeon: Johnathan Hausen, MD;  Location: [facility];  Service: General;  Laterality: N/A;  . COLONOSCOPY WITH PROPOFOL N/A 08/19/2016   Procedure: COLONOSCOPY WITH PROPOFOL;  Surgeon: Carol Ada, MD;  Location: [facility];  Service: Endoscopy;  Laterality: N/A;  . CYSTOSCOPY W/ URETERAL STENT PLACEMENT  08/04/2009  . EXAMINATION UNDER ANESTHESIA  02/17/2012   Procedure: EXAM UNDER ANESTHESIA;  Surgeon: Pedro Earls, MD;  Location: [place];  Service: General;  Laterality: N/A;  Exam under anesthesia with lateral internal sphincterotomy  . LAPAROSCOPIC LYSIS INTESTINAL ADHESIONS  08/04/2009  . LEFT HEART CATH AND CORONARY ANGIOGRAPHY N/A 02/10/2017   Procedure: LEFT HEART CATH AND CORONARY ANGIOGRAPHY;  Surgeon: Dixie Dials, MD;  Location: [place] [facility];  Service: Cardiovascular;  Laterality: N/A;  . PERMANENT PACEMAKER INSERTION N/A 07/08/2014   MDT MRI compatible dual chamber pacemaker implanted by Dr Rayann Heman for symptomatic bradycardia  . SHOULDER ARTHROSCOPY W/ ROTATOR CUFF REPAIR Left 1999  . SMALL INTESTINE SURGERY    . SPHINCTEROTOMY  02/17/2012   Procedure: SPHINCTEROTOMY;  Surgeon: Pedro Earls, MD;  Location: [place];  Service: General;  Laterality: N/A;    Social History   Social History  . Marital status: Divorced    Spouse name: n/a  . Number of children: 1  . Years of education: 20   Occupational History  . Disabled Retired    Fibromyalgia, Depression, Anxiety   Social History Main Topics  .  Smoking status: Former Smoker    Packs/day: 0.50    Years: 37.00    Types: Cigarettes    Quit date: 06/05/2012  . Smokeless tobacco: Never Used  . Alcohol use No  . Drug use: No  . Sexual activity: Not Currently    Birth control/ protection: Post-menopausal   Other Topics Concern  . None   Social History Narrative   Lives alone.  Daughter and granddaughter live in [place], [place]. Pt exercise sometimes.          ROS Review of Systems See HPI [place]: No fevers or chills No malaise No diaphoresis Skin: No rash or itching Eyes: no blurry vision, no double vision GU: no dysuria or hematuria Neuro: no dizziness or headaches  Objective: Vitals:   02/23/17 1021  BP: 104/67  Pulse: 70  Resp: 17  Temp: 97.6 F (36.4 C)  TempSrc: Oral  SpO2: 97%  Weight: 163 lb 6.4 oz (74.1 kg)  Height: 5' 7"  (1.702 m)    Physical Exam General: alert, oriented, in NAD Head: normocephalic, atraumatic, no sinus tenderness Eyes: EOM intact, no scleral icterus or conjunctival injection Ears: TM clear bilaterally Nose: mucosa nonerythematous, nonedematous Throat: no pharyngeal exudate or erythema Tongue: thick white plaques on tongue Lymph: no posterior auricular, submental or cervical lymph adenopathy Heart: normal rate, normal sinus rhythm, no murmurs Lungs: clear to auscultation bilaterally, no wheezing   Assessment and Plan Saraiyah was seen today for thrush and spasms.  Diagnoses and all orders for this visit:  Oral thrush -     magic mouthwash SOLN; Take 5 mLs by mouth 3 (three) times daily.  Sick sinus syndrome ([place])  Pt will get flu vaccine at a separate visit Continue current meds from Cardiology -     Cancel: Flu Vaccine QUAD 36+ mos IM     [place]

## 2017-02-27 ENCOUNTER — Encounter: Payer: Self-pay | Admitting: Physician Assistant

## 2017-02-27 DIAGNOSIS — M797 Fibromyalgia: Secondary | ICD-10-CM

## 2017-02-27 MED ORDER — OXYCODONE-ACETAMINOPHEN 10-325 MG PO TABS: 1.5000 | ORAL_TABLET | Freq: Two times a day (BID) | ORAL | 0 refills | Status: DC

## 2017-02-27 NOTE — Telephone Encounter (Signed)
Patient notified via My Chart.  Meds ordered this encounter  Medications  . oxyCODONE-acetaminophen (PERCOCET) 10-325 MG tablet    Sig: Take 1.5 tablets by mouth 2 (two) times daily.    Dispense:  60 tablet    Refill:  0    Order Specific Question:   Supervising Provider    Answer:   Brigitte Pulse, EVA N [[id number]]

## 2017-03-01 ENCOUNTER — Ambulatory Visit (INDEPENDENT_AMBULATORY_CARE_PROVIDER_SITE_OTHER): Payer: 59 | Admitting: Psychiatry

## 2017-03-01 DIAGNOSIS — F331 Major depressive disorder, recurrent, moderate: Secondary | ICD-10-CM

## 2017-03-17 ENCOUNTER — Ambulatory Visit: Payer: Medicare Other

## 2017-03-20 ENCOUNTER — Telehealth: Payer: Self-pay | Admitting: Physician Assistant

## 2017-03-20 NOTE — Telephone Encounter (Signed)
Patient needs disability forms completed by [name]. I will place the blank forms in [name]'s box on 03/20/17. Please return to the FMLA/Disability desk in the back office within 5-7 business days. Thank you!

## 2017-03-21 DIAGNOSIS — F331 Major depressive disorder, recurrent, moderate: Secondary | ICD-10-CM

## 2017-03-21 NOTE — Telephone Encounter (Signed)
Forms completed and given to FMLA/Disability. Please print and attach current medication list.

## 2017-03-24 ENCOUNTER — Ambulatory Visit (INDEPENDENT_AMBULATORY_CARE_PROVIDER_SITE_OTHER): Payer: Medicare Other | Admitting: Physician Assistant

## 2017-03-24 ENCOUNTER — Ambulatory Visit (INDEPENDENT_AMBULATORY_CARE_PROVIDER_SITE_OTHER): Payer: Medicare Other

## 2017-03-24 ENCOUNTER — Encounter: Payer: Self-pay | Admitting: Physician Assistant

## 2017-03-24 VITALS — BP 120/86 | HR 87 | Temp 98.0°F | Resp 18 | Ht 67.0 in | Wt 166.4 lb

## 2017-03-24 VITALS — BP 120/86 | HR 87 | Ht 67.0 in | Wt 166.4 lb

## 2017-03-24 DIAGNOSIS — L918 Other hypertrophic disorders of the skin: Secondary | ICD-10-CM

## 2017-03-24 DIAGNOSIS — Z0000 Encounter for general adult medical examination without abnormal findings: Secondary | ICD-10-CM

## 2017-03-24 DIAGNOSIS — Z23 Encounter for immunization: Secondary | ICD-10-CM

## 2017-03-24 DIAGNOSIS — K121 Other forms of stomatitis: Secondary | ICD-10-CM | POA: Diagnosis not present

## 2017-03-24 DIAGNOSIS — M797 Fibromyalgia: Secondary | ICD-10-CM | POA: Diagnosis not present

## 2017-03-24 DIAGNOSIS — E785 Hyperlipidemia, unspecified: Secondary | ICD-10-CM | POA: Diagnosis not present

## 2017-03-24 DIAGNOSIS — E119 Type 2 diabetes mellitus without complications: Secondary | ICD-10-CM

## 2017-03-24 DIAGNOSIS — I10 Essential (primary) hypertension: Secondary | ICD-10-CM | POA: Diagnosis not present

## 2017-03-24 MED ORDER — OXYCODONE-ACETAMINOPHEN 10-325 MG PO TABS: 1.5000 | ORAL_TABLET | Freq: Two times a day (BID) | ORAL | 0 refills | Status: DC | PRN

## 2017-03-24 MED ORDER — OXYCODONE-ACETAMINOPHEN 10-325 MG PO TABS: 1.5000 | ORAL_TABLET | Freq: Two times a day (BID) | ORAL | 0 refills | Status: DC

## 2017-03-24 MED ORDER — MAGIC MOUTHWASH W/LIDOCAINE: 10.0000 mL | ORAL | 99 refills | Status: DC | PRN

## 2017-03-24 MED ORDER — BLOOD GLUCOSE MONITOR KIT: PACK | 99 refills | Status: DC

## 2017-03-24 NOTE — Patient Instructions (Addendum)
WOUND CARE . Keep area clean and dry for 24 hours. Do not remove bandage, if applied. . After 24 hours, remove bandage and wash wound gently with mild soap and warm water. Reapply a new bandage after cleaning wound, if directed. . Continue daily cleansing with soap and water until stitches/staples are removed. . Notify the office if you experience any of the following signs of infection: Swelling, redness, pus drainage, streaking, fever >101.0 F . Notify the office if you experience excessive bleeding that does not stop after 15-20 minutes of constant, firm pressure.   IF you received an x-ray today, you will receive an invoice from [facility]. Please contact [facility] at [phone] with questions or concerns regarding your invoice.   IF you received labwork today, you will receive an invoice from [place]. Please contact [facility] at [phone] with questions or concerns regarding your invoice.   Our billing staff will not be able to assist you with questions regarding bills from these companies.  You will be contacted with the lab results as soon as they are available. The fastest way to get your results is to activate your My Chart account. Instructions are located on the last page of this paperwork. If you have not heard from [place] regarding the results in 2 weeks, please contact this office.

## 2017-03-24 NOTE — Patient Instructions (Addendum)
[name] , Thank you for taking time to come for your Medicare Wellness Visit. I appreciate your ongoing commitment to your health goals. Please review the following plan we discussed and let me know if I can assist you in the future.   Screening recommendations/referrals: Colonoscopy: up to date, next due 08/20/2026 Mammogram: up to date, next due 06/21/2026 Bone Density: up to date, next due 06/04/2017 Recommended yearly ophthalmology/optometry visit for glaucoma screening and checkup Recommended yearly dental visit for hygiene and checkup.  Vaccinations: Influenza vaccine: administered today  Pneumococcal vaccine: up to date Tdap vaccine: up to date, next due 02/15/2022 Shingles vaccine: declined   Advanced directives: Advance directive discussed with you today. Even though you declined this today please call our office should you change your mind and we can give you the proper paperwork for you to fill out.   Conditions/risks identified: Try to lose about 6 lbs in the near future.  Next appointment: today @ 10:20 am with [place] 40-64 Years, Female Preventive care refers to lifestyle choices and visits with your health care provider that can promote health and wellness. What does preventive care include?  A yearly physical exam. This is also called an annual well check.  Dental exams once or twice a year.  Routine eye exams. Ask your health care provider how often you should have your eyes checked.  Personal lifestyle choices, including:  Daily care of your teeth and gums.  Regular physical activity.  Eating a healthy diet.  Avoiding tobacco and drug use.  Limiting alcohol use.  Practicing safe sex.  Taking low-dose aspirin daily starting at age 23.  Taking vitamin and mineral supplements as recommended by your health care provider. What happens during an annual well check? The services and screenings done by your health care provider during  your annual well check will depend on your age, overall health, lifestyle risk factors, and family history of disease. Counseling  Your health care provider may ask you questions about your:  Alcohol use.  Tobacco use.  Drug use.  Emotional well-being.  Home and relationship well-being.  Sexual activity.  Eating habits.  Work and work Statistician.  Method of birth control.  Menstrual cycle.  Pregnancy history. Screening  You may have the following tests or measurements:  Height, weight, and BMI.  Blood pressure.  Lipid and cholesterol levels. These may be checked every 5 years, or more frequently if you are over 30 years old.  Skin check.  Lung cancer screening. You may have this screening every year starting at age 16 if you have a 30-pack-year history of smoking and currently smoke or have quit within the past 15 years.  Fecal occult blood test (FOBT) of the stool. You may have this test every year starting at age 9.  Flexible sigmoidoscopy or colonoscopy. You may have a sigmoidoscopy every 5 years or a colonoscopy every 10 years starting at age 42.  Hepatitis C blood test.  Hepatitis B blood test.  Sexually transmitted disease (STD) testing.  Diabetes screening. This is done by checking your blood sugar (glucose) after you have not eaten for a while (fasting). You may have this done every 1-3 years.  Mammogram. This may be done every 1-2 years. Talk to your health care provider about when you should start having regular mammograms. This may depend on whether you have a family history of breast cancer.  BRCA-related cancer screening. This may be done if you have a  family history of breast, ovarian, tubal, or peritoneal cancers.  Pelvic exam and Pap test. This may be done every 3 years starting at age 83. Starting at age 30, this may be done every 5 years if you have a Pap test in combination with an HPV test.  Bone density scan. This is done to screen for  osteoporosis. You may have this scan if you are at high risk for osteoporosis. Discuss your test results, treatment options, and if necessary, the need for more tests with your health care provider. Vaccines  Your health care provider may recommend certain vaccines, such as:  Influenza vaccine. This is recommended every year.  Tetanus, diphtheria, and acellular pertussis (Tdap, Td) vaccine. You may need a Td booster every 10 years.  Zoster vaccine. You may need this after age 81.  Pneumococcal 13-valent conjugate (PCV13) vaccine. You may need this if you have certain conditions and were not previously vaccinated.  Pneumococcal polysaccharide (PPSV23) vaccine. You may need one or two doses if you smoke cigarettes or if you have certain conditions. Talk to your health care provider about which screenings and vaccines you need and how often you need them. This information is not intended to replace advice given to you by your health care provider. Make sure you discuss any questions you have with your health care provider. Document Released: 06/19/2015 Document Revised: 02/10/2016 Document Reviewed: 03/24/2015 Elsevier Interactive Patient Education  2017 [place] Prevention in the Home Falls can cause injuries. They can happen to people of all ages. There are many things you can do to make your home safe and to help prevent falls. What can I do on the outside of my home?  Regularly fix the edges of walkways and driveways and fix any cracks.  Remove anything that might make you trip as you walk through a door, such as a raised step or threshold.  Trim any bushes or trees on the path to your home.  Use bright outdoor lighting.  Clear any walking paths of anything that might make someone trip, such as rocks or tools.  Regularly check to see if handrails are loose or broken. Make sure that both sides of any steps have handrails.  Any raised decks and porches should have  guardrails on the edges.  Have any leaves, snow, or ice cleared regularly.  Use sand or salt on walking paths during winter.  Clean up any spills in your garage right away. This includes oil or grease spills. What can I do in the bathroom?  Use night lights.  Install grab bars by the toilet and in the tub and shower. Do not use towel bars as grab bars.  Use non-skid mats or decals in the tub or shower.  If you need to sit down in the shower, use a plastic, non-slip stool.  Keep the floor dry. Clean up any water that spills on the floor as soon as it happens.  Remove soap buildup in the tub or shower regularly.  Attach bath mats securely with double-sided non-slip rug tape.  Do not have throw rugs and other things on the floor that can make you trip. What can I do in the bedroom?  Use night lights.  Make sure that you have a light by your bed that is easy to reach.  Do not use any sheets or blankets that are too big for your bed. They should not hang down onto the floor.  Have a firm  chair that has side arms. You can use this for support while you get dressed.  Do not have throw rugs and other things on the floor that can make you trip. What can I do in the kitchen?  Clean up any spills right away.  Avoid walking on wet floors.  Keep items that you use a lot in easy-to-reach places.  If you need to reach something above you, use a strong step stool that has a grab bar.  Keep electrical cords out of the way.  Do not use floor polish or wax that makes floors slippery. If you must use wax, use non-skid floor wax.  Do not have throw rugs and other things on the floor that can make you trip. What can I do with my stairs?  Do not leave any items on the stairs.  Make sure that there are handrails on both sides of the stairs and use them. Fix handrails that are broken or loose. Make sure that handrails are as long as the stairways.  Check any carpeting to make sure that  it is firmly attached to the stairs. Fix any carpet that is loose or worn.  Avoid having throw rugs at the top or bottom of the stairs. If you do have throw rugs, attach them to the floor with carpet tape.  Make sure that you have a light switch at the top of the stairs and the bottom of the stairs. If you do not have them, ask someone to add them for you. What else can I do to help prevent falls?  Wear shoes that:  Do not have high heels.  Have rubber bottoms.  Are comfortable and fit you well.  Are closed at the toe. Do not wear sandals.  If you use a stepladder:  Make sure that it is fully opened. Do not climb a closed stepladder.  Make sure that both sides of the stepladder are locked into place.  Ask someone to hold it for you, if possible.  Clearly mark and make sure that you can see:  Any grab bars or handrails.  First and last steps.  Where the edge of each step is.  Use tools that help you move around (mobility aids) if they are needed. These include:  Canes.  Walkers.  Scooters.  Crutches.  Turn on the lights when you go into a dark area. Replace any light bulbs as soon as they burn out.  Set up your furniture so you have a clear path. Avoid moving your furniture around.  If any of your floors are uneven, fix them.  If there are any pets around you, be aware of where they are.  Review your medicines with your doctor. Some medicines can make you feel dizzy. This can increase your chance of falling. Ask your doctor what other things that you can do to help prevent falls. This information is not intended to replace advice given to you by your health care provider. Make sure you discuss any questions you have with your health care provider. Document Released: 03/19/2009 Document Revised: 10/29/2015 Document Reviewed: 06/27/2014 Elsevier Interactive Patient Education  2017 [employer].

## 2017-03-24 NOTE — Progress Notes (Signed)
Patient ID: [name], female    DOB: 13-Jul-1955, 61 y.o.   MRN: [id number]  PCP: [name], PA-C  Chief Complaint  Patient presents with  . Hypertension  . Diabetes  . Fibromyalgia  . Follow-up    3 month follow up  . Nevus    right arm, Pt states mole is getting bigger and it hurts.  . Medication Refill    Magic Mouthwash    Subjective:   Presents for evaluation of her chronic medical problems.  She had a Medicare Annual Wellness visit this morning.  HTN has been well controlled. Tolerating treatment without difficulty. FOllows regularly with cardiology. No CP, SOB, HA, dizziness, LE edema.  Diabetes also well controlled. She made significant eating changes when diagnosed, and does not require medications. Minimal exercise due to chronic pain, though she does get some PT, and would benefit from exercise from a fibromyalgia standpoint.   Fibromyalgia is stable. She experiences chronic pain. Anxiety and depression are contributing, and her sensitivity to treatments tried has limited improvement of all these issues.  There is a growing skin lesion of the RIGHT upper arm. Painful. She desires it be removed today.  Eye exam Q6 months, last was 2 weeks ago.  Review of Systems  Constitutional: Positive for fatigue. Negative for activity change, appetite change and unexpected weight change.  HENT: Negative for congestion, dental problem, ear pain, hearing loss, mouth sores, postnasal drip, rhinorrhea, sneezing, sore throat, tinnitus and trouble swallowing.   Eyes: Negative for photophobia, pain, redness and visual disturbance.  Respiratory: Negative for cough, chest tightness and shortness of breath.   Cardiovascular: Negative for chest pain, palpitations and leg swelling.  Gastrointestinal: Negative for abdominal pain, blood in stool, constipation, diarrhea, nausea and vomiting.  Endocrine: Negative for cold intolerance, heat intolerance, polydipsia, polyphagia and  polyuria.  Genitourinary: Negative for dysuria, frequency, hematuria and urgency.  Musculoskeletal: Positive for arthralgias and myalgias. Negative for gait problem and neck stiffness.  Skin: Negative for rash.  Neurological: Negative for dizziness, speech difficulty, weakness, light-headedness, numbness and headaches.  Hematological: Negative for adenopathy.  Psychiatric/Behavioral: Positive for dysphoric mood. Negative for confusion and sleep disturbance. The patient is nervous/anxious.        Patient Active Problem List   Diagnosis Date Noted  . Acute coronary syndrome ([place]) 02/09/2017  . Right knee pain 07/04/2016  . Pain in right ankle and joints of right foot 07/04/2016  . Psoriasis 03/10/2015  . Cholecystitis 02/16/2015  . Gallstones 02/15/2015  . Right foot pain 11/13/2014  . Ethmoid sinusitis 11/13/2014  . Abdominal pain, epigastric 11/13/2014  . Vitamin D deficiency 11/13/2014  . 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 Sick sinus syndrome ([place]) 07/08/2014  . Chest pain at rest 07/05/2014  . Chronic rhinitis 10/22/2013  . Fatty liver 10/22/2013  . Interstitial cystitis 10/22/2013  . Hip pain, right 10/22/2013  . Chronic insomnia 09/25/2012  . HTN (hypertension) 09/25/2012  . Hyperlipidemia 09/25/2012  . Diabetes mellitus type 2, controlled, without complications ([place]) 78/29/5621  . GERD 12/04/2008  . History of colonic polyps 12/04/2008  . CONSTIPATION 10/21/2008  . Fibromyalgia 10/21/2008  . LACTOSE INTOLERANCE 11/20/2007  . Chest pain, precordial 11/20/2007  . Anxiety and depression 08/02/2007  . Osteoporosis 08/02/2007  . Diverticulosis of large intestine 11/16/2006  . EXTERNAL HEMORRHOIDS 05/12/2005     Prior to Admission medications   Medication Sig Start Date End Date Taking? Authorizing Provider  Azilsartan Medoxomil (EDARBI) 40 MG TABS Take 40  mg by mouth daily.    Yes [provider]  beclomethasone (QVAR) 40 MCG/ACT inhaler Inhale 2 puffs into the  lungs 2 (two) times daily as needed (for shortness of breath).    Yes [provider]  cetirizine (ZYRTEC) 10 MG tablet Take 1 tablet (10 mg total) by mouth daily. 09/13/16  Yes Laisha Rau, PA-C  cholecalciferol (VITAMIN D) 1000 units tablet Take 1 tablet (1,000 Units total) by mouth daily. 09/13/16  Yes Sham Alviar, PA-C  ciprofloxacin (CIPRO) 500 MG tablet Take 500 mg by mouth 2 (two) times daily as needed (for diverticulitis).   Yes [provider]  cyclobenzaprine (FLEXERIL) 10 MG tablet Take 10 mg by mouth 3 (three) times daily as needed for muscle spasms.    Yes [provider]  Desoximetasone (TOPICORT) 0.25 % ointment Apply 1 application topically 2 (two) times daily as needed (for psoriasis). 11/13/14  Yes Laurin Morgenstern, PA-C  dexlansoprazole (DEXILANT) 60 MG capsule Take 60 mg by mouth daily as needed (for acid reflux).    Yes [provider]  diazepam (VALIUM) 5 MG tablet Take 1 tablet (5 mg total) by mouth daily as needed for anxiety or muscle spasms. 11/26/15  Yes Terriah Reggio, PA-C  dicyclomine (BENTYL) 10 MG capsule TAKE 1 CAPSULE (10 MG TOTAL) BY MOUTH 4 (FOUR) TIMES DAILY - BEFORE MEALS AND AT BEDTIME. Patient taking differently: Take 10 mg by mouth 4 (four) times daily as needed for spasms.  12/31/16  Yes Kaelan Emami, PA-C  diphenoxylate-atropine (LOMOTIL) 2.5-0.025 MG tablet Take 2 tablets by mouth 4 (four) times daily as needed for diarrhea or loose stools.    Yes [provider]  docusate sodium (COLACE) 100 MG capsule TAKE 1 CAPSULE (100 MG TOTAL) BY MOUTH DAILY AS NEEDED FOR MILD CONSTIPATION. 10/23/14  Yes Jian Hodgman, PA-C  escitalopram (LEXAPRO) 20 MG tablet TAKE 1 TABLET EVERY DAY Patient taking differently: Take 20 mg by mouth daily 11/24/16  Yes Tomoki Lucken, PA-C  ezetimibe (ZETIA) 10 MG tablet Take 10 mg by mouth every evening.    Yes [provider]  fluticasone (FLONASE) 50 MCG/ACT nasal spray USE 2  SPRAYS INTO EACH NOSTRIL TWICE DAILY Patient taking differently: Place 2 sprays into both nostrils 2 (two) times daily.  09/13/16  Yes Jahel Wavra, PA-C  meloxicam (MOBIC) 15 MG tablet TAKE 1 TABLET BY MOUTH EVERY DAY Patient taking differently: TAKE 15 MG BY MOUTH EVERY DAY 02/24/16  Yes Verneta Hamidi, PA-C  metoprolol tartrate (LOPRESSOR) 25 MG tablet Take 0.5 tablets (12.5 mg total) by mouth 2 (two) times daily. 02/11/17  Yes Dixie Dials, MD  metroNIDAZOLE (FLAGYL) 250 MG tablet Take 250 mg by mouth 3 (three) times daily as needed (for diverticulitis).   Yes [provider]  Multiple Vitamin (MULTIVITAMIN) tablet Take 1 tablet by mouth daily.   Yes [provider]  NITROSTAT 0.4 MG SL tablet Place 0.4 mg under the tongue every 5 (five) minutes as needed for chest pain. For chest pain. 11/24/11  Yes [provider]  olopatadine (PATANOL) 0.1 % ophthalmic solution Place 1 drop into both eyes 2 (two) times daily.   Yes [provider]  ondansetron (ZOFRAN ODT) 4 MG disintegrating tablet Take 1 tablet (4 mg total) by mouth every 8 (eight) hours as needed for nausea. 11/20/16  Yes Larene Pickett, PA-C  oxyCODONE-acetaminophen (PERCOCET) 10-325 MG tablet Take 1.5 tablets by mouth 2 (two) times daily. 02/27/17  Yes [name], PA-C  PROAIR  HFA 108 (90 Base) MCG/ACT inhaler INHALE 2 PUFFS 4 TIMES A DAY AS NEEDED Patient taking differently: INHALE 2 PUFFS 4 TIMES A DAY AS NEEDED FOR SHORTNESS OF BREATH 09/30/16  Yes Virjean Boman, PA-C  Probiotic Product (PROBIOTIC DAILY) CAPS Take 1 capsule by mouth daily.    Yes [provider]  ranitidine (ZANTAC) 150 MG tablet Take 150 mg by mouth daily as needed for heartburn.    Yes [provider]  zolpidem (AMBIEN) 10 MG tablet Take 10 mg by mouth at bedtime as needed for sleep.  09/28/13  Yes [provider]     Allergies  Allergen Reactions  . Codeine Other (See Comments)     HALLUCINATIONS   . Propoxyphene N-Acetaminophen Other (See Comments)    HALLUCINATIONS  . Sulfonamide Derivatives Hives, Itching and Swelling  . Alka-Seltzer [Aspirin Effervescent] Other (See Comments)    "like she's fading away", jittery   . Norvasc [Amlodipine Besylate] Other (See Comments)    "passed out"  . Red Dye Other (See Comments)    "burning and tingling sensation"  . Singulair [Montelukast Sodium] Other (See Comments)    Increased depression, sadness  . Iohexol Nausea Only    JITTERY    . Latex Hives and Itching       Objective:  Physical Exam  Constitutional: She is oriented to person, place, and time. She appears well-developed and well-nourished. She is active and cooperative. No distress.  BP 120/86 (BP Location: Right Arm, Patient Position: Sitting, Cuff Size: Normal)   Pulse 87   Temp 98 F (36.7 C) (Oral)   Resp 18   Ht _0  (1.702 m)   Wt 166 lb 6.4 oz (75.5 kg)   SpO2 97%   BMI 26.06 kg/m   HENT:  Head: Normocephalic and atraumatic.  Right Ear: Hearing normal.  Left Ear: Hearing normal.  Eyes: Conjunctivae are normal. No scleral icterus.  Neck: Normal range of motion. Neck supple. No thyromegaly present.  Cardiovascular: Normal rate, regular rhythm and normal heart sounds.  Pulses:      Radial pulses are 2+ on the right side, and 2+ on the left side.  Pulmonary/Chest: Effort normal and breath sounds normal.  Lymphadenopathy:       Head (right side): No tonsillar, no preauricular, no posterior auricular and no occipital adenopathy present.       Head (left side): No tonsillar, no preauricular, no posterior auricular and no occipital adenopathy present.    She has no cervical adenopathy.       Right: No supraclavicular adenopathy present.       Left: No supraclavicular adenopathy present.  Neurological: She is alert and oriented to person, place, and time. No sensory deficit.  Skin: Skin is warm, dry and intact. No rash noted. No cyanosis or  erythema. Nails show no clubbing.     Psychiatric: She has a normal mood and affect. Her speech is normal and behavior is normal.   PROCEDURE: Verbal Consent obtained. Alcohol prep pad used to cleanse the area. Local anesthesia with 1 cc of Lidocaine 2% with epinephrine. Betadine prep. Removed with sharps. Cleansed and dressed.         Assessment & Plan:   Problem List Items Addressed This Visit    Diabetes mellitus type 2, controlled, without complications ([place]) - Primary (Chronic)    Has been controlled with lifestyle. Await labs. Adjust regimen as indicated by results. Plan metformin if A1C >7%.      Relevant  Medications   blood glucose meter kit and supplies KIT   Other Relevant Orders   Comprehensive metabolic panel (Completed)   HTN (hypertension) (Chronic)    Well controlled. Continue current treatment and follow-up with cardiology.      Relevant Orders   CBC with Differential/Platelet (Completed)   Comprehensive metabolic panel (Completed)   Hyperlipidemia (Chronic)    Await labs. Adjust regimen as indicated by results. LDL goal <70 Not currently on statin.      Relevant Orders   Comprehensive metabolic panel (Completed)   Lipid panel (Completed)   Fibromyalgia    Stable. No change in treatment.      Relevant Medications   oxyCODONE-acetaminophen (PERCOCET) 10-325 MG tablet   oxyCODONE-acetaminophen (PERCOCET) 10-325 MG tablet    Other Visit Diagnoses    Inflamed skin tag       Removed with sharps. Local wound care.   Need for influenza vaccination       Received at [facility].   Stomatitis       Relevant Medications   magic mouthwash w/lidocaine SOLN       Return in about 3 months (around 06/24/2017) for re-evaluation of diabetes, blood pressure, fibromyalgia.   Fara Chute, PA-C [facility] [place]

## 2017-03-24 NOTE — Progress Notes (Signed)
Subjective:   [name] is a 61 y.o. female who presents for Medicare Annual (Subsequent) preventive examination.  Review of Systems:  N/A Cardiac Risk Factors include: diabetes mellitus;hypertension;dyslipidemia     Objective:     Vitals: BP 120/86   Pulse 87   Ht 5' 7"  (1.702 m)   Wt 166 lb 6 oz (75.5 kg)   SpO2 97%   BMI 26.06 kg/m   Body mass index is 26.06 kg/m.   Tobacco History  Smoking Status  . Former Smoker  . Packs/day: 0.50  . Years: 37.00  . Types: Cigarettes  . Quit date: 06/05/2012  Smokeless Tobacco  . Never Used     Counseling given: Not Answered   Past Medical History:  Diagnosis Date  . Allergy   . Anal fissure 02/2012  . Anxiety   . Arthritis   . Asthma   . Bursitis    right hip  . Complication of anesthesia     woke up during a colonscopy, difficult to wake up post-op sometimes , requires lots of blankets to stay warm post op  . Depression   . Diabetes mellitus without complication ([place])    type 2 diet controlled  . Fatty liver    sees dr Collene Mares for  . Fibromyalgia   . GERD (gastroesophageal reflux disease)   . Hepatitis A infection 1979  . Hyperlipidemia   . Hypertension   . Irritable bowel syndrome (IBS)   . 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 Psoriasis    left foot  . 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 Tarsal tunnel syndrome of left side    Past Surgical History:  Procedure Laterality Date  . ABDOMINAL HYSTERECTOMY  09/1994   partial  . APPENDECTOMY    . BILATERAL SALPINGOOPHORECTOMY Bilateral 04/2011  . CARDIAC CATHETERIZATION  10/08/2007  . CESAREAN SECTION     x 1  . CHOLECYSTECTOMY N/A 02/16/2015   Procedure: LAPAROSCOPIC CHOLECYSTECTOMY WITH INTRAOPERATIVE CHOLANGIOGRAM;  Surgeon: Johnathan Hausen, MD;  Location: [facility];  Service: General;  Laterality: N/A;  . COLONOSCOPY WITH PROPOFOL N/A 08/19/2016   Procedure: COLONOSCOPY  WITH PROPOFOL;  Surgeon: Carol Ada, MD;  Location: [facility];  Service: Endoscopy;  Laterality: N/A;  . CYSTOSCOPY W/ URETERAL STENT PLACEMENT  08/04/2009  . EXAMINATION UNDER ANESTHESIA  02/17/2012   Procedure: EXAM UNDER ANESTHESIA;  Surgeon: Pedro Earls, MD;  Location: [facility];  Service: General;  Laterality: N/A;  Exam under anesthesia with lateral internal sphincterotomy  . LAPAROSCOPIC LYSIS INTESTINAL ADHESIONS  08/04/2009  . LEFT HEART CATH AND CORONARY ANGIOGRAPHY N/A 02/10/2017   Procedure: LEFT HEART CATH AND CORONARY ANGIOGRAPHY;  Surgeon: Dixie Dials, MD;  Location: [facility];  Service: Cardiovascular;  Laterality: N/A;  . PERMANENT PACEMAKER INSERTION N/A 07/08/2014   MDT MRI compatible dual chamber pacemaker implanted by Dr Rayann Heman for symptomatic bradycardia  . SHOULDER ARTHROSCOPY W/ ROTATOR CUFF REPAIR Left 1999  . SMALL INTESTINE SURGERY    . SPHINCTEROTOMY  02/17/2012   Procedure: SPHINCTEROTOMY;  Surgeon: Pedro Earls, MD;  Location: [facility];  Service: General;  Laterality: N/A;   Family History  Problem Relation Age of Onset  . Cancer Mother        liver  . Diabetes Mother   . Hyperlipidemia Mother   . Hypertension Mother   . Stroke  Mother   . Cancer Maternal Grandmother        breast  . Anesthesia problems Sister        hard to wake up post-op  . Heart disease Father   . Hyperlipidemia Brother   . HIV/AIDS Brother   . Cancer Maternal Grandfather        colon  . Diabetes Brother   . Cancer Brother        lung  . Cancer Paternal Grandfather        COLON   History  Sexual Activity  . Sexual activity: Not Currently  . Birth control/ protection: Post-menopausal    Outpatient Encounter Prescriptions as of 03/24/2017  Medication Sig  . Azilsartan Medoxomil (EDARBI) 40 MG TABS Take 40 mg by mouth daily.   . beclomethasone (QVAR) 40 MCG/ACT inhaler Inhale 2 puffs into the lungs 2 (two) times daily as needed  (for shortness of breath).   . cetirizine (ZYRTEC) 10 MG tablet Take 1 tablet (10 mg total) by mouth daily.  . cholecalciferol (VITAMIN D) 1000 units tablet Take 1 tablet (1,000 Units total) by mouth daily.  . ciprofloxacin (CIPRO) 500 MG tablet Take 500 mg by mouth 2 (two) times daily as needed (for diverticulitis).  . cyclobenzaprine (FLEXERIL) 10 MG tablet Take 10 mg by mouth 3 (three) times daily as needed for muscle spasms.   . Desoximetasone (TOPICORT) 0.25 % ointment Apply 1 application topically 2 (two) times daily as needed (for psoriasis).  Marland Kitchen dexlansoprazole (DEXILANT) 60 MG capsule Take 60 mg by mouth daily as needed (for acid reflux).   . diazepam (VALIUM) 5 MG tablet Take 1 tablet (5 mg total) by mouth daily as needed for anxiety or muscle spasms.  Marland Kitchen dicyclomine (BENTYL) 10 MG capsule TAKE 1 CAPSULE (10 MG TOTAL) BY MOUTH 4 (FOUR) TIMES DAILY - BEFORE MEALS AND AT BEDTIME. (Patient taking differently: Take 10 mg by mouth 4 (four) times daily as needed for spasms. )  . diphenoxylate-atropine (LOMOTIL) 2.5-0.025 MG tablet Take 2 tablets by mouth 4 (four) times daily as needed for diarrhea or loose stools.   . docusate sodium (COLACE) 100 MG capsule TAKE 1 CAPSULE (100 MG TOTAL) BY MOUTH DAILY AS NEEDED FOR MILD CONSTIPATION.  Marland Kitchen escitalopram (LEXAPRO) 20 MG tablet TAKE 1 TABLET EVERY DAY (Patient taking differently: Take 20 mg by mouth daily)  . ezetimibe (ZETIA) 10 MG tablet Take 10 mg by mouth every evening.   . fluticasone (FLONASE) 50 MCG/ACT nasal spray USE 2 SPRAYS INTO EACH NOSTRIL TWICE DAILY (Patient taking differently: Place 2 sprays into both nostrils 2 (two) times daily. )  . meloxicam (MOBIC) 15 MG tablet TAKE 1 TABLET BY MOUTH EVERY DAY (Patient taking differently: TAKE 15 MG BY MOUTH EVERY DAY)  . metoprolol tartrate (LOPRESSOR) 25 MG tablet Take 0.5 tablets (12.5 mg total) by mouth 2 (two) times daily.  . metroNIDAZOLE (FLAGYL) 250 MG tablet Take 250 mg by mouth 3 (three)  times daily as needed (for diverticulitis).  . Multiple Vitamin (MULTIVITAMIN) tablet Take 1 tablet by mouth daily.  Marland Kitchen NITROSTAT 0.4 MG SL tablet Place 0.4 mg under the tongue every 5 (five) minutes as needed for chest pain. For chest pain.  Marland Kitchen olopatadine (PATANOL) 0.1 % ophthalmic solution Place 1 drop into both eyes 2 (two) times daily.  . ondansetron (ZOFRAN ODT) 4 MG disintegrating tablet Take 1 tablet (4 mg total) by mouth every 8 (eight) hours as needed for nausea.  Marland Kitchen oxyCODONE-acetaminophen (PERCOCET)  10-325 MG tablet Take 1.5 tablets by mouth 2 (two) times daily.  Marland Kitchen PROAIR HFA 108 (90 Base) MCG/ACT inhaler INHALE 2 PUFFS 4 TIMES A DAY AS NEEDED (Patient taking differently: INHALE 2 PUFFS 4 TIMES A DAY AS NEEDED FOR SHORTNESS OF BREATH)  . Probiotic Product (PROBIOTIC DAILY) CAPS Take 1 capsule by mouth daily.   . ranitidine (ZANTAC) 150 MG tablet Take 150 mg by mouth daily as needed for heartburn.   . zolpidem (AMBIEN) 10 MG tablet Take 10 mg by mouth at bedtime as needed for sleep.    No facility-administered encounter medications on file as of 03/24/2017.     Activities of Daily Living In your present state of health, do you have any difficulty performing the following activities: 03/24/2017  Hearing? N  Vision? N  Difficulty concentrating or making decisions? Y  Comment issues with remembering names  Walking or climbing stairs? Y  Comment Patient has chronic pain and has trouble climbing stairs  Dressing or bathing? N  Doing errands, shopping? N  Preparing Food and eating ? N  Using the Toilet? N  In the past six months, have you accidently leaked urine? N  Do you have problems with loss of bowel control? N  Managing your Medications? N  Managing your Finances? N  Housekeeping or managing your Housekeeping? N  Some recent data might be hidden    Patient Care Team: [name], PA-C as PCP - General (Physician Assistant) Dixie Dials, MD as Consulting Physician  (Cardiology) Bo Merino, MD as Consulting Physician (Rheumatology) Johnathan Hausen, MD as Consulting Physician (General Surgery) Marylynn Pearson, MD as Consulting Physician (Obstetrics and Gynecology) Juanita Craver, MD as Consulting Physician (Gastroenterology)    Assessment:     Exercise Activities and Dietary recommendations Current Exercise Habits: The patient does not participate in regular exercise at present (Patient goes to physical therapy 2 times a week ), Exercise limited by: None identified  Goals    . Weight (lb) < 160 lb (72.6 kg)          Patient states that she wants to try to lose about 6 lbs in the near future.      Fall Risk Fall Risk  03/24/2017 02/23/2017 12/20/2016 11/24/2016 09/13/2016  Falls in the past year? No Yes No No No  Number falls in past yr: - 1 - - -  Injury with Fall? - Yes - - -  Comment - pt injured her hip - - -   Depression Screen PHQ 2/9 Scores 03/24/2017 02/23/2017 12/20/2016 11/24/2016  PHQ - 2 Score 0 0 0 0  PHQ- 9 Score 12 - - -     Cognitive Function     6CIT Screen 03/24/2017  What Year? 0 points  What month? 0 points  What time? 0 points  Count back from 20 0 points  Months in reverse 0 points  Repeat phrase 2 points  Total Score 2    Immunization History  Administered Date(s) Administered  . Influenza,inj,Quad PF,6+ Mos 03/10/2015, 04/12/2016, 03/24/2017  . Influenza-Unspecified 03/17/2014  . Pneumococcal Polysaccharide-23 03/10/2015  . Td 02/16/2012  . Tdap 02/16/2012   Screening Tests Health Maintenance  Topic Date Due  . FOOT EXAM  04/12/2017  . HEMOGLOBIN A1C  08/09/2017  . OPHTHALMOLOGY EXAM  09/13/2017  . PAP SMEAR  12/23/2017  . MAMMOGRAM  06/21/2018  . PNEUMOCOCCAL POLYSACCHARIDE VACCINE (2) 03/09/2020  . COLONOSCOPY  08/19/2020  . TETANUS/TDAP  02/15/2022  . INFLUENZA VACCINE  Completed  .  Hepatitis C Screening  Completed  . HIV Screening  Completed      Plan:   I have personally reviewed and  noted the following in the patient's chart:   . Medical and social history . Use of alcohol, tobacco or illicit drugs  . Current medications and supplements . Functional ability and status . Nutritional status . Physical activity . Advanced directives . List of other physicians . Hospitalizations, surgeries, and ER visits in previous 12 months . Vitals . Screenings to include cognitive, depression, and falls . Referrals and appointments  In addition, I have reviewed and discussed with patient certain preventive protocols, quality metrics, and best practice recommendations. A written personalized care plan for preventive services as well as general preventive health recommendations were provided to patient.     Andrez Grime, LPN  24/29/9806

## 2017-03-25 LAB — COMPREHENSIVE METABOLIC PANEL
ALBUMIN: 4.7 g/dL (ref 3.6–4.8)
ALT: 51 IU/L — AB (ref 0–32)
AST: 33 IU/L (ref 0–40)
Albumin/Globulin Ratio: 1.6 (ref 1.2–2.2)
Alkaline Phosphatase: 64 IU/L (ref 39–117)
BILIRUBIN TOTAL: 0.6 mg/dL (ref 0.0–1.2)
BUN / CREAT RATIO: 13 (ref 12–28)
BUN: 9 mg/dL (ref 8–27)
CALCIUM: 9.8 mg/dL (ref 8.7–10.3)
CHLORIDE: 101 mmol/L (ref 96–106)
CO2: 22 mmol/L (ref 20–29)
Creatinine, Ser: 0.71 mg/dL (ref 0.57–1.00)
GFR, EST AFRICAN AMERICAN: 106 mL/min/{1.73_m2} (ref >59)
GFR, EST NON AFRICAN AMERICAN: 92 mL/min/{1.73_m2} (ref >59)
GLUCOSE: 92 mg/dL (ref 65–99)
Globulin, Total: 3 g/dL (ref 1.5–4.5)
Potassium: 4.3 mmol/L (ref 3.5–5.2)
Sodium: 140 mmol/L (ref 134–144)
TOTAL PROTEIN: 7.7 g/dL (ref 6.0–8.5)

## 2017-03-25 LAB — CBC WITH DIFFERENTIAL/PLATELET
BASOS ABS: 0 10*3/uL (ref 0.0–0.2)
BASOS: 0 %
EOS (ABSOLUTE): 0.1 10*3/uL (ref 0.0–0.4)
Eos: 1 %
HEMOGLOBIN: 13 g/dL (ref 11.1–15.9)
Hematocrit: 40.6 % (ref 34.0–46.6)
IMMATURE GRANS (ABS): 0 10*3/uL (ref 0.0–0.1)
IMMATURE GRANULOCYTES: 0 %
LYMPHS: 42 %
Lymphocytes Absolute: 2.5 10*3/uL (ref 0.7–3.1)
MCH: 27.2 pg (ref 26.6–33.0)
MCHC: 32 g/dL (ref 31.5–35.7)
MCV: 85 fL (ref 79–97)
MONOCYTES: 8 %
Monocytes Absolute: 0.5 10*3/uL (ref 0.1–0.9)
NEUTROS ABS: 2.9 10*3/uL (ref 1.4–7.0)
NEUTROS PCT: 49 %
Platelets: 304 10*3/uL (ref 150–379)
RBC: 4.78 x10E6/uL (ref 3.77–5.28)
RDW: 14.6 % (ref 12.3–15.4)
WBC: 6 10*3/uL (ref 3.4–10.8)

## 2017-03-25 LAB — LIPID PANEL
CHOL/HDL RATIO: 5.5 ratio — AB (ref 0.0–4.4)
Cholesterol, Total: 191 mg/dL (ref 100–199)
HDL: 35 mg/dL — AB (ref >39)
LDL CALC: 136 mg/dL — AB (ref 0–99)
Triglycerides: 102 mg/dL (ref 0–149)
VLDL CHOLESTEROL CAL: 20 mg/dL (ref 5–40)

## 2017-04-07 ENCOUNTER — Telehealth: Payer: Self-pay

## 2017-04-09 NOTE — Assessment & Plan Note (Signed)
Has been controlled with lifestyle. Await labs. Adjust regimen as indicated by results. Plan metformin if A1C >7%.

## 2017-04-09 NOTE — Assessment & Plan Note (Signed)
Stable. No change in treatment.

## 2017-04-09 NOTE — Assessment & Plan Note (Signed)
Await labs. Adjust regimen as indicated by results. LDL goal <70 Not currently on statin.

## 2017-04-09 NOTE — Assessment & Plan Note (Signed)
Well controlled. Continue current treatment and follow-up with cardiology.

## 2017-04-25 ENCOUNTER — Ambulatory Visit (INDEPENDENT_AMBULATORY_CARE_PROVIDER_SITE_OTHER): Payer: Medicare Other | Admitting: Psychiatry

## 2017-04-25 DIAGNOSIS — F331 Major depressive disorder, recurrent, moderate: Secondary | ICD-10-CM

## 2017-05-09 ENCOUNTER — Encounter: Payer: Self-pay | Admitting: Physician Assistant

## 2017-05-09 ENCOUNTER — Other Ambulatory Visit: Payer: Self-pay | Admitting: Physician Assistant

## 2017-05-09 DIAGNOSIS — M797 Fibromyalgia: Secondary | ICD-10-CM

## 2017-05-09 MED ORDER — OXYCODONE-ACETAMINOPHEN 10-325 MG PO TABS: 1.5000 | ORAL_TABLET | Freq: Two times a day (BID) | ORAL | 0 refills | Status: DC

## 2017-05-09 NOTE — Telephone Encounter (Signed)
Rx sent electronically.  Meds ordered this encounter  Medications  . oxyCODONE-acetaminophen (PERCOCET) 10-325 MG tablet    Sig: Take 1.5 tablets by mouth 2 (two) times daily.    Dispense:  90 tablet    Refill:  0

## 2017-05-10 MED ORDER — OXYCODONE-ACETAMINOPHEN 10-325 MG PO TABS: 1.5000 | ORAL_TABLET | Freq: Two times a day (BID) | ORAL | 0 refills | Status: DC | PRN

## 2017-05-10 NOTE — Telephone Encounter (Signed)
Authorized refill, THEN realized that it was a duplicate, it was sent yesterday.  Called pharmacy to cancel duplicate Rx.  Meds ordered this encounter  Medications  . DISCONTD: oxyCODONE-acetaminophen (PERCOCET) 10-325 MG tablet    Sig: Take 1.5 tablets by mouth 2 (two) times daily as needed for pain.    Dispense:  90 tablet    Refill:  0    Order Specific Question:   Supervising Provider    Answer:   Brigitte Pulse, EVA N [[id number]]

## 2017-05-15 ENCOUNTER — Ambulatory Visit (INDEPENDENT_AMBULATORY_CARE_PROVIDER_SITE_OTHER): Payer: Medicare Other | Admitting: *Deleted

## 2017-05-15 ENCOUNTER — Other Ambulatory Visit: Payer: Self-pay | Admitting: Physician Assistant

## 2017-05-15 DIAGNOSIS — I495 Sick sinus syndrome: Secondary | ICD-10-CM

## 2017-05-15 DIAGNOSIS — E119 Type 2 diabetes mellitus without complications: Secondary | ICD-10-CM

## 2017-05-15 NOTE — Progress Notes (Signed)
Remote pacemaker transmission.   

## 2017-05-17 LAB — CUP PACEART REMOTE DEVICE CHECK
Brady Statistic AP VP Percent: 0.09 %
Brady Statistic AP VS Percent: 99.52 %
Brady Statistic AS VP Percent: 0 %
Brady Statistic RA Percent Paced: 99.5 %
Implantable Lead Implant Date: 20160202
Implantable Lead Location: 753859
Implantable Lead Model: 5076
Lead Channel Impedance Value: 418 Ohm
Lead Channel Impedance Value: 418 Ohm
Lead Channel Impedance Value: 494 Ohm
Lead Channel Pacing Threshold Amplitude: 0.875 V
Lead Channel Pacing Threshold Pulse Width: 0.4 ms
Lead Channel Pacing Threshold Pulse Width: 0.4 ms
Lead Channel Sensing Intrinsic Amplitude: 21.375 mV
Lead Channel Sensing Intrinsic Amplitude: 21.375 mV
Lead Channel Sensing Intrinsic Amplitude: 3.875 mV
Lead Channel Setting Pacing Amplitude: 2 V
Lead Channel Setting Pacing Amplitude: 2.5 V
Lead Channel Setting Pacing Pulse Width: 0.4 ms
MDC IDC LEAD IMPLANT DT: 20160202
MDC IDC LEAD LOCATION: 753860
MDC IDC MSMT BATTERY REMAINING LONGEVITY: 88 mo
MDC IDC MSMT BATTERY VOLTAGE: 3.01 V
MDC IDC MSMT LEADCHNL RA PACING THRESHOLD AMPLITUDE: 0.5 V
MDC IDC MSMT LEADCHNL RA SENSING INTR AMPL: 3.875 mV
MDC IDC MSMT LEADCHNL RV IMPEDANCE VALUE: 532 Ohm
MDC IDC PG IMPLANT DT: 20160202
MDC IDC SESS DTM: 20181210175706
MDC IDC SET LEADCHNL RV SENSING SENSITIVITY: 2.8 mV
MDC IDC STAT BRADY AS VS PERCENT: 0.39 %
MDC IDC STAT BRADY RV PERCENT PACED: 0.09 %

## 2017-05-18 ENCOUNTER — Other Ambulatory Visit: Payer: Self-pay | Admitting: Cardiovascular Disease

## 2017-05-18 DIAGNOSIS — F17200 Nicotine dependence, unspecified, uncomplicated: Secondary | ICD-10-CM

## 2017-05-18 DIAGNOSIS — J449 Chronic obstructive pulmonary disease, unspecified: Secondary | ICD-10-CM

## 2017-05-19 ENCOUNTER — Encounter: Payer: Self-pay | Admitting: Cardiology

## 2017-06-01 ENCOUNTER — Ambulatory Visit: Payer: Medicare Other | Admitting: Psychiatry

## 2017-06-01 NOTE — Telephone Encounter (Signed)
Error-Close Encounter

## 2017-06-08 ENCOUNTER — Ambulatory Visit: Payer: Self-pay

## 2017-06-12 ENCOUNTER — Encounter: Payer: Self-pay | Admitting: Physician Assistant

## 2017-06-12 DIAGNOSIS — M797 Fibromyalgia: Secondary | ICD-10-CM

## 2017-06-12 MED ORDER — OXYCODONE-ACETAMINOPHEN 10-325 MG PO TABS: 1.5000 | ORAL_TABLET | Freq: Two times a day (BID) | ORAL | 0 refills | Status: DC

## 2017-06-14 ENCOUNTER — Other Ambulatory Visit: Payer: Self-pay | Admitting: Cardiovascular Disease

## 2017-06-14 DIAGNOSIS — F17200 Nicotine dependence, unspecified, uncomplicated: Secondary | ICD-10-CM

## 2017-06-15 ENCOUNTER — Ambulatory Visit
Admission: RE | Admit: 2017-06-15 | Discharge: 2017-06-15 | Disposition: A | Discharge status: Home / Self Care | Payer: Medicare Other | Source: Ambulatory Visit | Attending: Cardiovascular Disease | Admitting: Cardiovascular Disease

## 2017-06-15 DIAGNOSIS — F17200 Nicotine dependence, unspecified, uncomplicated: Secondary | ICD-10-CM

## 2017-06-28 ENCOUNTER — Ambulatory Visit: Payer: Self-pay | Admitting: Physician Assistant

## 2017-07-04 ENCOUNTER — Encounter: Payer: Self-pay | Admitting: Physician Assistant

## 2017-07-05 ENCOUNTER — Ambulatory Visit: Payer: Medicare Other | Admitting: Physician Assistant

## 2017-07-08 IMAGING — DX DG CHEST 2V
2 series · 2 of 2 positions shown · non-contrast
Comparison: 09/14/2014

CLINICAL DATA: Chest pain

EXAM:
CHEST  2 VIEW

[w chest pa]
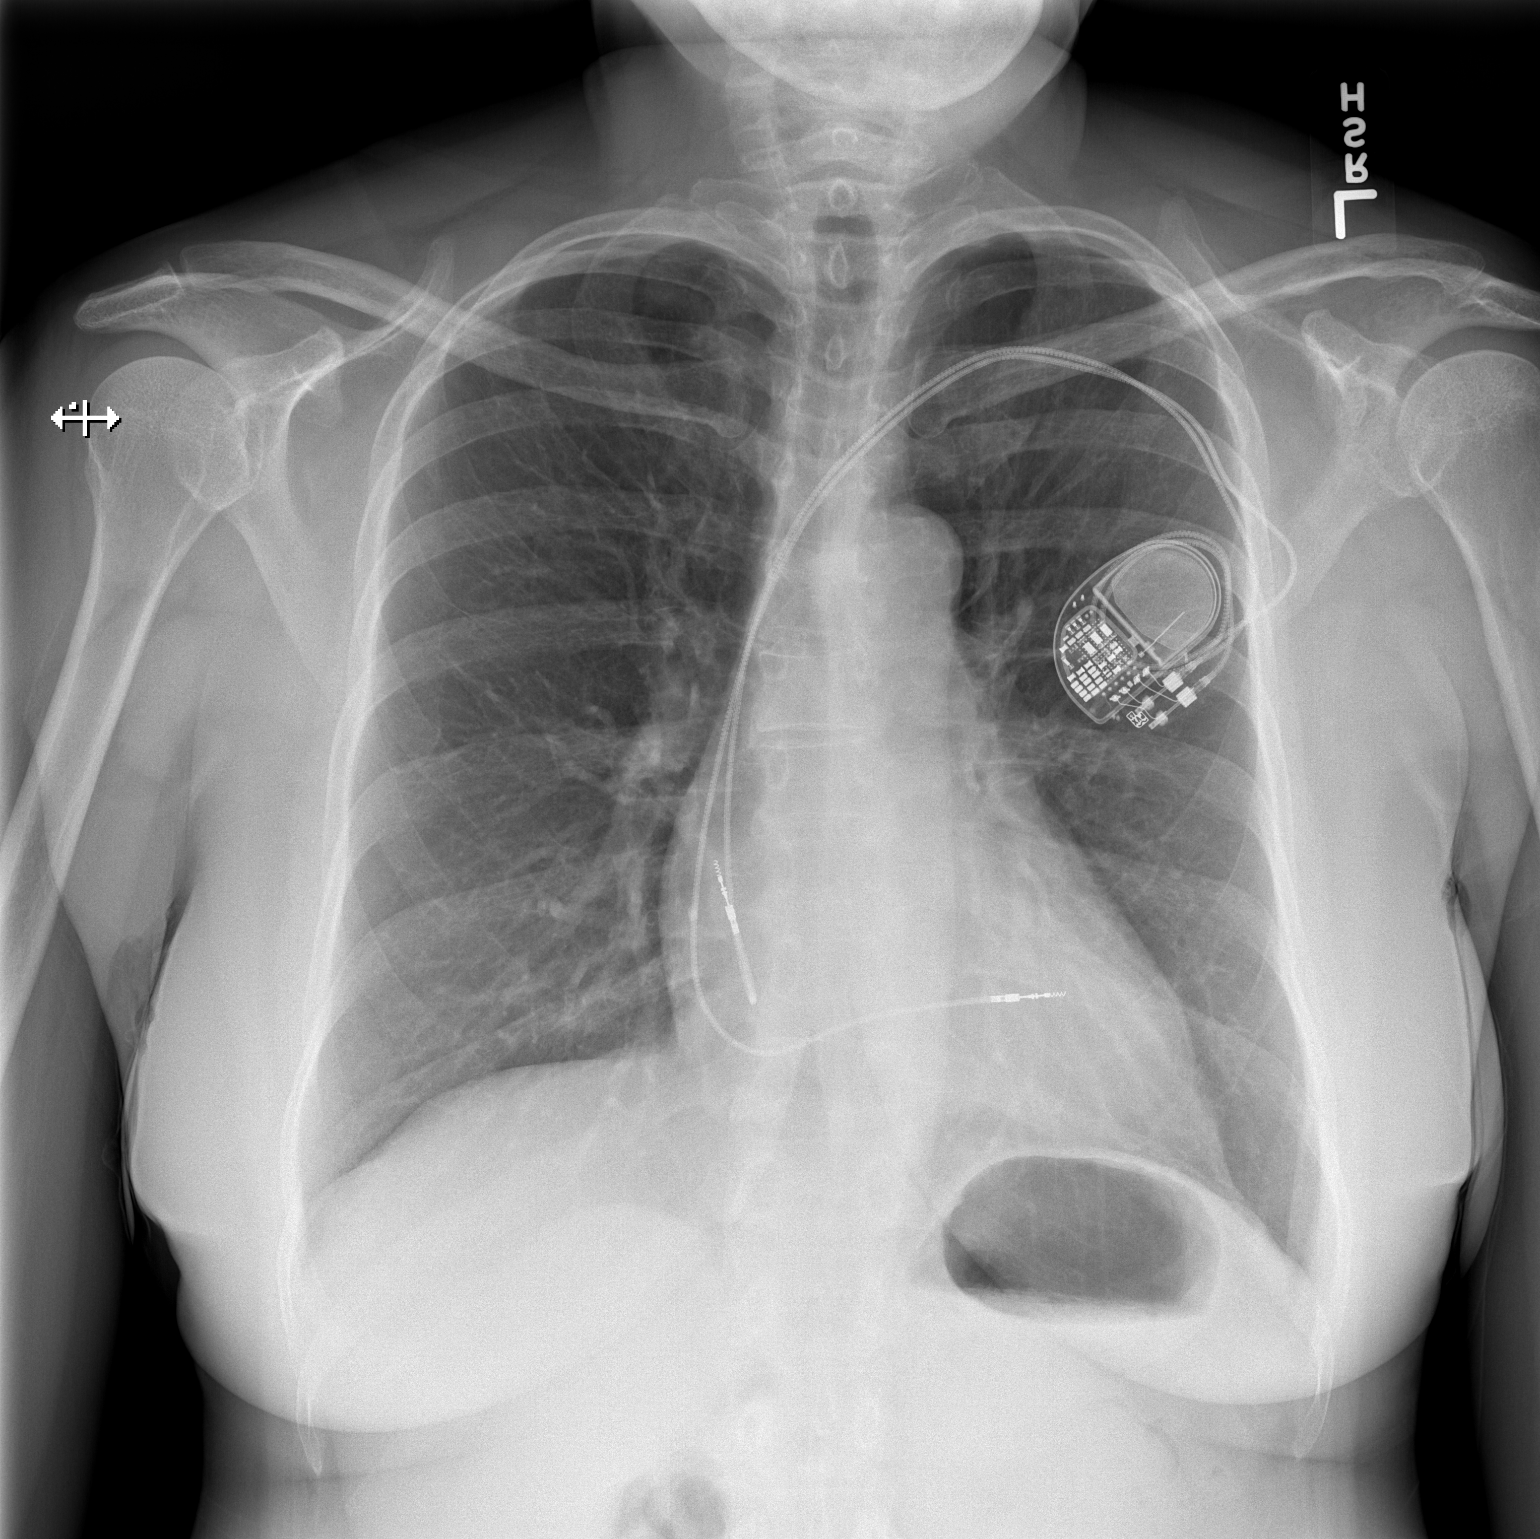

[w chest lat]
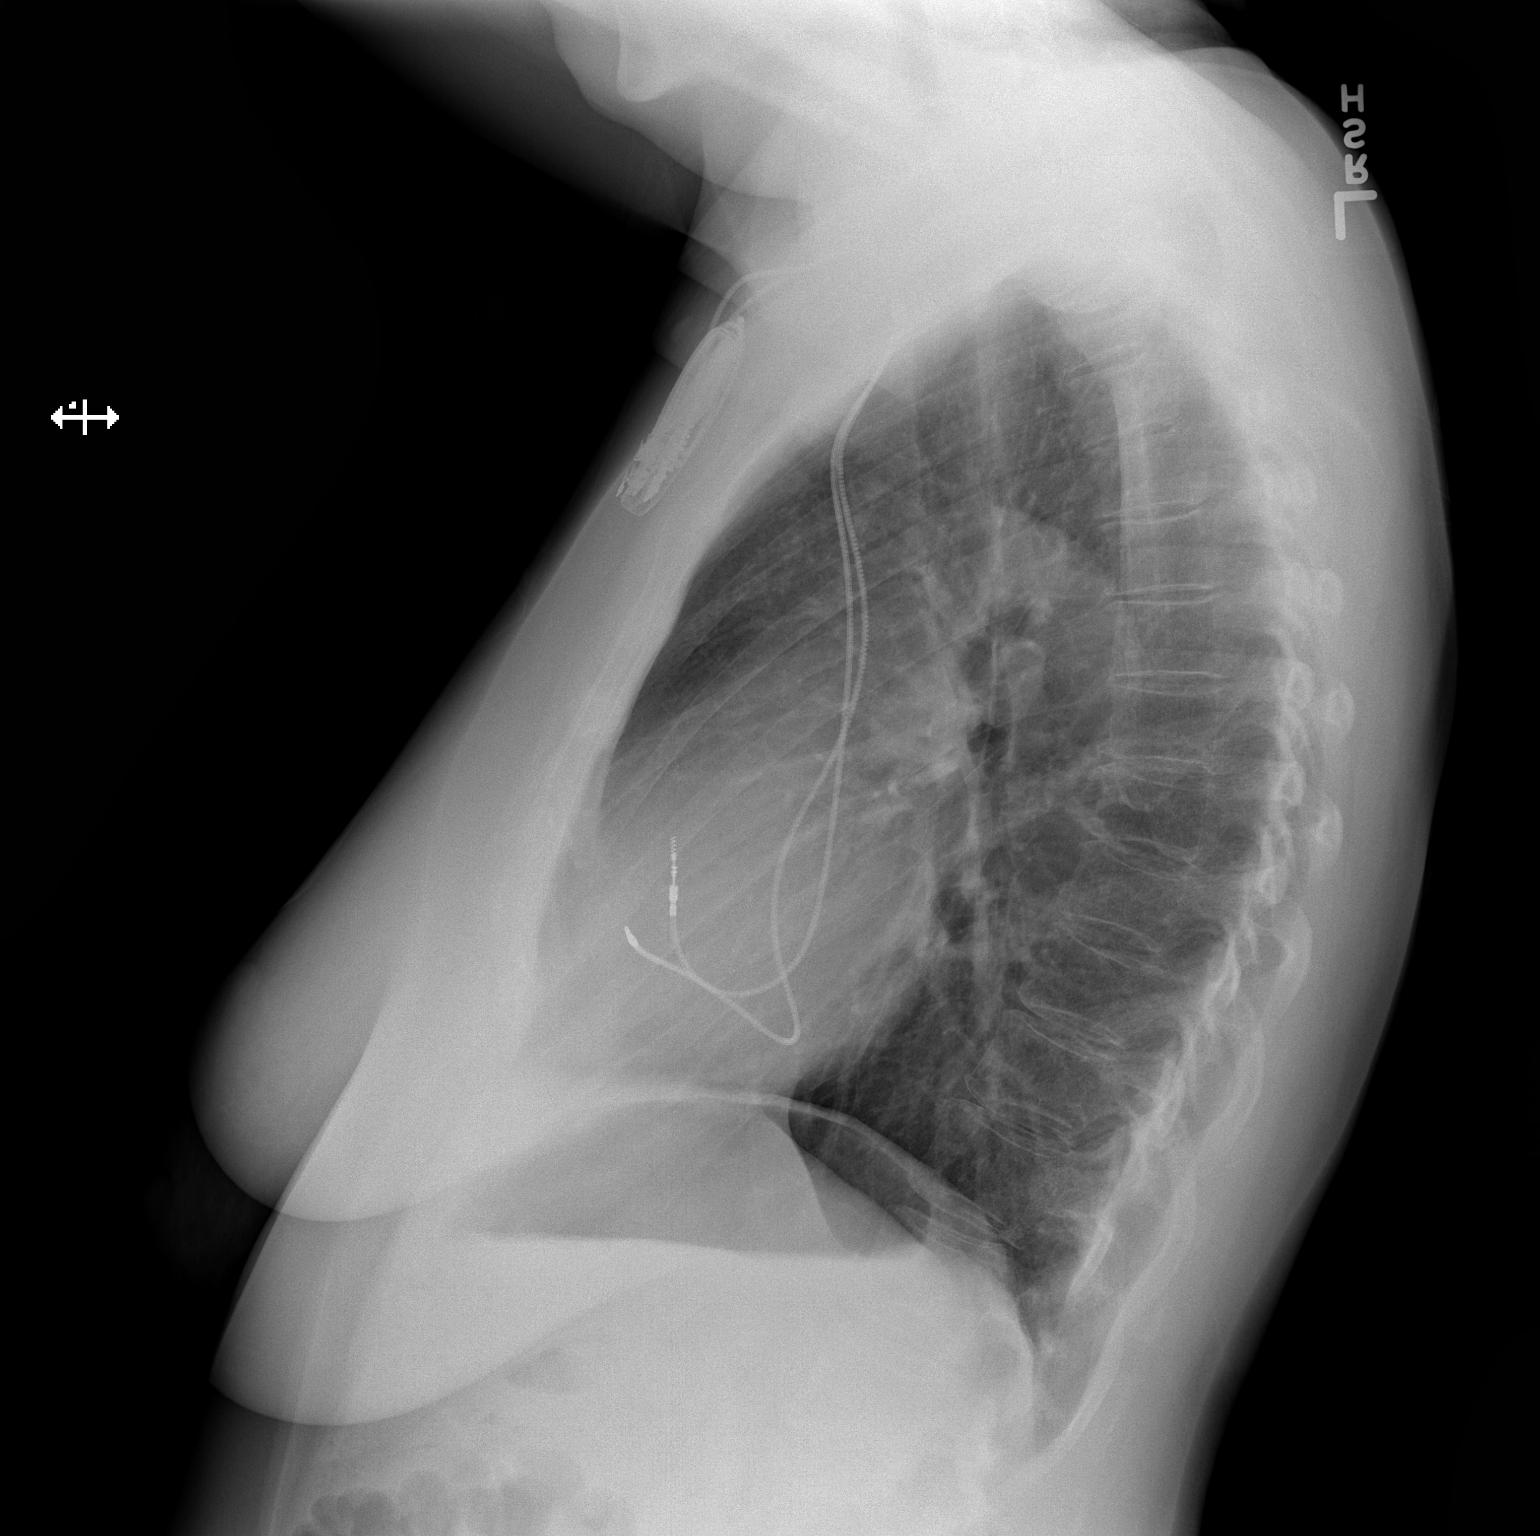

[2 of 2 positions shown; findings below may reference images not displayed]

FINDINGS: Cardiomediastinal silhouette is stable. Dual lead cardiac pacemaker
is unchanged in position. No acute infiltrate or pleural effusion.
No pulmonary edema. Bony thorax is stable.
IMPRESSION: No active disease.  Dual lead cardiac pacemaker in place.

## 2017-07-14 ENCOUNTER — Telehealth: Payer: Self-pay | Admitting: *Deleted

## 2017-07-14 NOTE — Telephone Encounter (Signed)
Bone Density Scan  T- Score -2.1  Osteopenia  6% improvement Calcium  Vitamin D  Resistive Exercises Repeat in 2 years

## 2017-07-18 ENCOUNTER — Other Ambulatory Visit: Payer: Self-pay

## 2017-07-18 ENCOUNTER — Encounter: Payer: Self-pay | Admitting: Physician Assistant

## 2017-07-18 ENCOUNTER — Other Ambulatory Visit: Payer: Self-pay | Admitting: Physician Assistant

## 2017-07-18 ENCOUNTER — Ambulatory Visit (INDEPENDENT_AMBULATORY_CARE_PROVIDER_SITE_OTHER): Payer: Medicare Other | Admitting: Physician Assistant

## 2017-07-18 VITALS — BP 130/80 | HR 92 | Temp 98.0°F | Resp 16 | Ht 67.32 in | Wt 165.0 lb

## 2017-07-18 DIAGNOSIS — I10 Essential (primary) hypertension: Secondary | ICD-10-CM | POA: Diagnosis not present

## 2017-07-18 DIAGNOSIS — F329 Major depressive disorder, single episode, unspecified: Secondary | ICD-10-CM

## 2017-07-18 DIAGNOSIS — E119 Type 2 diabetes mellitus without complications: Secondary | ICD-10-CM | POA: Diagnosis not present

## 2017-07-18 DIAGNOSIS — M797 Fibromyalgia: Secondary | ICD-10-CM

## 2017-07-18 DIAGNOSIS — J302 Other seasonal allergic rhinitis: Secondary | ICD-10-CM

## 2017-07-18 DIAGNOSIS — F419 Anxiety disorder, unspecified: Secondary | ICD-10-CM | POA: Diagnosis not present

## 2017-07-18 DIAGNOSIS — R1032 Left lower quadrant pain: Secondary | ICD-10-CM

## 2017-07-18 DIAGNOSIS — R197 Diarrhea, unspecified: Secondary | ICD-10-CM

## 2017-07-18 DIAGNOSIS — I495 Sick sinus syndrome: Secondary | ICD-10-CM | POA: Diagnosis not present

## 2017-07-18 DIAGNOSIS — F32A Depression, unspecified: Secondary | ICD-10-CM

## 2017-07-18 LAB — POCT URINALYSIS DIP (MANUAL ENTRY)
Bilirubin, UA: NEGATIVE
GLUCOSE UA: NEGATIVE mg/dL
Ketones, POC UA: NEGATIVE mg/dL
NITRITE UA: NEGATIVE
Protein Ur, POC: NEGATIVE mg/dL
SPEC GRAV UA: 1.02 (ref 1.010–1.025)
Urobilinogen, UA: 0.2 E.U./dL
pH, UA: 6 (ref 5.0–8.0)

## 2017-07-18 LAB — GLUCOSE, POCT (MANUAL RESULT ENTRY): POC GLUCOSE: 99 mg/dL (ref 70–99)

## 2017-07-18 LAB — POCT GLYCOSYLATED HEMOGLOBIN (HGB A1C): Hemoglobin A1C: 6

## 2017-07-18 MED ORDER — TRAZODONE HCL 50 MG PO TABS
25.0000 mg | ORAL_TABLET | Freq: Every day | ORAL | 3 refills | Status: DC
Start: 2017-07-18 — End: 2019-03-29

## 2017-07-18 MED ORDER — OXYCODONE-ACETAMINOPHEN 10-325 MG PO TABS: 1.5000 | ORAL_TABLET | Freq: Two times a day (BID) | ORAL | 0 refills | Status: DC

## 2017-07-18 NOTE — Patient Instructions (Signed)
     IF you received an x-ray today, you will receive an invoice from [facility]. Please contact [facility] at [phone] with questions or concerns regarding your invoice.   IF you received labwork today, you will receive an invoice from [facility]. Please contact [facility] at [phone] with questions or concerns regarding your invoice.   Our billing staff will not be able to assist you with questions regarding bills from these companies.  You will be contacted with the lab results as soon as they are available. The fastest way to get your results is to activate your My Chart account. Instructions are located on the last page of this paperwork. If you have not heard from us regarding the results in 2 weeks, please contact this office.     

## 2017-07-18 NOTE — Progress Notes (Signed)
Patient ID: [name], female    DOB: [birth year], 62 y.o.   MRN: [id number]  PCP: [name], PA-C  Chief Complaint  Patient presents with  . Chronic Conditions    DM,HTN,HLD 3 month follow-up   . Abdominal Pain    pt states she has been having pain x 3 weeks with Diarrhea     Subjective:   Presents for evaluation of diabetes, HTN and hyperlipidemia. In addition, she reports 3 weeks of LLQ abdominal pain and diarrhea.  Home glucose today 133. Needs education on how to use her glucometer. Trying to make healthy eating changes. Not getting exercise due to fibromyalgia pain and lack of motivation from depression, and anxiety. She would like to get back in to PT, but needs a referral.  Continues to meet with her therapist regularly. Advised to talk with me about adjusting her medication regimen. Doesn't want to take additional medication, but is willing to try. Tolerates escitalopram, but recall that she was very reluctant to take it and initially took only 5 mg and attributed multiple complaints to it.  Has known diverticulosis and diverticulitis, as well as hemorrhoids. She has taken some Cipro, prescribed by her GI specialist.    Review of Systems As above  Depression screen [facility] 2/9 07/18/2017 03/24/2017 03/24/2017 02/23/2017 12/20/2016  Decreased Interest 1 0 0 0 0  Down, Depressed, Hopeless 1 0 0 0 0  PHQ - 2 Score 2 0 0 0 0  Altered sleeping 1 - 3 - -  Tired, decreased energy 1 - 3 - -  Change in appetite 1 - 3 - -  Feeling bad or failure about yourself  1 - 3 - -  Trouble concentrating 0 - 0 - -  Moving slowly or fidgety/restless 0 - 0 - -  Suicidal thoughts 0 - 0 - -  PHQ-9 Score 6 - 12 - -  Difficult doing work/chores - - Somewhat difficult - -  Some recent data might be hidden     Patient Active Problem List   Diagnosis Date Noted  . Acute coronary syndrome ([place]) 02/09/2017  . Right knee pain 07/04/2016  . Pain in right ankle and joints of  right foot 07/04/2016  . Psoriasis 03/10/2015  . Right foot pain 11/13/2014  . Ethmoid sinusitis 11/13/2014  . Abdominal pain, epigastric 11/13/2014  . Vitamin D deficiency 11/13/2014  . 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 Sick sinus syndrome ([place]) 07/08/2014  . Chest pain at rest 07/05/2014  . Chronic rhinitis 10/22/2013  . Fatty liver 10/22/2013  . Interstitial cystitis 10/22/2013  . Hip pain, right 10/22/2013  . Chronic insomnia 09/25/2012  . HTN (hypertension) 09/25/2012  . Hyperlipidemia 09/25/2012  . Diabetes mellitus type 2, controlled, without complications ([place]) 26/37/8588  . GERD 12/04/2008  . History of colonic polyps 12/04/2008  . CONSTIPATION 10/21/2008  . Fibromyalgia 10/21/2008  . LACTOSE INTOLERANCE 11/20/2007  . Chest pain, precordial 11/20/2007  . Anxiety and depression 08/02/2007  . Osteoporosis 08/02/2007  . Diverticulosis of large intestine 11/16/2006  . EXTERNAL HEMORRHOIDS 05/12/2005     Prior to Admission medications   Medication Sig Start Date End Date Taking? Authorizing Provider  Azilsartan Medoxomil (EDARBI) 40 MG TABS Take 40 mg by mouth daily.    Yes [provider]  beclomethasone (QVAR) 40 MCG/ACT inhaler Inhale 2 puffs into the lungs 2 (two) times daily as needed (for shortness of breath).    Yes [provider]  Blood Glucose Monitoring  Suppl (ACCU-CHEK GUIDE) w/Device KIT USE DAILY AS DIRECTED 05/17/17  Yes Vershawn Westrup, PA-C  cetirizine (ZYRTEC) 10 MG tablet TAKE 1 TABLET (10 MG TOTAL) BY MOUTH DAILY. 07/18/17  Yes Richel Millspaugh, PA-C  cholecalciferol (VITAMIN D) 1000 units tablet Take 1 tablet (1,000 Units total) by mouth daily. 09/13/16  Yes Rishith Siddoway, PA-C  ciprofloxacin (CIPRO) 500 MG tablet Take 500 mg by mouth 2 (two) times daily as needed (for diverticulitis).   Yes [provider]  cyclobenzaprine (FLEXERIL) 10 MG tablet Take 10 mg by mouth 3 (three) times daily as needed for muscle spasms.    Yes  [provider]  Desoximetasone (TOPICORT) 0.25 % ointment Apply 1 application topically 2 (two) times daily as needed (for psoriasis). 11/13/14  Yes Olufemi Mofield, PA-C  dexlansoprazole (DEXILANT) 60 MG capsule Take 60 mg by mouth daily as needed (for acid reflux).    Yes [provider]  diazepam (VALIUM) 5 MG tablet Take 1 tablet (5 mg total) by mouth daily as needed for anxiety or muscle spasms. 11/26/15  Yes Jalyiah Shelley, PA-C  dicyclomine (BENTYL) 10 MG capsule TAKE 1 CAPSULE (10 MG TOTAL) BY MOUTH 4 (FOUR) TIMES DAILY - BEFORE MEALS AND AT BEDTIME. Patient taking differently: Take 10 mg by mouth 4 (four) times daily as needed for spasms.  12/31/16  Yes Nuh Lipton, PA-C  diphenoxylate-atropine (LOMOTIL) 2.5-0.025 MG tablet Take 2 tablets by mouth 4 (four) times daily as needed for diarrhea or loose stools.    Yes [provider]  docusate sodium (COLACE) 100 MG capsule TAKE 1 CAPSULE (100 MG TOTAL) BY MOUTH DAILY AS NEEDED FOR MILD CONSTIPATION. 10/23/14  Yes Judy Pollman, PA-C  escitalopram (LEXAPRO) 20 MG tablet TAKE 1 TABLET EVERY DAY Patient taking differently: Take 20 mg by mouth daily 11/24/16  Yes Mithcell Schumpert, PA-C  ezetimibe (ZETIA) 10 MG tablet Take 10 mg by mouth every evening.    Yes [provider]  fluticasone (FLONASE) 50 MCG/ACT nasal spray USE 2 SPRAYS INTO EACH NOSTRIL TWICE DAILY Patient taking differently: Place 2 sprays into both nostrils 2 (two) times daily.  09/13/16  Yes Dago Jungwirth, PA-C  magic mouthwash w/lidocaine SOLN Take 10 mLs by mouth every 2 (two) hours as needed for mouth pain. 03/24/17  Yes Dionne Rossa, PA-C  meloxicam (MOBIC) 15 MG tablet TAKE 1 TABLET BY MOUTH EVERY DAY Patient taking differently: TAKE 15 MG BY MOUTH EVERY DAY 02/24/16  Yes Davanee Klinkner, PA-C  metoprolol tartrate (LOPRESSOR) 25 MG tablet Take 0.5 tablets (12.5 mg total) by mouth 2 (two) times daily. 02/11/17  Yes Dixie Dials, MD    Multiple Vitamin (MULTIVITAMIN) tablet Take 1 tablet by mouth daily.   Yes [provider]  NITROSTAT 0.4 MG SL tablet Place 0.4 mg under the tongue every 5 (five) minutes as needed for chest pain. For chest pain. 11/24/11  Yes [provider]  olopatadine (PATANOL) 0.1 % ophthalmic solution Place 1 drop into both eyes 2 (two) times daily.   Yes [provider]  ondansetron (ZOFRAN ODT) 4 MG disintegrating tablet Take 1 tablet (4 mg total) by mouth every 8 (eight) hours as needed for nausea. 11/20/16  Yes Larene Pickett, PA-C  oxyCODONE-acetaminophen (PERCOCET) 10-325 MG tablet Take 1.5 tablets by mouth 2 (two) times daily. 06/12/17  Yes Tynslee Bowlds, PA-C  PROAIR HFA 108 (90 Base) MCG/ACT inhaler INHALE 2 PUFFS 4 TIMES A DAY AS NEEDED Patient taking differently: INHALE 2 PUFFS 4 TIMES A  DAY AS NEEDED FOR SHORTNESS OF BREATH 09/30/16  Yes Dashea Mcmullan, PA-C  Probiotic Product (PROBIOTIC DAILY) CAPS Take 1 capsule by mouth daily.    Yes [provider]  ranitidine (ZANTAC) 150 MG tablet Take 150 mg by mouth daily as needed for heartburn.    Yes [provider]  zolpidem (AMBIEN) 10 MG tablet Take 5-10 mg by mouth at bedtime as needed for sleep.  09/28/13  Yes [provider]     Allergies  Allergen Reactions  . Codeine Other (See Comments)    HALLUCINATIONS   . Propoxyphene N-Acetaminophen Other (See Comments)    HALLUCINATIONS  . Sulfonamide Derivatives Hives, Itching and Swelling  . Alka-Seltzer [Aspirin Effervescent] Other (See Comments)    "like she's fading away", jittery   . Norvasc [Amlodipine Besylate] Other (See Comments)    "passed out"  . Red Dye Other (See Comments)    "burning and tingling sensation"  . Singulair [Montelukast Sodium] Other (See Comments)    Increased depression, sadness  . Iohexol Nausea Only    JITTERY    . Latex Hives and Itching       Objective:  Physical Exam  Constitutional: She is  oriented to person, place, and time. She appears well-developed and well-nourished. She is active and cooperative. No distress.  BP 130/80   Pulse 92   Temp 98 F (36.7 C) (Oral)   Resp 16   Ht 5' 7.32" (1.71 m)   Wt 165 lb (74.8 kg)   SpO2 98%   BMI 25.60 kg/m   HENT:  Head: Normocephalic and atraumatic.  Right Ear: Hearing normal.  Left Ear: Hearing normal.  Eyes: Conjunctivae are normal. No scleral icterus.  Neck: Normal range of motion. Neck supple. No thyromegaly present.  Cardiovascular: Normal rate, regular rhythm and normal heart sounds.  Pulses:      Radial pulses are 2+ on the right side, and 2+ on the left side.  Pulmonary/Chest: Effort normal and breath sounds normal.  Abdominal: There is tenderness in the left lower quadrant.  Musculoskeletal:       Right hip: She exhibits normal range of motion.       Left hip: She exhibits normal range of motion.  Lymphadenopathy:       Head (right side): No tonsillar, no preauricular, no posterior auricular and no occipital adenopathy present.       Head (left side): No tonsillar, no preauricular, no posterior auricular and no occipital adenopathy present.    She has no cervical adenopathy.       Right: No supraclavicular adenopathy present.       Left: No supraclavicular adenopathy present.  Neurological: She is alert and oriented to person, place, and time. No sensory deficit.  Skin: Skin is warm, dry and intact. No rash noted. No cyanosis or erythema. Nails show no clubbing.  Psychiatric: She has a normal mood and affect. Her speech is normal and behavior is normal.       Results for orders placed or performed in visit on 07/18/17  POCT glucose (manual entry)  Result Value Ref Range   POC Glucose 99 70 - 99 mg/dl  POCT glycosylated hemoglobin (Hb A1C)  Result Value Ref Range   Hemoglobin A1C 6.0        Assessment & Plan:   Problem List Items Addressed This Visit    Anxiety and depression (Chronic)    Inadequate  control. Add trazodone at HS.      Relevant  Medications   traZODone (DESYREL) 50 MG tablet   Diabetes mellitus type 2, controlled, without complications ([place]) - Primary (Chronic)    Await lab results. Currently controlled with lifestyle changes. Add medication if A1C >7%.      Relevant Orders   Comprehensive metabolic panel (Completed)   Lipid panel (Completed)   POCT glucose (manual entry) (Completed)   POCT glycosylated hemoglobin (Hb A1C) (Completed)   HTN (hypertension) (Chronic)    Controlled. Continue current treatment.      Relevant Orders   CBC with Differential/Platelet (Completed)   Comprehensive metabolic panel (Completed)   TSH (Completed)   POCT urinalysis dipstick (Completed)   Fibromyalgia    Resume PT. Referral placed. Will need gentle program initially.      Relevant Medications   oxyCODONE-acetaminophen (PERCOCET) 10-325 MG tablet   Other Relevant Orders   Ambulatory referral to Physical Therapy   Sick sinus syndrome ([place])    Continue per cardiology.       Other Visit Diagnoses    Left lower quadrant pain       Relevant Orders   CT Abdomen Pelvis W Contrast   Diarrhea, unspecified type       Relevant Orders   Clostridium Difficile by PCR   Ova and parasite examination   Stool culture   POCT urinalysis dipstick (Completed)       Return in about 3 months (around 10/15/2017) for re-evaluation of diabetes, mood, etc.   Fara Chute, PA-C [facility]

## 2017-07-19 LAB — COMPREHENSIVE METABOLIC PANEL
ALK PHOS: 64 IU/L (ref 39–117)
ALT: 49 IU/L — ABNORMAL HIGH (ref 0–32)
AST: 27 IU/L (ref 0–40)
Albumin/Globulin Ratio: 1.7 (ref 1.2–2.2)
Albumin: 4.8 g/dL (ref 3.6–4.8)
BILIRUBIN TOTAL: 0.6 mg/dL (ref 0.0–1.2)
BUN/Creatinine Ratio: 13 (ref 12–28)
BUN: 9 mg/dL (ref 8–27)
CHLORIDE: 103 mmol/L (ref 96–106)
CO2: 26 mmol/L (ref 20–29)
CREATININE: 0.71 mg/dL (ref 0.57–1.00)
Calcium: 9.8 mg/dL (ref 8.7–10.3)
GFR calc Af Amer: 106 mL/min/{1.73_m2} (ref >59)
GFR calc non Af Amer: 92 mL/min/{1.73_m2} (ref >59)
GLUCOSE: 103 mg/dL — AB (ref 65–99)
Globulin, Total: 2.9 g/dL (ref 1.5–4.5)
Potassium: 4.6 mmol/L (ref 3.5–5.2)
Sodium: 144 mmol/L (ref 134–144)
Total Protein: 7.7 g/dL (ref 6.0–8.5)

## 2017-07-19 LAB — CBC WITH DIFFERENTIAL/PLATELET
BASOS ABS: 0 10*3/uL (ref 0.0–0.2)
Basos: 0 %
EOS (ABSOLUTE): 0.1 10*3/uL (ref 0.0–0.4)
Eos: 1 %
Hematocrit: 37.3 % (ref 34.0–46.6)
Hemoglobin: 13.1 g/dL (ref 11.1–15.9)
Immature Grans (Abs): 0 10*3/uL (ref 0.0–0.1)
Immature Granulocytes: 0 %
LYMPHS ABS: 2.5 10*3/uL (ref 0.7–3.1)
Lymphs: 39 %
MCH: 27.3 pg (ref 26.6–33.0)
MCHC: 35.1 g/dL (ref 31.5–35.7)
MCV: 78 fL — ABNORMAL LOW (ref 79–97)
MONOCYTES: 8 %
MONOS ABS: 0.5 10*3/uL (ref 0.1–0.9)
Neutrophils Absolute: 3.4 10*3/uL (ref 1.4–7.0)
Neutrophils: 52 %
Platelets: 331 10*3/uL (ref 150–379)
RBC: 4.8 x10E6/uL (ref 3.77–5.28)
RDW: 13.4 % (ref 12.3–15.4)
WBC: 6.5 10*3/uL (ref 3.4–10.8)

## 2017-07-19 LAB — LIPID PANEL
CHOLESTEROL TOTAL: 187 mg/dL (ref 100–199)
Chol/HDL Ratio: 5.1 ratio — ABNORMAL HIGH (ref 0.0–4.4)
HDL: 37 mg/dL — ABNORMAL LOW (ref >39)
LDL CALC: 134 mg/dL — AB (ref 0–99)
TRIGLYCERIDES: 81 mg/dL (ref 0–149)
VLDL CHOLESTEROL CAL: 16 mg/dL (ref 5–40)

## 2017-07-19 LAB — TSH: TSH: 1.12 u[IU]/mL (ref 0.450–4.500)

## 2017-07-19 NOTE — Telephone Encounter (Signed)
Patient advised of results and recommendations. Patient verbalized understanding.

## 2017-07-20 NOTE — Progress Notes (Signed)
Office Visit Note  Patient: [name]             Date of Birth: 07-28-1955           MRN: [id number]             PCP: [name], PA-C Referring: [name], PA-C Visit Date: 07/27/2017 Occupation: @GUAROCC @    Subjective:  Generalized pain    History of Present Illness: [name] is a 62 y.o. female with history of fibromyalgia, osteopenia, and DDD.  Patient reports generalized pain due to her fibromyalgia.  She is no longer taking Flexeril.  She takes Ambien occasionally to help her sleep.  She states she has very interrupted sleep, but she does not like taking Ambien.  She states that melatonin is not effective for her.  She states that her lower back pain has improved since going to physical therapy.  She has two more weeks of PT left.  She states that she avoid lifting heavy objects.  She states she experiences muscle aches and joint pain with weather changes.  She reports she has psoriasis on her left foot.    Activities of Daily Living:  Patient reports morning stiffness for 30-40 minutes.   Patient Reports nocturnal pain.  Difficulty dressing/grooming: Denies Difficulty climbing stairs: Reports Difficulty getting out of chair: Denies Difficulty using hands for taps, buttons, cutlery, and/or writing: Denies   Review of Systems  Constitutional: Negative for fatigue and weakness.  HENT: Positive for mouth sores and mouth dryness. Negative for nose dryness.   Eyes: Positive for dryness. Negative for pain, redness and visual disturbance.  Respiratory: Positive for cough. Negative for hemoptysis, shortness of breath and difficulty breathing.   Cardiovascular: Positive for palpitations. Negative for chest pain, hypertension, irregular heartbeat and swelling in legs/feet.  Gastrointestinal: Positive for diarrhea. Negative for blood in stool and constipation.  Endocrine: Negative for increased urination.  Genitourinary: Negative for painful urination.    Musculoskeletal: Positive for arthralgias, joint pain, joint swelling, myalgias, morning stiffness, muscle tenderness and myalgias. Negative for muscle weakness.  Skin: Positive for rash (Psoriasis). Negative for color change, pallor, hair loss, nodules/bumps, skin tightness, ulcers and sensitivity to sunlight.  Allergic/Immunologic: Negative for susceptible to infections.  Neurological: Negative for dizziness, numbness and headaches.  Hematological: Positive for swollen glands.  Psychiatric/Behavioral: Positive for depressed mood and sleep disturbance. The patient is nervous/anxious.     PMFS History:  Patient Active Problem List   Diagnosis Date Noted  . Acute coronary syndrome ([place]) 02/09/2017  . Right knee pain 07/04/2016  . Pain in right ankle and joints of right foot 07/04/2016  . Psoriasis 03/10/2015  . Right foot pain 11/13/2014  . Ethmoid sinusitis 11/13/2014  . Abdominal pain, epigastric 11/13/2014  . Vitamin D deficiency 11/13/2014  . 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 Sick sinus syndrome ([place]) 07/08/2014  . Chest pain at rest 07/05/2014  . Chronic rhinitis 10/22/2013  . Fatty liver 10/22/2013  . Interstitial cystitis 10/22/2013  . Hip pain, right 10/22/2013  . Chronic insomnia 09/25/2012  . HTN (hypertension) 09/25/2012  . Hyperlipidemia 09/25/2012  . Diabetes mellitus type 2, controlled, without complications ([place]) 09/32/3557  . GERD 12/04/2008  . History of colonic polyps 12/04/2008  . CONSTIPATION 10/21/2008  . Fibromyalgia 10/21/2008  . LACTOSE INTOLERANCE 11/20/2007  . Chest pain, precordial 11/20/2007  . Anxiety and depression 08/02/2007  . Osteoporosis 08/02/2007  . Diverticulosis of large intestine 11/16/2006  . EXTERNAL HEMORRHOIDS 05/12/2005  Past Medical History:  Diagnosis Date  . Allergy   . Anal fissure 02/2012  . Anxiety   . Arthritis   . Asthma   . Bursitis    right hip  . Complication of anesthesia     woke up during a colonscopy,  difficult to wake up post-op sometimes , requires lots of blankets to stay warm post op  . Depression   . Diabetes mellitus without complication ([place])    type 2 diet controlled  . Fatty liver    sees dr Collene Mares for  . Fibromyalgia   . Gallstones 02/15/2015  . GERD (gastroesophageal reflux disease)   . Hepatitis A infection 1979  . Hyperlipidemia   . Hypertension   . Irritable bowel syndrome (IBS)   . 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 Psoriasis    left foot  . 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 Tarsal tunnel syndrome of left side     Family History  Problem Relation Age of Onset  . Cancer Mother        liver  . Diabetes Mother   . Hyperlipidemia Mother   . Hypertension Mother   . Stroke Mother   . Cancer Maternal Grandmother        breast  . Anesthesia problems Sister        hard to wake up post-op  . Heart disease Father   . Hyperlipidemia Brother   . HIV/AIDS Brother   . Cancer Maternal Grandfather        colon  . Diabetes Brother   . Cancer Brother        lung  . Cancer Paternal Grandfather        COLON   Past Surgical History:  Procedure Laterality Date  . ABDOMINAL HYSTERECTOMY  09/1994   partial  . APPENDECTOMY    . BILATERAL SALPINGOOPHORECTOMY Bilateral 04/2011  . CARDIAC CATHETERIZATION  10/08/2007  . CESAREAN SECTION     x 1  . CHOLECYSTECTOMY N/A 02/16/2015   Procedure: LAPAROSCOPIC CHOLECYSTECTOMY WITH INTRAOPERATIVE CHOLANGIOGRAM;  Surgeon: Johnathan Hausen, MD;  Location: [facility];  Service: General;  Laterality: N/A;  . COLONOSCOPY WITH PROPOFOL N/A 08/19/2016   Procedure: COLONOSCOPY WITH PROPOFOL;  Surgeon: Carol Ada, MD;  Location: [facility];  Service: Endoscopy;  Laterality: N/A;  . CYSTOSCOPY W/ URETERAL STENT PLACEMENT  08/04/2009  . EXAMINATION UNDER ANESTHESIA  02/17/2012   Procedure: EXAM UNDER ANESTHESIA;  Surgeon: Pedro Earls, MD;  Location:  [facility];  Service: General;  Laterality: N/A;  Exam under anesthesia with lateral internal sphincterotomy  . LAPAROSCOPIC LYSIS INTESTINAL ADHESIONS  08/04/2009  . LEFT HEART CATH AND CORONARY ANGIOGRAPHY N/A 02/10/2017   Procedure: LEFT HEART CATH AND CORONARY ANGIOGRAPHY;  Surgeon: Dixie Dials, MD;  Location: [facility];  Service: Cardiovascular;  Laterality: N/A;  . PERMANENT PACEMAKER INSERTION N/A 07/08/2014   MDT MRI compatible dual chamber pacemaker implanted by Dr Rayann Heman for symptomatic bradycardia  . SHOULDER ARTHROSCOPY W/ ROTATOR CUFF REPAIR Left 1999  . SMALL INTESTINE SURGERY    . SPHINCTEROTOMY  02/17/2012   Procedure: SPHINCTEROTOMY;  Surgeon: Pedro Earls, MD;  Location: [facility];  Service: General;  Laterality: N/A;   Social History   Social History Narrative   Lives alone.  Daughter and granddaughter live in [place], [place]. Pt exercise sometimes.  Objective: Vital Signs: BP 120/83 (BP Location: Right Arm, Patient Position: Sitting, Cuff Size: Normal)   Pulse 68   Resp 17   Ht 5' 7"  (1.702 m)   Wt 169 lb (76.7 kg)   BMI 26.47 kg/m    Physical Exam  Constitutional: She is oriented to person, place, and time. She appears well-developed and well-nourished.  HENT:  Head: Normocephalic and atraumatic.  Eyes: Conjunctivae and EOM are normal.  Neck: Normal range of motion.  Cardiovascular: Normal rate, regular rhythm, normal heart sounds and intact distal pulses.  Pulmonary/Chest: Effort normal and breath sounds normal.  Abdominal: Soft. Bowel sounds are normal.  Lymphadenopathy:    She has no cervical adenopathy.  Neurological: She is alert and oriented to person, place, and time.  Skin: Skin is warm and dry. Capillary refill takes less than 2 seconds.  Psychiatric: She has a normal mood and affect. Her behavior is normal.  Nursing note and vitals reviewed.    Musculoskeletal Exam: C-spine, thoracic, and lumbar  spine good ROM.  Shoulder joints elbow joints, wrist joints, MCPs, PIPs, and DIPs good ROM with no synovitis.  Hip joints, knee joints, ankle joints good ROM with no synovitis.  No warmth or effusion of knees.  No knee crepitus.  She has generalized hyperalgesia on exam.  She has trapezius muscle tension and muscle tenderness.  She has no trochanteric bursa tenderness.  No midline spinal tenderness or SI joint tenderness.   CDAI Exam: No CDAI exam completed.    Investigation: No additional findings. CBC Latest Ref Rng & Units 07/18/2017 03/24/2017 02/11/2017  WBC 3.4 - 10.8 x10E3/uL 6.5 6.0 8.5  Hemoglobin 11.1 - 15.9 g/dL 13.1 13.0 12.5  Hematocrit 34.0 - 46.6 % 37.3 40.6 38.3  Platelets 150 - 379 x10E3/uL 331 304 295   CMP Latest Ref Rng & Units 07/18/2017 03/24/2017 02/11/2017  Glucose 65 - 99 mg/dL 103(H) 92 141(H)  BUN 8 - 27 mg/dL 9 9 9   Creatinine 0.57 - 1.00 mg/dL 0.71 0.71 0.65  Sodium 134 - 144 mmol/L 144 140 139  Potassium 3.5 - 5.2 mmol/L 4.6 4.3 4.1  Chloride 96 - 106 mmol/L 103 101 109  CO2 20 - 29 mmol/L 26 22 22   Calcium 8.7 - 10.3 mg/dL 9.8 9.8 9.1  Total Protein 6.0 - 8.5 g/dL 7.7 7.7 -  Total Bilirubin 0.0 - 1.2 mg/dL 0.6 0.6 -  Alkaline Phos 39 - 117 IU/L 64 64 -  AST 0 - 40 IU/L 27 33 -  ALT 0 - 32 IU/L 49(H) 51(H) -    Imaging: No results found.  Speciality Comments: No specialty comments available.    Procedures:  Trigger Point Inj Date/Time: 07/27/2017 4:23 PM Performed by: Ofilia Neas, PA-C Authorized by: Ofilia Neas, PA-C   Consent Given by:  Patient Site marked: the procedure site was marked   Timeout: prior to procedure the correct patient, procedure, and site was verified   Indications:  Therapeutic Total # of Trigger Points:  2 Location: neck   Needle Size:  27 G Approach:  Dorsal (Trigger point injections bilateral trapezius muscles) Medications #1:  0.5 mL lidocaine 1 %; 10 mg triamcinolone acetonide 40 MG/ML Medications #2:  0.5 mL  lidocaine 1 %; 10 mg triamcinolone acetonide 40 MG/ML Patient tolerance:  Patient tolerated the procedure well with no immediate complications   Allergies: Codeine; Propoxyphene n-acetaminophen; Sulfonamide derivatives; Alka-seltzer [aspirin effervescent]; Norvasc [amlodipine besylate]; Red dye; Singulair [montelukast sodium]; Bupropion; Iohexol; and Latex  Assessment / Plan:     Visit Diagnoses: Fibromyalgia: She has generalized hyperalgesia on exam.  She has trapezius muscle tension and muscle tenderness.  She requested bilateral trapezius cortisone injections.  She tolerated the procedure well.  She was encouraged to start exercising on a regular basis.  Good sleep hygiene was discussed.    Chronic insomnia: She has a prescription for Ambien 10 mg that she takes sparingly.  Good sleep hygiene was discussed.    Osteopenia, unspecified location - 01/06/42-B score -2.1, Calicum and vitamin D supplements, repeat DEXA in 2 years   Vitamin D deficiency: She takes vitamin D 1,000 units daily.   DDD (degenerative disc disease), lumbar: She has no midline spinal tenderness.  She has been going to physical therapy, which has been improving her pain and ROM.  She avoid lifting heavy objects.  She has 2 weeks of PT left.    Psoriasis: She has psoriasis on her left foot.  She uses topical creams.    Tarsal tunnel syndrome, unspecified laterality: She has no discomfort at this time.   Trapezius muscle spasm -She has bilateral trapezius muscle tension and muscle tenderness on exam.  She requested bilateral trapezius cortisone injections.  She tolerated the procedure well.   She was advised to check her blood glucose regularly. Plan: Trigger Point Inj   Other medical conditions are listed as follows:   Essential hypertension  Sick sinus syndrome ([place]) - Pacemaker 2106  Anxiety and depression  History of asthma  History of gastroesophageal reflux (GERD)  History of IBS  History of  hypercholesterolemia  Diverticulosis of large intestine without hemorrhage     Orders: Orders Placed This Encounter  Procedures  . Trigger Point Inj   No orders of the defined types were placed in this encounter.   Face-to-face time spent with patient was 30  minutes. Greater than 50% of time was spent in counseling and coordination of care.  Follow-Up Instructions: Return in about 6 months (around 01/24/2018) for Fibromyalgia.   Ofilia Neas, PA-C  Note - This record has been created using Dragon software.  Chart creation errors have been sought, but may not always  have been located. Such creation errors do not reflect on  the standard of medical care.

## 2017-07-25 ENCOUNTER — Other Ambulatory Visit: Payer: Self-pay | Admitting: Physician Assistant

## 2017-07-25 DIAGNOSIS — J310 Chronic rhinitis: Secondary | ICD-10-CM

## 2017-07-27 ENCOUNTER — Ambulatory Visit (INDEPENDENT_AMBULATORY_CARE_PROVIDER_SITE_OTHER): Payer: Medicare Other | Admitting: Physician Assistant

## 2017-07-27 ENCOUNTER — Encounter: Payer: Self-pay | Admitting: Physician Assistant

## 2017-07-27 VITALS — BP 120/83 | HR 68 | Resp 17 | Ht 67.0 in | Wt 169.0 lb

## 2017-07-27 DIAGNOSIS — I10 Essential (primary) hypertension: Secondary | ICD-10-CM

## 2017-07-27 DIAGNOSIS — L409 Psoriasis, unspecified: Secondary | ICD-10-CM

## 2017-07-27 DIAGNOSIS — F329 Major depressive disorder, single episode, unspecified: Secondary | ICD-10-CM

## 2017-07-27 DIAGNOSIS — M62838 Other muscle spasm: Secondary | ICD-10-CM

## 2017-07-27 DIAGNOSIS — F5104 Psychophysiologic insomnia: Secondary | ICD-10-CM

## 2017-07-27 DIAGNOSIS — I495 Sick sinus syndrome: Secondary | ICD-10-CM

## 2017-07-27 DIAGNOSIS — M8580 Other specified disorders of bone density and structure, unspecified site: Secondary | ICD-10-CM | POA: Diagnosis not present

## 2017-07-27 DIAGNOSIS — G5750 Tarsal tunnel syndrome, unspecified lower limb: Secondary | ICD-10-CM | POA: Diagnosis not present

## 2017-07-27 DIAGNOSIS — F32A Depression, unspecified: Secondary | ICD-10-CM

## 2017-07-27 DIAGNOSIS — M797 Fibromyalgia: Secondary | ICD-10-CM

## 2017-07-27 DIAGNOSIS — Z8709 Personal history of other diseases of the respiratory system: Secondary | ICD-10-CM | POA: Diagnosis not present

## 2017-07-27 DIAGNOSIS — F419 Anxiety disorder, unspecified: Secondary | ICD-10-CM

## 2017-07-27 DIAGNOSIS — Z8719 Personal history of other diseases of the digestive system: Secondary | ICD-10-CM | POA: Diagnosis not present

## 2017-07-27 DIAGNOSIS — K5730 Diverticulosis of large intestine without perforation or abscess without bleeding: Secondary | ICD-10-CM

## 2017-07-27 DIAGNOSIS — E559 Vitamin D deficiency, unspecified: Secondary | ICD-10-CM | POA: Diagnosis not present

## 2017-07-27 DIAGNOSIS — M5136 Other intervertebral disc degeneration, lumbar region: Secondary | ICD-10-CM | POA: Diagnosis not present

## 2017-07-27 DIAGNOSIS — Z8639 Personal history of other endocrine, nutritional and metabolic disease: Secondary | ICD-10-CM

## 2017-07-27 MED ORDER — LIDOCAINE HCL 1 % IJ SOLN
0.5000 mL | INTRAMUSCULAR | Status: AC | PRN
  Administered 2017-07-27: .5 mL

## 2017-07-27 MED ORDER — TRIAMCINOLONE ACETONIDE 40 MG/ML IJ SUSP
10.0000 mg | INTRAMUSCULAR | Status: AC | PRN
  Administered 2017-07-27: 10 mg via INTRAMUSCULAR

## 2017-07-28 ENCOUNTER — Telehealth: Payer: Self-pay | Admitting: Physician Assistant

## 2017-07-28 NOTE — Telephone Encounter (Signed)
Copied from [place] [id number]. Topic: Referral - Status >> Jul 28, 2017 10:51 AM Scherrie Gerlach wrote: Reason for CRM:  1. pt states she has not heard from [facility] imaging about MRI/CAT Scan/Imaging, (referral was placed 07/18/17) 2. [facility] has not received the referral placed on 07/18/17  Pt requesting sent asap  ----------------------------------------------------------------------------------------- Order for CT sent to [facility] but has not been scheduled. I advised pt of this and to call them to get an appt set up. Pt stated she had their phone number and would call them. I let pt know we were still waiting to send out PT referral but I would place a message and try to get this sent out next week. We just need OV notes completed to send. Pt very understanding. Thanks!

## 2017-07-29 NOTE — Assessment & Plan Note (Signed)
Continue per cardiology.

## 2017-07-29 NOTE — Assessment & Plan Note (Signed)
Await lab results. Currently controlled with lifestyle changes. Add medication if A1C >7%.

## 2017-07-29 NOTE — Assessment & Plan Note (Signed)
Resume PT. Referral placed. Will need gentle program initially.

## 2017-07-29 NOTE — Telephone Encounter (Signed)
Note complete and encounter closed. Thank you.

## 2017-07-29 NOTE — Assessment & Plan Note (Signed)
Inadequate control. Add trazodone at HS.

## 2017-07-29 NOTE — Assessment & Plan Note (Signed)
Controlled. Continue current treatment. 

## 2017-07-31 NOTE — Telephone Encounter (Signed)
Referral sent. Thanks.

## 2017-08-04 LAB — CLOSTRIDIUM DIFFICILE BY PCR: Toxigenic C. Difficile by PCR: NEGATIVE

## 2017-08-04 LAB — OVA AND PARASITE EXAMINATION

## 2017-08-04 LAB — STOOL CULTURE: E coli, Shiga toxin Assay: NEGATIVE

## 2017-08-08 ENCOUNTER — Telehealth: Payer: Self-pay

## 2017-08-08 DIAGNOSIS — R1032 Left lower quadrant pain: Secondary | ICD-10-CM

## 2017-08-08 NOTE — Telephone Encounter (Signed)
Copied from [place] [phone]. Topic: Referral - Request >> Aug 04, 2017 12:55 PM Scherrie Gerlach wrote: Mary with [facility] imaging states pt is allergic to iodine. Per her choices, pt prefers to do CT without the iodine. Mary with [facility] requesting new order put in for   "CT Abdomen pelvis without" Pt is aware of the change and mary will look for new order in [place] and call the pt to schedule.

## 2017-08-08 NOTE — Telephone Encounter (Signed)
Order pended, provider please sign if agreeable.

## 2017-08-08 NOTE — Telephone Encounter (Signed)
Orders Placed This Encounter  Procedures  . CT Abdomen Pelvis Wo Contrast    Standing Status:   Future    Standing Expiration Date:   11/09/2018    Order Specific Question:   Preferred imaging location?    Answer:   [id number]    Order Specific Question:   Radiology Contrast Protocol - do NOT remove file path    Answer:   [email or web address]

## 2017-08-10 ENCOUNTER — Ambulatory Visit: Payer: Medicare Other | Admitting: Psychiatry

## 2017-08-12 ENCOUNTER — Encounter: Payer: Self-pay | Admitting: Physician Assistant

## 2017-08-12 DIAGNOSIS — M797 Fibromyalgia: Secondary | ICD-10-CM

## 2017-08-14 ENCOUNTER — Ambulatory Visit (INDEPENDENT_AMBULATORY_CARE_PROVIDER_SITE_OTHER): Payer: Medicare Other | Admitting: *Deleted

## 2017-08-14 DIAGNOSIS — I495 Sick sinus syndrome: Secondary | ICD-10-CM | POA: Diagnosis not present

## 2017-08-14 NOTE — Progress Notes (Signed)
Remote pacemaker transmission.   

## 2017-08-15 MED ORDER — OXYCODONE-ACETAMINOPHEN 10-325 MG PO TABS: 1.5000 | ORAL_TABLET | Freq: Two times a day (BID) | ORAL | 0 refills | Status: DC

## 2017-08-15 NOTE — Telephone Encounter (Signed)
Patient is requesting a refill of the following medications: Requested Prescriptions   Pending Prescriptions Disp Refills  . oxyCODONE-acetaminophen (PERCOCET) 10-325 MG tablet 90 tablet 0    Sig: Take 1.5 tablets by mouth 2 (two) times daily.    Date of patient request: 08/12/2017 Last office visit: 07/18/2017 Date of last refill: 07/18/2017 Last refill amount: #90 tabs, no refills Follow up time period per chart: Return in 3 months (10/15/2017). Has OV scheduled with [name] on 10/18/2017.

## 2017-08-15 NOTE — Telephone Encounter (Signed)
Done

## 2017-08-16 ENCOUNTER — Encounter: Payer: Self-pay | Admitting: Cardiology

## 2017-08-17 LAB — CUP PACEART REMOTE DEVICE CHECK
Brady Statistic AP VP Percent: 0.11 %
Brady Statistic AP VS Percent: 99.76 %
Brady Statistic AS VP Percent: 0 %
Brady Statistic RA Percent Paced: 99.69 %
Brady Statistic RV Percent Paced: 0.11 %
Implantable Lead Implant Date: 20160202
Implantable Lead Location: 753859
Implantable Lead Location: 753860
Implantable Lead Model: 5076
Implantable Lead Model: 5076
Implantable Pulse Generator Implant Date: 20160202
Lead Channel Impedance Value: 437 Ohm
Lead Channel Impedance Value: 437 Ohm
Lead Channel Impedance Value: 475 Ohm
Lead Channel Pacing Threshold Pulse Width: 0.4 ms
Lead Channel Sensing Intrinsic Amplitude: 25.125 mV
Lead Channel Sensing Intrinsic Amplitude: 25.125 mV
Lead Channel Setting Pacing Amplitude: 2 V
Lead Channel Setting Pacing Amplitude: 2.5 V
Lead Channel Setting Pacing Pulse Width: 0.4 ms
MDC IDC LEAD IMPLANT DT: 20160202
MDC IDC MSMT BATTERY REMAINING LONGEVITY: 87 mo
MDC IDC MSMT BATTERY VOLTAGE: 3.01 V
MDC IDC MSMT LEADCHNL RA PACING THRESHOLD AMPLITUDE: 0.5 V
MDC IDC MSMT LEADCHNL RA SENSING INTR AMPL: 3.875 mV
MDC IDC MSMT LEADCHNL RA SENSING INTR AMPL: 3.875 mV
MDC IDC MSMT LEADCHNL RV IMPEDANCE VALUE: 551 Ohm
MDC IDC MSMT LEADCHNL RV PACING THRESHOLD AMPLITUDE: 1 V
MDC IDC MSMT LEADCHNL RV PACING THRESHOLD PULSEWIDTH: 0.4 ms
MDC IDC SESS DTM: 20190311160147
MDC IDC SET LEADCHNL RV SENSING SENSITIVITY: 2.8 mV
MDC IDC STAT BRADY AS VS PERCENT: 0.13 %

## 2017-08-31 ENCOUNTER — Other Ambulatory Visit: Payer: Self-pay | Admitting: Physician Assistant

## 2017-08-31 DIAGNOSIS — E559 Vitamin D deficiency, unspecified: Secondary | ICD-10-CM

## 2017-09-05 ENCOUNTER — Ambulatory Visit (INDEPENDENT_AMBULATORY_CARE_PROVIDER_SITE_OTHER): Payer: Medicare Other | Admitting: Psychiatry

## 2017-09-05 DIAGNOSIS — F331 Major depressive disorder, recurrent, moderate: Secondary | ICD-10-CM

## 2017-09-11 ENCOUNTER — Encounter: Payer: Self-pay | Admitting: Physician Assistant

## 2017-09-14 DIAGNOSIS — F331 Major depressive disorder, recurrent, moderate: Secondary | ICD-10-CM

## 2017-09-15 ENCOUNTER — Other Ambulatory Visit: Payer: Self-pay | Admitting: Physician Assistant

## 2017-09-15 ENCOUNTER — Encounter: Payer: Self-pay | Admitting: Physician Assistant

## 2017-09-15 DIAGNOSIS — M797 Fibromyalgia: Secondary | ICD-10-CM

## 2017-09-15 MED ORDER — OXYCODONE-ACETAMINOPHEN 10-325 MG PO TABS: 1.5000 | ORAL_TABLET | Freq: Two times a day (BID) | ORAL | 0 refills | Status: DC

## 2017-09-15 NOTE — Telephone Encounter (Signed)
Patient is requesting a refill of the following medications: Requested Prescriptions   Pending Prescriptions Disp Refills  . oxyCODONE-acetaminophen (PERCOCET) 10-325 MG tablet 90 tablet 0    Sig: Take 1.5 tablets by mouth 2 (two) times daily.    Date of patient request:09/15/2017 Last office visit: 07/18/2017 Date of last refill: 08/15/2017 Last refill amount: 90 Follow up time period per chart:

## 2017-09-20 ENCOUNTER — Ambulatory Visit
Admission: RE | Admit: 2017-09-20 | Discharge: 2017-09-20 | Disposition: A | Discharge status: Home / Self Care | Payer: Medicare Other | Source: Ambulatory Visit | Attending: Physician Assistant | Admitting: Physician Assistant

## 2017-09-20 ENCOUNTER — Other Ambulatory Visit: Payer: Self-pay

## 2017-09-20 DIAGNOSIS — R1032 Left lower quadrant pain: Secondary | ICD-10-CM

## 2017-09-21 ENCOUNTER — Telehealth: Payer: Self-pay

## 2017-09-21 NOTE — Telephone Encounter (Signed)
Copied from [place] [id number]. Topic: Quick Communication - See Telephone Encounter >> Sep 20, 2017 10:12 AM Antonieta Iba C wrote: CRM for notification. See Telephone encounter for: 09/20/17.  Pt called in to request a call back. Pt says that she had a test completed today and she would like to discuss it with her PCP.   CB: [phone]

## 2017-10-17 ENCOUNTER — Encounter: Payer: Self-pay | Admitting: Nurse Practitioner

## 2017-10-18 ENCOUNTER — Encounter: Payer: Self-pay | Admitting: Physician Assistant

## 2017-10-18 ENCOUNTER — Ambulatory Visit (INDEPENDENT_AMBULATORY_CARE_PROVIDER_SITE_OTHER): Payer: Medicare Other | Admitting: Physician Assistant

## 2017-10-18 ENCOUNTER — Other Ambulatory Visit: Payer: Self-pay

## 2017-10-18 VITALS — BP 124/82 | HR 94 | Temp 98.9°F | Resp 18 | Ht 67.32 in | Wt 168.4 lb

## 2017-10-18 DIAGNOSIS — J310 Chronic rhinitis: Secondary | ICD-10-CM | POA: Diagnosis not present

## 2017-10-18 DIAGNOSIS — K5730 Diverticulosis of large intestine without perforation or abscess without bleeding: Secondary | ICD-10-CM

## 2017-10-18 DIAGNOSIS — I10 Essential (primary) hypertension: Secondary | ICD-10-CM | POA: Diagnosis not present

## 2017-10-18 DIAGNOSIS — M797 Fibromyalgia: Secondary | ICD-10-CM

## 2017-10-18 DIAGNOSIS — F419 Anxiety disorder, unspecified: Secondary | ICD-10-CM

## 2017-10-18 DIAGNOSIS — F329 Major depressive disorder, single episode, unspecified: Secondary | ICD-10-CM | POA: Diagnosis not present

## 2017-10-18 DIAGNOSIS — F32A Depression, unspecified: Secondary | ICD-10-CM

## 2017-10-18 DIAGNOSIS — E785 Hyperlipidemia, unspecified: Secondary | ICD-10-CM

## 2017-10-18 DIAGNOSIS — H579 Unspecified disorder of eye and adnexa: Secondary | ICD-10-CM

## 2017-10-18 DIAGNOSIS — I700 Atherosclerosis of aorta: Secondary | ICD-10-CM | POA: Diagnosis not present

## 2017-10-18 DIAGNOSIS — E119 Type 2 diabetes mellitus without complications: Secondary | ICD-10-CM

## 2017-10-18 DIAGNOSIS — E559 Vitamin D deficiency, unspecified: Secondary | ICD-10-CM | POA: Diagnosis not present

## 2017-10-18 MED ORDER — CALCIUM-MAGNESIUM 500-250 MG PO TABS: ORAL_TABLET | ORAL | 3 refills | Status: DC

## 2017-10-18 MED ORDER — OXYCODONE-ACETAMINOPHEN 10-325 MG PO TABS: 1.5000 | ORAL_TABLET | Freq: Two times a day (BID) | ORAL | 0 refills | Status: AC

## 2017-10-18 MED ORDER — AZELASTINE HCL 0.1 % NA SOLN: 2.0000 | Freq: Two times a day (BID) | NASAL | 99 refills | Status: AC

## 2017-10-18 MED ORDER — EZETIMIBE 10 MG PO TABS: 10.0000 mg | ORAL_TABLET | Freq: Every evening | ORAL | 3 refills | Status: AC

## 2017-10-18 MED ORDER — FLUTICASONE PROPIONATE 50 MCG/ACT NA SUSP: 2.0000 | Freq: Every day | NASAL | 12 refills | Status: AC

## 2017-10-18 MED ORDER — OXYCODONE-ACETAMINOPHEN 10-325 MG PO TABS
1.5000 | ORAL_TABLET | Freq: Two times a day (BID) | ORAL | 0 refills | Status: AC
Start: 2017-12-17 — End: 2018-01-16

## 2017-10-18 MED ORDER — ESCITALOPRAM OXALATE 20 MG PO TABS: ORAL_TABLET | ORAL | 3 refills | Status: DC

## 2017-10-18 MED ORDER — OXYCODONE-ACETAMINOPHEN 10-325 MG PO TABS
1.5000 | ORAL_TABLET | Freq: Two times a day (BID) | ORAL | 0 refills | Status: AC
Start: 2017-11-17 — End: 2017-12-17

## 2017-10-18 MED ORDER — VITAMIN D 50 MCG (2000 UT) PO CAPS: 2000.0000 [IU] | ORAL_CAPSULE | Freq: Every day | ORAL | 3 refills | Status: DC

## 2017-10-18 MED ORDER — OLOPATADINE HCL 0.1 % OP SOLN: 1.0000 [drp] | Freq: Two times a day (BID) | OPHTHALMIC | 99 refills | Status: AC

## 2017-10-18 NOTE — Patient Instructions (Addendum)
Go ahead and call [place] to schedule your next visit with me there. [phone].   IF you received an x-ray today, you will receive an invoice from [facility]. Please contact [facility] at [phone] with questions or concerns regarding your invoice.   IF you received labwork today, you will receive an invoice from [facility]. Please contact [facility] at [phone] with questions or concerns regarding your invoice.   Our billing staff will not be able to assist you with questions regarding bills from these companies.  You will be contacted with the lab results as soon as they are available. The fastest way to get your results is to activate your My Chart account. Instructions are located on the last page of this paperwork. If you have not heard from [place] regarding the results in 2 weeks, please contact this office.

## 2017-10-18 NOTE — Progress Notes (Signed)
Patient ID: [name], female    DOB: 03-25-1956, 61 y.o.   MRN: [id number]  PCP: [name], PA-C  Chief Complaint  Patient presents with  . Diabetes    follow up  . Medication Refill    ezetimibe and oxycodone   . Depression    screening was a 16     Subjective:   Presents for evaluation of diabetes, hyperlipidemia, depression/anxiety, fibromyalgia.  At her visit with me in February she was experiencing abdominal pain.  Given her history of diverticulitis she was treated, but symptoms persisted.  Stool studies were negative.  CT scan revealed no active diverticulitis.Thinks that the edarbi (Azilsartan) is the cause of diarrhea. Some mornings, "runs like water" 3-4 times each morning, others "it's normal."  Desires a handicap placard, due to severe limitation in ability to walk due to fibromyalgia, chronic right hip and right foot pain, right knee pain, right foot and ankle pain.   Review of Systems As above. No chest pain, shortness of breath, blurred vision, dizziness. No urinary symptoms.    Patient Active Problem List   Diagnosis Date Noted  . Acute coronary syndrome ([place]) 02/09/2017  . Right knee pain 07/04/2016  . Pain in right ankle and joints of right foot 07/04/2016  . Psoriasis 03/10/2015  . Right foot pain 11/13/2014  . Ethmoid sinusitis 11/13/2014  . Abdominal pain, epigastric 11/13/2014  . Vitamin D deficiency 11/13/2014  . 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 Sick sinus syndrome ([place]) 07/08/2014  . Chest pain at rest 07/05/2014  . Chronic rhinitis 10/22/2013  . Fatty liver 10/22/2013  . Interstitial cystitis 10/22/2013  . Hip pain, right 10/22/2013  . Chronic insomnia 09/25/2012  . HTN (hypertension) 09/25/2012  . Hyperlipidemia 09/25/2012  . Diabetes mellitus type 2, controlled, without complications ([place]) 64/33/2951  . GERD 12/04/2008  . History of colonic polyps 12/04/2008  . CONSTIPATION 10/21/2008  . Fibromyalgia 10/21/2008  .  LACTOSE INTOLERANCE 11/20/2007  . Chest pain, precordial 11/20/2007  . Anxiety and depression 08/02/2007  . Osteoporosis 08/02/2007  . Diverticulosis of large intestine 11/16/2006  . EXTERNAL HEMORRHOIDS 05/12/2005     Prior to Admission medications   Medication Sig Start Date End Date Taking? Authorizing Provider  Azilsartan Medoxomil (EDARBI) 40 MG TABS Take 40 mg by mouth daily.    Yes [provider]  beclomethasone (QVAR) 40 MCG/ACT inhaler Inhale 2 puffs into the lungs 2 (two) times daily as needed (for shortness of breath).    Yes [provider]  Blood Glucose Monitoring Suppl (ACCU-CHEK GUIDE) w/Device KIT USE DAILY AS DIRECTED 05/17/17  Yes Agron Swiney, PA-C  cetirizine (ZYRTEC) 10 MG tablet TAKE 1 TABLET (10 MG TOTAL) BY MOUTH DAILY. 07/18/17  Yes Shondrika Hoque, PA-C  ciprofloxacin (CIPRO) 500 MG tablet Take 500 mg by mouth 2 (two) times daily as needed (for diverticulitis).   Yes [provider]  [employer] D3 1000 units capsule TAKE 1 CAPSULE BY MOUTH DAILY 08/31/17  Yes Danzell Birky, PA-C  cyclobenzaprine (FLEXERIL) 10 MG tablet Take 10 mg by mouth 3 (three) times daily as needed for muscle spasms.    Yes [provider]  Desoximetasone (TOPICORT) 0.25 % ointment Apply 1 application topically 2 (two) times daily as needed (for psoriasis). 11/13/14  Yes Oseph Imburgia, PA-C  dexlansoprazole (DEXILANT) 60 MG capsule Take 60 mg by mouth daily as needed (for acid reflux).    Yes [provider]  diazepam (VALIUM) 5 MG tablet Take 1  tablet (5 mg total) by mouth daily as needed for anxiety or muscle spasms. 11/26/15  Yes Franshesca Chipman, PA-C  dicyclomine (BENTYL) 10 MG capsule TAKE 1 CAPSULE (10 MG TOTAL) BY MOUTH 4 (FOUR) TIMES DAILY - BEFORE MEALS AND AT BEDTIME. Patient taking differently: Take 10 mg by mouth 4 (four) times daily as needed for spasms.  12/31/16  Yes Richmond Coldren, PA-C  diphenoxylate-atropine (LOMOTIL) 2.5-0.025 MG tablet  Take 2 tablets by mouth 4 (four) times daily as needed for diarrhea or loose stools.    Yes [provider]  docusate sodium (COLACE) 100 MG capsule TAKE 1 CAPSULE (100 MG TOTAL) BY MOUTH DAILY AS NEEDED FOR MILD CONSTIPATION. 10/23/14  Yes Heith Haigler, PA-C  escitalopram (LEXAPRO) 20 MG tablet TAKE 1 TABLET EVERY DAY Patient taking differently: Take 20 mg by mouth daily 11/24/16  Yes Mariateresa Batra, PA-C  ezetimibe (ZETIA) 10 MG tablet Take 10 mg by mouth every evening.    Yes [provider]  fluticasone (FLONASE) 50 MCG/ACT nasal spray USE 2 SPRAYS INTO EACH NOSTRIL TWICE DAILY 07/25/17  Yes Markos Theil, PA-C  magic mouthwash w/lidocaine SOLN Take 10 mLs by mouth every 2 (two) hours as needed for mouth pain. 03/24/17  Yes Yexalen Deike, PA-C  meloxicam (MOBIC) 15 MG tablet TAKE 1 TABLET BY MOUTH EVERY DAY Patient taking differently: TAKE 15 MG BY MOUTH EVERY DAY. AS NEEDED 02/24/16  Yes Aryam Zhan, PA-C  metoprolol tartrate (LOPRESSOR) 25 MG tablet Take 0.5 tablets (12.5 mg total) by mouth 2 (two) times daily. 02/11/17  Yes Dixie Dials, MD  Multiple Vitamin (MULTIVITAMIN) tablet Take 1 tablet by mouth daily.   Yes [provider]  NITROSTAT 0.4 MG SL tablet Place 0.4 mg under the tongue every 5 (five) minutes as needed for chest pain. For chest pain. 11/24/11  Yes [provider]  olopatadine (PATANOL) 0.1 % ophthalmic solution Place 1 drop into both eyes 2 (two) times daily.   Yes [provider]  ondansetron (ZOFRAN ODT) 4 MG disintegrating tablet Take 1 tablet (4 mg total) by mouth every 8 (eight) hours as needed for nausea. 11/20/16  Yes Larene Pickett, PA-C  oxyCODONE-acetaminophen (PERCOCET) 10-325 MG tablet Take 1.5 tablets by mouth 2 (two) times daily. 09/15/17  Yes Susie Pousson, PA-C  PROAIR HFA 108 (90 Base) MCG/ACT inhaler INHALE 2 PUFFS 4 TIMES A DAY AS NEEDED Patient taking differently: INHALE 2 PUFFS 4 TIMES A DAY AS NEEDED  FOR SHORTNESS OF BREATH 09/30/16  Yes Arlita Buffkin, PA-C  Probiotic Product (PROBIOTIC DAILY) CAPS Take 1 capsule by mouth daily.    Yes [provider]  ranitidine (ZANTAC) 150 MG tablet Take 150 mg by mouth daily as needed for heartburn.    Yes [provider]  zolpidem (AMBIEN) 10 MG tablet Take 10 mg by mouth at bedtime as needed for sleep.  09/28/13  Yes [provider]  traZODone (DESYREL) 50 MG tablet Take 0.5-1 tablets (25-50 mg total) by mouth at bedtime. Patient not taking: Reported on 10/18/2017 07/18/17   [name], PA-C     Allergies  Allergen Reactions  . Codeine Other (See Comments)    HALLUCINATIONS   . Propoxyphene N-Acetaminophen Other (See Comments)    HALLUCINATIONS  . Sulfonamide Derivatives Hives, Itching and Swelling  . Alka-Seltzer [Aspirin Effervescent] Other (See Comments)    "like she's fading away", jittery   . Norvasc [Amlodipine Besylate] Other (See Comments)    "passed out"  . Red Dye  Other (See Comments)    "burning and tingling sensation"  . Singulair [Montelukast Sodium] Other (See Comments)    Increased depression, sadness  . Bupropion Anxiety and Other (See Comments)    headache  . Iohexol Nausea Only    JITTERY    . Latex Hives and Itching       Objective:  Physical Exam         Assessment & Plan:   Problem List Items Addressed This Visit    Anxiety and depression (Chronic)    Reasonably well controlled, certainly stable.  Continue Lexapro.      Relevant Medications   escitalopram (LEXAPRO) 20 MG tablet   Diabetes mellitus type 2, controlled, without complications (HCC) - Primary (Chronic)    Continue healthy lifestyle modifications.  Intolerant to statins.  Unclear why she is not taking an ACE inhibitor.  Possibly due to intolerance but it is not documented.  If hemoglobin A1c is above 7% would initiate metformin.      Relevant Orders   Comprehensive metabolic panel (Completed)   Hemoglobin  A1c (Completed)   HM DIABETES EYE EXAM (Completed)   HTN (hypertension) (Chronic)    Controlled.  Continue metoprolol tartrate.      Relevant Medications   ezetimibe (ZETIA) 10 MG tablet   Other Relevant Orders   Comprehensive metabolic panel (Completed)   CBC with Differential/Platelet (Completed)   Hyperlipidemia (Chronic)   Relevant Medications   ezetimibe (ZETIA) 10 MG tablet   Other Relevant Orders   Comprehensive metabolic panel (Completed)   Lipid panel (Completed)   Diverticulosis of large intestine    Encourage follow-up with GI for additional evaluation of her current symptoms.      Fibromyalgia   Relevant Medications   oxyCODONE-acetaminophen (PERCOCET) 10-325 MG tablet (Start on 12/17/2017)   Chronic rhinitis    Generally well controlled.  Continue current treatment.      Relevant Medications   fluticasone (FLONASE) 50 MCG/ACT nasal spray   azelastine (ASTELIN) 0.1 % nasal spray   Vitamin D deficiency   Relevant Medications   Cholecalciferol (VITAMIN D) 2000 units CAPS   Calcium-Magnesium 500-250 MG TABS   Aortic atherosclerosis (HCC)    I doubt Zetia is the cause of her diarrhea.  Encouraged her to continue it.  Continue management of hypertension and diabetes.  Follow-up with cardiology per recommendations.      Relevant Medications   ezetimibe (ZETIA) 10 MG tablet    Other Visit Diagnoses    Allergic eye reaction       Relevant Medications   olopatadine (PATANOL) 0.1 % ophthalmic solution       Return in about 3 months (around 01/18/2018) for re-evaluation of diabetes, cholesterol.   Fara Chute, PA-C [facility]

## 2017-10-18 NOTE — Progress Notes (Deleted)
Patient ID: [name], female    DOB: [birth year], 62 y.o.   MRN: [id number]  PCP: [name], PA-C  Chief Complaint  Patient presents with  . Diabetes    follow up  . Medication Refill    ezetimibe and oxycodone   . Depression    screening was a 16     Subjective:   Presents for evaluation of   .    Review of Systems     Patient Active Problem List   Diagnosis Date Noted  . Acute coronary syndrome ([place]) 02/09/2017  . Right knee pain 07/04/2016  . Pain in right ankle and joints of right foot 07/04/2016  . Psoriasis 03/10/2015  . Right foot pain 11/13/2014  . Ethmoid sinusitis 11/13/2014  . Abdominal pain, epigastric 11/13/2014  . Vitamin D deficiency 11/13/2014  . 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 Sick sinus syndrome ([place]) 07/08/2014  . Chest pain at rest 07/05/2014  . Chronic rhinitis 10/22/2013  . Fatty liver 10/22/2013  . Interstitial cystitis 10/22/2013  . Hip pain, right 10/22/2013  . Chronic insomnia 09/25/2012  . HTN (hypertension) 09/25/2012  . Hyperlipidemia 09/25/2012  . Diabetes mellitus type 2, controlled, without complications ([place]) 26/71/2458  . GERD 12/04/2008  . History of colonic polyps 12/04/2008  . CONSTIPATION 10/21/2008  . Fibromyalgia 10/21/2008  . LACTOSE INTOLERANCE 11/20/2007  . Chest pain, precordial 11/20/2007  . Anxiety and depression 08/02/2007  . Osteoporosis 08/02/2007  . Diverticulosis of large intestine 11/16/2006  . EXTERNAL HEMORRHOIDS 05/12/2005     Prior to Admission medications   Medication Sig Start Date End Date Taking? Authorizing Provider  Azilsartan Medoxomil (EDARBI) 40 MG TABS Take 40 mg by mouth daily.    Yes [provider]  beclomethasone (QVAR) 40 MCG/ACT inhaler Inhale 2 puffs into the lungs 2 (two) times daily as needed (for shortness of breath).    Yes [provider]  Blood Glucose Monitoring Suppl (ACCU-CHEK GUIDE) w/Device KIT USE DAILY AS DIRECTED 05/17/17  Yes Jeffery,  [name], PA-C  cetirizine (ZYRTEC) 10 MG tablet TAKE 1 TABLET (10 MG TOTAL) BY MOUTH DAILY. 07/18/17  Yes Jeffery, [name], PA-C  ciprofloxacin (CIPRO) 500 MG tablet Take 500 mg by mouth 2 (two) times daily as needed (for diverticulitis).   Yes [provider]  CVS D3 1000 units capsule TAKE 1 CAPSULE BY MOUTH DAILY 08/31/17  Yes Jeffery, [name], PA-C  cyclobenzaprine (FLEXERIL) 10 MG tablet Take 10 mg by mouth 3 (three) times daily as needed for muscle spasms.    Yes [provider]  Desoximetasone (TOPICORT) 0.25 % ointment Apply 1 application topically 2 (two) times daily as needed (for psoriasis). 11/13/14  Yes Jeffery, [name], PA-C  dexlansoprazole (DEXILANT) 60 MG capsule Take 60 mg by mouth daily as needed (for acid reflux).    Yes [provider]  diazepam (VALIUM) 5 MG tablet Take 1 tablet (5 mg total) by mouth daily as needed for anxiety or muscle spasms. 11/26/15  Yes Jeffery, [name], PA-C  dicyclomine (BENTYL) 10 MG capsule TAKE 1 CAPSULE (10 MG TOTAL) BY MOUTH 4 (FOUR) TIMES DAILY - BEFORE MEALS AND AT BEDTIME. Patient taking differently: Take 10 mg by mouth 4 (four) times daily as needed for spasms.  12/31/16  Yes Jeffery, [name], PA-C  diphenoxylate-atropine (LOMOTIL) 2.5-0.025 MG tablet Take 2 tablets by mouth 4 (four) times daily as needed for diarrhea or loose stools.    Yes [provider]  docusate sodium (COLACE) 100 MG  capsule TAKE 1 CAPSULE (100 MG TOTAL) BY MOUTH DAILY AS NEEDED FOR MILD CONSTIPATION. 10/23/14  Yes Jeffery, [name], PA-C  escitalopram (LEXAPRO) 20 MG tablet TAKE 1 TABLET EVERY DAY Patient taking differently: Take 20 mg by mouth daily 11/24/16  Yes Jeffery, [name], PA-C  ezetimibe (ZETIA) 10 MG tablet Take 10 mg by mouth every evening.    Yes [provider]  fluticasone (FLONASE) 50 MCG/ACT nasal spray USE 2 SPRAYS INTO EACH NOSTRIL TWICE DAILY 07/25/17  Yes Jeffery, [name], PA-C  magic mouthwash w/lidocaine SOLN Take 10 mLs by  mouth every 2 (two) hours as needed for mouth pain. 03/24/17  Yes Jeffery, [name], PA-C  meloxicam (MOBIC) 15 MG tablet TAKE 1 TABLET BY MOUTH EVERY DAY Patient taking differently: TAKE 15 MG BY MOUTH EVERY DAY. AS NEEDED 02/24/16  Yes Jeffery, [name], PA-C  metoprolol tartrate (LOPRESSOR) 25 MG tablet Take 0.5 tablets (12.5 mg total) by mouth 2 (two) times daily. 02/11/17  Yes Dixie Dials, MD  Multiple Vitamin (MULTIVITAMIN) tablet Take 1 tablet by mouth daily.   Yes [provider]  NITROSTAT 0.4 MG SL tablet Place 0.4 mg under the tongue every 5 (five) minutes as needed for chest pain. For chest pain. 11/24/11  Yes [provider]  olopatadine (PATANOL) 0.1 % ophthalmic solution Place 1 drop into both eyes 2 (two) times daily.   Yes [provider]  ondansetron (ZOFRAN ODT) 4 MG disintegrating tablet Take 1 tablet (4 mg total) by mouth every 8 (eight) hours as needed for nausea. 11/20/16  Yes Larene Pickett, PA-C  oxyCODONE-acetaminophen (PERCOCET) 10-325 MG tablet Take 1.5 tablets by mouth 2 (two) times daily. 09/15/17  Yes Jeffery, [name], PA-C  PROAIR HFA 108 (90 Base) MCG/ACT inhaler INHALE 2 PUFFS 4 TIMES A DAY AS NEEDED Patient taking differently: INHALE 2 PUFFS 4 TIMES A DAY AS NEEDED FOR SHORTNESS OF BREATH 09/30/16  Yes Jeffery, [name], PA-C  Probiotic Product (PROBIOTIC DAILY) CAPS Take 1 capsule by mouth daily.    Yes [provider]  ranitidine (ZANTAC) 150 MG tablet Take 150 mg by mouth daily as needed for heartburn.    Yes [provider]  zolpidem (AMBIEN) 10 MG tablet Take 10 mg by mouth at bedtime as needed for sleep.  09/28/13  Yes [provider]  traZODone (DESYREL) 50 MG tablet Take 0.5-1 tablets (25-50 mg total) by mouth at bedtime. Patient not taking: Reported on 10/18/2017 07/18/17   [name], PA-C     Allergies  Allergen Reactions  . Codeine Other (See Comments)    HALLUCINATIONS   . Propoxyphene N-Acetaminophen  Other (See Comments)    HALLUCINATIONS  . Sulfonamide Derivatives Hives, Itching and Swelling  . Alka-Seltzer [Aspirin Effervescent] Other (See Comments)    "like she's fading away", jittery   . Norvasc [Amlodipine Besylate] Other (See Comments)    "passed out"  . Red Dye Other (See Comments)    "burning and tingling sensation"  . Singulair [Montelukast Sodium] Other (See Comments)    Increased depression, sadness  . Bupropion Anxiety and Other (See Comments)    headache  . Iohexol Nausea Only    JITTERY    . Latex Hives and Itching       Objective:  Physical Exam         Assessment & Plan:   ***

## 2017-10-19 LAB — CBC WITH DIFFERENTIAL/PLATELET
Basophils Absolute: 0 10*3/uL (ref 0.0–0.2)
Basos: 1 %
EOS (ABSOLUTE): 0.1 10*3/uL (ref 0.0–0.4)
EOS: 1 %
HEMATOCRIT: 37.3 % (ref 34.0–46.6)
Hemoglobin: 12.5 g/dL (ref 11.1–15.9)
IMMATURE GRANULOCYTES: 0 %
Immature Grans (Abs): 0 10*3/uL (ref 0.0–0.1)
LYMPHS ABS: 2.3 10*3/uL (ref 0.7–3.1)
Lymphs: 40 %
MCH: 27.4 pg (ref 26.6–33.0)
MCHC: 33.5 g/dL (ref 31.5–35.7)
MCV: 82 fL (ref 79–97)
MONOS ABS: 0.5 10*3/uL (ref 0.1–0.9)
Monocytes: 9 %
NEUTROS PCT: 49 %
Neutrophils Absolute: 3 10*3/uL (ref 1.4–7.0)
Platelets: 296 10*3/uL (ref 150–379)
RBC: 4.57 x10E6/uL (ref 3.77–5.28)
RDW: 14.5 % (ref 12.3–15.4)
WBC: 5.9 10*3/uL (ref 3.4–10.8)

## 2017-10-19 LAB — COMPREHENSIVE METABOLIC PANEL
A/G RATIO: 1.6 (ref 1.2–2.2)
ALBUMIN: 4.6 g/dL (ref 3.6–4.8)
ALT: 54 IU/L — ABNORMAL HIGH (ref 0–32)
AST: 32 IU/L (ref 0–40)
Alkaline Phosphatase: 55 IU/L (ref 39–117)
BUN / CREAT RATIO: 11 — AB (ref 12–28)
BUN: 8 mg/dL (ref 8–27)
Bilirubin Total: 0.7 mg/dL (ref 0.0–1.2)
CALCIUM: 9.5 mg/dL (ref 8.7–10.3)
CO2: 25 mmol/L (ref 20–29)
CREATININE: 0.73 mg/dL (ref 0.57–1.00)
Chloride: 102 mmol/L (ref 96–106)
GFR calc Af Amer: 102 mL/min/{1.73_m2} (ref >59)
GFR, EST NON AFRICAN AMERICAN: 89 mL/min/{1.73_m2} (ref >59)
GLOBULIN, TOTAL: 2.8 g/dL (ref 1.5–4.5)
Glucose: 99 mg/dL (ref 65–99)
POTASSIUM: 4.4 mmol/L (ref 3.5–5.2)
SODIUM: 143 mmol/L (ref 134–144)
Total Protein: 7.4 g/dL (ref 6.0–8.5)

## 2017-10-19 LAB — HEMOGLOBIN A1C
Est. average glucose Bld gHb Est-mCnc: 126 mg/dL
HEMOGLOBIN A1C: 6 % — AB (ref 4.8–5.6)

## 2017-10-19 LAB — LIPID PANEL
CHOL/HDL RATIO: 4.5 ratio — AB (ref 0.0–4.4)
Cholesterol, Total: 165 mg/dL (ref 100–199)
HDL: 37 mg/dL — AB (ref >39)
LDL Calculated: 109 mg/dL — ABNORMAL HIGH (ref 0–99)
Triglycerides: 93 mg/dL (ref 0–149)
VLDL Cholesterol Cal: 19 mg/dL (ref 5–40)

## 2017-10-21 NOTE — Assessment & Plan Note (Addendum)
Generally well controlled.  Continue current treatment.

## 2017-10-21 NOTE — Assessment & Plan Note (Signed)
Reasonably well controlled, certainly stable.  Continue Lexapro.

## 2017-10-21 NOTE — Assessment & Plan Note (Signed)
I doubt Zetia is the cause of her diarrhea.  Encouraged her to continue it.  Continue management of hypertension and diabetes.  Follow-up with cardiology per recommendations.

## 2017-10-21 NOTE — Assessment & Plan Note (Signed)
Encourage follow-up with GI for additional evaluation of her current symptoms.

## 2017-10-21 NOTE — Assessment & Plan Note (Signed)
Controlled.  Continue metoprolol tartrate.

## 2017-10-21 NOTE — Assessment & Plan Note (Addendum)
Continue healthy lifestyle modifications.  Intolerant to statins.  Unclear why she is not taking an ACE inhibitor.  Possibly due to intolerance but it is not documented.  If hemoglobin A1c is above 7% would initiate metformin.

## 2017-10-25 ENCOUNTER — Ambulatory Visit (INDEPENDENT_AMBULATORY_CARE_PROVIDER_SITE_OTHER): Payer: Medicare Other | Admitting: Psychiatry

## 2017-10-25 DIAGNOSIS — F331 Major depressive disorder, recurrent, moderate: Secondary | ICD-10-CM | POA: Diagnosis not present

## 2017-11-07 ENCOUNTER — Encounter: Payer: Self-pay | Admitting: Physician Assistant

## 2017-11-12 NOTE — Progress Notes (Signed)
Electrophysiology Office Note Date: 11/13/2017  ID:  [name], DOB 26-Jul-1955, MRN [id number]  PCP: [name], PA-C Primary Cardiologist: Doylene Canard Electrophysiologist: Allred  CC: Pacemaker follow-up  [name] is a 62 y.o. female seen today for Dr Rayann Heman.  She presents today for routine electrophysiology followup.  Since last being seen in our clinic, the patient reports doing relatively well.  She has multiple somatic complaints including jaw pain since last Thursday but improved this morning, abdominal pain, diarrhea, back pain, rash on left foot, fatigue.   Device History: MDT MRI compatible dual chamber PPM implanted 2016 for SSS  Past Medical History:  Diagnosis Date  . Allergy   . Anal fissure 02/2012  . Anxiety   . Arthritis   . Asthma   . Bursitis    right hip  . Complication of anesthesia     woke up during a colonscopy, difficult to wake up post-op sometimes , requires lots of blankets to stay warm post op  . Depression   . Diabetes mellitus without complication ([place])    type 2 diet controlled  . Fatty liver    sees dr Collene Mares for  . Fibromyalgia   . Gallstones 02/15/2015  . GERD (gastroesophageal reflux disease)   . Hepatitis A infection 1979  . Hyperlipidemia   . Hypertension   . Irritable bowel syndrome (IBS)   . 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 Psoriasis    left foot  . 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 Tarsal tunnel syndrome of left side    Past Surgical History:  Procedure Laterality Date  . ABDOMINAL HYSTERECTOMY  09/1994   partial  . APPENDECTOMY    . BILATERAL SALPINGOOPHORECTOMY Bilateral 04/2011  . CARDIAC CATHETERIZATION  10/08/2007  . CESAREAN SECTION     x 1  . CHOLECYSTECTOMY N/A 02/16/2015   Procedure: LAPAROSCOPIC CHOLECYSTECTOMY WITH INTRAOPERATIVE CHOLANGIOGRAM;  Surgeon: Johnathan Hausen, MD;  Location: [facility];  Service: General;   Laterality: N/A;  . COLONOSCOPY WITH PROPOFOL N/A 08/19/2016   Procedure: COLONOSCOPY WITH PROPOFOL;  Surgeon: Carol Ada, MD;  Location: [facility];  Service: Endoscopy;  Laterality: N/A;  . CYSTOSCOPY W/ URETERAL STENT PLACEMENT  08/04/2009  . EXAMINATION UNDER ANESTHESIA  02/17/2012   Procedure: EXAM UNDER ANESTHESIA;  Surgeon: Pedro Earls, MD;  Location: [facility];  Service: General;  Laterality: N/A;  Exam under anesthesia with lateral internal sphincterotomy  . LAPAROSCOPIC LYSIS INTESTINAL ADHESIONS  08/04/2009  . LEFT HEART CATH AND CORONARY ANGIOGRAPHY N/A 02/10/2017   Procedure: LEFT HEART CATH AND CORONARY ANGIOGRAPHY;  Surgeon: Dixie Dials, MD;  Location: [facility];  Service: Cardiovascular;  Laterality: N/A;  . PERMANENT PACEMAKER INSERTION N/A 07/08/2014   MDT MRI compatible dual chamber pacemaker implanted by Dr Rayann Heman for symptomatic bradycardia  . SHOULDER ARTHROSCOPY W/ ROTATOR CUFF REPAIR Left 1999  . SMALL INTESTINE SURGERY    . SPHINCTEROTOMY  02/17/2012   Procedure: SPHINCTEROTOMY;  Surgeon: Pedro Earls, MD;  Location: [facility];  Service: General;  Laterality: N/A;    Current Outpatient Medications  Medication Sig Dispense Refill  . ALPRAZolam (XANAX) 0.5 MG tablet Take 1 tablet by mouth as needed.  0  . azelastine (ASTELIN) 0.1 % nasal spray Place 2 sprays into both nostrils 2 (two) times daily. Use in each nostril as directed 30 mL prn  .  Azilsartan Medoxomil (EDARBI) 40 MG TABS Take 40 mg by mouth daily.     . beclomethasone (QVAR) 40 MCG/ACT inhaler Inhale 2 puffs into the lungs 2 (two) times daily as needed (for shortness of breath).     . Blood Glucose Monitoring Suppl (ACCU-CHEK GUIDE) w/Device KIT USE DAILY AS DIRECTED 1 kit 0  . calcipotriene (DOVONOX) 0.005 % cream Apply 1 application topically daily.  6  . Calcium-Magnesium 500-250 MG TABS Take one tablet twice daily 100 each 3  . cetirizine (ZYRTEC) 10 MG  tablet TAKE 1 TABLET (10 MG TOTAL) BY MOUTH DAILY. 30 tablet 5  . Cholecalciferol (VITAMIN D) 2000 units CAPS Take 1 capsule (2,000 Units total) by mouth daily. 90 capsule 3  . ciprofloxacin (CIPRO) 500 MG tablet Take 500 mg by mouth 2 (two) times daily as needed (for diverticulitis).    . cyclobenzaprine (FLEXERIL) 10 MG tablet Take 10 mg by mouth 3 (three) times daily as needed for muscle spasms.     Marland Kitchen desoximetasone (TOPICORT) 0.25 % cream Apply 1 application topically daily.  6  . Desoximetasone (TOPICORT) 0.25 % ointment Apply 1 application topically 2 (two) times daily as needed (for psoriasis). 30 g 6  . dexlansoprazole (DEXILANT) 60 MG capsule Take 60 mg by mouth daily as needed (for acid reflux).     . diazepam (VALIUM) 5 MG tablet Take 1 tablet (5 mg total) by mouth daily as needed for anxiety or muscle spasms. 30 tablet 0  . dicyclomine (BENTYL) 10 MG capsule TAKE 1 CAPSULE (10 MG TOTAL) BY MOUTH 4 (FOUR) TIMES DAILY - BEFORE MEALS AND AT BEDTIME. (Patient taking differently: Take 10 mg by mouth 4 (four) times daily as needed for spasms. ) 30 capsule 1  . diphenoxylate-atropine (LOMOTIL) 2.5-0.025 MG tablet Take 2 tablets by mouth 4 (four) times daily as needed for diarrhea or loose stools.     . docusate sodium (COLACE) 100 MG capsule TAKE 1 CAPSULE (100 MG TOTAL) BY MOUTH DAILY AS NEEDED FOR MILD CONSTIPATION. 30 capsule 0  . escitalopram (LEXAPRO) 20 MG tablet Take 20 mg by mouth daily 90 tablet 3  . ezetimibe (ZETIA) 10 MG tablet Take 1 tablet (10 mg total) by mouth every evening. 90 tablet 3  . FLOVENT HFA 110 MCG/ACT inhaler Take 2 puffs by mouth as needed.  12  . fluticasone (FLONASE) 50 MCG/ACT nasal spray Place 2 sprays into both nostrils daily. 16 g 12  . LOTEMAX SM 0.38 % GEL Place 1 drop into both eyes as needed.  1  . magic mouthwash w/lidocaine SOLN Take 10 mLs by mouth every 2 (two) hours as needed for mouth pain. 360 mL prn  . meloxicam (MOBIC) 15 MG tablet TAKE 1 TABLET  BY MOUTH EVERY DAY (Patient taking differently: TAKE 15 MG BY MOUTH EVERY DAY. AS NEEDED) 30 tablet 3  . metoprolol tartrate (LOPRESSOR) 25 MG tablet Take 0.5 tablets (12.5 mg total) by mouth 2 (two) times daily. 30 tablet 3  . Multiple Vitamin (MULTIVITAMIN) tablet Take 1 tablet by mouth daily.    Marland Kitchen NITROSTAT 0.4 MG SL tablet Place 0.4 mg under the tongue every 5 (five) minutes as needed for chest pain. For chest pain.    Marland Kitchen olopatadine (PATANOL) 0.1 % ophthalmic solution Place 1 drop into both eyes 2 (two) times daily. 5 mL prn  . ondansetron (ZOFRAN ODT) 4 MG disintegrating tablet Take 1 tablet (4 mg total) by mouth every 8 (eight) hours as needed for  nausea. 10 tablet 0  . [START ON 12/17/2017] oxyCODONE-acetaminophen (PERCOCET) 10-325 MG tablet Take 1.5 tablets by mouth 2 (two) times daily. 90 tablet 0  . [START ON 11/17/2017] oxyCODONE-acetaminophen (PERCOCET) 10-325 MG tablet Take 1.5 tablets by mouth 2 (two) times daily. 90 tablet 0  . oxyCODONE-acetaminophen (PERCOCET) 10-325 MG tablet Take 1.5 tablets by mouth 2 (two) times daily. 90 tablet 0  . PROAIR HFA 108 (90 Base) MCG/ACT inhaler INHALE 2 PUFFS 4 TIMES A DAY AS NEEDED (Patient taking differently: INHALE 2 PUFFS 4 TIMES A DAY AS NEEDED FOR SHORTNESS OF BREATH) 8 Inhaler 0  . Probiotic Product (PROBIOTIC DAILY) CAPS Take 1 capsule by mouth daily.     . ranitidine (ZANTAC) 150 MG tablet Take 150 mg by mouth daily as needed for heartburn.     . traZODone (DESYREL) 50 MG tablet Take 0.5-1 tablets (25-50 mg total) by mouth at bedtime. 30 tablet 3  . zolpidem (AMBIEN) 10 MG tablet Take 10 mg by mouth at bedtime as needed for sleep.      No current facility-administered medications for this visit.     Allergies:   Codeine; Propoxyphene n-acetaminophen; Sulfonamide derivatives; Alka-seltzer [aspirin effervescent]; Norvasc [amlodipine besylate]; Red dye; Singulair [montelukast sodium]; Bupropion; Iohexol; and Latex   Social History: Social  History   Socioeconomic History  . Marital status: Divorced    Spouse name: n/a  . Number of children: 1  . Years of education: 55  . Highest education level: Not on file  Occupational History  . Occupation: Disabled    Employer: RETIRED    Comment: Fibromyalgia, Depression, Anxiety  Social Needs  . Financial resource strain: Not on file  . Food insecurity:    Worry: Not on file    Inability: Not on file  . Transportation needs:    Medical: Not on file    Non-medical: Not on file  Tobacco Use  . Smoking status: Former Smoker    Packs/day: 0.50    Years: 37.00    Pack years: 18.50    Types: Cigarettes    Last attempt to quit: 06/05/2012    Years since quitting: 5.4  . Smokeless tobacco: Never Used  Substance and Sexual Activity  . Alcohol use: No  . Drug use: No  . Sexual activity: Not Currently    Birth control/protection: Post-menopausal  Lifestyle  . Physical activity:    Days per week: Not on file    Minutes per session: Not on file  . Stress: Not on file  Relationships  . Social connections:    Talks on phone: Not on file    Gets together: Not on file    Attends religious service: Not on file    Active member of club or organization: Not on file    Attends meetings of clubs or organizations: Not on file    Relationship status: Not on file  . Intimate partner violence:    Fear of current or ex partner: Not on file    Emotionally abused: Not on file    Physically abused: Not on file    Forced sexual activity: Not on file  Other Topics Concern  . Not on file  Social History Narrative   Lives alone.  Daughter and granddaughter live in [place], [place]. Pt exercise sometimes.       Family History: Family History  Problem Relation Age of Onset  . Cancer Mother        liver  . Diabetes Mother   .  Hyperlipidemia Mother   . Hypertension Mother   . Stroke Mother   . Cancer Maternal Grandmother        breast  . Anesthesia problems Sister        hard to  wake up post-op  . Heart disease Father   . Hyperlipidemia Brother   . HIV/AIDS Brother   . Cancer Maternal Grandfather        colon  . Diabetes Brother   . Cancer Brother        lung  . Cancer Paternal Grandfather        COLON     Review of Systems: All other systems reviewed and are otherwise negative except as noted above.   Physical Exam: VS:  BP 122/80   Pulse 81   Ht 5' 7.32" (1.71 m)   Wt 171 lb (77.6 kg)   SpO2 98%   BMI 26.53 kg/m  , BMI Body mass index is 26.53 kg/m.  GEN- The patient is well appearing, alert and oriented x 3 today.   HEENT: normocephalic, atraumatic; sclera clear, conjunctiva pink; hearing intact; oropharynx clear; neck supple  Lungs- Clear to ausculation bilaterally, normal work of breathing.  No wheezes, rales, rhonchi Heart- Regular rate and rhythm  GI- soft, non-tender, non-distended, bowel sounds present  Extremities- no clubbing, cyanosis, or edema  MS- no significant deformity or atrophy Skin- warm and dry, no rash or lesion; PPM pocket well healed Psych- euthymic mood, full affect Neuro- strength and sensation are intact  PPM Interrogation- reviewed in detail today,  See PACEART report  EKG:  EKG is not ordered today.  Recent Labs: 07/18/2017: TSH 1.120 10/18/2017: ALT 54; BUN 8; Creatinine, Ser 0.73; Hemoglobin 12.5; Platelets 296; Potassium 4.4; Sodium 143   Wt Readings from Last 3 Encounters:  11/13/17 171 lb (77.6 kg)  10/18/17 168 lb 6.4 oz (76.4 kg)  07/27/17 169 lb (76.7 kg)     Other studies Reviewed: Additional studies/ records that were reviewed today include: Dr Jackalyn Lombard office notes   Assessment and Plan:  1.  Sick sinus syndrome Normal PPM function See Pace Art report No changes today  2.  HTN Stable No change required today   Current medicines are reviewed at length with the patient today.   The patient does not have concerns regarding her medicines.  The following changes were made today:   none  Labs/ tests ordered today include: none Orders Placed This Encounter  Procedures  . CUP PACEART INCLINIC DEVICE CHECK     Disposition:   Follow up with [facility], me in 1 year      Signed, Chanetta Marshall, NP 11/13/2017 10:49 AM  [place]  [phone] (office) [phone] (fax)

## 2017-11-13 ENCOUNTER — Telehealth: Payer: Self-pay

## 2017-11-13 ENCOUNTER — Ambulatory Visit (INDEPENDENT_AMBULATORY_CARE_PROVIDER_SITE_OTHER): Payer: Medicare Other | Admitting: Nurse Practitioner

## 2017-11-13 ENCOUNTER — Encounter: Payer: Self-pay | Admitting: Nurse Practitioner

## 2017-11-13 ENCOUNTER — Ambulatory Visit (INDEPENDENT_AMBULATORY_CARE_PROVIDER_SITE_OTHER): Payer: Medicare Other | Admitting: *Deleted

## 2017-11-13 VITALS — BP 122/80 | HR 81 | Ht 67.32 in | Wt 171.0 lb

## 2017-11-13 DIAGNOSIS — I10 Essential (primary) hypertension: Secondary | ICD-10-CM

## 2017-11-13 DIAGNOSIS — I495 Sick sinus syndrome: Secondary | ICD-10-CM | POA: Diagnosis not present

## 2017-11-13 LAB — CUP PACEART INCLINIC DEVICE CHECK
Date Time Interrogation Session: 20190610102820
Implantable Lead Location: 753859
Implantable Lead Model: 5076
Implantable Lead Model: 5076
MDC IDC LEAD IMPLANT DT: 20160202
MDC IDC LEAD IMPLANT DT: 20160202
MDC IDC LEAD LOCATION: 753860
MDC IDC PG IMPLANT DT: 20160202

## 2017-11-13 NOTE — Progress Notes (Signed)
Remote pacemaker transmission.   

## 2017-11-13 NOTE — Telephone Encounter (Signed)
I spoke with the patient about this.

## 2017-11-13 NOTE — Patient Instructions (Addendum)
Medication Instructions:   Your physician recommends that you continue on your current medications as directed. Please refer to the Current Medication list given to you today.   If you need a refill on your cardiac medications before your next appointment, please call your pharmacy.  Labwork: NONE ORDERED  TODAY    Testing/Procedures: NONE ORDERED  TODAY    Follow-Up:  Your physician wants you to follow-up in: [place] will receive a reminder letter in the mail two months in advance. If you don't receive a letter, please call our office to schedule the follow-up appointment.    Remote monitoring is used to monitor your Pacemaker of ICD from home. This monitoring reduces the number of office visits required to check your device to one time per year. It allows [place] to keep an eye on the functioning of your device to ensure it is working properly. You are scheduled for a device check from home on .  02-12-18 You may send your transmission at any time that day. If you have a wireless device, the transmission will be sent automatically. After your physician reviews your transmission, you will receive a postcard with your next transmission date.     Any Other Special Instructions Will Be Listed Below (If Applicable).

## 2017-11-14 ENCOUNTER — Encounter: Payer: Self-pay | Admitting: Cardiology

## 2017-11-14 LAB — CUP PACEART REMOTE DEVICE CHECK
Battery Remaining Longevity: 81 mo
Battery Voltage: 3.01 V
Brady Statistic AP VP Percent: 0.09 %
Brady Statistic AS VP Percent: 0 %
Brady Statistic RA Percent Paced: 99.72 %
Date Time Interrogation Session: 20190610183731
Implantable Lead Implant Date: 20160202
Implantable Lead Location: 753859
Implantable Lead Location: 753860
Implantable Lead Model: 5076
Implantable Lead Model: 5076
Implantable Pulse Generator Implant Date: 20160202
Lead Channel Impedance Value: 399 Ohm
Lead Channel Pacing Threshold Amplitude: 0.5 V
Lead Channel Pacing Threshold Pulse Width: 0.4 ms
Lead Channel Sensing Intrinsic Amplitude: 25.75 mV
Lead Channel Setting Pacing Amplitude: 2 V
Lead Channel Setting Pacing Pulse Width: 0.4 ms
Lead Channel Setting Sensing Sensitivity: 2.8 mV
MDC IDC LEAD IMPLANT DT: 20160202
MDC IDC MSMT LEADCHNL RA IMPEDANCE VALUE: 418 Ohm
MDC IDC MSMT LEADCHNL RA IMPEDANCE VALUE: 456 Ohm
MDC IDC MSMT LEADCHNL RA SENSING INTR AMPL: 3.5 mV
MDC IDC MSMT LEADCHNL RA SENSING INTR AMPL: 3.5 mV
MDC IDC MSMT LEADCHNL RV IMPEDANCE VALUE: 513 Ohm
MDC IDC MSMT LEADCHNL RV PACING THRESHOLD AMPLITUDE: 0.875 V
MDC IDC MSMT LEADCHNL RV PACING THRESHOLD PULSEWIDTH: 0.4 ms
MDC IDC MSMT LEADCHNL RV SENSING INTR AMPL: 18.625 mV
MDC IDC SET LEADCHNL RV PACING AMPLITUDE: 2.5 V
MDC IDC STAT BRADY AP VS PERCENT: 99.68 %
MDC IDC STAT BRADY AS VS PERCENT: 0.23 %
MDC IDC STAT BRADY RV PERCENT PACED: 0.1 %

## 2017-11-17 ENCOUNTER — Encounter: Payer: Self-pay | Admitting: Nurse Practitioner

## 2017-11-21 ENCOUNTER — Other Ambulatory Visit: Payer: Self-pay | Admitting: Physician Assistant

## 2017-11-21 DIAGNOSIS — J302 Other seasonal allergic rhinitis: Secondary | ICD-10-CM

## 2017-11-22 ENCOUNTER — Ambulatory Visit (INDEPENDENT_AMBULATORY_CARE_PROVIDER_SITE_OTHER): Payer: Medicare Other | Admitting: Psychiatry

## 2017-11-22 DIAGNOSIS — F331 Major depressive disorder, recurrent, moderate: Secondary | ICD-10-CM | POA: Diagnosis not present

## 2017-12-27 ENCOUNTER — Ambulatory Visit (INDEPENDENT_AMBULATORY_CARE_PROVIDER_SITE_OTHER): Payer: Medicare Other | Admitting: Psychiatry

## 2017-12-27 DIAGNOSIS — F331 Major depressive disorder, recurrent, moderate: Secondary | ICD-10-CM

## 2018-01-22 NOTE — Progress Notes (Signed)
Office Visit Note  Patient: [name]             Date of Birth: 31-Aug-1955           MRN: [id number]             PCP: [name], PA-C Referring: [name], PA-C Visit Date: 01/25/2018 Occupation: @GUAROCC @  Subjective:  Neck pain.   History of Present Illness: [name] is a 62 y.o. female history of fibromyalgia osteoarthritis and disc disease.  She states she has been having discomfort in her left trapezius area.  She states she took some Mobic which was helpful.  None of the other joints are painful.  She states the fibromyalgia pain moves around all over the body.  Rates the pain from fibromyalgia could be 9 and 10 some days on the scale of 0-10.  Activities of Daily Living:  Patient reports morning stiffness for 2 minutes.   Patient Denies nocturnal pain.  Difficulty dressing/grooming: Denies Difficulty climbing stairs: Reports Difficulty getting out of chair: Denies Difficulty using hands for taps, buttons, cutlery, and/or writing: Denies  Review of Systems  Constitutional: Positive for fatigue. Negative for night sweats, weight gain and weight loss.  HENT: Negative for mouth sores, trouble swallowing, trouble swallowing, mouth dryness and nose dryness.   Eyes: Positive for dryness. Negative for pain, redness and visual disturbance.  Respiratory: Negative for cough, shortness of breath and difficulty breathing.   Cardiovascular: Negative for chest pain, palpitations, hypertension, irregular heartbeat and swelling in legs/feet.       Pacemaker  Gastrointestinal: Positive for diarrhea. Negative for blood in stool and constipation.        diverticulitis  Endocrine: Negative for increased urination.  Genitourinary: Negative for vaginal dryness.  Musculoskeletal: Positive for arthralgias, joint pain and morning stiffness. Negative for joint swelling, myalgias, muscle weakness, muscle tenderness and myalgias.  Skin: Negative for color change, rash, hair loss, skin  tightness, ulcers and sensitivity to sunlight.  Allergic/Immunologic: Negative for susceptible to infections.  Neurological: Negative for dizziness, memory loss, night sweats and weakness.  Hematological: Negative for swollen glands.  Psychiatric/Behavioral: Positive for depressed mood and sleep disturbance. The patient is nervous/anxious.     PMFS History:  Patient Active Problem List   Diagnosis Date Noted  . Aortic atherosclerosis ([place]) 10/18/2017  . Acute coronary syndrome ([place]) 02/09/2017  . Right knee pain 07/04/2016  . Pain in right ankle and joints of right foot 07/04/2016  . Psoriasis 03/10/2015  . Right foot pain 11/13/2014  . Ethmoid sinusitis 11/13/2014  . Abdominal pain, epigastric 11/13/2014  . Vitamin D deficiency 11/13/2014  . 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 Sick sinus syndrome ([place]) 07/08/2014  . Chest pain at rest 07/05/2014  . Chronic rhinitis 10/22/2013  . Fatty liver 10/22/2013  . Interstitial cystitis 10/22/2013  . Hip pain, right 10/22/2013  . Chronic insomnia 09/25/2012  . HTN (hypertension) 09/25/2012  . Hyperlipidemia 09/25/2012  . Diabetes mellitus type 2, controlled, without complications ([place]) 68/34/1962  . GERD 12/04/2008  . History of colonic polyps 12/04/2008  . CONSTIPATION 10/21/2008  . Fibromyalgia 10/21/2008  . LACTOSE INTOLERANCE 11/20/2007  . Chest pain, precordial 11/20/2007  . Anxiety and depression 08/02/2007  . Osteoporosis 08/02/2007  . Diverticulosis of large intestine 11/16/2006  . EXTERNAL HEMORRHOIDS 05/12/2005    Past Medical History:  Diagnosis Date  . Allergy   . Anal fissure 02/2012  . Anxiety   . Arthritis   . Asthma   . Bursitis  right hip  . Complication of anesthesia     woke up during a colonscopy, difficult to wake up post-op sometimes , requires lots of blankets to stay warm post op  . Depression   . Diabetes mellitus without complication ([place])    type 2 diet controlled  . Fatty liver    sees dr Collene Mares for   . Fibromyalgia   . Gallstones 02/15/2015  . GERD (gastroesophageal reflux disease)   . Hepatitis A infection 1979  . Hyperlipidemia   . Hypertension   . Irritable bowel syndrome (IBS)   . 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 Psoriasis    left foot  . 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 Tarsal tunnel syndrome of left side     Family History  Problem Relation Age of Onset  . Cancer Mother        liver  . Diabetes Mother   . Hyperlipidemia Mother   . Hypertension Mother   . Stroke Mother   . Cancer Maternal Grandmother        breast  . Anesthesia problems Sister        hard to wake up post-op  . Heart disease Father   . Hyperlipidemia Brother   . HIV/AIDS Brother   . Cancer Maternal Grandfather        colon  . Diabetes Brother   . Cancer Brother        lung  . Cancer Paternal Grandfather        COLON   Past Surgical History:  Procedure Laterality Date  . ABDOMINAL HYSTERECTOMY  09/1994   partial  . APPENDECTOMY    . BILATERAL SALPINGOOPHORECTOMY Bilateral 04/2011  . CARDIAC CATHETERIZATION  10/08/2007  . CESAREAN SECTION     x 1  . CHOLECYSTECTOMY N/A 02/16/2015   Procedure: LAPAROSCOPIC CHOLECYSTECTOMY WITH INTRAOPERATIVE CHOLANGIOGRAM;  Surgeon: Johnathan Hausen, MD;  Location: [facility];  Service: General;  Laterality: N/A;  . COLONOSCOPY WITH PROPOFOL N/A 08/19/2016   Procedure: COLONOSCOPY WITH PROPOFOL;  Surgeon: Carol Ada, MD;  Location: [facility];  Service: Endoscopy;  Laterality: N/A;  . CYSTOSCOPY W/ URETERAL STENT PLACEMENT  08/04/2009  . EXAMINATION UNDER ANESTHESIA  02/17/2012   Procedure: EXAM UNDER ANESTHESIA;  Surgeon: Pedro Earls, MD;  Location: [facility];  Service: General;  Laterality: N/A;  Exam under anesthesia with lateral internal sphincterotomy  . LAPAROSCOPIC LYSIS INTESTINAL ADHESIONS  08/04/2009  . LEFT HEART CATH AND CORONARY  ANGIOGRAPHY N/A 02/10/2017   Procedure: LEFT HEART CATH AND CORONARY ANGIOGRAPHY;  Surgeon: Dixie Dials, MD;  Location: [facility];  Service: Cardiovascular;  Laterality: N/A;  . PERMANENT PACEMAKER INSERTION N/A 07/08/2014   MDT MRI compatible dual chamber pacemaker implanted by Dr Rayann Heman for symptomatic bradycardia  . SHOULDER ARTHROSCOPY W/ ROTATOR CUFF REPAIR Left 1999  . SMALL INTESTINE SURGERY    . SPHINCTEROTOMY  02/17/2012   Procedure: SPHINCTEROTOMY;  Surgeon: Pedro Earls, MD;  Location: [facility];  Service: General;  Laterality: N/A;   Social History   Social History Narrative   Lives alone.  Daughter and granddaughter live in [place], [place]. Pt exercise sometimes.       Objective: Vital Signs: BP 132/86 (BP Location: Left Arm, Patient Position: Sitting, Cuff Size: Normal)   Pulse 62   Resp 14   Ht 5' 7"  (1.702 m)  Wt 169 lb (76.7 kg)   BMI 26.47 kg/m    Physical Exam  Constitutional: She is oriented to person, place, and time. She appears well-developed and well-nourished.  HENT:  Head: Normocephalic and atraumatic.  Eyes: Conjunctivae and EOM are normal.  Neck: Normal range of motion.  Cardiovascular: Normal rate, regular rhythm, normal heart sounds and intact distal pulses.  Pacemaker  Pulmonary/Chest: Effort normal and breath sounds normal.  Abdominal: Soft. Bowel sounds are normal.  Lymphadenopathy:    She has no cervical adenopathy.  Neurological: She is alert and oriented to person, place, and time.  Skin: Skin is warm and dry. Capillary refill takes less than 2 seconds.  Psychiatric: She has a normal mood and affect. Her behavior is normal.  Nursing note and vitals reviewed.    Musculoskeletal Exam: C-spine thoracic lumbar spine limited range of motion with discomfort.  She had bilateral trapezius spasm more so on the left side.  Shoulder joints elbow joints wrist joint MCPs PIPs DIPs were in good range of motion with no  synovitis.  Hip joints knee joints ankles MTPs PIPs DIPs were in good range of motion with no synovitis.  Generalized hyperalgesia and positive tender points.  CDAI Exam: CDAI Score: Not documented Patient Global Assessment: Not documented; Provider Global Assessment: Not documented Swollen: Not documented; Tender: Not documented Joint Exam   Not documented   There is currently no information documented on the homunculus. Go to the Rheumatology activity and complete the homunculus joint exam.  Investigation: No additional findings.  Imaging: No results found.  Recent Labs: Lab Results  Component Value Date   WBC 5.9 10/18/2017   HGB 12.5 10/18/2017   PLT 296 10/18/2017   NA 143 10/18/2017   K 4.4 10/18/2017   CL 102 10/18/2017   CO2 25 10/18/2017   GLUCOSE 99 10/18/2017   BUN 8 10/18/2017   CREATININE 0.73 10/18/2017   BILITOT 0.7 10/18/2017   ALKPHOS 55 10/18/2017   AST 32 10/18/2017   ALT 54 (H) 10/18/2017   PROT 7.4 10/18/2017   ALBUMIN 4.6 10/18/2017   CALCIUM 9.5 10/18/2017   GFRAA 102 10/18/2017    Speciality Comments: No specialty comments available.  Procedures:  Trigger Point Inj Date/Time: 01/25/2018 11:27 AM Performed by: Bo Merino, MD Authorized by: Bo Merino, MD   Consent Given by:  Patient Site marked: the procedure site was marked   Timeout: prior to procedure the correct patient, procedure, and site was verified   Indications:  Muscle spasm and pain Total # of Trigger Points:  1 Location: neck   Needle Size:  27 G Approach:  Dorsal Medications #1:  0.5 mL lidocaine 1 %; 10 mg triamcinolone acetonide 40 MG/ML Patient tolerance:  Patient tolerated the procedure well with no immediate complications   Allergies: Codeine; Propoxyphene n-acetaminophen; Sulfonamide derivatives; Alka-seltzer [aspirin effervescent]; Norvasc [amlodipine besylate]; Red dye; Singulair [montelukast sodium]; Bupropion; Iohexol; and Latex   Assessment /  Plan:     Visit Diagnoses: Fibromyalgia-she continues to have some generalized pain positive tender points.  Need for regular exercise was discussed.  Chronic insomnia-good sleep hygiene was discussed.  Neck pain-she had bilateral trapezius spasm.  Per her request left trapezius area was injected with cortisone as described above.  She tolerated the procedure well.  A handout on neck exercises was given.  DDD (degenerative disc disease), lumbar-she has some lower back stiffness.  Psoriasis-she has psoriasis on the plantars surface of her feet.  She states she has been  followed by dermatologist and using topical agents.  Osteopenia, unspecified location - 02/07/80-W score -2.1, Calicum and vitamin D supplements, repeat DEXA in 2 years   Vitamin D deficiency-he is on vitamin D supplement.  Other medical problems are listed as follows:  Diverticulosis of large intestine without hemorrhage  Essential hypertension  History of hypercholesterolemia  Sick sinus syndrome ([facility]) - Pacemaker  History of IBS  History of asthma  Anxiety and depression  History of gastroesophageal reflux (GERD)   Orders: Orders Placed This Encounter  Procedures  . Trigger Point Inj   No orders of the defined types were placed in this encounter.   Face-to-face time spent with patient was 30 minutes. Greater than 50% of time was spent in counseling and coordination of care.  Follow-Up Instructions: Return in about 6 months (around 07/28/2018) for FMS DDD.   Bo Merino, MD  Note - This record has been created using Editor, commissioning.  Chart creation errors have been sought, but may not always  have been located. Such creation errors do not reflect on  the standard of medical care.

## 2018-01-24 ENCOUNTER — Ambulatory Visit: Payer: Medicare Other | Admitting: Psychiatry

## 2018-01-25 ENCOUNTER — Encounter: Payer: Self-pay | Admitting: Rheumatology

## 2018-01-25 ENCOUNTER — Ambulatory Visit (INDEPENDENT_AMBULATORY_CARE_PROVIDER_SITE_OTHER): Payer: Medicare Other | Admitting: Rheumatology

## 2018-01-25 VITALS — BP 132/86 | HR 62 | Resp 14 | Ht 67.0 in | Wt 169.0 lb

## 2018-01-25 DIAGNOSIS — Z8719 Personal history of other diseases of the digestive system: Secondary | ICD-10-CM

## 2018-01-25 DIAGNOSIS — F419 Anxiety disorder, unspecified: Secondary | ICD-10-CM

## 2018-01-25 DIAGNOSIS — Z8709 Personal history of other diseases of the respiratory system: Secondary | ICD-10-CM

## 2018-01-25 DIAGNOSIS — M5136 Other intervertebral disc degeneration, lumbar region: Secondary | ICD-10-CM

## 2018-01-25 DIAGNOSIS — F32A Depression, unspecified: Secondary | ICD-10-CM

## 2018-01-25 DIAGNOSIS — F329 Major depressive disorder, single episode, unspecified: Secondary | ICD-10-CM

## 2018-01-25 DIAGNOSIS — M542 Cervicalgia: Secondary | ICD-10-CM

## 2018-01-25 DIAGNOSIS — M797 Fibromyalgia: Secondary | ICD-10-CM | POA: Diagnosis not present

## 2018-01-25 DIAGNOSIS — I10 Essential (primary) hypertension: Secondary | ICD-10-CM

## 2018-01-25 DIAGNOSIS — L409 Psoriasis, unspecified: Secondary | ICD-10-CM

## 2018-01-25 DIAGNOSIS — E559 Vitamin D deficiency, unspecified: Secondary | ICD-10-CM

## 2018-01-25 DIAGNOSIS — F5104 Psychophysiologic insomnia: Secondary | ICD-10-CM

## 2018-01-25 DIAGNOSIS — M51369 Other intervertebral disc degeneration, lumbar region without mention of lumbar back pain or lower extremity pain: Secondary | ICD-10-CM

## 2018-01-25 DIAGNOSIS — K5730 Diverticulosis of large intestine without perforation or abscess without bleeding: Secondary | ICD-10-CM

## 2018-01-25 DIAGNOSIS — I495 Sick sinus syndrome: Secondary | ICD-10-CM

## 2018-01-25 DIAGNOSIS — M8580 Other specified disorders of bone density and structure, unspecified site: Secondary | ICD-10-CM

## 2018-01-25 DIAGNOSIS — Z8639 Personal history of other endocrine, nutritional and metabolic disease: Secondary | ICD-10-CM

## 2018-01-25 MED ORDER — TRIAMCINOLONE ACETONIDE 40 MG/ML IJ SUSP
10.0000 mg | INTRAMUSCULAR | Status: AC | PRN
  Administered 2018-01-25: 10 mg via INTRAMUSCULAR

## 2018-01-25 MED ORDER — LIDOCAINE HCL 1 % IJ SOLN
0.5000 mL | INTRAMUSCULAR | Status: AC | PRN
  Administered 2018-01-25: .5 mL

## 2018-01-25 NOTE — Patient Instructions (Signed)
Cervical Strain and Sprain Rehab Ask your health care provider which exercises are safe for you. Do exercises exactly as told by your health care provider and adjust them as directed. It is normal to feel mild stretching, pulling, tightness, or discomfort as you do these exercises, but you should stop right away if you feel sudden pain or your pain gets worse.Do not begin these exercises until told by your health care provider. Stretching and range of motion exercises These exercises warm up your muscles and joints and improve the movement and flexibility of your neck. These exercises also help to relieve pain, numbness, and tingling. Exercise A: Cervical side bend  1. Using good posture, sit on a stable chair or stand up. 2. Without moving your shoulders, slowly tilt your left / right ear to your shoulder until you feel a stretch in your neck muscles. You should be looking straight ahead. 3. Hold for __________ seconds. 4. Repeat with the other side of your neck. Repeat __________ times. Complete this exercise __________ times a day. Exercise B: Cervical rotation  1. Using good posture, sit on a stable chair or stand up. 2. Slowly turn your head to the side as if you are looking over your left / right shoulder. ? Keep your eyes level with the ground. ? Stop when you feel a stretch along the side and the back of your neck. 3. Hold for __________ seconds. 4. Repeat this by turning to your other side. Repeat __________ times. Complete this exercise __________ times a day. Exercise C: Thoracic extension and pectoral stretch 1. Roll a towel or a small blanket so it is about 4 inches (10 cm) in diameter. 2. Lie down on your back on a firm surface. 3. Put the towel lengthwise, under your spine in the middle of your back. It should not be not under your shoulder blades. The towel should line up with your spine from your middle back to your lower back. 4. Put your hands behind your head and let your  elbows fall out to your sides. 5. Hold for __________ seconds. Repeat __________ times. Complete this exercise __________ times a day. Strengthening exercises These exercises build strength and endurance in your neck. Endurance is the ability to use your muscles for a long time, even after your muscles get tired. Exercise D: Upper cervical flexion, isometric 1. Lie on your back with a thin pillow behind your head and a small rolled-up towel under your neck. 2. Gently tuck your chin toward your chest and nod your head down to look toward your feet. Do not lift your head off the pillow. 3. Hold for __________ seconds. 4. Release the tension slowly. Relax your neck muscles completely before you repeat this exercise. Repeat __________ times. Complete this exercise __________ times a day. Exercise E: Cervical extension, isometric  1. Stand about 6 inches (15 cm) away from a wall, with your back facing the wall. 2. Place a soft object, about 6-8 inches (15-20 cm) in diameter, between the back of your head and the wall. A soft object could be a small pillow, a ball, or a folded towel. 3. Gently tilt your head back and press into the soft object. Keep your jaw and forehead relaxed. 4. Hold for __________ seconds. 5. Release the tension slowly. Relax your neck muscles completely before you repeat this exercise. Repeat __________ times. Complete this exercise __________ times a day. Posture and body mechanics  Body mechanics refers to the movements and positions of  your body while you do your daily activities. Posture is part of body mechanics. Good posture and healthy body mechanics can help to relieve stress in your body's tissues and joints. Good posture means that your spine is in its natural S-curve position (your spine is neutral), your shoulders are pulled back slightly, and your head is not tipped forward. The following are general guidelines for applying improved posture and body mechanics to  your everyday activities. Standing  When standing, keep your spine neutral and keep your feet about hip-width apart. Keep a slight bend in your knees. Your ears, shoulders, and hips should line up.  When you do a task in which you stand in one place for a long time, place one foot up on a stable object that is 2-4 inches (5-10 cm) high, such as a footstool. This helps keep your spine neutral. Sitting   When sitting, keep your spine neutral and your keep feet flat on the floor. Use a footrest, if necessary, and keep your thighs parallel to the floor. Avoid rounding your shoulders, and avoid tilting your head forward.  When working at a desk or a computer, keep your desk at a height where your hands are slightly lower than your elbows. Slide your chair under your desk so you are close enough to maintain good posture.  When working at a computer, place your monitor at a height where you are looking straight ahead and you do not have to tilt your head forward or downward to look at the screen. Resting When lying down and resting, avoid positions that are most painful for you. Try to support your neck in a neutral position. You can use a contour pillow or a small rolled-up towel. Your pillow should support your neck but not push on it. This information is not intended to replace advice given to you by your health care provider. Make sure you discuss any questions you have with your health care provider. Document Released: 05/23/2005 Document Revised: 01/28/2016 Document Reviewed: 04/29/2015 Elsevier Interactive Patient Education  [employer].

## 2018-02-12 ENCOUNTER — Ambulatory Visit (INDEPENDENT_AMBULATORY_CARE_PROVIDER_SITE_OTHER): Payer: Medicare Other | Admitting: *Deleted

## 2018-02-12 ENCOUNTER — Telehealth: Payer: Self-pay

## 2018-02-12 DIAGNOSIS — I495 Sick sinus syndrome: Secondary | ICD-10-CM | POA: Diagnosis not present

## 2018-02-12 NOTE — Telephone Encounter (Signed)
Spoke with pt and reminded pt of remote transmission that is due today. Pt verbalized understanding.   

## 2018-02-13 ENCOUNTER — Encounter: Payer: Self-pay | Admitting: Cardiology

## 2018-02-13 NOTE — Progress Notes (Signed)
Remote pacemaker transmission.   

## 2018-02-20 ENCOUNTER — Ambulatory Visit (INDEPENDENT_AMBULATORY_CARE_PROVIDER_SITE_OTHER): Payer: Medicare Other | Admitting: Psychiatry

## 2018-02-20 DIAGNOSIS — F331 Major depressive disorder, recurrent, moderate: Secondary | ICD-10-CM

## 2018-03-03 LAB — CUP PACEART REMOTE DEVICE CHECK
Battery Voltage: 3.01 V
Brady Statistic AP VP Percent: 0.07 %
Brady Statistic AS VP Percent: 0 %
Brady Statistic AS VS Percent: 0.07 %
Implantable Lead Implant Date: 20160202
Implantable Lead Implant Date: 20160202
Implantable Lead Model: 5076
Lead Channel Impedance Value: 418 Ohm
Lead Channel Impedance Value: 418 Ohm
Lead Channel Impedance Value: 456 Ohm
Lead Channel Pacing Threshold Amplitude: 0.5 V
Lead Channel Pacing Threshold Amplitude: 1 V
Lead Channel Pacing Threshold Pulse Width: 0.4 ms
Lead Channel Sensing Intrinsic Amplitude: 23.75 mV
Lead Channel Sensing Intrinsic Amplitude: 4.25 mV
Lead Channel Setting Pacing Amplitude: 2 V
MDC IDC LEAD LOCATION: 753859
MDC IDC LEAD LOCATION: 753860
MDC IDC MSMT BATTERY REMAINING LONGEVITY: 75 mo
MDC IDC MSMT LEADCHNL RA PACING THRESHOLD PULSEWIDTH: 0.4 ms
MDC IDC MSMT LEADCHNL RA SENSING INTR AMPL: 4.25 mV
MDC IDC MSMT LEADCHNL RV IMPEDANCE VALUE: 551 Ohm
MDC IDC MSMT LEADCHNL RV SENSING INTR AMPL: 23.75 mV
MDC IDC PG IMPLANT DT: 20160202
MDC IDC SESS DTM: 20190909170310
MDC IDC SET LEADCHNL RV PACING AMPLITUDE: 2.5 V
MDC IDC SET LEADCHNL RV PACING PULSEWIDTH: 0.4 ms
MDC IDC SET LEADCHNL RV SENSING SENSITIVITY: 2.8 mV
MDC IDC STAT BRADY AP VS PERCENT: 99.86 %
MDC IDC STAT BRADY RA PERCENT PACED: 99.9 %
MDC IDC STAT BRADY RV PERCENT PACED: 0.07 %

## 2018-03-21 ENCOUNTER — Ambulatory Visit: Payer: Medicare Other | Admitting: Psychiatry

## 2018-04-04 ENCOUNTER — Ambulatory Visit (INDEPENDENT_AMBULATORY_CARE_PROVIDER_SITE_OTHER): Payer: Medicare Other | Admitting: Psychiatry

## 2018-04-04 DIAGNOSIS — F331 Major depressive disorder, recurrent, moderate: Secondary | ICD-10-CM | POA: Diagnosis not present

## 2018-04-26 ENCOUNTER — Ambulatory Visit (INDEPENDENT_AMBULATORY_CARE_PROVIDER_SITE_OTHER): Payer: Medicare Other | Admitting: Psychiatry

## 2018-04-26 DIAGNOSIS — F331 Major depressive disorder, recurrent, moderate: Secondary | ICD-10-CM

## 2018-05-12 ENCOUNTER — Encounter (HOSPITAL_COMMUNITY): Payer: Self-pay | Admitting: Emergency Medicine

## 2018-05-12 ENCOUNTER — Other Ambulatory Visit: Payer: Self-pay

## 2018-05-12 ENCOUNTER — Emergency Department (HOSPITAL_COMMUNITY)
Admission: EM | Admit: 2018-05-12 | Discharge: 2018-05-12 | Disposition: A | Discharge status: Home / Self Care | Payer: Medicare Other | Attending: Emergency Medicine | Admitting: Emergency Medicine

## 2018-05-12 DIAGNOSIS — J3489 Other specified disorders of nose and nasal sinuses: Secondary | ICD-10-CM

## 2018-05-12 DIAGNOSIS — J029 Acute pharyngitis, unspecified: Secondary | ICD-10-CM | POA: Diagnosis present

## 2018-05-12 DIAGNOSIS — J45909 Unspecified asthma, uncomplicated: Secondary | ICD-10-CM | POA: Insufficient documentation

## 2018-05-12 DIAGNOSIS — E119 Type 2 diabetes mellitus without complications: Secondary | ICD-10-CM | POA: Diagnosis not present

## 2018-05-12 DIAGNOSIS — Z79899 Other long term (current) drug therapy: Secondary | ICD-10-CM | POA: Diagnosis not present

## 2018-05-12 DIAGNOSIS — Z950 Presence of cardiac pacemaker: Secondary | ICD-10-CM | POA: Diagnosis not present

## 2018-05-12 DIAGNOSIS — Z87891 Personal history of nicotine dependence: Secondary | ICD-10-CM | POA: Insufficient documentation

## 2018-05-12 DIAGNOSIS — I10 Essential (primary) hypertension: Secondary | ICD-10-CM | POA: Diagnosis not present

## 2018-05-12 MED ORDER — DOXYCYCLINE HYCLATE 100 MG PO CAPS: 100.0000 mg | ORAL_CAPSULE | Freq: Two times a day (BID) | ORAL | 0 refills | Status: DC

## 2018-05-12 NOTE — ED Notes (Signed)
Bed: WTR6 Expected date:  Expected time:  Means of arrival:  Comments: 

## 2018-05-12 NOTE — ED Provider Notes (Signed)
[facility] DEPT Provider Note   CSN: [id number] Arrival date & time: 05/12/18  1105     History   Chief Complaint Chief Complaint  Patient presents with  . Sore Throat    HPI [name] is a 62 y.o. female extensive medical history as below who presents emergency department today for sinus pressure and sore throat.  Patient reports that she was initially seen last month by her primary care provider for a sinus infection.  She was prescribed Cefdnir but never took this.  She reports she went to her cardiologist afterwards who started her on amoxicillin and she took this for what she thinks is the full course.  She notes that her sinus infection resolved.  She reports however that on Thanksgiving day she began having sinus congestion, and sinus pressure again.  She notes since that time she has not had left ear pain, sinus pressure, sinus congestion, postnasal drip and a sore throat.  The patient saw her cardiologist yesterday and was started on prednisone 10 mg.  She does not think this is helping.  She reports that she is a "chronic pain patient and anything that hurts makes my pain much worse".  She notes she has been taking Percocet for her pain which is her baseline medication.  Patient denies any fever, chills, headache, rash, cough, chest pain, shortness of breath or other symptoms.  She is in control of her secretions and talking without difficulty.  HPI  Past Medical History:  Diagnosis Date  . Allergy   . Anal fissure 02/2012  . Anxiety   . Arthritis   . Asthma   . Bursitis    right hip  . Complication of anesthesia     woke up during a colonscopy, difficult to wake up post-op sometimes , requires lots of blankets to stay warm post op  . Depression   . Diabetes mellitus without complication ([place])    type 2 diet controlled  . Fatty liver    sees dr Collene Mares for  . Fibromyalgia   . Gallstones 02/15/2015  . GERD (gastroesophageal reflux disease)     . Hepatitis A infection 1979  . Hyperlipidemia   . Hypertension   . Irritable bowel syndrome (IBS)   . 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 Psoriasis    left foot  . 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 Tarsal tunnel syndrome of left side     Patient Active Problem List   Diagnosis Date Noted  . Aortic atherosclerosis ([place]) 10/18/2017  . Acute coronary syndrome ([place]) 02/09/2017  . Right knee pain 07/04/2016  . Pain in right ankle and joints of right foot 07/04/2016  . Psoriasis 03/10/2015  . Right foot pain 11/13/2014  . Ethmoid sinusitis 11/13/2014  . Abdominal pain, epigastric 11/13/2014  . Vitamin D deficiency 11/13/2014  . 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 Sick sinus syndrome ([place]) 07/08/2014  . Chest pain at rest 07/05/2014  . Chronic rhinitis 10/22/2013  . Fatty liver 10/22/2013  . Interstitial cystitis 10/22/2013  . Hip pain, right 10/22/2013  . Chronic insomnia 09/25/2012  . HTN (hypertension) 09/25/2012  . Hyperlipidemia 09/25/2012  . Diabetes mellitus type 2, controlled, without complications ([place]) 33/82/5053  . GERD 12/04/2008  . History of colonic polyps 12/04/2008  . CONSTIPATION 10/21/2008  . Fibromyalgia 10/21/2008  . LACTOSE INTOLERANCE 11/20/2007  . Chest pain, precordial 11/20/2007  .  Anxiety and depression 08/02/2007  . Osteoporosis 08/02/2007  . Diverticulosis of large intestine 11/16/2006  . EXTERNAL HEMORRHOIDS 05/12/2005    Past Surgical History:  Procedure Laterality Date  . ABDOMINAL HYSTERECTOMY  09/1994   partial  . APPENDECTOMY    . BILATERAL SALPINGOOPHORECTOMY Bilateral 04/2011  . CARDIAC CATHETERIZATION  10/08/2007  . CESAREAN SECTION     x 1  . CHOLECYSTECTOMY N/A 02/16/2015   Procedure: LAPAROSCOPIC CHOLECYSTECTOMY WITH INTRAOPERATIVE CHOLANGIOGRAM;  Surgeon: Johnathan Hausen, MD;  Location: [facility];  Service: General;  Laterality: N/A;  .  COLONOSCOPY WITH PROPOFOL N/A 08/19/2016   Procedure: COLONOSCOPY WITH PROPOFOL;  Surgeon: Carol Ada, MD;  Location: [facility];  Service: Endoscopy;  Laterality: N/A;  . CYSTOSCOPY W/ URETERAL STENT PLACEMENT  08/04/2009  . EXAMINATION UNDER ANESTHESIA  02/17/2012   Procedure: EXAM UNDER ANESTHESIA;  Surgeon: Pedro Earls, MD;  Location: [facility];  Service: General;  Laterality: N/A;  Exam under anesthesia with lateral internal sphincterotomy  . LAPAROSCOPIC LYSIS INTESTINAL ADHESIONS  08/04/2009  . LEFT HEART CATH AND CORONARY ANGIOGRAPHY N/A 02/10/2017   Procedure: LEFT HEART CATH AND CORONARY ANGIOGRAPHY;  Surgeon: Dixie Dials, MD;  Location: [facility];  Service: Cardiovascular;  Laterality: N/A;  . PERMANENT PACEMAKER INSERTION N/A 07/08/2014   MDT MRI compatible dual chamber pacemaker implanted by Dr Rayann Heman for symptomatic bradycardia  . SHOULDER ARTHROSCOPY W/ ROTATOR CUFF REPAIR Left 1999  . SMALL INTESTINE SURGERY    . SPHINCTEROTOMY  02/17/2012   Procedure: SPHINCTEROTOMY;  Surgeon: Pedro Earls, MD;  Location: [facility];  Service: General;  Laterality: N/A;     OB History   None      Home Medications    Prior to Admission medications   Medication Sig Start Date End Date Taking? Authorizing Provider  ALPRAZolam Duanne Moron) 0.5 MG tablet Take 1 tablet by mouth as needed. 08/16/17   [provider]  azelastine (ASTELIN) 0.1 % nasal spray Place 2 sprays into both nostrils 2 (two) times daily. Use in each nostril as directed 10/18/17   [name], PA  Azilsartan Medoxomil (EDARBI) 40 MG TABS Take 40 mg by mouth daily.     [provider]  beclomethasone (QVAR) 40 MCG/ACT inhaler Inhale 2 puffs into the lungs 2 (two) times daily as needed (for shortness of breath).     [provider]  Blood Glucose Monitoring Suppl (ACCU-CHEK GUIDE) w/Device KIT USE DAILY AS DIRECTED 05/17/17   [name], PA    calcipotriene (DOVONOX) 0.005 % cream Apply 1 application topically daily. 10/30/17   [provider]  Calcium-Magnesium 500-250 MG TABS Take one tablet twice daily 10/18/17   [name], PA  cetirizine (ZYRTEC) 10 MG tablet TAKE 1 TABLET (10 MG TOTAL) BY MOUTH DAILY. 11/21/17   [name], PA  Cholecalciferol (VITAMIN D) 2000 units CAPS Take 1 capsule (2,000 Units total) by mouth daily. 10/18/17   [name], PA  ciprofloxacin (CIPRO) 500 MG tablet Take 500 mg by mouth 2 (two) times daily as needed (for diverticulitis).    [provider]  cyclobenzaprine (FLEXERIL) 10 MG tablet Take 10 mg by mouth 3 (three) times daily as needed for muscle spasms.     [provider]  desoximetasone (TOPICORT) 0.25 % cream Apply 1 application topically daily. 10/30/17   [provider]  Desoximetasone (TOPICORT) 0.25 % ointment Apply 1 application topically 2 (two) times daily as needed (for psoriasis). 11/13/14   Jacqulynn Cadet,  [name], PA  dexlansoprazole (DEXILANT) 60 MG capsule Take 60 mg by mouth daily as needed (for acid reflux).     [provider]  diazepam (VALIUM) 5 MG tablet Take 1 tablet (5 mg total) by mouth daily as needed for anxiety or muscle spasms. Patient not taking: Reported on 01/25/2018 11/26/15   [name], PA  dicyclomine (BENTYL) 10 MG capsule TAKE 1 CAPSULE (10 MG TOTAL) BY MOUTH 4 (FOUR) TIMES DAILY - BEFORE MEALS AND AT BEDTIME. Patient taking differently: Take 10 mg by mouth 4 (four) times daily as needed for spasms.  12/31/16   [name], PA  diphenoxylate-atropine (LOMOTIL) 2.5-0.025 MG tablet Take 2 tablets by mouth 4 (four) times daily as needed for diarrhea or loose stools.     [provider]  docusate sodium (COLACE) 100 MG capsule TAKE 1 CAPSULE (100 MG TOTAL) BY MOUTH DAILY AS NEEDED FOR MILD CONSTIPATION. 10/23/14   [name], PA  escitalopram (LEXAPRO) 20 MG tablet Take 20 mg by mouth daily 10/18/17    [name], PA  ezetimibe (ZETIA) 10 MG tablet Take 1 tablet (10 mg total) by mouth every evening. 10/18/17   [name], PA  FLOVENT HFA 110 MCG/ACT inhaler Take 2 puffs by mouth as needed. 08/29/17   [provider]  fluticasone (FLONASE) 50 MCG/ACT nasal spray Place 2 sprays into both nostrils daily. Patient taking differently: Place 2 sprays into both nostrils as needed.  10/18/17   Jeffery, [name], PA  LOTEMAX SM 0.38 % GEL Place 1 drop into both eyes as needed. 10/18/17   [provider]  magic mouthwash w/lidocaine SOLN Take 10 mLs by mouth every 2 (two) hours as needed for mouth pain. Patient not taking: Reported on 01/25/2018 03/24/17   [name], PA  meloxicam (MOBIC) 15 MG tablet TAKE 1 TABLET BY MOUTH EVERY DAY Patient taking differently: TAKE 15 MG BY MOUTH EVERY DAY. AS NEEDED 02/24/16   [name], PA  metoprolol tartrate (LOPRESSOR) 25 MG tablet Take 0.5 tablets (12.5 mg total) by mouth 2 (two) times daily. 02/11/17   Dixie Dials, MD  Multiple Vitamin (MULTIVITAMIN) tablet Take 1 tablet by mouth daily.    [provider]  NITROSTAT 0.4 MG SL tablet Place 0.4 mg under the tongue every 5 (five) minutes as needed for chest pain. For chest pain. 11/24/11   [provider]  olopatadine (PATANOL) 0.1 % ophthalmic solution Place 1 drop into both eyes 2 (two) times daily. 10/18/17   [name], PA  ondansetron (ZOFRAN ODT) 4 MG disintegrating tablet Take 1 tablet (4 mg total) by mouth every 8 (eight) hours as needed for nausea. 11/20/16   Larene Pickett, PA-C  Peppermint Oil (IBGARD PO) Take by mouth daily.    [provider]  PROAIR HFA 108 (90 Base) MCG/ACT inhaler INHALE 2 PUFFS 4 TIMES A DAY AS NEEDED Patient taking differently: INHALE 2 PUFFS 4 TIMES A DAY AS NEEDED FOR SHORTNESS OF BREATH 09/30/16   [name], PA  Probiotic Product (PROBIOTIC DAILY) CAPS Take 1 capsule by mouth daily.     [provider]    ranitidine (ZANTAC) 150 MG tablet Take 150 mg by mouth daily as needed for heartburn.     [provider]  traZODone (DESYREL) 50 MG tablet Take 0.5-1 tablets (25-50 mg total) by mouth at bedtime. Patient not taking: Reported on 01/25/2018 07/18/17   [name], PA  zolpidem (AMBIEN) 10 MG tablet Take 10 mg by mouth  at bedtime as needed for sleep.  09/28/13   [provider]    Family History Family History  Problem Relation Age of Onset  . Cancer Mother        liver  . Diabetes Mother   . Hyperlipidemia Mother   . Hypertension Mother   . Stroke Mother   . Cancer Maternal Grandmother        breast  . Anesthesia problems Sister        hard to wake up post-op  . Heart disease Father   . Hyperlipidemia Brother   . HIV/AIDS Brother   . Cancer Maternal Grandfather        colon  . Diabetes Brother   . Cancer Brother        lung  . Cancer Paternal Grandfather        COLON    Social History Social History   Tobacco Use  . Smoking status: Former Smoker    Packs/day: 0.50    Years: 37.00    Pack years: 18.50    Types: Cigarettes    Last attempt to quit: 06/05/2012    Years since quitting: 5.9  . Smokeless tobacco: Never Used  Substance Use Topics  . Alcohol use: No  . Drug use: No     Allergies   Codeine; Propoxyphene n-acetaminophen; Sulfonamide derivatives; Alka-seltzer [aspirin effervescent]; Norvasc [amlodipine besylate]; Red dye; Singulair [montelukast sodium]; Bupropion; Iohexol; and Latex   Review of Systems Review of Systems  All other systems reviewed and are negative.    Physical Exam Updated Vital Signs BP (!) 140/95 (BP Location: Left Arm)   Pulse 79   Temp 98 F (36.7 C) (Oral)   Resp 18   Wt 74.4 kg   SpO2 98%   BMI 25.69 kg/m   Physical Exam  Constitutional: She appears well-developed and well-nourished.  HENT:  Head: Normocephalic and atraumatic.  Right Ear: Tympanic membrane and external ear normal.  Left Ear:  Tympanic membrane and external ear normal.  Nose: Mucosal edema and rhinorrhea present. Right sinus exhibits maxillary sinus tenderness. Right sinus exhibits no frontal sinus tenderness. Left sinus exhibits maxillary sinus tenderness. Left sinus exhibits no frontal sinus tenderness.  Mouth/Throat: Uvula is midline, oropharynx is clear and moist and mucous membranes are normal. No tonsillar exudate.  No mastoid erythema, edema or tenderness.  There is no obliteration of the postauricular crease.  TMs are pearly gray, intact with cone of light seen bilaterally. The patient has normal phonation and is in control of secretions. No stridor.  Midline uvula without edema. Soft palate rises symmetrically.  Mild tonsillar erythema without edema or exudates. No PTA. Tongue protrusion is normal. No trismus. Patient has fair dentition and there is no evidence of dental abscess. No gingival erythema or fluctuance noted. Mucus membranes moist.   Eyes: Pupils are equal, round, and reactive to light. Right eye exhibits no discharge. Left eye exhibits no discharge. No scleral icterus.  Neck: Trachea normal. Neck supple. No spinous process tenderness present. No neck rigidity. Normal range of motion present.  No submandibular or submental edema.  No crepitus on neck palpation. No nuchal rigidity or meningismus  Cardiovascular: Normal rate, regular rhythm and intact distal pulses.  No murmur heard. Pulses:      Radial pulses are 2+ on the right side, and 2+ on the left side.       Dorsalis pedis pulses are 2+ on the right side, and 2+ on the left side.  Posterior tibial pulses are 2+ on the right side, and 2+ on the left side.  Pulmonary/Chest: Effort normal and breath sounds normal. She exhibits no tenderness.  No increased work of breathing. No accessory muscle use. Patient is sitting upright, speaking in full sentences without difficulty   Abdominal: Soft. Bowel sounds are normal. There is no tenderness. There  is no rebound and no guarding.  Musculoskeletal: She exhibits no edema.  Lymphadenopathy:    She has no cervical adenopathy.  Neurological: She is alert.  Skin: Skin is warm and dry. No rash noted. She is not diaphoretic.  No vesicular-like rash noted on facial dermatomes  Psychiatric: She has a normal mood and affect.  Nursing note and vitals reviewed.    ED Treatments / Results  Labs (all labs ordered are listed, but only abnormal results are displayed) Labs Reviewed - No data to display  EKG None  Radiology No results found.  Procedures Procedures (including critical care time)  Medications Ordered in ED Medications - No data to display   Initial Impression / Assessment and Plan / ED Course  I have reviewed the triage vital signs and the nursing notes.  Pertinent labs & imaging results that were available during my care of the patient were reviewed by me and considered in my medical decision making (see chart for details).     62 year old female presenting to the emergent department today for sinus pressure and sore throat.  Patient is well-appearing on exam.  Vital signs are reassuring without fever, tachycardia, tachypnea, hypoxia or hypotension.  There is no evidence of mastoiditis on exam.  No meningeal signs.  Oropharyngeal exam is without tonsillar exudates.  No evidence of PTA or RPA.  Patient is in control of her secretions.  There is no trismus.  Patient does have sinus pressure on exam.  Given duration of patient's symptoms suspect bacterial sinusitis.  Patient was treated with antibiotics last month with resolution of symptoms.  Family states they do not wish to have similar medications of amoxicillin or Cefdnir they do not think it worked although symptoms resolve.  I will try a trial of doxycycline.  Recommended over-the-counter medications to help with sinus pressure and pain.  Discussed the doxycycline can lead to light sensitivity.  No further work-up indicated  at this time.  Will discharge home. I advised the patient to follow-up with PCP in the next 1 week to ensure resolution of symptoms. Specific return precautions discussed. Time was given for all questions to be answered. The patient verbalized understanding and agreement with plan. The patient appears safe for discharge home.  Final Clinical Impressions(s) / ED Diagnoses   Final diagnoses:  Sinus pressure  Sore throat    ED Discharge Orders         Ordered    doxycycline (VIBRAMYCIN) 100 MG capsule  2 times daily     05/12/18 1403           Lorelle Gibbs 05/12/18 1407    Lacretia Leigh, MD 05/13/18 [phone]

## 2018-05-12 NOTE — Discharge Instructions (Signed)
You were seen here today for sore throat and sinus pressure.   I am starting you on an antibiotic. Please take all of your antibiotics until finished!   You may develop abdominal discomfort or diarrhea from the antibiotic.  You may help offset this with probiotics which you can buy or get in yogurt. Do not eat or take the probiotics until 2 hours after your antibiotic. Do not take your medicine if develop an itchy rash, swelling in your mouth or lips, or difficulty breathing. Please note this medication can cause sensitivity to the light. Please wear sunscreen when outside.   Please see attached handouts.   Please follow up with your primary care provider for follow up in hte next 1 week.   If you develop worsening or new concerning symptoms you can return to the emergency department for re-evaluation.  Return sooner if  You have a severe headache.  You have persistent vomiting.  You have pain or swelling around your face or eyes.  You have vision problems.  You develop confusion.  Your neck is stiff.  You develop a fever  You have trouble breathing.  You cannot swallow fluids, soft foods, or your saliva.  You have swelling in your throat or neck that gets worse.  You keep feeling like you are going to throw up (vomit).  You keep throwing up.

## 2018-05-12 NOTE — ED Triage Notes (Signed)
Pt reports sore throat and l/ear pain and sinus infection x 10 days. PCP initiated antibiotics. Cardiologist initiated Prednisone 11m,  3 days ago. Stated that strep test was not done. Pt reports no relief of pain. Family at bedside

## 2018-05-15 ENCOUNTER — Ambulatory Visit: Payer: Medicare Other | Admitting: Psychiatry

## 2018-05-18 ENCOUNTER — Encounter: Payer: Self-pay | Admitting: Cardiology

## 2018-05-24 DIAGNOSIS — K219 Gastro-esophageal reflux disease without esophagitis: Secondary | ICD-10-CM | POA: Insufficient documentation

## 2018-05-24 DIAGNOSIS — S0340XA Sprain of jaw, unspecified side, initial encounter: Secondary | ICD-10-CM | POA: Insufficient documentation

## 2018-05-25 ENCOUNTER — Ambulatory Visit (INDEPENDENT_AMBULATORY_CARE_PROVIDER_SITE_OTHER): Payer: Medicare Other

## 2018-05-25 DIAGNOSIS — I495 Sick sinus syndrome: Secondary | ICD-10-CM | POA: Diagnosis not present

## 2018-05-25 NOTE — Progress Notes (Signed)
Remote pacemaker transmission.   

## 2018-06-24 LAB — CUP PACEART REMOTE DEVICE CHECK
Brady Statistic AP VP Percent: 0.06 %
Brady Statistic AP VS Percent: 99.85 %
Brady Statistic AS VP Percent: 0 %
Brady Statistic AS VS Percent: 0.09 %
Brady Statistic RA Percent Paced: 99.85 %
Date Time Interrogation Session: 20191219233331
Implantable Lead Implant Date: 20160202
Implantable Lead Location: 753860
Implantable Lead Model: 5076
Implantable Lead Model: 5076
Lead Channel Impedance Value: 456 Ohm
Lead Channel Pacing Threshold Amplitude: 0.5 V
Lead Channel Pacing Threshold Amplitude: 0.875 V
Lead Channel Pacing Threshold Pulse Width: 0.4 ms
Lead Channel Pacing Threshold Pulse Width: 0.4 ms
Lead Channel Sensing Intrinsic Amplitude: 25.375 mV
Lead Channel Sensing Intrinsic Amplitude: 3.5 mV
Lead Channel Sensing Intrinsic Amplitude: 3.5 mV
Lead Channel Setting Pacing Pulse Width: 0.4 ms
MDC IDC LEAD IMPLANT DT: 20160202
MDC IDC LEAD LOCATION: 753859
MDC IDC MSMT BATTERY REMAINING LONGEVITY: 75 mo
MDC IDC MSMT BATTERY VOLTAGE: 3.01 V
MDC IDC MSMT LEADCHNL RA IMPEDANCE VALUE: 437 Ohm
MDC IDC MSMT LEADCHNL RA IMPEDANCE VALUE: 494 Ohm
MDC IDC MSMT LEADCHNL RV IMPEDANCE VALUE: 570 Ohm
MDC IDC MSMT LEADCHNL RV SENSING INTR AMPL: 25.375 mV
MDC IDC PG IMPLANT DT: 20160202
MDC IDC SET LEADCHNL RA PACING AMPLITUDE: 2 V
MDC IDC SET LEADCHNL RV PACING AMPLITUDE: 2.5 V
MDC IDC SET LEADCHNL RV SENSING SENSITIVITY: 2.8 mV
MDC IDC STAT BRADY RV PERCENT PACED: 0.07 %

## 2018-07-18 NOTE — Progress Notes (Signed)
Office Visit Note  Patient: [name]             Date of Birth: [birth year]           MRN: [id number]             PCP: [name], PA Referring: [name], PA Visit Date: 08/01/2018 Occupation: @GUAROCC @  Subjective:  Pain in neck  History of Present Illness: [name] is a 63 y.o. female with history of fibromyalgia and DDD. Patient reports she is having pain with swallowing that began in November 2019, which has improved since then and saw her ENT doctor in 05/2018 which he attributed to her fibromyalgia. Today she is having pain and stiffness in her left trapezius area. She reports general malaise, fatigue and feels achy. Denies recent illness or fever. Reports insomnia and takes sleeping medication only when needed. She rates her fibromyalgia pain a 10 on a scale from 0-10. She states the injection she received in her right trapezius area at her last visit improved her pain and requests one today for left side. She is starting physical therapy in 2 weeks, which helps improve her myalgia and stiffness. She currently has a psoriasis rash on her left foot.  Activities of Daily Living:  Patient reports morning stiffness for  hours.   Patient Reports nocturnal pain.  Difficulty dressing/grooming: Reports Difficulty climbing stairs: Denies Difficulty getting out of chair: Reports Difficulty using hands for taps, buttons, cutlery, and/or writing: Denies  Review of Systems  Constitutional: Positive for fatigue.  HENT: Negative for mouth sores, mouth dryness and nose dryness.   Eyes: Negative for pain, visual disturbance and dryness.  Respiratory: Negative for cough, hemoptysis, shortness of breath and difficulty breathing.   Cardiovascular: Negative for chest pain, palpitations, hypertension and swelling in legs/feet.  Gastrointestinal: Negative for blood in stool, constipation and diarrhea.  Endocrine: Negative for increased urination.  Genitourinary: Negative for painful  urination.  Musculoskeletal: Positive for arthralgias, joint pain, myalgias, muscle weakness, morning stiffness, muscle tenderness and myalgias. Negative for joint swelling.  Skin: Positive for rash. Negative for color change, pallor, hair loss, nodules/bumps, skin tightness, ulcers and sensitivity to sunlight.  Allergic/Immunologic: Negative for susceptible to infections.  Neurological: Negative for dizziness, numbness, headaches and weakness.  Hematological: Negative for swollen glands.  Psychiatric/Behavioral: Positive for sleep disturbance. Negative for depressed mood. The patient is nervous/anxious.     PMFS History:  Patient Active Problem List   Diagnosis Date Noted  . Aortic atherosclerosis ([place]) 10/18/2017  . Acute coronary syndrome ([place]) 02/09/2017  . Right knee pain 07/04/2016  . Pain in right ankle and joints of right foot 07/04/2016  . Psoriasis 03/10/2015  . Right foot pain 11/13/2014  . Ethmoid sinusitis 11/13/2014  . Abdominal pain, epigastric 11/13/2014  . Vitamin D deficiency 11/13/2014  . 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 Sick sinus syndrome ([place]) 07/08/2014  . Chest pain at rest 07/05/2014  . Chronic rhinitis 10/22/2013  . Fatty liver 10/22/2013  . Interstitial cystitis 10/22/2013  . Hip pain, right 10/22/2013  . Chronic insomnia 09/25/2012  . HTN (hypertension) 09/25/2012  . Hyperlipidemia 09/25/2012  . Diabetes mellitus type 2, controlled, without complications ([place]) 93/79/0240  . GERD 12/04/2008  . History of colonic polyps 12/04/2008  . CONSTIPATION 10/21/2008  . Fibromyalgia 10/21/2008  . LACTOSE INTOLERANCE 11/20/2007  . Chest pain, precordial 11/20/2007  . Anxiety and depression 08/02/2007  . Osteoporosis 08/02/2007  . Diverticulosis of large intestine 11/16/2006  . EXTERNAL HEMORRHOIDS 05/12/2005  Past Medical History:  Diagnosis Date  . Allergy   . Anal fissure 02/2012  . Anxiety   . Arthritis   . Asthma   . Bursitis    right hip  .  Complication of anesthesia     woke up during a colonscopy, difficult to wake up post-op sometimes , requires lots of blankets to stay warm post op  . Depression   . Diabetes mellitus without complication ([place])    type 2 diet controlled  . Fatty liver    sees dr Collene Mares for  . Fibromyalgia   . Gallstones 02/15/2015  . GERD (gastroesophageal reflux disease)   . Hepatitis A infection 1979  . Hyperlipidemia   . Hypertension   . Irritable bowel syndrome (IBS)   . 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 Psoriasis    left foot  . 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 Tarsal tunnel syndrome of left side     Family History  Problem Relation Age of Onset  . Cancer Mother        liver  . Diabetes Mother   . Hyperlipidemia Mother   . Hypertension Mother   . Stroke Mother   . Cancer Maternal Grandmother        breast  . Anesthesia problems Sister        hard to wake up post-op  . Heart disease Father   . Hyperlipidemia Brother   . HIV/AIDS Brother   . Cancer Maternal Grandfather        colon  . Diabetes Brother   . Cancer Brother        lung  . Cancer Paternal Grandfather        COLON   Past Surgical History:  Procedure Laterality Date  . ABDOMINAL HYSTERECTOMY  09/1994   partial  . APPENDECTOMY    . BILATERAL SALPINGOOPHORECTOMY Bilateral 04/2011  . CARDIAC CATHETERIZATION  10/08/2007  . CESAREAN SECTION     x 1  . CHOLECYSTECTOMY N/A 02/16/2015   Procedure: LAPAROSCOPIC CHOLECYSTECTOMY WITH INTRAOPERATIVE CHOLANGIOGRAM;  Surgeon: Johnathan Hausen, MD;  [facility];  Service: General;  Laterality: N/A;  . COLONOSCOPY WITH PROPOFOL N/A 08/19/2016   Procedure: COLONOSCOPY WITH PROPOFOL;  Surgeon: Carol Ada, MD;  [facility];  Service: Endoscopy;  Laterality: N/A;  . CYSTOSCOPY W/ URETERAL STENT PLACEMENT  08/04/2009  . EXAMINATION UNDER ANESTHESIA  02/17/2012   Procedure: EXAM  UNDER ANESTHESIA;  Surgeon: Pedro Earls, MD;  Location: [place];  Service: General;  Laterality: N/A;  Exam under anesthesia with lateral internal sphincterotomy  . LAPAROSCOPIC LYSIS INTESTINAL ADHESIONS  08/04/2009  . LEFT HEART CATH AND CORONARY ANGIOGRAPHY N/A 02/10/2017   Procedure: LEFT HEART CATH AND CORONARY ANGIOGRAPHY;  Surgeon: Dixie Dials, MD;  Location: [facility];  Service: Cardiovascular;  Laterality: N/A;  . PERMANENT PACEMAKER INSERTION N/A 07/08/2014   MDT MRI compatible dual chamber pacemaker implanted by Dr Rayann Heman for symptomatic bradycardia  . SHOULDER ARTHROSCOPY W/ ROTATOR CUFF REPAIR Left 1999  . SMALL INTESTINE SURGERY    . SPHINCTEROTOMY  02/17/2012   Procedure: SPHINCTEROTOMY;  Surgeon: Pedro Earls, MD;  Location: [facility];  Service: General;  Laterality: N/A;   Social History   Social History Narrative   Lives alone.  Daughter and granddaughter live in [place], [place]. Pt exercise sometimes.  Immunization History  Administered Date(s) Administered  . Influenza,inj,Quad PF,6+ Mos 03/10/2015, 04/12/2016, 03/24/2017  . Influenza-Unspecified 03/17/2014  . Pneumococcal Polysaccharide-23 03/10/2015  . Td 02/16/2012  . Tdap 02/16/2012     Objective: Vital Signs: BP (!) 154/94 (BP Location: Right Arm, Patient Position: Sitting, Cuff Size: Normal)   Pulse 84   Resp 15   Ht 5' 7"  (1.702 m)   Wt 167 lb 6.4 oz (75.9 kg)   BMI 26.22 kg/m    Physical Exam Vitals signs and nursing note reviewed.  Constitutional:      Appearance: She is well-developed.  HENT:     Head: Normocephalic and atraumatic.  Eyes:     Conjunctiva/sclera: Conjunctivae normal.  Neck:     Musculoskeletal: Normal range of motion.  Cardiovascular:     Rate and Rhythm: Normal rate and regular rhythm.     Heart sounds: Normal heart sounds.  Pulmonary:     Effort: Pulmonary effort is normal.     Breath sounds: Normal breath sounds.    Abdominal:     General: Bowel sounds are normal.     Palpations: Abdomen is soft.  Lymphadenopathy:     Cervical: No cervical adenopathy.  Skin:    General: Skin is warm and dry.     Capillary Refill: Capillary refill takes less than 2 seconds.  Neurological:     Mental Status: She is alert and oriented to person, place, and time.  Psychiatric:        Behavior: Behavior normal.      Musculoskeletal Exam: C-spine thoracic lumbar spine limited range of motion with discomfort. Limited left shoulder range of motion, right shoulder joint, elbow joints, wrist joints MCPs PIPs DIPs were in good range of motion with no synovitis. Knee joints, ankles MTPs PIPs DIPs in good range of motion with no synovitis. Unable to perform hip range of motion due to discomfort and stiffness. Generalized hyperalgesia.  CDAI Exam: CDAI Score: Not documented Patient Global Assessment: Not documented; Provider Global Assessment: Not documented Swollen: Not documented; Tender: Not documented Joint Exam   Not documented   There is currently no information documented on the homunculus. Go to the Rheumatology activity and complete the homunculus joint exam.  Investigation: No additional findings.  Imaging: No results found.  Recent Labs: Lab Results  Component Value Date   WBC 5.9 10/18/2017   HGB 12.5 10/18/2017   PLT 296 10/18/2017   NA 143 10/18/2017   K 4.4 10/18/2017   CL 102 10/18/2017   CO2 25 10/18/2017   GLUCOSE 99 10/18/2017   BUN 8 10/18/2017   CREATININE 0.73 10/18/2017   BILITOT 0.7 10/18/2017   ALKPHOS 55 10/18/2017   AST 32 10/18/2017   ALT 54 (H) 10/18/2017   PROT 7.4 10/18/2017   ALBUMIN 4.6 10/18/2017   CALCIUM 9.5 10/18/2017   GFRAA 102 10/18/2017    Speciality Comments: No specialty comments available.  Procedures:  Trigger Point Inj Date/Time: 08/01/2018 11:50 AM Performed by: Bo Merino, MD Authorized by: Bo Merino, MD   Consent Given by:   Patient Site marked: the procedure site was marked   Timeout: prior to procedure the correct patient, procedure, and site was verified   Indications:  Muscle spasm and pain Total # of Trigger Points:  1 Location: neck   Needle Size:  27 G Approach:  Dorsal Medications #1:  0.5 mL lidocaine 1 %; 10 mg triamcinolone acetonide 40 MG/ML Patient tolerance:  Patient tolerated the procedure well with no  immediate complications   Allergies: Codeine; Propoxyphene n-acetaminophen; Sulfonamide derivatives; Alka-seltzer [aspirin effervescent]; Norvasc [amlodipine besylate]; Red dye; Singulair [montelukast sodium]; Bupropion; Iohexol; and Latex   Assessment / Plan:     Visit Diagnoses: Fibromyalgia- patient continues to have generalized myalgias and positive tender points. Recommend to start physical therapy as she has planned to improve generalized pain and stiffness.  Chronic insomnia- good sleep hygiene was discussed  Neck pain- cortisone injection performed in left trapezius area per patient's request. She tolerated the procedure well.  DDD (degenerative disc disease), lumbar- patient has lower back discomfort and stiffness  Psoriasis- she has psoriasis on her left foot and is followed by dermatologist  Osteopenia, unspecified location - 10/07/60-V score -2.1, Calicum and vitamin D supplements, repeat DEXA in 2 years   Vitamin D deficiency- patient is on vitamin D supplement  Diverticulosis of large intestine without hemorrhage  Essential hypertension  History of hypercholesterolemia  Sick sinus syndrome (HCC)  History of IBS  History of asthma  Anxiety and depression  History of gastroesophageal reflux (GERD)   Orders: Orders Placed This Encounter  Procedures  . Trigger Point Inj   No orders of the defined types were placed in this encounter.     Follow-Up Instructions: No follow-ups on file.   Bo Merino, MD  Note - This record has been created using Radio producer.  Chart creation errors have been sought, but may not always  have been located. Such creation errors do not reflect on  the standard of medical care.

## 2018-07-21 IMAGING — DX DG HIP (WITH OR WITHOUT PELVIS) 2-3V*R*
3 series · 3 of 3 positions shown · non-contrast
Comparison: None.

CLINICAL DATA: Pt here for follow up of fall. Pt fell on 02/09/16
while in the grocery store. She slipped on a grape while pushing her
cart and fell on her right side. She hit directly on her right knee
and then fell onto the right hip.

EXAM:
DG HIP (WITH OR WITHOUT PELVIS) 2-3V RIGHT

[pelvis ap]
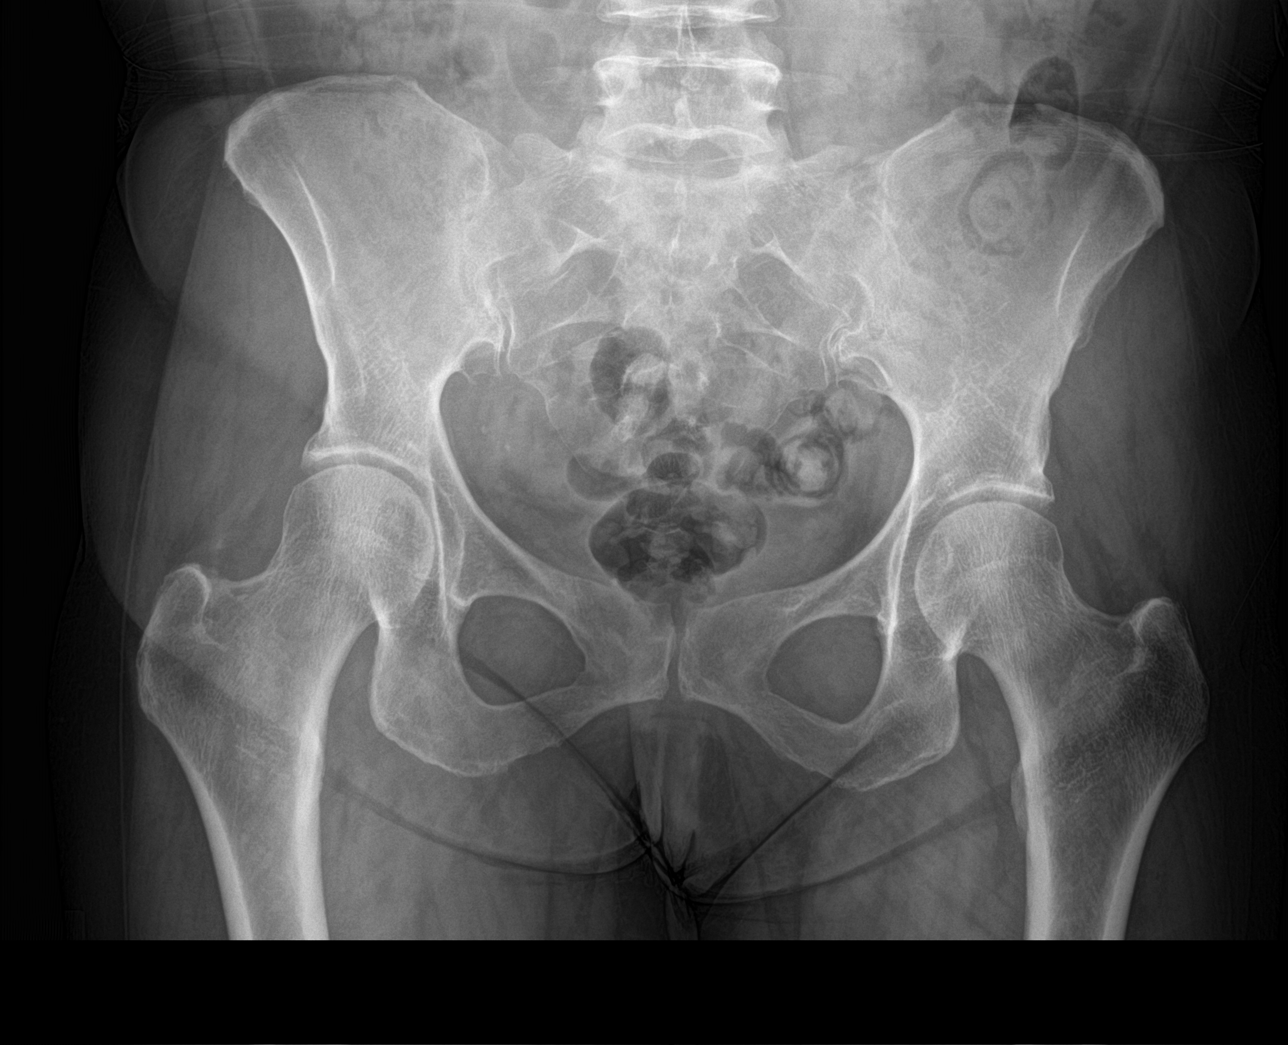

[hip ap]
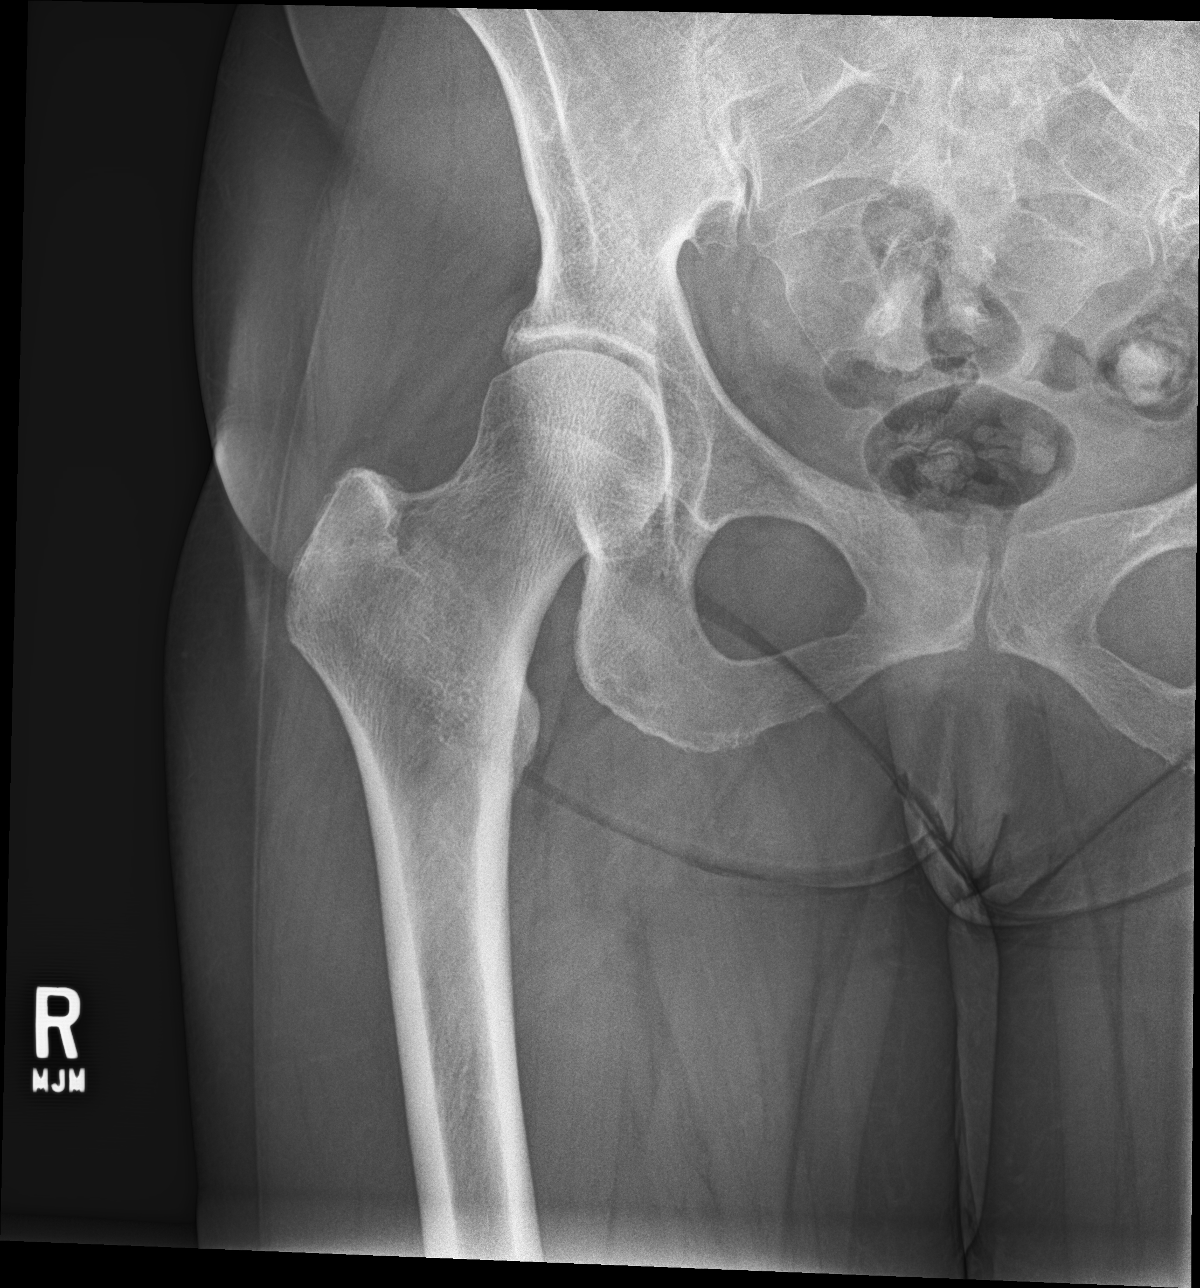

[hip lat]
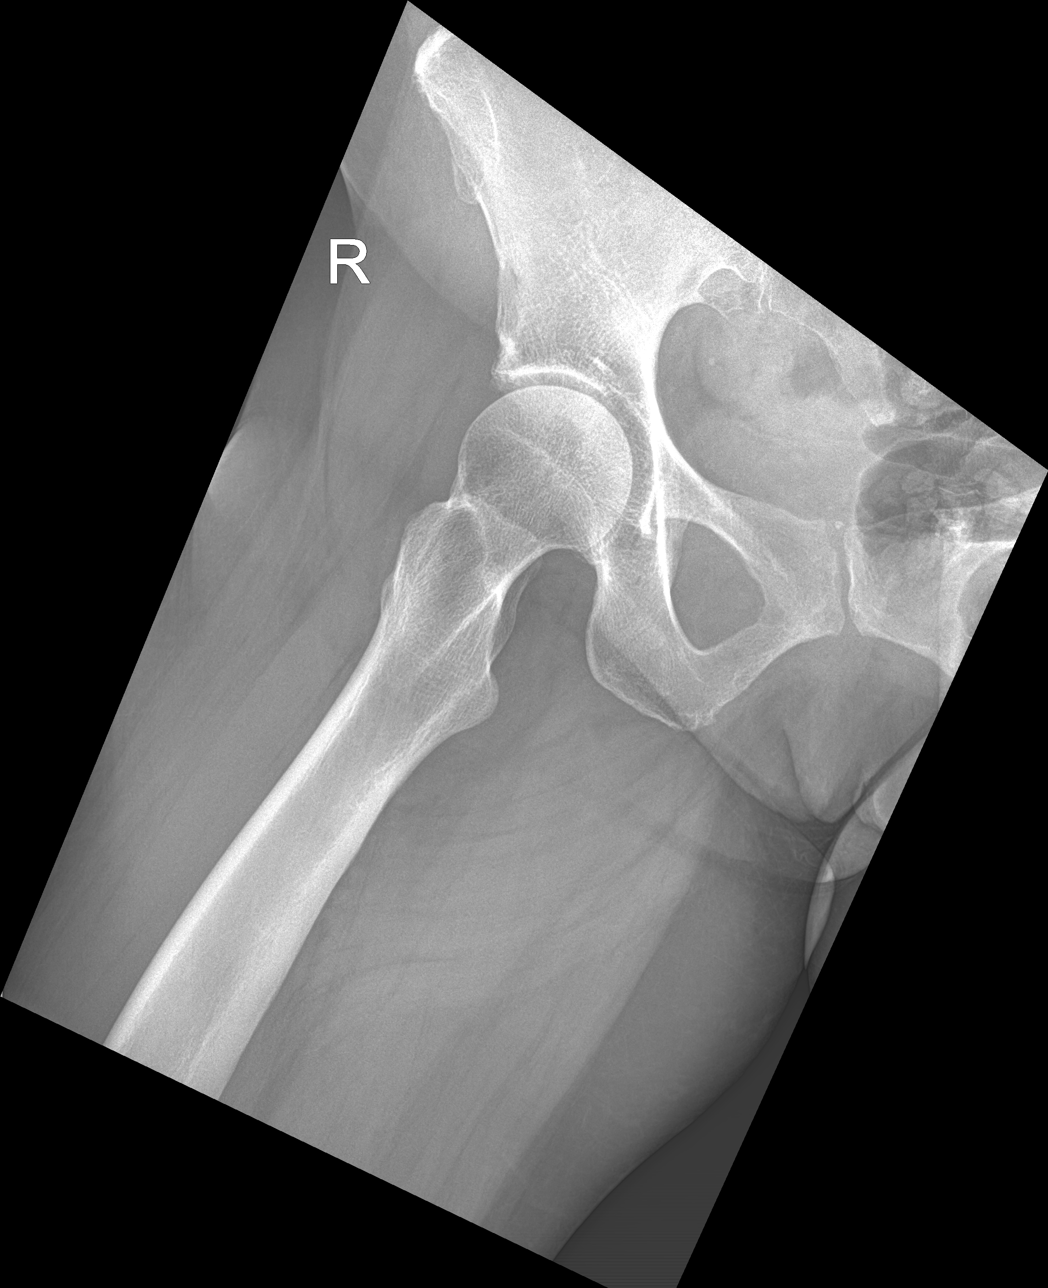

[3 of 3 positions shown; findings below may reference images not displayed]

FINDINGS: There is no evidence of hip fracture or dislocation. There is no
evidence of arthropathy or other focal bone abnormality.
IMPRESSION: Negative.

## 2018-08-01 ENCOUNTER — Encounter: Payer: Self-pay | Admitting: Rheumatology

## 2018-08-01 ENCOUNTER — Ambulatory Visit (INDEPENDENT_AMBULATORY_CARE_PROVIDER_SITE_OTHER): Payer: Medicare Other | Admitting: Rheumatology

## 2018-08-01 VITALS — BP 154/94 | HR 84 | Resp 15 | Ht 67.0 in | Wt 167.4 lb

## 2018-08-01 DIAGNOSIS — M8580 Other specified disorders of bone density and structure, unspecified site: Secondary | ICD-10-CM

## 2018-08-01 DIAGNOSIS — F5104 Psychophysiologic insomnia: Secondary | ICD-10-CM | POA: Diagnosis not present

## 2018-08-01 DIAGNOSIS — F419 Anxiety disorder, unspecified: Secondary | ICD-10-CM

## 2018-08-01 DIAGNOSIS — I495 Sick sinus syndrome: Secondary | ICD-10-CM

## 2018-08-01 DIAGNOSIS — M542 Cervicalgia: Secondary | ICD-10-CM

## 2018-08-01 DIAGNOSIS — K5730 Diverticulosis of large intestine without perforation or abscess without bleeding: Secondary | ICD-10-CM

## 2018-08-01 DIAGNOSIS — I10 Essential (primary) hypertension: Secondary | ICD-10-CM

## 2018-08-01 DIAGNOSIS — F32A Depression, unspecified: Secondary | ICD-10-CM

## 2018-08-01 DIAGNOSIS — F329 Major depressive disorder, single episode, unspecified: Secondary | ICD-10-CM

## 2018-08-01 DIAGNOSIS — M797 Fibromyalgia: Secondary | ICD-10-CM | POA: Diagnosis not present

## 2018-08-01 DIAGNOSIS — Z8639 Personal history of other endocrine, nutritional and metabolic disease: Secondary | ICD-10-CM

## 2018-08-01 DIAGNOSIS — Z8709 Personal history of other diseases of the respiratory system: Secondary | ICD-10-CM

## 2018-08-01 DIAGNOSIS — Z8719 Personal history of other diseases of the digestive system: Secondary | ICD-10-CM

## 2018-08-01 DIAGNOSIS — E559 Vitamin D deficiency, unspecified: Secondary | ICD-10-CM

## 2018-08-01 DIAGNOSIS — M51369 Other intervertebral disc degeneration, lumbar region without mention of lumbar back pain or lower extremity pain: Secondary | ICD-10-CM

## 2018-08-01 DIAGNOSIS — M5136 Other intervertebral disc degeneration, lumbar region: Secondary | ICD-10-CM | POA: Diagnosis not present

## 2018-08-01 DIAGNOSIS — L409 Psoriasis, unspecified: Secondary | ICD-10-CM

## 2018-08-01 MED ORDER — TRIAMCINOLONE ACETONIDE 40 MG/ML IJ SUSP
10.0000 mg | INTRAMUSCULAR | Status: AC | PRN
  Administered 2018-08-01: 10 mg via INTRAMUSCULAR

## 2018-08-01 MED ORDER — LIDOCAINE HCL 1 % IJ SOLN
0.5000 mL | INTRAMUSCULAR | Status: AC | PRN
  Administered 2018-08-01: .5 mL

## 2018-08-02 ENCOUNTER — Other Ambulatory Visit: Payer: Self-pay | Admitting: Otolaryngology

## 2018-08-02 DIAGNOSIS — J029 Acute pharyngitis, unspecified: Secondary | ICD-10-CM

## 2018-08-08 ENCOUNTER — Ambulatory Visit (INDEPENDENT_AMBULATORY_CARE_PROVIDER_SITE_OTHER): Payer: Medicare Other | Admitting: Psychiatry

## 2018-08-08 DIAGNOSIS — F331 Major depressive disorder, recurrent, moderate: Secondary | ICD-10-CM | POA: Diagnosis not present

## 2018-08-24 ENCOUNTER — Encounter: Payer: Medicare Other | Admitting: *Deleted

## 2018-08-24 ENCOUNTER — Other Ambulatory Visit: Payer: Self-pay

## 2018-08-27 ENCOUNTER — Telehealth: Payer: Self-pay

## 2018-08-27 NOTE — Telephone Encounter (Signed)
Unable to speak  with patient to remind of missed remote transmission 

## 2018-08-31 ENCOUNTER — Encounter: Payer: Self-pay | Admitting: Cardiology

## 2018-09-10 ENCOUNTER — Ambulatory Visit (INDEPENDENT_AMBULATORY_CARE_PROVIDER_SITE_OTHER): Payer: Medicare Other | Admitting: *Deleted

## 2018-09-10 ENCOUNTER — Other Ambulatory Visit: Payer: Self-pay

## 2018-09-10 DIAGNOSIS — I495 Sick sinus syndrome: Secondary | ICD-10-CM

## 2018-09-10 LAB — CUP PACEART REMOTE DEVICE CHECK
Battery Remaining Longevity: 73 mo
Battery Voltage: 3 V
Brady Statistic AP VP Percent: 0.08 %
Brady Statistic AP VS Percent: 99.68 %
Brady Statistic AS VP Percent: 0 %
Brady Statistic AS VS Percent: 0.24 %
Brady Statistic RA Percent Paced: 99.57 %
Brady Statistic RV Percent Paced: 0.08 %
Date Time Interrogation Session: 20200406180703
Implantable Lead Implant Date: 20160202
Implantable Lead Implant Date: 20160202
Implantable Lead Location: 753859
Implantable Lead Location: 753860
Implantable Lead Model: 5076
Implantable Lead Model: 5076
Implantable Pulse Generator Implant Date: 20160202
Lead Channel Impedance Value: 437 Ohm
Lead Channel Impedance Value: 437 Ohm
Lead Channel Impedance Value: 456 Ohm
Lead Channel Impedance Value: 532 Ohm
Lead Channel Pacing Threshold Amplitude: 0.375 V
Lead Channel Pacing Threshold Amplitude: 1 V
Lead Channel Pacing Threshold Pulse Width: 0.4 ms
Lead Channel Pacing Threshold Pulse Width: 0.4 ms
Lead Channel Sensing Intrinsic Amplitude: 2.875 mV
Lead Channel Sensing Intrinsic Amplitude: 2.875 mV
Lead Channel Sensing Intrinsic Amplitude: 23.625 mV
Lead Channel Sensing Intrinsic Amplitude: 23.625 mV
Lead Channel Setting Pacing Amplitude: 2 V
Lead Channel Setting Pacing Amplitude: 2.5 V
Lead Channel Setting Pacing Pulse Width: 0.4 ms
Lead Channel Setting Sensing Sensitivity: 2.8 mV

## 2018-09-19 ENCOUNTER — Encounter: Payer: Self-pay | Admitting: Cardiology

## 2018-09-19 NOTE — Progress Notes (Signed)
Remote pacemaker transmission.   

## 2018-12-10 ENCOUNTER — Encounter: Payer: Medicare Other | Admitting: *Deleted

## 2018-12-11 ENCOUNTER — Telehealth: Payer: Self-pay

## 2018-12-11 NOTE — Telephone Encounter (Signed)
Left message for patient to remind of missed remote transmission.  

## 2018-12-20 ENCOUNTER — Encounter: Payer: Self-pay | Admitting: Cardiology

## 2018-12-21 ENCOUNTER — Ambulatory Visit (INDEPENDENT_AMBULATORY_CARE_PROVIDER_SITE_OTHER): Payer: Medicare Other | Admitting: *Deleted

## 2018-12-21 DIAGNOSIS — I495 Sick sinus syndrome: Secondary | ICD-10-CM

## 2018-12-24 LAB — CUP PACEART REMOTE DEVICE CHECK
Battery Remaining Longevity: 65 mo
Battery Voltage: 3 V
Brady Statistic AP VP Percent: 0.07 %
Brady Statistic AP VS Percent: 99.77 %
Brady Statistic AS VP Percent: 0 %
Brady Statistic AS VS Percent: 0.16 %
Brady Statistic RA Percent Paced: 99.76 %
Brady Statistic RV Percent Paced: 0.07 %
Date Time Interrogation Session: 20200718204449
Implantable Lead Implant Date: 20160202
Implantable Lead Implant Date: 20160202
Implantable Lead Location: 753859
Implantable Lead Location: 753860
Implantable Lead Model: 5076
Implantable Lead Model: 5076
Implantable Pulse Generator Implant Date: 20160202
Lead Channel Impedance Value: 437 Ohm
Lead Channel Impedance Value: 437 Ohm
Lead Channel Impedance Value: 475 Ohm
Lead Channel Impedance Value: 551 Ohm
Lead Channel Pacing Threshold Amplitude: 0.375 V
Lead Channel Pacing Threshold Amplitude: 0.875 V
Lead Channel Pacing Threshold Pulse Width: 0.4 ms
Lead Channel Pacing Threshold Pulse Width: 0.4 ms
Lead Channel Sensing Intrinsic Amplitude: 20.5 mV
Lead Channel Sensing Intrinsic Amplitude: 20.5 mV
Lead Channel Sensing Intrinsic Amplitude: 3.875 mV
Lead Channel Sensing Intrinsic Amplitude: 3.875 mV
Lead Channel Setting Pacing Amplitude: 2 V
Lead Channel Setting Pacing Amplitude: 2.5 V
Lead Channel Setting Pacing Pulse Width: 0.4 ms
Lead Channel Setting Sensing Sensitivity: 2.8 mV

## 2018-12-26 ENCOUNTER — Encounter: Payer: Self-pay | Admitting: Cardiology

## 2018-12-26 NOTE — Progress Notes (Signed)
Remote pacemaker transmission.   

## 2019-01-16 NOTE — Progress Notes (Deleted)
Office Visit Note  Patient: [name]             Date of Birth: [birth year]           MRN: [id number]             PCP: [name], [place] Referring: [name], [place] Visit Date: 01/30/2019 Occupation: @GUAROCC @  Subjective:  No chief complaint on file.   History of Present Illness: [name] is a 63 y.o. female ***   Activities of Daily Living:  Patient reports morning stiffness for *** {minute/hour:19697}.   Patient {ACTIONS;DENIES/REPORTS:21021675::"Denies"} nocturnal pain.  Difficulty dressing/grooming: {ACTIONS;DENIES/REPORTS:21021675::"Denies"} Difficulty climbing stairs: {ACTIONS;DENIES/REPORTS:21021675::"Denies"} Difficulty getting out of chair: {ACTIONS;DENIES/REPORTS:21021675::"Denies"} Difficulty using hands for taps, buttons, cutlery, and/or writing: {ACTIONS;DENIES/REPORTS:21021675::"Denies"}  No Rheumatology ROS completed.   PMFS History:  Patient Active Problem List   Diagnosis Date Noted  . Aortic atherosclerosis ([place]) 10/18/2017  . Acute coronary syndrome ([place]) 02/09/2017  . Right knee pain 07/04/2016  . Pain in right ankle and joints of right foot 07/04/2016  . Psoriasis 03/10/2015  . Right foot pain 11/13/2014  . Ethmoid sinusitis 11/13/2014  . Abdominal pain, epigastric 11/13/2014  . Vitamin D deficiency 11/13/2014  . 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 Sick sinus syndrome ([place]) 07/08/2014  . Chest pain at rest 07/05/2014  . Chronic rhinitis 10/22/2013  . Fatty liver 10/22/2013  . Interstitial cystitis 10/22/2013  . Hip pain, right 10/22/2013  . Chronic insomnia 09/25/2012  . HTN (hypertension) 09/25/2012  . Hyperlipidemia 09/25/2012  . Diabetes mellitus type 2, controlled, without complications ([place]) 82/99/3716  . GERD 12/04/2008  . History of colonic polyps 12/04/2008  . CONSTIPATION 10/21/2008  . Fibromyalgia 10/21/2008  . LACTOSE INTOLERANCE 11/20/2007  . Chest pain, precordial 11/20/2007  . Anxiety and depression 08/02/2007  .  Osteoporosis 08/02/2007  . Diverticulosis of large intestine 11/16/2006  . EXTERNAL HEMORRHOIDS 05/12/2005    Past Medical History:  Diagnosis Date  . Allergy   . Anal fissure 02/2012  . Anxiety   . Arthritis   . Asthma   . Bursitis    right hip  . Complication of anesthesia     woke up during a colonscopy, difficult to wake up post-op sometimes , requires lots of blankets to stay warm post op  . Depression   . Diabetes mellitus without complication ([place])    type 2 diet controlled  . Fatty liver    sees dr Collene Mares for  . Fibromyalgia   . Gallstones 02/15/2015  . GERD (gastroesophageal reflux disease)   . Hepatitis A infection 1979  . Hyperlipidemia   . Hypertension   . Irritable bowel syndrome (IBS)   . 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 Psoriasis    left foot  . 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 Tarsal tunnel syndrome of left side     Family History  Problem Relation Age of Onset  . Cancer Mother        liver  . Diabetes Mother   . Hyperlipidemia Mother   . Hypertension Mother   . Stroke Mother   . Cancer Maternal Grandmother        breast  . Anesthesia problems Sister        hard to wake up post-op  . Heart disease Father   . Hyperlipidemia Brother   . HIV/AIDS Brother   . Cancer Maternal Grandfather  colon  . Diabetes Brother   . Cancer Brother        lung  . Cancer Paternal Grandfather        COLON   Past Surgical History:  Procedure Laterality Date  . ABDOMINAL HYSTERECTOMY  09/1994   partial  . APPENDECTOMY    . BILATERAL SALPINGOOPHORECTOMY Bilateral 04/2011  . CARDIAC CATHETERIZATION  10/08/2007  . CESAREAN SECTION     x 1  . CHOLECYSTECTOMY N/A 02/16/2015   Procedure: LAPAROSCOPIC CHOLECYSTECTOMY WITH INTRAOPERATIVE CHOLANGIOGRAM;  Surgeon: Johnathan Hausen, MD;  [facility];  Service: General;  Laterality: N/A;  . COLONOSCOPY WITH PROPOFOL N/A  08/19/2016   Procedure: COLONOSCOPY WITH PROPOFOL;  Surgeon: Carol Ada, MD;  [facility];  Service: Endoscopy;  Laterality: N/A;  . CYSTOSCOPY W/ URETERAL STENT PLACEMENT  08/04/2009  . EXAMINATION UNDER ANESTHESIA  02/17/2012   Procedure: EXAM UNDER ANESTHESIA;  Surgeon: Pedro Earls, MD;  Location: [facility];  Service: General;  Laterality: N/A;  Exam under anesthesia with lateral internal sphincterotomy  . LAPAROSCOPIC LYSIS INTESTINAL ADHESIONS  08/04/2009  . LEFT HEART CATH AND CORONARY ANGIOGRAPHY N/A 02/10/2017   Procedure: LEFT HEART CATH AND CORONARY ANGIOGRAPHY;  Surgeon: Dixie Dials, MD;  Location: [facility];  Service: Cardiovascular;  Laterality: N/A;  . PERMANENT PACEMAKER INSERTION N/A 07/08/2014   MDT MRI compatible dual chamber pacemaker implanted by Dr Rayann Heman for symptomatic bradycardia  . SHOULDER ARTHROSCOPY W/ ROTATOR CUFF REPAIR Left 1999  . SMALL INTESTINE SURGERY    . SPHINCTEROTOMY  02/17/2012   Procedure: SPHINCTEROTOMY;  Surgeon: Pedro Earls, MD;  Location: [facility];  Service: General;  Laterality: N/A;   Social History   Social History Narrative   Lives alone.  Daughter and granddaughter live in [place], [place]. Pt exercise sometimes.      Immunization History  Administered Date(s) Administered  . Influenza,inj,Quad PF,6+ Mos 03/10/2015, 04/12/2016, 03/24/2017  . Influenza-Unspecified 03/17/2014  . Pneumococcal Polysaccharide-23 03/10/2015  . Td 02/16/2012  . Tdap 02/16/2012     Objective: Vital Signs: There were no vitals taken for this visit.   Physical Exam   Musculoskeletal Exam: ***  CDAI Exam: CDAI Score: - Patient Global: -; Provider Global: - Swollen: -; Tender: - Joint Exam   No joint exam has been documented for this visit   There is currently no information documented on the homunculus. Go to the Rheumatology activity and complete the homunculus joint exam.  Investigation: No  additional findings.  Imaging: No results found.  Recent Labs: Lab Results  Component Value Date   WBC 5.9 10/18/2017   HGB 12.5 10/18/2017   PLT 296 10/18/2017   NA 143 10/18/2017   K 4.4 10/18/2017   CL 102 10/18/2017   CO2 25 10/18/2017   GLUCOSE 99 10/18/2017   BUN 8 10/18/2017   CREATININE 0.73 10/18/2017   BILITOT 0.7 10/18/2017   ALKPHOS 55 10/18/2017   AST 32 10/18/2017   ALT 54 (H) 10/18/2017   PROT 7.4 10/18/2017   ALBUMIN 4.6 10/18/2017   CALCIUM 9.5 10/18/2017   GFRAA 102 10/18/2017    Speciality Comments: No specialty comments available.  Procedures:  No procedures performed Allergies: Codeine, Propoxyphene n-acetaminophen, Sulfonamide derivatives, Alka-seltzer [aspirin effervescent], Norvasc [amlodipine besylate], Red dye, Singulair [montelukast sodium], Bupropion, Iohexol, and Latex   Assessment / Plan:     Visit Diagnoses: No diagnosis found.  Orders: No orders of the defined types were placed in this encounter.  No orders of the defined types were placed in this encounter.   Face-to-face time spent with patient was *** minutes. Greater than 50% of time was spent in counseling and coordination of care.  Follow-Up Instructions: No follow-ups on file.   Earnestine Mealing, CMA  Note - This record has been created using Editor, commissioning.  Chart creation errors have been sought, but may not always  have been located. Such creation errors do not reflect on  the standard of medical care.

## 2019-01-30 ENCOUNTER — Ambulatory Visit: Payer: Self-pay | Admitting: Rheumatology

## 2019-01-30 NOTE — Progress Notes (Deleted)
Office Visit Note  Patient: [name]             Date of Birth: 1955-08-10           MRN: [id number]             PCP: [name], [place] Referring: [name], [place] Visit Date: 02/13/2019 Occupation: @GUAROCC @  Subjective:  No chief complaint on file.   History of Present Illness: [name] is a 63 y.o. female ***   Activities of Daily Living:  Patient reports morning stiffness for *** {minute/hour:19697}.   Patient {ACTIONS;DENIES/REPORTS:21021675::"Denies"} nocturnal pain.  Difficulty dressing/grooming: {ACTIONS;DENIES/REPORTS:21021675::"Denies"} Difficulty climbing stairs: {ACTIONS;DENIES/REPORTS:21021675::"Denies"} Difficulty getting out of chair: {ACTIONS;DENIES/REPORTS:21021675::"Denies"} Difficulty using hands for taps, buttons, cutlery, and/or writing: {ACTIONS;DENIES/REPORTS:21021675::"Denies"}  No Rheumatology ROS completed.   PMFS History:  Patient Active Problem List   Diagnosis Date Noted  . Aortic atherosclerosis ([place]) 10/18/2017  . Acute coronary syndrome ([place]) 02/09/2017  . Right knee pain 07/04/2016  . Pain in right ankle and joints of right foot 07/04/2016  . Psoriasis 03/10/2015  . Right foot pain 11/13/2014  . Ethmoid sinusitis 11/13/2014  . Abdominal pain, epigastric 11/13/2014  . Vitamin D deficiency 11/13/2014  . 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 Sick sinus syndrome ([place]) 07/08/2014  . Chest pain at rest 07/05/2014  . Chronic rhinitis 10/22/2013  . Fatty liver 10/22/2013  . Interstitial cystitis 10/22/2013  . Hip pain, right 10/22/2013  . Chronic insomnia 09/25/2012  . HTN (hypertension) 09/25/2012  . Hyperlipidemia 09/25/2012  . Diabetes mellitus type 2, controlled, without complications ([place]) 92/33/0076  . GERD 12/04/2008  . History of colonic polyps 12/04/2008  . CONSTIPATION 10/21/2008  . Fibromyalgia 10/21/2008  . LACTOSE INTOLERANCE 11/20/2007  . Chest pain, precordial 11/20/2007  . Anxiety and depression 08/02/2007  .  Osteoporosis 08/02/2007  . Diverticulosis of large intestine 11/16/2006  . EXTERNAL HEMORRHOIDS 05/12/2005    Past Medical History:  Diagnosis Date  . Allergy   . Anal fissure 02/2012  . Anxiety   . Arthritis   . Asthma   . Bursitis    right hip  . Complication of anesthesia     woke up during a colonscopy, difficult to wake up post-op sometimes , requires lots of blankets to stay warm post op  . Depression   . Diabetes mellitus without complication ([place])    type 2 diet controlled  . Fatty liver    sees dr Collene Mares for  . Fibromyalgia   . Gallstones 02/15/2015  . GERD (gastroesophageal reflux disease)   . Hepatitis A infection 1979  . Hyperlipidemia   . Hypertension   . Irritable bowel syndrome (IBS)   . 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 Psoriasis    left foot  . 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 Tarsal tunnel syndrome of left side     Family History  Problem Relation Age of Onset  . Cancer Mother        liver  . Diabetes Mother   . Hyperlipidemia Mother   . Hypertension Mother   . Stroke Mother   . Cancer Maternal Grandmother        breast  . Anesthesia problems Sister        hard to wake up post-op  . Heart disease Father   . Hyperlipidemia Brother   . HIV/AIDS Brother   . Cancer Maternal Grandfather  colon  . Diabetes Brother   . Cancer Brother        lung  . Cancer Paternal Grandfather        COLON   Past Surgical History:  Procedure Laterality Date  . ABDOMINAL HYSTERECTOMY  09/1994   partial  . APPENDECTOMY    . BILATERAL SALPINGOOPHORECTOMY Bilateral 04/2011  . CARDIAC CATHETERIZATION  10/08/2007  . CESAREAN SECTION     x 1  . CHOLECYSTECTOMY N/A 02/16/2015   Procedure: LAPAROSCOPIC CHOLECYSTECTOMY WITH INTRAOPERATIVE CHOLANGIOGRAM;  Surgeon: Johnathan Hausen, MD;  Location: [facility];  Service: General;  Laterality: N/A;  . COLONOSCOPY WITH PROPOFOL N/A  08/19/2016   Procedure: COLONOSCOPY WITH PROPOFOL;  Surgeon: Carol Ada, MD;  Location: [facility];  Service: Endoscopy;  Laterality: N/A;  . CYSTOSCOPY W/ URETERAL STENT PLACEMENT  08/04/2009  . EXAMINATION UNDER ANESTHESIA  02/17/2012   Procedure: EXAM UNDER ANESTHESIA;  Surgeon: Pedro Earls, MD;  Location: [facility];  Service: General;  Laterality: N/A;  Exam under anesthesia with lateral internal sphincterotomy  . LAPAROSCOPIC LYSIS INTESTINAL ADHESIONS  08/04/2009  . LEFT HEART CATH AND CORONARY ANGIOGRAPHY N/A 02/10/2017   Procedure: LEFT HEART CATH AND CORONARY ANGIOGRAPHY;  Surgeon: Dixie Dials, MD;  Location: [facility];  Service: Cardiovascular;  Laterality: N/A;  . PERMANENT PACEMAKER INSERTION N/A 07/08/2014   MDT MRI compatible dual chamber pacemaker implanted by Dr Rayann Heman for symptomatic bradycardia  . SHOULDER ARTHROSCOPY W/ ROTATOR CUFF REPAIR Left 1999  . SMALL INTESTINE SURGERY    . SPHINCTEROTOMY  02/17/2012   Procedure: SPHINCTEROTOMY;  Surgeon: Pedro Earls, MD;  Location: [facility];  Service: General;  Laterality: N/A;   Social History   Social History Narrative   Lives alone.  Daughter and granddaughter live in [place], [place]. Pt exercise sometimes.      Immunization History  Administered Date(s) Administered  . Influenza,inj,Quad PF,6+ Mos 03/10/2015, 04/12/2016, 03/24/2017  . Influenza-Unspecified 03/17/2014  . Pneumococcal Polysaccharide-23 03/10/2015  . Td 02/16/2012  . Tdap 02/16/2012     Objective: Vital Signs: There were no vitals taken for this visit.   Physical Exam   Musculoskeletal Exam: ***  CDAI Exam: CDAI Score: - Patient Global: -; Provider Global: - Swollen: -; Tender: - Joint Exam   No joint exam has been documented for this visit   There is currently no information documented on the homunculus. Go to the Rheumatology activity and complete the homunculus joint exam.  Investigation: No  additional findings.  Imaging: No results found.  Recent Labs: Lab Results  Component Value Date   WBC 5.9 10/18/2017   HGB 12.5 10/18/2017   PLT 296 10/18/2017   NA 143 10/18/2017   K 4.4 10/18/2017   CL 102 10/18/2017   CO2 25 10/18/2017   GLUCOSE 99 10/18/2017   BUN 8 10/18/2017   CREATININE 0.73 10/18/2017   BILITOT 0.7 10/18/2017   ALKPHOS 55 10/18/2017   AST 32 10/18/2017   ALT 54 (H) 10/18/2017   PROT 7.4 10/18/2017   ALBUMIN 4.6 10/18/2017   CALCIUM 9.5 10/18/2017   GFRAA 102 10/18/2017    Speciality Comments: No specialty comments available.  Procedures:  No procedures performed Allergies: Codeine, Propoxyphene n-acetaminophen, Sulfonamide derivatives, Alka-seltzer [aspirin effervescent], Norvasc [amlodipine besylate], Red dye, Singulair [montelukast sodium], Bupropion, Iohexol, and Latex   Assessment / Plan:     Visit Diagnoses: No diagnosis found.  Orders: No orders of the defined types were placed in this encounter.  No orders of the defined types were placed in this encounter.   Face-to-face time spent with patient was *** minutes. Greater than 50% of time was spent in counseling and coordination of care.  Follow-Up Instructions: No follow-ups on file.   Earnestine Mealing, CMA  Note - This record has been created using Editor, commissioning.  Chart creation errors have been sought, but may not always  have been located. Such creation errors do not reflect on  the standard of medical care.

## 2019-02-13 ENCOUNTER — Ambulatory Visit: Payer: Medicare Other | Admitting: Rheumatology

## 2019-03-22 ENCOUNTER — Encounter: Payer: Medicare Other | Admitting: *Deleted

## 2019-03-27 ENCOUNTER — Ambulatory Visit (INDEPENDENT_AMBULATORY_CARE_PROVIDER_SITE_OTHER): Payer: Medicare Other | Admitting: *Deleted

## 2019-03-27 DIAGNOSIS — I495 Sick sinus syndrome: Secondary | ICD-10-CM | POA: Diagnosis not present

## 2019-03-28 LAB — CUP PACEART REMOTE DEVICE CHECK
Battery Remaining Longevity: 66 mo
Battery Voltage: 3 V
Brady Statistic AP VP Percent: 0.05 %
Brady Statistic AP VS Percent: 99.02 %
Brady Statistic AS VP Percent: 0 %
Brady Statistic AS VS Percent: 0.93 %
Brady Statistic RA Percent Paced: 99.04 %
Brady Statistic RV Percent Paced: 0.06 %
Date Time Interrogation Session: 20201021184547
Implantable Lead Implant Date: 20160202
Implantable Lead Implant Date: 20160202
Implantable Lead Location: 753859
Implantable Lead Location: 753860
Implantable Lead Model: 5076
Implantable Lead Model: 5076
Implantable Pulse Generator Implant Date: 20160202
Lead Channel Impedance Value: 399 Ohm
Lead Channel Impedance Value: 418 Ohm
Lead Channel Impedance Value: 475 Ohm
Lead Channel Impedance Value: 513 Ohm
Lead Channel Pacing Threshold Amplitude: 0.375 V
Lead Channel Pacing Threshold Amplitude: 1 V
Lead Channel Pacing Threshold Pulse Width: 0.4 ms
Lead Channel Pacing Threshold Pulse Width: 0.4 ms
Lead Channel Sensing Intrinsic Amplitude: 17.875 mV
Lead Channel Sensing Intrinsic Amplitude: 17.875 mV
Lead Channel Sensing Intrinsic Amplitude: 3.375 mV
Lead Channel Sensing Intrinsic Amplitude: 3.375 mV
Lead Channel Setting Pacing Amplitude: 2 V
Lead Channel Setting Pacing Amplitude: 2.5 V
Lead Channel Setting Pacing Pulse Width: 0.4 ms
Lead Channel Setting Sensing Sensitivity: 2.8 mV

## 2019-03-28 NOTE — Progress Notes (Signed)
Office Visit Note  Patient: [name]             Date of Birth: [birth year]           MRN: [id number]             PCP: [name], PA Referring: [name], [place] Visit Date: 03/29/2019 Occupation: @GUAROCC @  Subjective:  Trapezius muscle spasms   History of Present Illness: [name] is a 63 y.o. female with history fibromyalgia and DDD.  Patient presents today with generalized muscle aches and muscle tenderness due to fibromyalgia.  She is having trapezius muscle tension and muscle tenderness bilaterally.  She is been experiencing muscle spasms in her trapezius intermittently. She requested bilateral trigger point injections today.  She continues to take Percocet and Flexeril as needed and uses a heating pad and topical agents for pain relief.  She states that she is currently undergoing physical therapy for fibromyalgia.  She has been doing virtual visit with her physical therapist once a week.  She experiences occasional lower back pain but denies any symptoms of radiculopathy.  She is also been experiencing increased right knee joint pain for the past 6 months.  She denies any joint swelling currently.   Activities of Daily Living:  Patient reports morning stiffness for several hours.   Patient Reports nocturnal pain.  Difficulty dressing/grooming: Reports Difficulty climbing stairs: Denies Difficulty getting out of chair: Denies Difficulty using hands for taps, buttons, cutlery, and/or writing: Denies  Review of Systems  Constitutional: Positive for fatigue.  HENT: Positive for mouth dryness. Negative for mouth sores and nose dryness.   Eyes: Negative for itching and dryness.  Respiratory: Negative for shortness of breath, wheezing and difficulty breathing.   Cardiovascular: Negative for chest pain and palpitations.  Gastrointestinal: Negative for blood in stool, constipation and diarrhea.  Endocrine: Negative for increased urination.  Genitourinary: Negative for  difficulty urinating and painful urination.  Musculoskeletal: Positive for arthralgias, joint pain, joint swelling and morning stiffness.  Skin: Positive for rash.  Allergic/Immunologic: Negative for susceptible to infections.  Neurological: Positive for numbness and weakness. Negative for dizziness, light-headedness, headaches and memory loss.  Hematological: Negative for swollen glands.  Psychiatric/Behavioral: Positive for sleep disturbance. Negative for confusion.    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   Sick sinus syndrome ([place]) 07/08/2014   Chest pain at rest 07/05/2014   Chronic rhinitis 10/22/2013   Fatty liver 10/22/2013   Interstitial cystitis 10/22/2013   Hip pain, right 10/22/2013   Chronic insomnia 09/25/2012   HTN (hypertension) 09/25/2012   Hyperlipidemia 09/25/2012   Diabetes mellitus type 2, controlled, without complications ([place]) 70/96/2836   GERD 12/04/2008   History of colonic polyps 12/04/2008   CONSTIPATION 10/21/2008   Fibromyalgia 10/21/2008   LACTOSE INTOLERANCE 11/20/2007   Chest pain, precordial 11/20/2007   Anxiety and depression 08/02/2007   Osteoporosis 08/02/2007   Diverticulosis of large intestine 11/16/2006   EXTERNAL HEMORRHOIDS 05/12/2005    Past Medical History:  Diagnosis Date   Allergy    Anal fissure 02/2012   Anxiety    Arthritis  Asthma    Bursitis    right hip   Complication of anesthesia     woke up during a colonscopy, difficult to wake up post-op sometimes , requires lots of blankets to stay warm post  op   Depression    Diabetes mellitus without complication ([place])    type 2 diet controlled   Fatty liver    sees dr Collene Mares for   Fibromyalgia    Gallstones 02/15/2015   GERD (gastroesophageal reflux disease)    Hepatitis A infection 1979   Hyperlipidemia    Hypertension    Irritable bowel syndrome (IBS)    Presence of permanent cardiac pacemaker 07/2014   sees dr allred   Psoriasis    left foot   Symptomatic bradycardia    a. s/p MDT MRI compatible dual chamber pacemaker 07/2014 Dr Allred   Tarsal tunnel syndrome of left side     Family History  Problem Relation Age of Onset   Cancer Mother        liver   Diabetes Mother    Hyperlipidemia Mother    Hypertension Mother    Stroke Mother    Cancer Maternal Grandmother        breast   Anesthesia problems Sister        hard to wake up post-op   Heart disease Father    Hyperlipidemia Brother    HIV/AIDS Brother    Cancer Maternal Grandfather        colon   Diabetes Brother    Cancer Brother        lung   Cancer Paternal Grandfather        COLON   Past Surgical History:  Procedure Laterality Date   ABDOMINAL HYSTERECTOMY  09/1994   partial   APPENDECTOMY     BILATERAL SALPINGOOPHORECTOMY Bilateral 04/2011   CARDIAC CATHETERIZATION  10/08/2007   CESAREAN SECTION     x 1   CHOLECYSTECTOMY N/A 02/16/2015   Procedure: LAPAROSCOPIC CHOLECYSTECTOMY WITH INTRAOPERATIVE CHOLANGIOGRAM;  Surgeon: Johnathan Hausen, MD;  [facility];  Service: General;  Laterality: N/A;   COLONOSCOPY WITH PROPOFOL N/A 08/19/2016   Procedure: COLONOSCOPY WITH PROPOFOL;  Surgeon: Carol Ada, MD;  [facility];  Service: Endoscopy;  Laterality: N/A;   CYSTOSCOPY W/ URETERAL STENT PLACEMENT  08/04/2009   EXAMINATION UNDER ANESTHESIA  02/17/2012   Procedure: EXAM UNDER ANESTHESIA;  Surgeon: Pedro Earls, MD;  Location: [facility];  Service: General;  Laterality: N/A;  Exam under anesthesia  with lateral internal sphincterotomy   LAPAROSCOPIC LYSIS INTESTINAL ADHESIONS  08/04/2009   LEFT HEART CATH AND CORONARY ANGIOGRAPHY N/A 02/10/2017   Procedure: LEFT HEART CATH AND CORONARY ANGIOGRAPHY;  Surgeon: Dixie Dials, MD;  Location: [facility];  Service: Cardiovascular;  Laterality: N/A;   PERMANENT PACEMAKER INSERTION N/A 07/08/2014   MDT MRI compatible dual chamber pacemaker implanted by Dr Rayann Heman for symptomatic bradycardia   SHOULDER ARTHROSCOPY W/ ROTATOR CUFF REPAIR Left 1999   SMALL INTESTINE SURGERY     SPHINCTEROTOMY  02/17/2012   Procedure: SPHINCTEROTOMY;  Surgeon: Pedro Earls, MD;  Location: [facility];  Service: General;  Laterality: N/A;   Social History   Social History Narrative   Lives alone.  Daughter and granddaughter live in [place], [place]. Pt exercise sometimes.      Immunization History  Administered Date(s) Administered   Influenza,inj,Quad PF,6+ Mos 03/10/2015, 04/12/2016, 03/24/2017   Influenza-Unspecified 03/17/2014   Pneumococcal Polysaccharide-23 03/10/2015  Td 02/16/2012   Tdap 02/16/2012     Objective: Vital Signs: BP 133/85 (BP Location: Right Arm, Patient Position: Sitting, Cuff Size: Normal)    Pulse 61    Resp 14    Ht 5' 3"  (1.6 m)    Wt 170 lb (77.1 kg)    BMI 30.11 kg/m    Physical Exam Vitals signs and nursing note reviewed.  Constitutional:      Appearance: She is well-developed.  HENT:     Head: Normocephalic and atraumatic.  Eyes:     Conjunctiva/sclera: Conjunctivae normal.  Neck:     Musculoskeletal: Normal range of motion.  Cardiovascular:     Rate and Rhythm: Normal rate and regular rhythm.     Heart sounds: Normal heart sounds.  Pulmonary:     Effort: Pulmonary effort is normal.     Breath sounds: Normal breath sounds.  Abdominal:     General: Bowel sounds are normal.     Palpations: Abdomen is soft.  Lymphadenopathy:     Cervical: No cervical adenopathy.  Skin:    General:  Skin is warm and dry.     Capillary Refill: Capillary refill takes less than 2 seconds.  Neurological:     Mental Status: She is alert and oriented to person, place, and time.  Psychiatric:        Behavior: Behavior normal.      Musculoskeletal Exam: Generalized hyperalgesia and positive tender points on exam.  C-spine has limited range of motion with lateral rotation to the left.  She has trapezius muscle tension and muscle tenderness bilaterally.  Thoracic and lumbar spine have good range of motion.  Shoulder joints, elbow joints, wrist joints, MCPs, PIPs and DIPs good range of motion no synovitis.  She has complete fist formation bilaterally.  Hip joints, knee joints ankle joints, MTPs, PIPs and DIPs good range of motion no synovitis.  No warmth or effusion of bilateral knee joints.  No tenderness or swelling of ankle joints.  CDAI Exam: CDAI Score: -- Patient Global: --; Provider Global: -- Swollen: --; Tender: -- Joint Exam   No joint exam has been documented for this visit   There is currently no information documented on the homunculus. Go to the Rheumatology activity and complete the homunculus joint exam.  Investigation: No additional findings.  Imaging: No results found.  Recent Labs: Lab Results  Component Value Date   WBC 5.9 10/18/2017   HGB 12.5 10/18/2017   PLT 296 10/18/2017   NA 143 10/18/2017   K 4.4 10/18/2017   CL 102 10/18/2017   CO2 25 10/18/2017   GLUCOSE 99 10/18/2017   BUN 8 10/18/2017   CREATININE 0.73 10/18/2017   BILITOT 0.7 10/18/2017   ALKPHOS 55 10/18/2017   AST 32 10/18/2017   ALT 54 (H) 10/18/2017   PROT 7.4 10/18/2017   ALBUMIN 4.6 10/18/2017   CALCIUM 9.5 10/18/2017   GFRAA 102 10/18/2017    Speciality Comments: No specialty comments available.  Procedures:  Trigger Point Inj  Date/Time: 03/29/2019 10:23 AM Performed by: Ofilia Neas, PA-C Authorized by: Ofilia Neas, PA-C   Consent Given by:  Patient Site marked: the  procedure site was marked   Timeout: prior to procedure the correct patient, procedure, and site was verified   Indications:  Pain Total # of Trigger Points:  2 Location: neck   Needle Size:  27 G Approach:  Dorsal Medications #1:  0.5 mL lidocaine 1 %; 10 mg triamcinolone acetonide  40 MG/ML Medications #2:  0.5 mL lidocaine 1 %; 10 mg triamcinolone acetonide 40 MG/ML Patient tolerance:  Patient tolerated the procedure well with no immediate complications   Allergies: Codeine, Propoxyphene n-acetaminophen, Sulfonamide derivatives, Alka-seltzer [aspirin effervescent], Norvasc [amlodipine besylate], Red dye, Singulair [montelukast sodium], Bupropion, Iohexol, and Latex   Assessment / Plan:     Visit Diagnoses: Fibromyalgia: She has generalized hyperalgesia and positive tender points on exam.  She presents today with trapezius muscle spasms bilaterally.  She requested trigger point cortisone injections today.  She continues to take Percocet and Flexeril as needed for pain relief.  She takes Ambien 10 mg a mouth at bedtime to help with insomnia.  She continues to have chronic fatigue related to insomnia.  She is been undergoing physical therapy and has had virtual visits with her physical therapist once weekly to help with her fibromyalgia.  We discussed the importance of regular exercise and good sleep hygiene.  She will follow-up in the office in 6 months.  Trapezius muscle spasm: She presents today with trapezius muscle spasms bilaterally.  She is experiencing tenderness and tension.  She has limited range of motion of the C-spine without rotation to the left.  She requested trigger point injections bilaterally.  She tolerated the procedure well.  The procedure note was completed above.  Aftercare was discussed.  She has been taking Percocet and Flexeril as needed for pain relief.  She is also tried using a heating pad and topical agents for pain relief.  She has been undergoing physical therapy for  fibromyalgia.  She has virtual visits with her physical therapist once weekly.  Chronic insomnia: She has interrupted sleep at night due to discomfort she experiences.  She takes Ambien 10 mg by mouth at bedtime for insomnia.  DDD (degenerative disc disease), lumbar: She experiences intermittent lower back pain.  She is no symptoms of radiculopathy at this time.  No midline spinal tenderness  Psoriasis: She has no psoriasis at this time.  Osteopenia, unspecified location: DEXA at [facility] on 07/04/2018 AP spine BMD 0.920 with T score -2.1.  She is taking vitamin D supplement.  Vitamin D deficiency: She takes vitamin D 2000 units by mouth daily.  Other medical conditions are listed as follows:  Diverticulosis of large intestine without hemorrhage  Essential hypertension  History of hypercholesterolemia  Sick sinus syndrome (HCC)  History of IBS  History of asthma  Anxiety and depression  History of gastroesophageal reflux (GERD)  Orders: Orders Placed This Encounter  Procedures   Trigger Point Inj   No orders of the defined types were placed in this encounter.     Follow-Up Instructions: Return in about 6 months (around 09/27/2019) for Fibromyalgia.   Ofilia Neas, PA-C  Note - This record has been created using Dragon software.  Chart creation errors have been sought, but may not always  have been located. Such creation errors do not reflect on  the standard of medical care.

## 2019-03-29 ENCOUNTER — Other Ambulatory Visit: Payer: Self-pay

## 2019-03-29 ENCOUNTER — Ambulatory Visit (INDEPENDENT_AMBULATORY_CARE_PROVIDER_SITE_OTHER): Payer: Medicare Other | Admitting: Physician Assistant

## 2019-03-29 ENCOUNTER — Encounter: Payer: Self-pay | Admitting: Physician Assistant

## 2019-03-29 VITALS — BP 133/85 | HR 61 | Resp 14 | Ht 63.0 in | Wt 170.0 lb

## 2019-03-29 DIAGNOSIS — M5136 Other intervertebral disc degeneration, lumbar region: Secondary | ICD-10-CM

## 2019-03-29 DIAGNOSIS — M797 Fibromyalgia: Secondary | ICD-10-CM | POA: Diagnosis not present

## 2019-03-29 DIAGNOSIS — K5730 Diverticulosis of large intestine without perforation or abscess without bleeding: Secondary | ICD-10-CM

## 2019-03-29 DIAGNOSIS — I495 Sick sinus syndrome: Secondary | ICD-10-CM

## 2019-03-29 DIAGNOSIS — E559 Vitamin D deficiency, unspecified: Secondary | ICD-10-CM

## 2019-03-29 DIAGNOSIS — Z8639 Personal history of other endocrine, nutritional and metabolic disease: Secondary | ICD-10-CM

## 2019-03-29 DIAGNOSIS — Z8709 Personal history of other diseases of the respiratory system: Secondary | ICD-10-CM

## 2019-03-29 DIAGNOSIS — F32A Depression, unspecified: Secondary | ICD-10-CM

## 2019-03-29 DIAGNOSIS — I10 Essential (primary) hypertension: Secondary | ICD-10-CM

## 2019-03-29 DIAGNOSIS — L409 Psoriasis, unspecified: Secondary | ICD-10-CM

## 2019-03-29 DIAGNOSIS — F5104 Psychophysiologic insomnia: Secondary | ICD-10-CM | POA: Diagnosis not present

## 2019-03-29 DIAGNOSIS — M62838 Other muscle spasm: Secondary | ICD-10-CM

## 2019-03-29 DIAGNOSIS — F329 Major depressive disorder, single episode, unspecified: Secondary | ICD-10-CM

## 2019-03-29 DIAGNOSIS — M8580 Other specified disorders of bone density and structure, unspecified site: Secondary | ICD-10-CM

## 2019-03-29 DIAGNOSIS — F419 Anxiety disorder, unspecified: Secondary | ICD-10-CM

## 2019-03-29 DIAGNOSIS — Z8719 Personal history of other diseases of the digestive system: Secondary | ICD-10-CM

## 2019-03-29 MED ORDER — TRIAMCINOLONE ACETONIDE 40 MG/ML IJ SUSP
10.0000 mg | INTRAMUSCULAR | Status: AC | PRN
  Administered 2019-03-29: 10 mg via INTRAMUSCULAR

## 2019-03-29 MED ORDER — LIDOCAINE HCL 1 % IJ SOLN
0.5000 mL | INTRAMUSCULAR | Status: AC | PRN
  Administered 2019-03-29: .5 mL

## 2019-03-29 NOTE — Patient Instructions (Signed)
Journal for Nurse Practitioners, 15(4), [phone]. Retrieved March 12, 2018 from [email or web address]">  Knee Exercises Ask your health care provider which exercises are safe for you. Do exercises exactly as told by your health care provider and adjust them as directed. It is normal to feel mild stretching, pulling, tightness, or discomfort as you do these exercises. Stop right away if you feel sudden pain or your pain gets worse. Do not begin these exercises until told by your health care provider. Stretching and range-of-motion exercises These exercises warm up your muscles and joints and improve the movement and flexibility of your knee. These exercises also help to relieve pain and swelling. Knee extension, prone 1. Lie on your abdomen (prone position) on a bed. 2. Place your left / right knee just beyond the edge of the surface so your knee is not on the bed. You can put a towel under your left / right thigh just above your kneecap for comfort. 3. Relax your leg muscles and allow gravity to straighten your knee (extension). You should feel a stretch behind your left / right knee. 4. Hold this position for __________ seconds. 5. Scoot up so your knee is supported between repetitions. Repeat __________ times. Complete this exercise __________ times a day. Knee flexion, active  1. Lie on your back with both legs straight. If this causes back discomfort, bend your left / right knee so your foot is flat on the floor. 2. Slowly slide your left / right heel back toward your buttocks. Stop when you feel a gentle stretch in the front of your knee or thigh (flexion). 3. Hold this position for __________ seconds. 4. Slowly slide your left / right heel back to the starting position. Repeat __________ times. Complete this exercise __________ times a day. Quadriceps stretch, prone  1. Lie on your abdomen on a firm surface, such as a bed or padded floor. 2. Bend your left / right knee and hold  your ankle. If you cannot reach your ankle or pant leg, loop a belt around your foot and grab the belt instead. 3. Gently pull your heel toward your buttocks. Your knee should not slide out to the side. You should feel a stretch in the front of your thigh and knee (quadriceps). 4. Hold this position for __________ seconds. Repeat __________ times. Complete this exercise __________ times a day. Hamstring, supine 1. Lie on your back (supine position). 2. Loop a belt or towel over the ball of your left / right foot. The ball of your foot is on the walking surface, right under your toes. 3. Straighten your left / right knee and slowly pull on the belt to raise your leg until you feel a gentle stretch behind your knee (hamstring). ? Do not let your knee bend while you do this. ? Keep your other leg flat on the floor. 4. Hold this position for __________ seconds. Repeat __________ times. Complete this exercise __________ times a day. Strengthening exercises These exercises build strength and endurance in your knee. Endurance is the ability to use your muscles for a long time, even after they get tired. Quadriceps, isometric This exercise stretches the muscles in front of your thigh (quadriceps) without moving your knee joint (isometric). 1. Lie on your back with your left / right leg extended and your other knee bent. Put a rolled towel or small pillow under your knee if told by your health care provider. 2. Slowly tense the muscles in the front of your left /  right thigh. You should see your kneecap slide up toward your hip or see increased dimpling just above the knee. This motion will push the back of the knee toward the floor. 3. For __________ seconds, hold the muscle as tight as you can without increasing your pain. 4. Relax the muscles slowly and completely. Repeat __________ times. Complete this exercise __________ times a day. Straight leg raises This exercise stretches the muscles in front  of your thigh (quadriceps) and the muscles that move your hips (hip flexors). 1. Lie on your back with your left / right leg extended and your other knee bent. 2. Tense the muscles in the front of your left / right thigh. You should see your kneecap slide up or see increased dimpling just above the knee. Your thigh may even shake a bit. 3. Keep these muscles tight as you raise your leg 4-6 inches (10-15 cm) off the floor. Do not let your knee bend. 4. Hold this position for __________ seconds. 5. Keep these muscles tense as you lower your leg. 6. Relax your muscles slowly and completely after each repetition. Repeat __________ times. Complete this exercise __________ times a day. Hamstring, isometric 1. Lie on your back on a firm surface. 2. Bend your left / right knee about __________ degrees. 3. Dig your left / right heel into the surface as if you are trying to pull it toward your buttocks. Tighten the muscles in the back of your thighs (hamstring) to "dig" as hard as you can without increasing any pain. 4. Hold this position for __________ seconds. 5. Release the tension gradually and allow your muscles to relax completely for __________ seconds after each repetition. Repeat __________ times. Complete this exercise __________ times a day. Hamstring curls If told by your health care provider, do this exercise while wearing ankle weights. Begin with __________ lb weights. Then increase the weight by 1 lb (0.5 kg) increments. Do not wear ankle weights that are more than __________ lb. 1. Lie on your abdomen with your legs straight. 2. Bend your left / right knee as far as you can without feeling pain. Keep your hips flat against the floor. 3. Hold this position for __________ seconds. 4. Slowly lower your leg to the starting position. Repeat __________ times. Complete this exercise __________ times a day. Squats This exercise strengthens the muscles in front of your thigh and knee  (quadriceps). 1. Stand in front of a table, with your feet and knees pointing straight ahead. You may rest your hands on the table for balance but not for support. 2. Slowly bend your knees and lower your hips like you are going to sit in a chair. ? Keep your weight over your heels, not over your toes. ? Keep your lower legs upright so they are parallel with the table legs. ? Do not let your hips go lower than your knees. ? Do not bend lower than told by your health care provider. ? If your knee pain increases, do not bend as low. 3. Hold the squat position for __________ seconds. 4. Slowly push with your legs to return to standing. Do not use your hands to pull yourself to standing. Repeat __________ times. Complete this exercise __________ times a day. Wall slides This exercise strengthens the muscles in front of your thigh and knee (quadriceps). 1. Lean your back against a smooth wall or door, and walk your feet out 18-24 inches (46-61 cm) from it. 2. Place your feet hip-width apart. 3.  Slowly slide down the wall or door until your knees bend __________ degrees. Keep your knees over your heels, not over your toes. Keep your knees in line with your hips. 4. Hold this position for __________ seconds. Repeat __________ times. Complete this exercise __________ times a day. Straight leg raises This exercise strengthens the muscles that rotate the leg at the hip and move it away from your body (hip abductors). 1. Lie on your side with your left / right leg in the top position. Lie so your head, shoulder, knee, and hip line up. You may bend your bottom knee to help you keep your balance. 2. Roll your hips slightly forward so your hips are stacked directly over each other and your left / right knee is facing forward. 3. Leading with your heel, lift your top leg 4-6 inches (10-15 cm). You should feel the muscles in your outer hip lifting. ? Do not let your foot drift forward. ? Do not let your knee  roll toward the ceiling. 4. Hold this position for __________ seconds. 5. Slowly return your leg to the starting position. 6. Let your muscles relax completely after each repetition. Repeat __________ times. Complete this exercise __________ times a day. Straight leg raises This exercise stretches the muscles that move your hips away from the front of the pelvis (hip extensors). 1. Lie on your abdomen on a firm surface. You can put a pillow under your hips if that is more comfortable. 2. Tense the muscles in your buttocks and lift your left / right leg about 4-6 inches (10-15 cm). Keep your knee straight as you lift your leg. 3. Hold this position for __________ seconds. 4. Slowly lower your leg to the starting position. 5. Let your leg relax completely after each repetition. Repeat __________ times. Complete this exercise __________ times a day. This information is not intended to replace advice given to you by your health care provider. Make sure you discuss any questions you have with your health care provider. Document Released: 04/06/2005 Document Revised: 03/13/2018 Document Reviewed: 03/13/2018 Elsevier Patient Education  2020 [employer].

## 2019-04-09 NOTE — Progress Notes (Signed)
Remote pacemaker transmission.   

## 2019-05-21 ENCOUNTER — Ambulatory Visit (INDEPENDENT_AMBULATORY_CARE_PROVIDER_SITE_OTHER): Payer: Medicare Other | Admitting: Psychiatry

## 2019-05-21 DIAGNOSIS — F331 Major depressive disorder, recurrent, moderate: Secondary | ICD-10-CM | POA: Diagnosis not present

## 2019-06-26 ENCOUNTER — Ambulatory Visit (INDEPENDENT_AMBULATORY_CARE_PROVIDER_SITE_OTHER): Payer: Medicare Other | Admitting: *Deleted

## 2019-06-26 DIAGNOSIS — I495 Sick sinus syndrome: Secondary | ICD-10-CM

## 2019-06-27 LAB — CUP PACEART REMOTE DEVICE CHECK
Battery Remaining Longevity: 57 mo
Battery Voltage: 2.99 V
Brady Statistic AP VP Percent: 0.05 %
Brady Statistic AP VS Percent: 99.52 %
Brady Statistic AS VP Percent: 0 %
Brady Statistic AS VS Percent: 0.43 %
Brady Statistic RA Percent Paced: 99.56 %
Brady Statistic RV Percent Paced: 0.05 %
Date Time Interrogation Session: 20210121134633
Implantable Lead Implant Date: 20160202
Implantable Lead Implant Date: 20160202
Implantable Lead Location: 753859
Implantable Lead Location: 753860
Implantable Lead Model: 5076
Implantable Lead Model: 5076
Implantable Pulse Generator Implant Date: 20160202
Lead Channel Impedance Value: 418 Ohm
Lead Channel Impedance Value: 437 Ohm
Lead Channel Impedance Value: 513 Ohm
Lead Channel Impedance Value: 513 Ohm
Lead Channel Pacing Threshold Amplitude: 0.625 V
Lead Channel Pacing Threshold Amplitude: 1 V
Lead Channel Pacing Threshold Pulse Width: 0.4 ms
Lead Channel Pacing Threshold Pulse Width: 0.4 ms
Lead Channel Sensing Intrinsic Amplitude: 20.25 mV
Lead Channel Sensing Intrinsic Amplitude: 20.25 mV
Lead Channel Sensing Intrinsic Amplitude: 6 mV
Lead Channel Sensing Intrinsic Amplitude: 6 mV
Lead Channel Setting Pacing Amplitude: 2 V
Lead Channel Setting Pacing Amplitude: 2.5 V
Lead Channel Setting Pacing Pulse Width: 0.4 ms
Lead Channel Setting Sensing Sensitivity: 2.8 mV

## 2019-07-30 ENCOUNTER — Encounter: Payer: Self-pay | Admitting: Podiatry

## 2019-07-30 ENCOUNTER — Other Ambulatory Visit: Payer: Self-pay

## 2019-07-30 ENCOUNTER — Ambulatory Visit (INDEPENDENT_AMBULATORY_CARE_PROVIDER_SITE_OTHER): Payer: Medicare Other | Admitting: Podiatry

## 2019-07-30 VITALS — BP 132/75 | HR 85 | Temp 97.8°F

## 2019-07-30 DIAGNOSIS — B351 Tinea unguium: Secondary | ICD-10-CM

## 2019-07-30 DIAGNOSIS — L409 Psoriasis, unspecified: Secondary | ICD-10-CM | POA: Diagnosis not present

## 2019-07-30 DIAGNOSIS — B353 Tinea pedis: Secondary | ICD-10-CM | POA: Diagnosis not present

## 2019-07-30 DIAGNOSIS — L308 Other specified dermatitis: Secondary | ICD-10-CM | POA: Diagnosis not present

## 2019-07-30 DIAGNOSIS — M79675 Pain in left toe(s): Secondary | ICD-10-CM | POA: Diagnosis not present

## 2019-07-30 DIAGNOSIS — M79674 Pain in right toe(s): Secondary | ICD-10-CM

## 2019-07-30 DIAGNOSIS — E119 Type 2 diabetes mellitus without complications: Secondary | ICD-10-CM

## 2019-07-30 MED ORDER — DESOXIMETASONE 0.25 % EX CREA: 1.0000 "application " | TOPICAL_CREAM | Freq: Every day | CUTANEOUS | 6 refills | Status: DC

## 2019-07-30 MED ORDER — KETOCONAZOLE 2 % EX CREA: TOPICAL_CREAM | CUTANEOUS | 1 refills | Status: DC

## 2019-07-30 MED ORDER — CALCIPOTRIENE 0.005 % EX CREA: TOPICAL_CREAM | Freq: Two times a day (BID) | CUTANEOUS | 4 refills | Status: DC

## 2019-07-30 NOTE — Patient Instructions (Signed)
Diabetes Mellitus and Foot Care Foot care is an important part of your health, especially when you have diabetes. Diabetes may cause you to have problems because of poor blood flow (circulation) to your feet and legs, which can cause your skin to:  Become thinner and drier.  Break more easily.  Heal more slowly.  Peel and crack. You may also have nerve damage (neuropathy) in your legs and feet, causing decreased feeling in them. This means that you may not notice minor injuries to your feet that could lead to more serious problems. Noticing and addressing any potential problems early is the best way to prevent future foot problems. How to care for your feet Foot hygiene  Wash your feet daily with warm water and mild soap. Do not use hot water. Then, pat your feet and the areas between your toes until they are completely dry. Do not soak your feet as this can dry your skin.  Trim your toenails straight across. Do not dig under them or around the cuticle. File the edges of your nails with an emery board or nail file.  Apply a moisturizing lotion or petroleum jelly to the skin on your feet and to dry, brittle toenails. Use lotion that does not contain alcohol and is unscented. Do not apply lotion between your toes. Shoes and socks  Wear clean socks or stockings every day. Make sure they are not too tight. Do not wear knee-high stockings since they may decrease blood flow to your legs.  Wear shoes that fit properly and have enough cushioning. Always look in your shoes before you put them on to be sure there are no objects inside.  To break in new shoes, wear them for just a few hours a day. This prevents injuries on your feet. Wounds, scrapes, corns, and calluses  Check your feet daily for blisters, cuts, bruises, sores, and redness. If you cannot see the bottom of your feet, use a mirror or ask someone for help.  Do not cut corns or calluses or try to remove them with medicine.  If you  find a minor scrape, cut, or break in the skin on your feet, keep it and the skin around it clean and dry. You may clean these areas with mild soap and water. Do not clean the area with peroxide, alcohol, or iodine.  If you have a wound, scrape, corn, or callus on your foot, look at it several times a day to make sure it is healing and not infected. Check for: ? Redness, swelling, or pain. ? Fluid or blood. ? Warmth. ? Pus or a bad smell. General instructions  Do not cross your legs. This may decrease blood flow to your feet.  Do not use heating pads or hot water bottles on your feet. They may burn your skin. If you have lost feeling in your feet or legs, you may not know this is happening until it is too late.  Protect your feet from hot and cold by wearing shoes, such as at the beach or on hot pavement.  Schedule a complete foot exam at least once a year (annually) or more often if you have foot problems. If you have foot problems, report any cuts, sores, or bruises to your health care provider immediately. Contact a health care provider if:  You have a medical condition that increases your risk of infection and you have any cuts, sores, or bruises on your feet.  You have an injury that is not   healing.  You have redness on your legs or feet.  You feel burning or tingling in your legs or feet.  You have pain or cramps in your legs and feet.  Your legs or feet are numb.  Your feet always feel cold.  You have pain around a toenail. Get help right away if:  You have a wound, scrape, corn, or callus on your foot and: ? You have pain, swelling, or redness that gets worse. ? You have fluid or blood coming from the wound, scrape, corn, or callus. ? Your wound, scrape, corn, or callus feels warm to the touch. ? You have pus or a bad smell coming from the wound, scrape, corn, or callus. ? You have a fever. ? You have a red line going up your leg. Summary  Check your feet every day  for cuts, sores, red spots, swelling, and blisters.  Moisturize feet and legs daily.  Wear shoes that fit properly and have enough cushioning.  If you have foot problems, report any cuts, sores, or bruises to your health care provider immediately.  Schedule a complete foot exam at least once a year (annually) or more often if you have foot problems. This information is not intended to replace advice given to you by your health care provider. Make sure you discuss any questions you have with your health care provider. Document Revised: 02/13/2019 Document Reviewed: 06/24/2016 Else[employer] Patient Education  2020 [employer]  

## 2019-08-01 NOTE — Progress Notes (Signed)
Subjective: [name] is a very pleasant AAF who presents today referred by [name], PA for complaint of for diabetic foot evaluation and painful mycotic nails b/l that are difficult to trim. Pain interferes with ambulation. Aggravating factors include wearing enclosed shoe gear. Pain is relieved with periodic professional debridement.   She has seen Podiatry before and was followed at [facility]. She had skin scrapings done there which revealed psoriasis. She is requesting refill of her creams and ointments for her eczema and psoriasis.  She relates she has been diabetic since 2016 or 2017. She has h/o psoriasis and eczema brought on by traumatic childhood event. She is in therapy now and feels it is very helpful for her. Also notes burning/tingling in feet at times, but it does not keep her up at night.   Past Medical History:  Diagnosis Date  . Allergy   . Anal fissure 02/2012  . Anxiety   . Arthritis   . Asthma   . Bursitis    right hip  . Complication of anesthesia     woke up during a colonscopy, difficult to wake up post-op sometimes , requires lots of blankets to stay warm post op  . Depression   . Diabetes mellitus without complication ([place])    type 2 diet controlled  . Fatty liver    sees dr Collene Mares for  . Fibromyalgia   . Gallstones 02/15/2015  . GERD (gastroesophageal reflux disease)   . Hepatitis A infection 1979  . Hyperlipidemia   . Hypertension   . Irritable bowel syndrome (IBS)   . 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 Psoriasis    left foot  . 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 Tarsal tunnel syndrome of left side      Patient Active Problem List   Diagnosis Date Noted  . Laryngopharyngeal reflux (LPR) 05/24/2018  . TMJ (sprain of temporomandibular joint), initial encounter 05/24/2018  . Aortic atherosclerosis ([place]) 10/18/2017  . Acute coronary syndrome  ([place]) 02/09/2017  . Right knee pain 07/04/2016  . Pain in right ankle and joints of right foot 07/04/2016  . Psoriasis 03/10/2015  . Right foot pain 11/13/2014  . Ethmoid sinusitis 11/13/2014  . Abdominal pain, epigastric 11/13/2014  . Vitamin D deficiency 11/13/2014  . 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 Sick sinus syndrome ([place]) 07/08/2014  . Chest pain at rest 07/05/2014  . Chronic rhinitis 10/22/2013  . Fatty liver 10/22/2013  . Interstitial cystitis 10/22/2013  . Hip pain, right 10/22/2013  . Chronic insomnia 09/25/2012  . HTN (hypertension) 09/25/2012  . Hyperlipidemia 09/25/2012  . Hyperlipidemia associated with type 2 diabetes mellitus ([place]) 09/25/2012  . Diabetes mellitus type 2, controlled, without complications ([place]) 57/32/2025  . GERD 12/04/2008  . History of colonic polyps 12/04/2008  . CONSTIPATION 10/21/2008  . Fibromyalgia 10/21/2008  . LACTOSE INTOLERANCE 11/20/2007  . Chest pain, precordial 11/20/2007  . Lactose intolerance 11/20/2007  . Anxiety and depression 08/02/2007  . Osteoporosis 08/02/2007  . Diverticulosis of large intestine 11/16/2006  . EXTERNAL HEMORRHOIDS 05/12/2005     Past Surgical History:  Procedure Laterality Date  . ABDOMINAL HYSTERECTOMY  09/1994   partial  . APPENDECTOMY    . BILATERAL SALPINGOOPHORECTOMY Bilateral 04/2011  . CARDIAC CATHETERIZATION  10/08/2007  . CESAREAN SECTION     x 1  . CHOLECYSTECTOMY N/A 02/16/2015   Procedure: LAPAROSCOPIC CHOLECYSTECTOMY WITH INTRAOPERATIVE  CHOLANGIOGRAM;  Surgeon: Johnathan Hausen, MD;  Location: [facility];  Service: General;  Laterality: N/A;  . COLONOSCOPY WITH PROPOFOL N/A 08/19/2016   Procedure: COLONOSCOPY WITH PROPOFOL;  Surgeon: Carol Ada, MD;  [facility];  Service: Endoscopy;  Laterality: N/A;  . CYSTOSCOPY W/ URETERAL STENT PLACEMENT  08/04/2009  . EXAMINATION UNDER ANESTHESIA  02/17/2012   Procedure: EXAM UNDER ANESTHESIA;  Surgeon: Pedro Earls, MD;  Location: [place];  Service: General;  Laterality: N/A;  Exam under anesthesia with lateral internal sphincterotomy  . LAPAROSCOPIC LYSIS INTESTINAL ADHESIONS  08/04/2009  . LEFT HEART CATH AND CORONARY ANGIOGRAPHY N/A 02/10/2017   Procedure: LEFT HEART CATH AND CORONARY ANGIOGRAPHY;  Surgeon: Dixie Dials, MD;  Location: [place] [facility];  Service: Cardiovascular;  Laterality: N/A;  . PERMANENT PACEMAKER INSERTION N/A 07/08/2014   MDT MRI compatible dual chamber pacemaker implanted by Dr Rayann Heman for symptomatic bradycardia  . SHOULDER ARTHROSCOPY W/ ROTATOR CUFF REPAIR Left 1999  . SMALL INTESTINE SURGERY    . SPHINCTEROTOMY  02/17/2012   Procedure: SPHINCTEROTOMY;  Surgeon: Pedro Earls, MD;  Location: [place];  Service: General;  Laterality: N/A;     Current Outpatient Medications on File Prior to Visit  Medication Sig Dispense Refill  . ALPRAZolam (XANAX) 0.5 MG tablet Take 1 tablet by mouth as needed.  0  . azelastine (ASTELIN) 0.1 % nasal spray Place 2 sprays into both nostrils 2 (two) times daily. Use in each nostril as directed 30 mL prn  . Azilsartan Medoxomil (EDARBI) 40 MG TABS Take 40 mg by mouth daily.     . beclomethasone (QVAR) 40 MCG/ACT inhaler Inhale 2 puffs into the lungs 2 (two) times daily as needed (for shortness of breath).     . Blood Glucose Monitoring Suppl (ACCU-CHEK GUIDE) w/Device KIT USE DAILY AS DIRECTED 1 kit 0  . busPIRone (BUSPAR) 5 MG tablet     . calcipotriene (DOVONOX) 0.005 % cream Apply 1 application topically as needed.   6  . cetirizine (ZYRTEC) 10 MG tablet TAKE 1 TABLET (10 MG TOTAL) BY MOUTH DAILY. 90 tablet 1  . Cholecalciferol (VITAMIN D) 2000 units CAPS Take 1 capsule (2,000 Units total) by mouth daily. 90 capsule 3  . cyclobenzaprine (FLEXERIL) 10 MG tablet Take 10 mg by mouth 3 (three) times daily as needed for muscle spasms.     . diphenoxylate-atropine (LOMOTIL) 2.5-0.025 MG tablet Take 2 tablets by mouth 4 (four) times daily as  needed for diarrhea or loose stools.     . docusate sodium (COLACE) 100 MG capsule TAKE 1 CAPSULE (100 MG TOTAL) BY MOUTH DAILY AS NEEDED FOR MILD CONSTIPATION. 30 capsule 0  . escitalopram (LEXAPRO) 20 MG tablet Take 20 mg by mouth daily 90 tablet 3  . ezetimibe (ZETIA) 10 MG tablet Take 1 tablet (10 mg total) by mouth every evening. 90 tablet 3  . FLOVENT HFA 110 MCG/ACT inhaler Take 2 puffs by mouth as needed.  12  . fluticasone (FLONASE) 50 MCG/ACT nasal spray Place 2 sprays into both nostrils daily. (Patient taking differently: Place 2 sprays into both nostrils as needed. ) 16 g 12  . hydrALAZINE (APRESOLINE) 25 MG tablet Take 25 mg by mouth 2 (two) times daily.    . hydrocortisone 2.5 % cream Place rectally.    Marland Kitchen LOTEMAX SM 0.38 % GEL Place 1 drop into both eyes as needed.  1  . magic mouthwash w/lidocaine SOLN Take 10 mLs  by mouth every 2 (two) hours as needed for mouth pain. (Patient taking differently: Take 10 mLs by mouth as needed for mouth pain. ) 360 mL prn  . meloxicam (MOBIC) 15 MG tablet TAKE 1 TABLET BY MOUTH EVERY DAY (Patient taking differently: as needed. ) 30 tablet 3  . metFORMIN (GLUCOPHAGE) 500 MG tablet TAKE ONE HALF TABLET (250 MG DOSE) BY MOUTH 2 (TWO) TIMES DAILY WITH MEALS.    . metoprolol tartrate (LOPRESSOR) 25 MG tablet Take 0.5 tablets (12.5 mg total) by mouth 2 (two) times daily. 30 tablet 3  . Multiple Vitamin (MULTIVITAMIN) tablet Take 1 tablet by mouth daily.    Marland Kitchen NITROSTAT 0.4 MG SL tablet Place 0.4 mg under the tongue every 5 (five) minutes as needed for chest pain. For chest pain.    Marland Kitchen olopatadine (PATANOL) 0.1 % ophthalmic solution Place 1 drop into both eyes 2 (two) times daily. 5 mL prn  . ondansetron (ZOFRAN ODT) 4 MG disintegrating tablet Take 1 tablet (4 mg total) by mouth every 8 (eight) hours as needed for nausea. 10 tablet 0  . oxyCODONE-acetaminophen (PERCOCET) 10-325 MG tablet Take 1.5 tablets by mouth daily.    Marland Kitchen Peppermint Oil (IBGARD PO) Take by  mouth as needed.     Marland Kitchen PROAIR HFA 108 (90 Base) MCG/ACT inhaler INHALE 2 PUFFS 4 TIMES A DAY AS NEEDED (Patient taking differently: INHALE 2 PUFFS 4 TIMES A DAY AS NEEDED FOR SHORTNESS OF BREATH) 8 Inhaler 0  . Probiotic Product (PROBIOTIC DAILY) CAPS Take 1 capsule by mouth daily.     Marland Kitchen zolpidem (AMBIEN) 10 MG tablet Take 10 mg by mouth at bedtime as needed for sleep.     Marland Kitchen amoxicillin (AMOXIL) 500 MG capsule Take 500 mg by mouth 3 (three) times daily.    . ciprofloxacin (CIPRO) 500 MG tablet Take 500 mg by mouth 2 (two) times daily as needed (for diverticulitis).    Marland Kitchen dicyclomine (BENTYL) 10 MG capsule TAKE 1 CAPSULE (10 MG TOTAL) BY MOUTH 4 (FOUR) TIMES DAILY - BEFORE MEALS AND AT BEDTIME. (Patient not taking: Reported on 07/30/2019) 30 capsule 1  . ranitidine (ZANTAC) 150 MG tablet Take 150 mg by mouth daily as needed for heartburn.      No current facility-administered medications on file prior to visit.     Allergies  Allergen Reactions  . Codeine Other (See Comments)    HALLUCINATIONS   . Propoxyphene N-Acetaminophen Other (See Comments)    HALLUCINATIONS  . Sulfonamide Derivatives Hives, Itching and Swelling  . Alka-Seltzer [Aspirin Effervescent] Other (See Comments)    "like she's fading away", jittery   . Norvasc [Amlodipine Besylate] Other (See Comments)    "passed out"  . Red Dye Other (See Comments)    "burning and tingling sensation"  . Singulair [Montelukast Sodium] Other (See Comments)    Increased depression, sadness  . Bupropion Anxiety and Other (See Comments)    headache  . Buspirone Other (See Comments)    Abdominal bloating and increased intestinal gas  . Iohexol Nausea Only    JITTERY    . Latex Hives and Itching     Social History   Occupational History  . Occupation: Disabled    Employer: RETIRED    Comment: Fibromyalgia, Depression, Anxiety  Tobacco Use  . Smoking status: Former Smoker    Packs/day: 0.50    Years: 37.00    Pack years: 18.50     Types: Cigarettes    Quit date: 06/05/2012  Years since quitting: 7.1  . Smokeless tobacco: Never Used  Substance and Sexual Activity  . Alcohol use: No  . Drug use: No  . Sexual activity: Not Currently    Birth control/protection: Post-menopausal     Family History  Problem Relation Age of Onset  . Cancer Mother        liver  . Diabetes Mother   . Hyperlipidemia Mother   . Hypertension Mother   . Stroke Mother   . Cancer Maternal Grandmother        breast  . Anesthesia problems Sister        hard to wake up post-op  . Heart disease Father   . Hyperlipidemia Brother   . HIV/AIDS Brother   . Cancer Maternal Grandfather        colon  . Diabetes Brother   . Cancer Brother        lung  . Cancer Paternal Grandfather        COLON     Immunization History  Administered Date(s) Administered  . Influenza,inj,Quad PF,6+ Mos 03/10/2015, 04/12/2016, 03/24/2017  . Influenza-Unspecified 03/17/2014  . Pneumococcal Polysaccharide-23 03/10/2015  . Td 02/16/2012  . Tdap 02/16/2012     Objective: Vitals:   07/30/19 1608  BP: 132/75  Pulse: 85  Temp: 97.8 F (36.6 C)    Vascular Examination:  Capillary refill time to digits immediate b/l, palpable DP pulses b/l, palpable PT pulses b/l, pedal hair sparse b/l and skin temperature gradient within normal limits b/l  Dermatological Examination: No open wounds bilaterally, no interdigital macerations bilaterally, toenails 1-5 b/l elongated, dystrophic, thickened, crumbly with subungual debris, plantar psoriatic plaques noted on both feet left >right. No erythema, no edema, no drainage, nno flocculcence and diffuse scaling noted peripherally and plantarly b/l feet with mild foot odor.  No interdigital macerations.  No blisters, no weeping. No signs of secondary bacterial infection noted  Musculoskeletal: Normal muscle strength 5/5 to all lower extremity muscle groups bilaterally, no pain crepitus or joint limitation noted with ROM  b/l and bunion deformity noted b/l  Neurological: Protective sensation intact 5/5 intact bilaterally with 10g monofilament b/l and vibratory sensation intact b/l  Assessment: 1. Pain due to onychomycosis of toenails of both feet   2. Psoriasis   3. Other eczema   4. Tinea pedis of both feet   5. Encounter for diabetic foot exam [facility])     Plan: Diabetic foot examination performed on today's visit. -Discussed diabetic foot care principles. Literature dispensed on today. -Her creams and ointments were sent to her preferred pharmacy on today: Calcipotriene Cream 0.005% to be applied to both feet twice daily. Ketoconazole Cream 2%: Apply to feet once daily for 6 weeks. Desoximetsone Ointment 0.25% to be applied to both feet once daily. -Toenails 1-5 b/l were debrided in length and girth with sterile nail nippers and dremel without iatrogenic bleeding. -Patient to continue soft, supportive shoe gear daily. -Patient to report any pedal injuries to medical professional immediately. -Patient/POA to call should there be question/concern in the interim.  Return in about 3 months (around 10/27/2019) for diabetic nail trim.

## 2019-09-25 ENCOUNTER — Ambulatory Visit (INDEPENDENT_AMBULATORY_CARE_PROVIDER_SITE_OTHER): Payer: Medicare Other | Admitting: *Deleted

## 2019-09-25 DIAGNOSIS — I495 Sick sinus syndrome: Secondary | ICD-10-CM

## 2019-09-27 ENCOUNTER — Ambulatory Visit: Payer: Medicare Other | Admitting: Physician Assistant

## 2019-09-27 LAB — CUP PACEART REMOTE DEVICE CHECK
Battery Remaining Longevity: 59 mo
Battery Voltage: 2.99 V
Brady Statistic AP VP Percent: 0.05 %
Brady Statistic AP VS Percent: 99.72 %
Brady Statistic AS VP Percent: 0 %
Brady Statistic AS VS Percent: 0.23 %
Brady Statistic RA Percent Paced: 99.71 %
Brady Statistic RV Percent Paced: 0.05 %
Date Time Interrogation Session: 20210422210930
Implantable Lead Implant Date: 20160202
Implantable Lead Implant Date: 20160202
Implantable Lead Location: 753859
Implantable Lead Location: 753860
Implantable Lead Model: 5076
Implantable Lead Model: 5076
Implantable Pulse Generator Implant Date: 20160202
Lead Channel Impedance Value: 418 Ohm
Lead Channel Impedance Value: 437 Ohm
Lead Channel Impedance Value: 513 Ohm
Lead Channel Impedance Value: 532 Ohm
Lead Channel Pacing Threshold Amplitude: 0.625 V
Lead Channel Pacing Threshold Amplitude: 0.875 V
Lead Channel Pacing Threshold Pulse Width: 0.4 ms
Lead Channel Pacing Threshold Pulse Width: 0.4 ms
Lead Channel Sensing Intrinsic Amplitude: 18.5 mV
Lead Channel Sensing Intrinsic Amplitude: 18.5 mV
Lead Channel Sensing Intrinsic Amplitude: 4 mV
Lead Channel Sensing Intrinsic Amplitude: 4 mV
Lead Channel Setting Pacing Amplitude: 2 V
Lead Channel Setting Pacing Amplitude: 2.5 V
Lead Channel Setting Pacing Pulse Width: 0.4 ms
Lead Channel Setting Sensing Sensitivity: 2.8 mV

## 2019-09-27 NOTE — Progress Notes (Signed)
PPM Remote  

## 2019-10-02 NOTE — Progress Notes (Deleted)
Office Visit Note  Patient: [name]             Date of Birth: 12-17-1955           MRN: [id number]             PCP: [name], [place] Referring: [name], [place] Visit Date: 10/03/2019 Occupation: @GUAROCC @  Subjective:  No chief complaint on file.   History of Present Illness: [name] is a 64 y.o. female ***   Activities of Daily Living:  Patient reports morning stiffness for *** {minute/hour:19697}.   Patient {ACTIONS;DENIES/REPORTS:21021675::"Denies"} nocturnal pain.  Difficulty dressing/grooming: {ACTIONS;DENIES/REPORTS:21021675::"Denies"} Difficulty climbing stairs: {ACTIONS;DENIES/REPORTS:21021675::"Denies"} Difficulty getting out of chair: {ACTIONS;DENIES/REPORTS:21021675::"Denies"} Difficulty using hands for taps, buttons, cutlery, and/or writing: {ACTIONS;DENIES/REPORTS:21021675::"Denies"}  No Rheumatology ROS completed.   PMFS History:  Patient Active Problem List   Diagnosis Date Noted  . Laryngopharyngeal reflux (LPR) 05/24/2018  . TMJ (sprain of temporomandibular joint), initial encounter 05/24/2018  . Aortic atherosclerosis ([place]) 10/18/2017  . Acute coronary syndrome ([place]) 02/09/2017  . Right knee pain 07/04/2016  . Pain in right ankle and joints of right foot 07/04/2016  . Psoriasis 03/10/2015  . Right foot pain 11/13/2014  . Ethmoid sinusitis 11/13/2014  . Abdominal pain, epigastric 11/13/2014  . Vitamin D deficiency 11/13/2014  . 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 Sick sinus syndrome ([place]) 07/08/2014  . Chest pain at rest 07/05/2014  . Chronic rhinitis 10/22/2013  . Fatty liver 10/22/2013  . Interstitial cystitis 10/22/2013  . Hip pain, right 10/22/2013  . Chronic insomnia 09/25/2012  . HTN (hypertension) 09/25/2012  . Hyperlipidemia 09/25/2012  . Hyperlipidemia associated with type 2 diabetes mellitus ([place]) 09/25/2012  . Diabetes mellitus type 2, controlled, without complications ([place]) 44/81/8563  . GERD 12/04/2008  . History of colonic  polyps 12/04/2008  . CONSTIPATION 10/21/2008  . Fibromyalgia 10/21/2008  . LACTOSE INTOLERANCE 11/20/2007  . Chest pain, precordial 11/20/2007  . Lactose intolerance 11/20/2007  . Anxiety and depression 08/02/2007  . Osteoporosis 08/02/2007  . Diverticulosis of large intestine 11/16/2006  . EXTERNAL HEMORRHOIDS 05/12/2005    Past Medical History:  Diagnosis Date  . Allergy   . Anal fissure 02/2012  . Anxiety   . Arthritis   . Asthma   . Bursitis    right hip  . Complication of anesthesia     woke up during a colonscopy, difficult to wake up post-op sometimes , requires lots of blankets to stay warm post op  . Depression   . Diabetes mellitus without complication ([place])    type 2 diet controlled  . Fatty liver    sees dr Collene Mares for  . Fibromyalgia   . Gallstones 02/15/2015  . GERD (gastroesophageal reflux disease)   . Hepatitis A infection 1979  . Hyperlipidemia   . Hypertension   . Irritable bowel syndrome (IBS)   . 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 Psoriasis    left foot  . 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 Tarsal tunnel syndrome of left side     Family History  Problem Relation Age of Onset  . Cancer Mother        liver  . Diabetes Mother   . Hyperlipidemia Mother   . Hypertension Mother   . Stroke Mother   . Cancer Maternal Grandmother        breast  . Anesthesia problems Sister  hard to wake up post-op  . Heart disease Father   . Hyperlipidemia Brother   . HIV/AIDS Brother   . Cancer Maternal Grandfather        colon  . Diabetes Brother   . Cancer Brother        lung  . Cancer Paternal Grandfather        COLON   Past Surgical History:  Procedure Laterality Date  . ABDOMINAL HYSTERECTOMY  09/1994   partial  . APPENDECTOMY    . BILATERAL SALPINGOOPHORECTOMY Bilateral 04/2011  . CARDIAC CATHETERIZATION  10/08/2007  . CESAREAN SECTION     x 1  .  CHOLECYSTECTOMY N/A 02/16/2015   Procedure: LAPAROSCOPIC CHOLECYSTECTOMY WITH INTRAOPERATIVE CHOLANGIOGRAM;  Surgeon: Johnathan Hausen, MD;  [facility];  Service: General;  Laterality: N/A;  . COLONOSCOPY WITH PROPOFOL N/A 08/19/2016   Procedure: COLONOSCOPY WITH PROPOFOL;  Surgeon: Carol Ada, MD;  [facility];  Service: Endoscopy;  Laterality: N/A;  . CYSTOSCOPY W/ URETERAL STENT PLACEMENT  08/04/2009  . EXAMINATION UNDER ANESTHESIA  02/17/2012   Procedure: EXAM UNDER ANESTHESIA;  Surgeon: Pedro Earls, MD;  Location: [place];  Service: General;  Laterality: N/A;  Exam under anesthesia with lateral internal sphincterotomy  . LAPAROSCOPIC LYSIS INTESTINAL ADHESIONS  08/04/2009  . LEFT HEART CATH AND CORONARY ANGIOGRAPHY N/A 02/10/2017   Procedure: LEFT HEART CATH AND CORONARY ANGIOGRAPHY;  Surgeon: Dixie Dials, MD;  Location: [place] [facility];  Service: Cardiovascular;  Laterality: N/A;  . PERMANENT PACEMAKER INSERTION N/A 07/08/2014   MDT MRI compatible dual chamber pacemaker implanted by Dr Rayann Heman for symptomatic bradycardia  . SHOULDER ARTHROSCOPY W/ ROTATOR CUFF REPAIR Left 1999  . SMALL INTESTINE SURGERY    . SPHINCTEROTOMY  02/17/2012   Procedure: SPHINCTEROTOMY;  Surgeon: Pedro Earls, MD;  Location: [place];  Service: General;  Laterality: N/A;   Social History   Social History Narrative   Lives alone.  Daughter and granddaughter live in [place], [place]. Pt exercise sometimes.      Immunization History  Administered Date(s) Administered  . Influenza,inj,Quad PF,6+ Mos 03/10/2015, 04/12/2016, 03/24/2017  . Influenza-Unspecified 03/17/2014  . Pneumococcal Polysaccharide-23 03/10/2015  . Td 02/16/2012  . Tdap 02/16/2012     Objective: Vital Signs: There were no vitals taken for this visit.   Physical Exam   Musculoskeletal Exam: ***  CDAI Exam: CDAI Score: -- Patient Global: --; Provider Global: -- Swollen: --;  Tender: -- Joint Exam 10/03/2019   No joint exam has been documented for this visit   There is currently no information documented on the homunculus. Go to the Rheumatology activity and complete the homunculus joint exam.  Investigation: No additional findings.  Imaging: CUP PACEART REMOTE DEVICE CHECK  Result Date: 09/27/2019 Scheduled remote reviewed. Normal device function.  Next remote 91 days. [place]   Recent Labs: Lab Results  Component Value Date   WBC 5.9 10/18/2017   HGB 12.5 10/18/2017   PLT 296 10/18/2017   NA 143 10/18/2017   K 4.4 10/18/2017   CL 102 10/18/2017   CO2 25 10/18/2017   GLUCOSE 99 10/18/2017   BUN 8 10/18/2017   CREATININE 0.73 10/18/2017   BILITOT 0.7 10/18/2017   ALKPHOS 55 10/18/2017   AST 32 10/18/2017   ALT 54 (H) 10/18/2017   PROT 7.4 10/18/2017   ALBUMIN 4.6 10/18/2017   CALCIUM 9.5 10/18/2017   GFRAA 102 10/18/2017    Speciality Comments: No specialty comments available.  Procedures:  No procedures performed Allergies: Codeine, Propoxyphene n-acetaminophen, Sulfonamide derivatives, Alka-seltzer [aspirin effervescent], Norvasc [amlodipine besylate], Red dye, Singulair [montelukast sodium], Bupropion, Buspirone, Iohexol, and Latex   Assessment / Plan:     Visit Diagnoses: No diagnosis found.  Orders: No orders of the defined types were placed in this encounter.  No orders of the defined types were placed in this encounter.   Face-to-face time spent with patient was *** minutes. Greater than 50% of time was spent in counseling and coordination of care.  Follow-Up Instructions: No follow-ups on file.   Ofilia Neas, PA-C  Note - This record has been created using Dragon software.  Chart creation errors have been sought, but may not always  have been located. Such creation errors do not reflect on  the standard of medical care.

## 2019-10-03 ENCOUNTER — Ambulatory Visit: Payer: Medicare Other | Admitting: Physician Assistant

## 2019-10-04 NOTE — Progress Notes (Signed)
Office Visit Note  Patient: [name]             Date of Birth: 10-08-1955           MRN: [id number]             PCP: [name], PA Referring: [name], PA Visit Date: 10/10/2019 Occupation: @GUAROCC @  Subjective:  Fatigue   History of Present Illness: [name] is a 64 y.o. female with history of fibromyalgia and DDD.  She continues to have generalized myalgias and arthralgias due to underlying fibromyalgia.  She has been experiencing severe fatigue recently.  She has difficulty getting up in the morning due to the level of fatigue.  She has been experiencing nocturnal pain which has caused interrupted sleep at night which is contributing to her symptoms as well.  She continues to have chronic lower back pain.  She states recently she has been noticing worsening hair loss and has psoriasis on the plantar aspect of both feet.  Activities of Daily Living:  Patient reports joint stiffness all day  Patient Reports nocturnal pain.  Difficulty dressing/grooming: Reports Difficulty climbing stairs: Reports Difficulty getting out of chair: Reports Difficulty using hands for taps, buttons, cutlery, and/or writing: Denies  Review of Systems  Constitutional: Positive for fatigue.  HENT: Negative for mouth sores, mouth dryness and nose dryness.   Eyes: Positive for dryness. Negative for pain and visual disturbance.  Respiratory: Negative for cough, hemoptysis, shortness of breath and difficulty breathing.   Cardiovascular: Negative for chest pain, palpitations, hypertension and swelling in legs/feet.  Gastrointestinal: Positive for diarrhea. Negative for blood in stool and constipation.  Endocrine: Negative for increased urination.  Genitourinary: Negative for difficulty urinating and painful urination.  Musculoskeletal: Positive for arthralgias, joint pain, myalgias, muscle weakness, morning stiffness, muscle tenderness and myalgias. Negative for joint swelling.  Skin:  Positive for rash. Negative for color change, pallor, hair loss, nodules/bumps, skin tightness, ulcers and sensitivity to sunlight.  Allergic/Immunologic: Negative for susceptible to infections.  Neurological: Positive for headaches. Negative for memory loss.  Hematological: Negative for bruising/bleeding tendency and swollen glands.  Psychiatric/Behavioral: Positive for sleep disturbance. Negative for depressed mood and confusion. The patient is not nervous/anxious.     PMFS History:  Patient Active Problem List   Diagnosis Date Noted  . Laryngopharyngeal reflux (LPR) 05/24/2018  . TMJ (sprain of temporomandibular joint), initial encounter 05/24/2018  . Aortic atherosclerosis ([place]) 10/18/2017  . Acute coronary syndrome ([place]) 02/09/2017  . Right knee pain 07/04/2016  . Pain in right ankle and joints of right foot 07/04/2016  . Psoriasis 03/10/2015  . Right foot pain 11/13/2014  . Ethmoid sinusitis 11/13/2014  . Abdominal pain, epigastric 11/13/2014  . Vitamin D deficiency 11/13/2014  . 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 Sick sinus syndrome ([place]) 07/08/2014  . Chest pain at rest 07/05/2014  . Chronic rhinitis 10/22/2013  . Fatty liver 10/22/2013  . Interstitial cystitis 10/22/2013  . Hip pain, right 10/22/2013  . Chronic insomnia 09/25/2012  . HTN (hypertension) 09/25/2012  . Hyperlipidemia 09/25/2012  . Hyperlipidemia associated with type 2 diabetes mellitus ([place]) 09/25/2012  . Diabetes mellitus type 2, controlled, without complications ([place]) 59/93/5701  . GERD 12/04/2008  . History of colonic polyps 12/04/2008  . CONSTIPATION 10/21/2008  . Fibromyalgia 10/21/2008  . LACTOSE INTOLERANCE 11/20/2007  . Chest pain, precordial 11/20/2007  . Lactose intolerance 11/20/2007  . Anxiety and depression 08/02/2007  . Osteoporosis 08/02/2007  . Diverticulosis of large intestine 11/16/2006  .  EXTERNAL HEMORRHOIDS 05/12/2005    Past Medical History:  Diagnosis Date  . Allergy   . Anal  fissure 02/2012  . Anxiety   . Arthritis   . Asthma   . Bursitis    right hip  . Complication of anesthesia     woke up during a colonscopy, difficult to wake up post-op sometimes , requires lots of blankets to stay warm post op  . Depression   . Diabetes mellitus without complication ([place])    type 2 diet controlled  . Fatty liver    sees dr Collene Mares for  . Fibromyalgia   . Gallstones 02/15/2015  . GERD (gastroesophageal reflux disease)   . Hepatitis A infection 1979  . Hyperlipidemia   . Hypertension   . Irritable bowel syndrome (IBS)   . 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 Psoriasis    left foot  . 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 Tarsal tunnel syndrome of left side     Family History  Problem Relation Age of Onset  . Cancer Mother        liver  . Diabetes Mother   . Hyperlipidemia Mother   . Hypertension Mother   . Stroke Mother   . Cancer Maternal Grandmother        breast  . Anesthesia problems Sister        hard to wake up post-op  . Heart disease Father   . Hyperlipidemia Brother   . HIV/AIDS Brother   . Cancer Maternal Grandfather        colon  . Diabetes Brother   . Cancer Brother        lung  . Cancer Paternal Grandfather        COLON   Past Surgical History:  Procedure Laterality Date  . ABDOMINAL HYSTERECTOMY  09/1994   partial  . APPENDECTOMY    . BILATERAL SALPINGOOPHORECTOMY Bilateral 04/2011  . CARDIAC CATHETERIZATION  10/08/2007  . CESAREAN SECTION     x 1  . CHOLECYSTECTOMY N/A 02/16/2015   Procedure: LAPAROSCOPIC CHOLECYSTECTOMY WITH INTRAOPERATIVE CHOLANGIOGRAM;  Surgeon: Johnathan Hausen, MD;  Location: [facility];  Service: General;  Laterality: N/A;  . COLONOSCOPY WITH PROPOFOL N/A 08/19/2016   Procedure: COLONOSCOPY WITH PROPOFOL;  Surgeon: Carol Ada, MD;  Location: [facility];  Service: Endoscopy;  Laterality: N/A;  . CYSTOSCOPY W/ URETERAL STENT  PLACEMENT  08/04/2009  . EXAMINATION UNDER ANESTHESIA  02/17/2012   Procedure: EXAM UNDER ANESTHESIA;  Surgeon: Pedro Earls, MD;  Location: [facility];  Service: General;  Laterality: N/A;  Exam under anesthesia with lateral internal sphincterotomy  . LAPAROSCOPIC LYSIS INTESTINAL ADHESIONS  08/04/2009  . LEFT HEART CATH AND CORONARY ANGIOGRAPHY N/A 02/10/2017   Procedure: LEFT HEART CATH AND CORONARY ANGIOGRAPHY;  Surgeon: Dixie Dials, MD;  Location: [facility];  Service: Cardiovascular;  Laterality: N/A;  . PERMANENT PACEMAKER INSERTION N/A 07/08/2014   MDT MRI compatible dual chamber pacemaker implanted by Dr Rayann Heman for symptomatic bradycardia  . SHOULDER ARTHROSCOPY W/ ROTATOR CUFF REPAIR Left 1999  . SMALL INTESTINE SURGERY    . SPHINCTEROTOMY  02/17/2012   Procedure: SPHINCTEROTOMY;  Surgeon: Pedro Earls, MD;  Location: [facility];  Service: General;  Laterality: N/A;   Social History   Social History Narrative   Lives alone.  Daughter and granddaughter live in [place], [place]. Pt exercise  sometimes.      Immunization History  Administered Date(s) Administered  . Influenza,inj,Quad PF,6+ Mos 03/10/2015, 04/12/2016, 03/24/2017  . Influenza-Unspecified 03/17/2014  . Pneumococcal Polysaccharide-23 03/10/2015  . Td 02/16/2012  . Tdap 02/16/2012     Objective: Vital Signs: BP 125/82 (BP Location: Right Arm, Patient Position: Sitting, Cuff Size: Normal)   Pulse 76   Resp 15   Ht 5' 7"  (1.702 m)   Wt 171 lb (77.6 kg)   BMI 26.78 kg/m    Physical Exam Vitals and nursing note reviewed.  Constitutional:      Appearance: She is well-developed.  HENT:     Head: Normocephalic and atraumatic.  Eyes:     Conjunctiva/sclera: Conjunctivae normal.  Pulmonary:     Effort: Pulmonary effort is normal.  Abdominal:     General: Bowel sounds are normal.     Palpations: Abdomen is soft.  Musculoskeletal:     Cervical back: Normal range of motion.   Lymphadenopathy:     Cervical: No cervical adenopathy.  Skin:    General: Skin is warm and dry.     Capillary Refill: Capillary refill takes less than 2 seconds.     Comments: Psoriasis on the plantar aspect of both feet  Hair thinning.   Neurological:     Mental Status: She is alert and oriented to person, place, and time.  Psychiatric:        Behavior: Behavior normal.      Musculoskeletal Exam: Generalized hyperalgesia and positive tender points on exam. C-spine, thoracic spine, and lumbar spine good ROM.  Shoulder joints, elbow joints, wrist joints, MCPs, PIPs, and DIPs good ROM with no synovitis.  Hip joints, knee joints, ankle joints, MTPs, PIPs, and DIPs good ROM with no synovitis.  No warmth or effusion of knee joints.  No tenderness or swelling of ankle joints.    CDAI Exam: CDAI Score: -- Patient Global: --; Provider Global: -- Swollen: --; Tender: -- Joint Exam 10/10/2019   No joint exam has been documented for this visit   There is currently no information documented on the homunculus. Go to the Rheumatology activity and complete the homunculus joint exam.  Investigation: No additional findings.  Imaging: CUP PACEART REMOTE DEVICE CHECK  Result Date: 09/27/2019 Scheduled remote reviewed. Normal device function.  Next remote 91 days. [facility]   Recent Labs: Lab Results  Component Value Date   WBC 5.9 10/18/2017   HGB 12.5 10/18/2017   PLT 296 10/18/2017   NA 143 10/18/2017   K 4.4 10/18/2017   CL 102 10/18/2017   CO2 25 10/18/2017   GLUCOSE 99 10/18/2017   BUN 8 10/18/2017   CREATININE 0.73 10/18/2017   BILITOT 0.7 10/18/2017   ALKPHOS 55 10/18/2017   AST 32 10/18/2017   ALT 54 (H) 10/18/2017   PROT 7.4 10/18/2017   ALBUMIN 4.6 10/18/2017   CALCIUM 9.5 10/18/2017   GFRAA 102 10/18/2017    Speciality Comments: No specialty comments available.  Procedures:  No procedures performed Allergies: Codeine, Propoxyphene n-acetaminophen, Sulfonamide  derivatives, Alka-seltzer [aspirin effervescent], Norvasc [amlodipine besylate], Red dye, Singulair [montelukast sodium], Bupropion, Buspirone, Iohexol, and Latex   Assessment / Plan:     Visit Diagnoses: Fibromyalgia: She has generalized hyperalgesia and positive tender points on exam.  She continues to experience trapezius muscle spasms intermittently.  She has chronic lower back pain and takes Flexeril 10 mg by mouth as needed for muscle spasms.  She continues to have chronic fatigue, which has been worsening  recently.  She states that she has difficulty performing her ADLs due to the level of fatigue she has been experiencing.  We will obtain the following lab work for further evaluation.  She continues to take Ambien 10 mg by mouth at bedtime as needed for insomnia.  We discussed the importance of regular exercise and good sleep hygiene.  She will follow-up in the office in 6 months peer  Trapezius muscle spasm: She experiences trapezius muscle tension and muscle tenderness bilaterally.  She experiences spasms intermittently.  She takes Flexeril as needed for muscle spasms.  Chronic insomnia: She has interrupted sleep at night due nocturnal pain.  She takes Ambien 10 mg 1 tablet by mouth at bedtime as needed for insomnia.  Good sleep hygiene was discussed.  DDD (degenerative disc disease), lumbar: She experiences intermittent lower back pain.  She takes Flexeril as needed for muscle spasms.  She is not experiencing any symptoms of radiculopathy currently.  Psoriasis - Plantar aspect of both feet  Osteopenia of multiple sites: DEXA 07/04/2017 AP spine BMD 0.920 with T score -2.1.  She is taking vitamin D 2000 units daily.  We will check a vitamin D level today.  Vitamin D deficiency: She is taking vitamin D 2000 units daily.  We will check a vitamin D level today since she been experiencing severe fatigue.  Hair loss -She has been experiencing increased hair loss recently.  We will check the  following lab work today for further evaluation.  Plan: CK, TSH, Vitamin B12, VITAMIN D 25 Hydroxy (Vit-D Deficiency, Fractures), CBC with Differential/Platelet, COMPLETE METABOLIC PANEL WITH GFR, ANA, Sedimentation rate  Other fatigue - She has been experiencing severe fatigue.  She has difficulty performing ADLs due to her level of fatigue.  We will obtain the following lab work to further assess.  She was advised to follow-up with her PCP for further evaluation as well.  Plan: CK, TSH, Vitamin B12, VITAMIN D 25 Hydroxy (Vit-D Deficiency, Fractures), CBC with Differential/Platelet, COMPLETE METABOLIC PANEL WITH GFR, ANA, Sedimentation rate  Myalgia - She continues to have generalized myalgias likely due to underlying fibromyalgia.  We will check the following lab work today for further evaluation.  Plan: CK, TSH, VITAMIN D 25 Hydroxy (Vit-D Deficiency, Fractures), Sedimentation rate  Other medical conditions are listed as follows:   Diverticulosis of large intestine without hemorrhage  Essential hypertension  History of hypercholesterolemia  Sick sinus syndrome (HCC)  History of IBS  History of asthma  Anxiety and depression  History of gastroesophageal reflux (GERD)   Orders: Orders Placed This Encounter  Procedures  . CK  . TSH  . Vitamin B12  . VITAMIN D 25 Hydroxy (Vit-D Deficiency, Fractures)  . CBC with Differential/Platelet  . COMPLETE METABOLIC PANEL WITH GFR  . ANA  . Sedimentation rate   No orders of the defined types were placed in this encounter.     Follow-Up Instructions: Return in about 6 months (around 04/11/2020) for Fibromyalgia, DDD.   Ofilia Neas, PA-C  Note - This record has been created using Dragon software.  Chart creation errors have been sought, but may not always  have been located. Such creation errors do not reflect on  the standard of medical care.

## 2019-10-10 ENCOUNTER — Other Ambulatory Visit: Payer: Self-pay

## 2019-10-10 ENCOUNTER — Ambulatory Visit (INDEPENDENT_AMBULATORY_CARE_PROVIDER_SITE_OTHER): Payer: Medicare Other | Admitting: Physician Assistant

## 2019-10-10 ENCOUNTER — Encounter: Payer: Self-pay | Admitting: Physician Assistant

## 2019-10-10 VITALS — BP 125/82 | HR 76 | Resp 15 | Ht 67.0 in | Wt 171.0 lb

## 2019-10-10 DIAGNOSIS — M5136 Other intervertebral disc degeneration, lumbar region: Secondary | ICD-10-CM

## 2019-10-10 DIAGNOSIS — M62838 Other muscle spasm: Secondary | ICD-10-CM | POA: Diagnosis not present

## 2019-10-10 DIAGNOSIS — M8589 Other specified disorders of bone density and structure, multiple sites: Secondary | ICD-10-CM

## 2019-10-10 DIAGNOSIS — I10 Essential (primary) hypertension: Secondary | ICD-10-CM

## 2019-10-10 DIAGNOSIS — Z8709 Personal history of other diseases of the respiratory system: Secondary | ICD-10-CM

## 2019-10-10 DIAGNOSIS — F419 Anxiety disorder, unspecified: Secondary | ICD-10-CM

## 2019-10-10 DIAGNOSIS — I495 Sick sinus syndrome: Secondary | ICD-10-CM

## 2019-10-10 DIAGNOSIS — Z8719 Personal history of other diseases of the digestive system: Secondary | ICD-10-CM

## 2019-10-10 DIAGNOSIS — M797 Fibromyalgia: Secondary | ICD-10-CM | POA: Diagnosis not present

## 2019-10-10 DIAGNOSIS — R5383 Other fatigue: Secondary | ICD-10-CM

## 2019-10-10 DIAGNOSIS — L659 Nonscarring hair loss, unspecified: Secondary | ICD-10-CM

## 2019-10-10 DIAGNOSIS — M8580 Other specified disorders of bone density and structure, unspecified site: Secondary | ICD-10-CM

## 2019-10-10 DIAGNOSIS — F5104 Psychophysiologic insomnia: Secondary | ICD-10-CM

## 2019-10-10 DIAGNOSIS — L409 Psoriasis, unspecified: Secondary | ICD-10-CM

## 2019-10-10 DIAGNOSIS — F329 Major depressive disorder, single episode, unspecified: Secondary | ICD-10-CM

## 2019-10-10 DIAGNOSIS — M791 Myalgia, unspecified site: Secondary | ICD-10-CM

## 2019-10-10 DIAGNOSIS — E559 Vitamin D deficiency, unspecified: Secondary | ICD-10-CM

## 2019-10-10 DIAGNOSIS — F32A Depression, unspecified: Secondary | ICD-10-CM

## 2019-10-10 DIAGNOSIS — Z8639 Personal history of other endocrine, nutritional and metabolic disease: Secondary | ICD-10-CM

## 2019-10-10 DIAGNOSIS — K5730 Diverticulosis of large intestine without perforation or abscess without bleeding: Secondary | ICD-10-CM

## 2019-10-11 LAB — ANA: Anti Nuclear Antibody (ANA): NEGATIVE

## 2019-10-11 LAB — SEDIMENTATION RATE: Sed Rate: 19 mm/h (ref 0–30)

## 2019-10-11 LAB — COMPLETE METABOLIC PANEL WITH GFR
AG Ratio: 1.4 (calc) (ref 1.0–2.5)
ALT: 40 U/L — ABNORMAL HIGH (ref 6–29)
AST: 22 U/L (ref 10–35)
Albumin: 4.3 g/dL (ref 3.6–5.1)
Alkaline phosphatase (APISO): 51 U/L (ref 37–153)
BUN: 8 mg/dL (ref 7–25)
CO2: 30 mmol/L (ref 20–32)
Calcium: 9.7 mg/dL (ref 8.6–10.4)
Chloride: 105 mmol/L (ref 98–110)
Creat: 0.62 mg/dL (ref 0.50–0.99)
GFR, Est African American: 110 mL/min/{1.73_m2} (ref >=60)
GFR, Est Non African American: 95 mL/min/{1.73_m2} (ref >=60)
Globulin: 3 g/dL (calc) (ref 1.9–3.7)
Glucose, Bld: 74 mg/dL (ref 65–99)
Potassium: 4.2 mmol/L (ref 3.5–5.3)
Sodium: 140 mmol/L (ref 135–146)
Total Bilirubin: 0.5 mg/dL (ref 0.2–1.2)
Total Protein: 7.3 g/dL (ref 6.1–8.1)

## 2019-10-11 LAB — VITAMIN B12: Vitamin B-12: 551 pg/mL (ref 200–1100)

## 2019-10-11 LAB — CBC WITH DIFFERENTIAL/PLATELET
Absolute Monocytes: 634 cells/uL (ref 200–950)
Basophils Absolute: 32 cells/uL (ref 0–200)
Basophils Relative: 0.5 %
Eosinophils Absolute: 58 cells/uL (ref 15–500)
Eosinophils Relative: 0.9 %
HCT: 38.4 % (ref 35.0–45.0)
Hemoglobin: 12.7 g/dL (ref 11.7–15.5)
Lymphs Abs: 2547 cells/uL (ref 850–3900)
MCH: 27.3 pg (ref 27.0–33.0)
MCHC: 33.1 g/dL (ref 32.0–36.0)
MCV: 82.4 fL (ref 80.0–100.0)
MPV: 10.2 fL (ref 7.5–12.5)
Monocytes Relative: 9.9 %
Neutro Abs: 3130 cells/uL (ref 1500–7800)
Neutrophils Relative %: 48.9 %
Platelets: 315 10*3/uL (ref 140–400)
RBC: 4.66 10*6/uL (ref 3.80–5.10)
RDW: 12.7 % (ref 11.0–15.0)
Total Lymphocyte: 39.8 %
WBC: 6.4 10*3/uL (ref 3.8–10.8)

## 2019-10-11 LAB — TSH: TSH: 2.01 mIU/L (ref 0.40–4.50)

## 2019-10-11 LAB — VITAMIN D 25 HYDROXY (VIT D DEFICIENCY, FRACTURES): Vit D, 25-Hydroxy: 39 ng/mL (ref 30–100)

## 2019-10-11 LAB — CK: Total CK: 177 U/L — ABNORMAL HIGH (ref 29–143)

## 2019-10-14 NOTE — Progress Notes (Signed)
TSH WNL.  Vitamin B12 WNL.  Vitamin D is WNL.  Please recommend a maintenance dose of vitamin D.   CK is mildly elevated.  Please add aldolase.  CBC WNL.  ALT is borderline elevated but trending down.  ANA negative.  ESR WNL.

## 2019-10-30 ENCOUNTER — Ambulatory Visit (INDEPENDENT_AMBULATORY_CARE_PROVIDER_SITE_OTHER): Payer: Medicare Other | Admitting: Podiatry

## 2019-10-30 ENCOUNTER — Encounter: Payer: Self-pay | Admitting: Podiatry

## 2019-10-30 ENCOUNTER — Other Ambulatory Visit: Payer: Self-pay

## 2019-10-30 VITALS — Temp 96.6°F

## 2019-10-30 DIAGNOSIS — B351 Tinea unguium: Secondary | ICD-10-CM

## 2019-10-30 DIAGNOSIS — M79675 Pain in left toe(s): Secondary | ICD-10-CM | POA: Diagnosis not present

## 2019-10-30 DIAGNOSIS — E119 Type 2 diabetes mellitus without complications: Secondary | ICD-10-CM

## 2019-10-30 DIAGNOSIS — L409 Psoriasis, unspecified: Secondary | ICD-10-CM

## 2019-10-30 DIAGNOSIS — L308 Other specified dermatitis: Secondary | ICD-10-CM | POA: Diagnosis not present

## 2019-10-30 DIAGNOSIS — M79674 Pain in right toe(s): Secondary | ICD-10-CM | POA: Diagnosis not present

## 2019-11-05 ENCOUNTER — Encounter: Payer: Self-pay | Admitting: Podiatry

## 2019-11-05 NOTE — Progress Notes (Signed)
Subjective: [name] presents today preventative diabetic foot care and painful mycotic nails b/l that are difficult to trim. Pain interferes with ambulation. Aggravating factors include wearing enclosed shoe gear. Pain is relieved with periodic professional debridement.  Patient also has h/o psoriasis and eczema and has creams for this.  Today, she is requesting her Desoximetasone 0.25% be rewritten for the ointment formulation as this works better for her. She voices no other pedal complaints on today's visit.  [name], PA is patient's PCP. Last visit was: 09/12/2019.  Past Medical History:  Diagnosis Date  . Allergy   . Anal fissure 02/2012  . Anxiety   . Arthritis   . Asthma   . Bursitis    right hip  . Complication of anesthesia     woke up during a colonscopy, difficult to wake up post-op sometimes , requires lots of blankets to stay warm post op  . Depression   . Diabetes mellitus without complication ([place])    type 2 diet controlled  . Fatty liver    sees dr Collene Mares for  . Fibromyalgia   . Gallstones 02/15/2015  . GERD (gastroesophageal reflux disease)   . Hepatitis A infection 1979  . Hyperlipidemia   . Hypertension   . Irritable bowel syndrome (IBS)   . 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 Psoriasis    left foot  . 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 Tarsal tunnel syndrome of left side      Current Outpatient Medications on File Prior to Visit  Medication Sig Dispense Refill  . ALPRAZolam (XANAX) 0.5 MG tablet Take 1 tablet by mouth as needed.  0  . azelastine (ASTELIN) 0.1 % nasal spray Place 2 sprays into both nostrils 2 (two) times daily. Use in each nostril as directed 30 mL prn  . Azilsartan Medoxomil (EDARBI) 40 MG TABS Take 40 mg by mouth daily.     . beclomethasone (QVAR) 40 MCG/ACT inhaler Inhale 2 puffs into the lungs 2 (two) times daily as needed (for  shortness of breath).     . Blood Glucose Monitoring Suppl (ACCU-CHEK GUIDE) w/Device KIT USE DAILY AS DIRECTED 1 kit 0  . calcipotriene (DOVONOX) 0.005 % cream Apply topically 2 (two) times daily. 120 g 4  . cetirizine (ZYRTEC) 10 MG tablet TAKE 1 TABLET (10 MG TOTAL) BY MOUTH DAILY. 90 tablet 1  . Cholecalciferol (VITAMIN D) 2000 units CAPS Take 1 capsule (2,000 Units total) by mouth daily. 90 capsule 3  . ciprofloxacin (CIPRO) 500 MG tablet Take 500 mg by mouth 2 (two) times daily as needed (for diverticulitis).    . cyclobenzaprine (FLEXERIL) 10 MG tablet Take 10 mg by mouth 3 (three) times daily as needed for muscle spasms.     . diphenoxylate-atropine (LOMOTIL) 2.5-0.025 MG tablet Take 2 tablets by mouth 4 (four) times daily as needed for diarrhea or loose stools.     . docusate sodium (COLACE) 100 MG capsule TAKE 1 CAPSULE (100 MG TOTAL) BY MOUTH DAILY AS NEEDED FOR MILD CONSTIPATION. 30 capsule 0  . escitalopram (LEXAPRO) 20 MG tablet Take 20 mg by mouth daily 90 tablet 3  . ezetimibe (ZETIA) 10 MG tablet Take 1 tablet (10 mg total) by mouth every evening. 90 tablet 3  . FLOVENT HFA 110 MCG/ACT inhaler Take 2 puffs by mouth as needed.  12  . fluticasone (FLONASE) 50 MCG/ACT  nasal spray Place 2 sprays into both nostrils daily. (Patient taking differently: Place 2 sprays into both nostrils as needed. ) 16 g 12  . hydrALAZINE (APRESOLINE) 25 MG tablet Take 25 mg by mouth 2 (two) times daily.    . hydrocortisone 2.5 % cream Place rectally.    Marland Kitchen ketoconazole (NIZORAL) 2 % cream Apply to both feet once daily for 6 weeks. 60 g 1  . LOTEMAX SM 0.38 % GEL Place 1 drop into both eyes as needed.  1  . magic mouthwash w/lidocaine SOLN Take 10 mLs by mouth every 2 (two) hours as needed for mouth pain. (Patient taking differently: Take 10 mLs by mouth as needed for mouth pain. ) 360 mL prn  . meloxicam (MOBIC) 15 MG tablet TAKE 1 TABLET BY MOUTH EVERY DAY (Patient taking differently: as needed. ) 30  tablet 3  . metFORMIN (GLUCOPHAGE) 500 MG tablet TAKE ONE HALF TABLET (250 MG DOSE) BY MOUTH 2 (TWO) TIMES DAILY WITH MEALS.    . metoprolol tartrate (LOPRESSOR) 25 MG tablet Take 0.5 tablets (12.5 mg total) by mouth 2 (two) times daily. 30 tablet 3  . Multiple Vitamin (MULTIVITAMIN) tablet Take 1 tablet by mouth daily.    Marland Kitchen NITROSTAT 0.4 MG SL tablet Place 0.4 mg under the tongue every 5 (five) minutes as needed for chest pain. For chest pain.    Marland Kitchen olopatadine (PATANOL) 0.1 % ophthalmic solution Place 1 drop into both eyes 2 (two) times daily. 5 mL prn  . ondansetron (ZOFRAN ODT) 4 MG disintegrating tablet Take 1 tablet (4 mg total) by mouth every 8 (eight) hours as needed for nausea. 10 tablet 0  . oxyCODONE-acetaminophen (PERCOCET) 10-325 MG tablet Take 1.5 tablets by mouth daily.    Marland Kitchen Peppermint Oil (IBGARD PO) Take by mouth as needed.     Marland Kitchen PROAIR HFA 108 (90 Base) MCG/ACT inhaler INHALE 2 PUFFS 4 TIMES A DAY AS NEEDED (Patient taking differently: INHALE 2 PUFFS 4 TIMES A DAY AS NEEDED FOR SHORTNESS OF BREATH) 8 Inhaler 0  . Probiotic Product (PROBIOTIC DAILY) CAPS Take 1 capsule by mouth daily.     . ranitidine (ZANTAC) 150 MG tablet Take 150 mg by mouth daily as needed for heartburn.     . zolpidem (AMBIEN) 10 MG tablet Take 10 mg by mouth at bedtime as needed for sleep.      No current facility-administered medications on file prior to visit.     Allergies  Allergen Reactions  . Codeine Other (See Comments)    HALLUCINATIONS   . Propoxyphene N-Acetaminophen Other (See Comments)    HALLUCINATIONS  . Sulfonamide Derivatives Hives, Itching and Swelling  . Alka-Seltzer [Aspirin Effervescent] Other (See Comments)    "like she's fading away", jittery   . Norvasc [Amlodipine Besylate] Other (See Comments)    "passed out"  . Red Dye Other (See Comments)    "burning and tingling sensation"  . Singulair [Montelukast Sodium] Other (See Comments)    Increased depression, sadness  .  Bupropion Anxiety and Other (See Comments)    headache  . Buspirone Other (See Comments)    Abdominal bloating and increased intestinal gas  . Iohexol Nausea Only    JITTERY    . Latex Hives and Itching    Objective: Ryleeann Urquiza is a pleasant 64 y.o. y.o. Patient Race: Black or African American [2]  female in NAD. AAO x 3.  Vitals:   10/30/19 1108  Temp: (!) 96.6 F (35.9 C)  Vascular Examination: Neurovascular status unchanged b/l lower extremities. Capillary refill time to digits immediate b/l. Palpable DP pulses b/l. Palpable PT pulses b/l. Pedal hair sparse b/l. Skin temperature gradient within normal limits b/l. No pain with calf compression b/l. Trace edema noted b/l feet.  Dermatological Examination: Pedal skin with normal turgor, texture and tone bilaterally. No open wounds bilaterally. No interdigital macerations bilaterally. Toenails 1-5 b/l elongated, discolored, dystrophic, thickened, crumbly with subungual debris and tenderness to dorsal palpation. Psoriatic plaques noted plantar aspect of both feet left>right. No bulla, no erythema, no edema, no drainage, no flocculence, no wamrth. No signs of secondary bacterial infection.  Musculoskeletal: Normal muscle strength 5/5 to all lower extremity muscle groups bilaterally. No pain crepitus or joint limitation noted with ROM b/l. Hallux valgus with bunion deformity noted b/l lower extremities.  Neurological Examination: Protective sensation intact 5/5 intact bilaterally with 10g monofilament b/l. Vibratory sensation intact b/l. Proprioception intact bilaterally.  Assessment: 1. Pain due to onychomycosis of toenails of both feet   2. Psoriasis   3. Other eczema   4. Controlled type 2 diabetes mellitus without complication, without long-term current use of insulin (HCC)   Plan: -Examined patient. -Toenails 1-5 b/l were debrided in length and girth with sterile nail nippers and dremel without iatrogenic bleeding.   -Patient to continue soft, supportive shoe gear daily. -Patient to report any pedal injuries to medical professional immediately. -New Rx handwritten for Desoximetasone Ointment 0.25%, 30 mg tube, to be applied to both feet bid. Five refills. -Patient/POA to call should there be question/concern in the interim.  Return in about 12 weeks (around 01/22/2020).  Marzetta Board, DPM

## 2019-12-25 ENCOUNTER — Ambulatory Visit (INDEPENDENT_AMBULATORY_CARE_PROVIDER_SITE_OTHER): Payer: Medicare Other | Admitting: *Deleted

## 2019-12-25 DIAGNOSIS — I495 Sick sinus syndrome: Secondary | ICD-10-CM

## 2019-12-30 LAB — CUP PACEART REMOTE DEVICE CHECK
Battery Remaining Longevity: 50 mo
Battery Voltage: 2.99 V
Brady Statistic AP VP Percent: 0.05 %
Brady Statistic AP VS Percent: 99.75 %
Brady Statistic AS VP Percent: 0 %
Brady Statistic AS VS Percent: 0.2 %
Brady Statistic RA Percent Paced: 99.79 %
Brady Statistic RV Percent Paced: 0.05 %
Date Time Interrogation Session: 20210726091643
Implantable Lead Implant Date: 20160202
Implantable Lead Implant Date: 20160202
Implantable Lead Location: 753859
Implantable Lead Location: 753860
Implantable Lead Model: 5076
Implantable Lead Model: 5076
Implantable Pulse Generator Implant Date: 20160202
Lead Channel Impedance Value: 418 Ohm
Lead Channel Impedance Value: 437 Ohm
Lead Channel Impedance Value: 513 Ohm
Lead Channel Impedance Value: 551 Ohm
Lead Channel Pacing Threshold Amplitude: 0.625 V
Lead Channel Pacing Threshold Amplitude: 0.75 V
Lead Channel Pacing Threshold Pulse Width: 0.4 ms
Lead Channel Pacing Threshold Pulse Width: 0.4 ms
Lead Channel Sensing Intrinsic Amplitude: 15.75 mV
Lead Channel Sensing Intrinsic Amplitude: 15.75 mV
Lead Channel Sensing Intrinsic Amplitude: 3.75 mV
Lead Channel Sensing Intrinsic Amplitude: 3.75 mV
Lead Channel Setting Pacing Amplitude: 2 V
Lead Channel Setting Pacing Amplitude: 2.5 V
Lead Channel Setting Pacing Pulse Width: 0.4 ms
Lead Channel Setting Sensing Sensitivity: 2.8 mV

## 2020-01-10 NOTE — Progress Notes (Signed)
Remote pacemaker transmission.   

## 2020-01-21 DIAGNOSIS — K580 Irritable bowel syndrome with diarrhea: Secondary | ICD-10-CM | POA: Insufficient documentation

## 2020-01-27 ENCOUNTER — Encounter: Payer: Self-pay | Admitting: Podiatry

## 2020-01-27 ENCOUNTER — Other Ambulatory Visit: Payer: Self-pay

## 2020-01-27 ENCOUNTER — Ambulatory Visit (INDEPENDENT_AMBULATORY_CARE_PROVIDER_SITE_OTHER): Payer: Medicare Other | Admitting: Podiatry

## 2020-01-27 DIAGNOSIS — M79674 Pain in right toe(s): Secondary | ICD-10-CM

## 2020-01-27 DIAGNOSIS — M79675 Pain in left toe(s): Secondary | ICD-10-CM | POA: Diagnosis not present

## 2020-01-27 DIAGNOSIS — L409 Psoriasis, unspecified: Secondary | ICD-10-CM | POA: Diagnosis not present

## 2020-01-27 DIAGNOSIS — E119 Type 2 diabetes mellitus without complications: Secondary | ICD-10-CM

## 2020-01-27 DIAGNOSIS — L308 Other specified dermatitis: Secondary | ICD-10-CM | POA: Diagnosis not present

## 2020-01-27 DIAGNOSIS — B351 Tinea unguium: Secondary | ICD-10-CM | POA: Diagnosis not present

## 2020-01-30 NOTE — Progress Notes (Signed)
Subjective: [name] presents today preventative diabetic foot care and painful mycotic nails b/l that are difficult to trim. Pain interferes with ambulation. Aggravating factors include wearing enclosed shoe gear. Pain is relieved with periodic professional debridement.  Patient also has h/o psoriasis and eczema and has creams for this. States she will be seeing a new Dermatologist for another assessment of her feet.  She states she misplaced her written Rx for Desoximetasone 0.25% I gave her on her last visit. She is requesting a new Rx.   Alicia Mons, PA is patient's PCP. Last visit was: 09/12/2019.  Past Medical History:  Diagnosis Date  . Allergy   . Anal fissure 02/2012  . Anxiety   . Arthritis   . Asthma   . Bursitis    right hip  . Complication of anesthesia     woke up during a colonscopy, difficult to wake up post-op sometimes , requires lots of blankets to stay warm post op  . Depression   . Diabetes mellitus without complication ([place])    type 2 diet controlled  . Fatty liver    sees dr Collene Mares for  . Fibromyalgia   . Gallstones 02/15/2015  . GERD (gastroesophageal reflux disease)   . Hepatitis A infection 1979  . Hyperlipidemia   . Hypertension   . Irritable bowel syndrome (IBS)   . 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 Psoriasis    left foot  . 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 Tarsal tunnel syndrome of left side      Current Outpatient Medications on File Prior to Visit  Medication Sig Dispense Refill  . benzonatate (TESSALON) 100 MG capsule Take by mouth.    . ALPRAZolam (XANAX) 0.5 MG tablet Take 1 tablet by mouth as needed.  0  . azelastine (ASTELIN) 0.1 % nasal spray Place 2 sprays into both nostrils 2 (two) times daily. Use in each nostril as directed 30 mL prn  . Azilsartan Medoxomil (EDARBI) 40 MG TABS Take 40 mg by mouth daily.     . beclomethasone (QVAR) 40  MCG/ACT inhaler Inhale 2 puffs into the lungs 2 (two) times daily as needed (for shortness of breath).     . Blood Glucose Monitoring Suppl (ACCU-CHEK GUIDE) w/Device KIT USE DAILY AS DIRECTED 1 kit 0  . calcipotriene (DOVONOX) 0.005 % cream Apply topically 2 (two) times daily. 120 g 4  . cetirizine (ZYRTEC) 10 MG tablet TAKE 1 TABLET (10 MG TOTAL) BY MOUTH DAILY. 90 tablet 1  . Cholecalciferol (VITAMIN D) 2000 units CAPS Take 1 capsule (2,000 Units total) by mouth daily. 90 capsule 3  . ciprofloxacin (CIPRO) 500 MG tablet Take 500 mg by mouth 2 (two) times daily as needed (for diverticulitis).    . cyclobenzaprine (FLEXERIL) 10 MG tablet Take 10 mg by mouth 3 (three) times daily as needed for muscle spasms.     . diphenoxylate-atropine (LOMOTIL) 2.5-0.025 MG tablet Take 2 tablets by mouth 4 (four) times daily as needed for diarrhea or loose stools.     . docusate sodium (COLACE) 100 MG capsule TAKE 1 CAPSULE (100 MG TOTAL) BY MOUTH DAILY AS NEEDED FOR MILD CONSTIPATION. 30 capsule 0  . escitalopram (LEXAPRO) 20 MG tablet Take 20 mg by mouth daily 90 tablet 3  . ezetimibe (ZETIA) 10 MG tablet Take 1 tablet (10 mg total) by mouth every evening. 90 tablet 3  .  FLOVENT HFA 110 MCG/ACT inhaler Take 2 puffs by mouth as needed.  12  . fluticasone (FLONASE) 50 MCG/ACT nasal spray Place 2 sprays into both nostrils daily. (Patient taking differently: Place 2 sprays into both nostrils as needed. ) 16 g 12  . hydrALAZINE (APRESOLINE) 25 MG tablet Take 25 mg by mouth 2 (two) times daily.    . hydrocortisone 2.5 % cream Place rectally.    Marland Kitchen ketoconazole (NIZORAL) 2 % cream Apply to both feet once daily for 6 weeks. 60 g 1  . LOTEMAX SM 0.38 % GEL Place 1 drop into both eyes as needed.  1  . magic mouthwash w/lidocaine SOLN Take 10 mLs by mouth every 2 (two) hours as needed for mouth pain. (Patient taking differently: Take 10 mLs by mouth as needed for mouth pain. ) 360 mL prn  . meloxicam (MOBIC) 15 MG tablet  TAKE 1 TABLET BY MOUTH EVERY DAY (Patient taking differently: as needed. ) 30 tablet 3  . metFORMIN (GLUCOPHAGE) 500 MG tablet TAKE ONE HALF TABLET (250 MG DOSE) BY MOUTH 2 (TWO) TIMES DAILY WITH MEALS.    . metoprolol tartrate (LOPRESSOR) 25 MG tablet Take 0.5 tablets (12.5 mg total) by mouth 2 (two) times daily. 30 tablet 3  . Multiple Vitamin (MULTIVITAMIN) tablet Take 1 tablet by mouth daily.    Marland Kitchen NITROSTAT 0.4 MG SL tablet Place 0.4 mg under the tongue every 5 (five) minutes as needed for chest pain. For chest pain.    Marland Kitchen olopatadine (PATANOL) 0.1 % ophthalmic solution Place 1 drop into both eyes 2 (two) times daily. 5 mL prn  . ondansetron (ZOFRAN ODT) 4 MG disintegrating tablet Take 1 tablet (4 mg total) by mouth every 8 (eight) hours as needed for nausea. 10 tablet 0  . oxyCODONE-acetaminophen (PERCOCET) 10-325 MG tablet Take 1.5 tablets by mouth daily.    Marland Kitchen Peppermint Oil (IBGARD PO) Take by mouth as needed.     Marland Kitchen PROAIR HFA 108 (90 Base) MCG/ACT inhaler INHALE 2 PUFFS 4 TIMES A DAY AS NEEDED (Patient taking differently: INHALE 2 PUFFS 4 TIMES A DAY AS NEEDED FOR SHORTNESS OF BREATH) 8 Inhaler 0  . Probiotic Product (PROBIOTIC DAILY) CAPS Take 1 capsule by mouth daily.     . ranitidine (ZANTAC) 150 MG tablet Take 150 mg by mouth daily as needed for heartburn.     . zolpidem (AMBIEN) 10 MG tablet Take 10 mg by mouth at bedtime as needed for sleep.      No current facility-administered medications on file prior to visit.     Allergies  Allergen Reactions  . Codeine Other (See Comments)    HALLUCINATIONS   . Propoxyphene N-Acetaminophen Other (See Comments)    HALLUCINATIONS  . Sulfonamide Derivatives Hives, Itching and Swelling  . Alka-Seltzer [Aspirin Effervescent] Other (See Comments)    "like she's fading away", jittery   . Norvasc [Amlodipine Besylate] Other (See Comments)    "passed out"  . Red Dye Other (See Comments)    "burning and tingling sensation"  . Singulair  [Montelukast Sodium] Other (See Comments)    Increased depression, sadness  . Bupropion Anxiety and Other (See Comments)    headache  . Buspirone Other (See Comments)    Abdominal bloating and increased intestinal gas  . Iohexol Nausea Only    JITTERY    . Latex Hives and Itching    Objective: [name] is a pleasant 64 y.o. y.o. Patient Race: Black or African American [2]  female in NAD. AAO x 3.  There were no vitals filed for this visit.  Vascular Examination: Neurovascular status unchanged b/l lower extremities. Capillary refill time to digits immediate b/l. Palpable DP pulses b/l. Palpable PT pulses b/l. Pedal hair sparse b/l. Skin temperature gradient within normal limits b/l. No pain with calf compression b/l. Trace edema noted b/l feet.  Dermatological Examination: Pedal skin with normal turgor, texture and tone bilaterally. No open wounds bilaterally. No interdigital macerations bilaterally. Toenails 1-5 b/l elongated, discolored, dystrophic, thickened, crumbly with subungual debris and tenderness to dorsal palpation. Psoriatic plaques noted plantar aspect of both feet left>right. No bulla, no erythema, no edema, no drainage, no flocculence, no wamrth. No signs of secondary bacterial infection.  Musculoskeletal: Normal muscle strength 5/5 to all lower extremity muscle groups bilaterally. No pain crepitus or joint limitation noted with ROM b/l. Hallux valgus with bunion deformity noted b/l lower extremities.  Neurological Examination: Protective sensation intact 5/5 intact bilaterally with 10g monofilament b/l. Vibratory sensation intact b/l. Proprioception intact bilaterally.  Assessment: 1. Pain due to onychomycosis of toenails of both feet    Plan: -Examined patient. -Nail clippings sent today for fungal smear. -Advised [name] to have Dermatologist's report faxed to me for entry into her medical record. She related understanding. -Toenails 1-5 b/l were debrided in  length and girth with sterile nail nippers and dremel without iatrogenic bleeding.  -Patient to continue soft, supportive shoe gear daily. -Patient to report any pedal injuries to medical professional immediately. -New Rx handwritten for Desoximetasone Ointment 0.25%, 30 mg tube (dispense 3 tubes with six refills), to be applied to both feet bid.  -Patient/POA to call should there be question/concern in the interim.  Return in about 3 months (around 04/28/2020) for diabetic nail trim.  Marzetta Board, DPM

## 2020-03-26 NOTE — Progress Notes (Deleted)
Office Visit Note  Patient: [name]             Date of Birth: 03-27-1956           MRN: [id number]             PCP: [name], [place] Referring: [name], [place] Visit Date: 04/09/2020 Occupation: @GUAROCC @  Subjective:  No chief complaint on file.   History of Present Illness: [name] is a 64 y.o. female ***   Activities of Daily Living:  Patient reports morning stiffness for *** {minute/hour:19697}.   Patient {ACTIONS;DENIES/REPORTS:21021675::"Denies"} nocturnal pain.  Difficulty dressing/grooming: {ACTIONS;DENIES/REPORTS:21021675::"Denies"} Difficulty climbing stairs: {ACTIONS;DENIES/REPORTS:21021675::"Denies"} Difficulty getting out of chair: {ACTIONS;DENIES/REPORTS:21021675::"Denies"} Difficulty using hands for taps, buttons, cutlery, and/or writing: {ACTIONS;DENIES/REPORTS:21021675::"Denies"}  No Rheumatology ROS completed.   PMFS History:  Patient Active Problem List   Diagnosis Date Noted  . Laryngopharyngeal reflux (LPR) 05/24/2018  . TMJ (sprain of temporomandibular joint), initial encounter 05/24/2018  . Aortic atherosclerosis ([place]) 10/18/2017  . Acute coronary syndrome ([place]) 02/09/2017  . Right knee pain 07/04/2016  . Pain in right ankle and joints of right foot 07/04/2016  . Psoriasis 03/10/2015  . Right foot pain 11/13/2014  . Ethmoid sinusitis 11/13/2014  . Abdominal pain, epigastric 11/13/2014  . Vitamin D deficiency 11/13/2014  . 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 Sick sinus syndrome ([place]) 07/08/2014  . Chest pain at rest 07/05/2014  . Chronic rhinitis 10/22/2013  . Fatty liver 10/22/2013  . Interstitial cystitis 10/22/2013  . Hip pain, right 10/22/2013  . Chronic insomnia 09/25/2012  . HTN (hypertension) 09/25/2012  . Hyperlipidemia 09/25/2012  . Hyperlipidemia associated with type 2 diabetes mellitus ([place]) 09/25/2012  . Diabetes mellitus type 2, controlled, without complications ([place]) 90/24/0973  . GERD 12/04/2008  . History of colonic  polyps 12/04/2008  . CONSTIPATION 10/21/2008  . Fibromyalgia 10/21/2008  . LACTOSE INTOLERANCE 11/20/2007  . Chest pain, precordial 11/20/2007  . Lactose intolerance 11/20/2007  . Anxiety and depression 08/02/2007  . Osteoporosis 08/02/2007  . Diverticulosis of large intestine 11/16/2006  . EXTERNAL HEMORRHOIDS 05/12/2005    Past Medical History:  Diagnosis Date  . Allergy   . Anal fissure 02/2012  . Anxiety   . Arthritis   . Asthma   . Bursitis    right hip  . Complication of anesthesia     woke up during a colonscopy, difficult to wake up post-op sometimes , requires lots of blankets to stay warm post op  . Depression   . Diabetes mellitus without complication ([place])    type 2 diet controlled  . Fatty liver    sees dr Collene Mares for  . Fibromyalgia   . Gallstones 02/15/2015  . GERD (gastroesophageal reflux disease)   . Hepatitis A infection 1979  . Hyperlipidemia   . Hypertension   . Irritable bowel syndrome (IBS)   . 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 Psoriasis    left foot  . 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 Tarsal tunnel syndrome of left side     Family History  Problem Relation Age of Onset  . Cancer Mother        liver  . Diabetes Mother   . Hyperlipidemia Mother   . Hypertension Mother   . Stroke Mother   . Cancer Maternal Grandmother        breast  . Anesthesia problems Sister  hard to wake up post-op  . Heart disease Father   . Hyperlipidemia Brother   . HIV/AIDS Brother   . Cancer Maternal Grandfather        colon  . Diabetes Brother   . Cancer Brother        lung  . Cancer Paternal Grandfather        COLON   Past Surgical History:  Procedure Laterality Date  . ABDOMINAL HYSTERECTOMY  09/1994   partial  . APPENDECTOMY    . BILATERAL SALPINGOOPHORECTOMY Bilateral 04/2011  . CARDIAC CATHETERIZATION  10/08/2007  . CESAREAN SECTION     x 1  .  CHOLECYSTECTOMY N/A 02/16/2015   Procedure: LAPAROSCOPIC CHOLECYSTECTOMY WITH INTRAOPERATIVE CHOLANGIOGRAM;  Surgeon: Johnathan Hausen, MD;  Location: [facility];  Service: General;  Laterality: N/A;  . COLONOSCOPY WITH PROPOFOL N/A 08/19/2016   Procedure: COLONOSCOPY WITH PROPOFOL;  Surgeon: Carol Ada, MD;  Location: [facility];  Service: Endoscopy;  Laterality: N/A;  . CYSTOSCOPY W/ URETERAL STENT PLACEMENT  08/04/2009  . EXAMINATION UNDER ANESTHESIA  02/17/2012   Procedure: EXAM UNDER ANESTHESIA;  Surgeon: Pedro Earls, MD;  Location: [facility];  Service: General;  Laterality: N/A;  Exam under anesthesia with lateral internal sphincterotomy  . LAPAROSCOPIC LYSIS INTESTINAL ADHESIONS  08/04/2009  . LEFT HEART CATH AND CORONARY ANGIOGRAPHY N/A 02/10/2017   Procedure: LEFT HEART CATH AND CORONARY ANGIOGRAPHY;  Surgeon: Dixie Dials, MD;  Location: [facility];  Service: Cardiovascular;  Laterality: N/A;  . PERMANENT PACEMAKER INSERTION N/A 07/08/2014   MDT MRI compatible dual chamber pacemaker implanted by Dr Rayann Heman for symptomatic bradycardia  . SHOULDER ARTHROSCOPY W/ ROTATOR CUFF REPAIR Left 1999  . SMALL INTESTINE SURGERY    . SPHINCTEROTOMY  02/17/2012   Procedure: SPHINCTEROTOMY;  Surgeon: Pedro Earls, MD;  Location: [facility];  Service: General;  Laterality: N/A;   Social History   Social History Narrative   Lives alone.  Daughter and granddaughter live in [place], [place]. Pt exercise sometimes.      Immunization History  Administered Date(s) Administered  . Influenza,inj,Quad PF,6+ Mos 03/10/2015, 04/12/2016, 03/24/2017  . Influenza-Unspecified 03/17/2014  . PFIZER SARS-COV-2 Vaccination 10/02/2019, 10/23/2019  . Pneumococcal Polysaccharide-23 03/10/2015  . Td 02/16/2012  . Tdap 02/16/2012     Objective: Vital Signs: There were no vitals taken for this visit.   Physical Exam   Musculoskeletal Exam: ***  CDAI Exam: CDAI Score:  -- Patient Global: --; Provider Global: -- Swollen: --; Tender: -- Joint Exam 04/09/2020   No joint exam has been documented for this visit   There is currently no information documented on the homunculus. Go to the Rheumatology activity and complete the homunculus joint exam.  Investigation: No additional findings.  Imaging: No results found.  Recent Labs: Lab Results  Component Value Date   WBC 6.4 10/10/2019   HGB 12.7 10/10/2019   PLT 315 10/10/2019   NA 140 10/10/2019   K 4.2 10/10/2019   CL 105 10/10/2019   CO2 30 10/10/2019   GLUCOSE 74 10/10/2019   BUN 8 10/10/2019   CREATININE 0.62 10/10/2019   BILITOT 0.5 10/10/2019   ALKPHOS 55 10/18/2017   AST 22 10/10/2019   ALT 40 (H) 10/10/2019   PROT 7.3 10/10/2019   ALBUMIN 4.6 10/18/2017   CALCIUM 9.7 10/10/2019   GFRAA 110 10/10/2019    Speciality Comments: No specialty comments available.  Procedures:  No procedures performed Allergies: Codeine, Propoxyphene n-acetaminophen, Sulfonamide derivatives, Alka-seltzer [  aspirin effervescent], Norvasc [amlodipine besylate], Red dye, Singulair [montelukast sodium], Bupropion, Buspirone, Iohexol, and Latex   Assessment / Plan:     Visit Diagnoses: No diagnosis found.  Orders: No orders of the defined types were placed in this encounter.  No orders of the defined types were placed in this encounter.   Face-to-face time spent with patient was *** minutes. Greater than 50% of time was spent in counseling and coordination of care.  Follow-Up Instructions: No follow-ups on file.   Earnestine Mealing, CMA  Note - This record has been created using Editor, commissioning.  Chart creation errors have been sought, but may not always  have been located. Such creation errors do not reflect on  the standard of medical care.

## 2020-04-09 ENCOUNTER — Ambulatory Visit: Payer: Medicare Other | Admitting: Physician Assistant

## 2020-04-09 DIAGNOSIS — M8589 Other specified disorders of bone density and structure, multiple sites: Secondary | ICD-10-CM

## 2020-04-09 DIAGNOSIS — M5136 Other intervertebral disc degeneration, lumbar region: Secondary | ICD-10-CM

## 2020-04-09 DIAGNOSIS — L659 Nonscarring hair loss, unspecified: Secondary | ICD-10-CM

## 2020-04-09 DIAGNOSIS — Z8719 Personal history of other diseases of the digestive system: Secondary | ICD-10-CM

## 2020-04-09 DIAGNOSIS — E559 Vitamin D deficiency, unspecified: Secondary | ICD-10-CM

## 2020-04-09 DIAGNOSIS — M791 Myalgia, unspecified site: Secondary | ICD-10-CM

## 2020-04-09 DIAGNOSIS — L409 Psoriasis, unspecified: Secondary | ICD-10-CM

## 2020-04-09 DIAGNOSIS — M62838 Other muscle spasm: Secondary | ICD-10-CM

## 2020-04-09 DIAGNOSIS — R5383 Other fatigue: Secondary | ICD-10-CM

## 2020-04-09 DIAGNOSIS — Z8709 Personal history of other diseases of the respiratory system: Secondary | ICD-10-CM

## 2020-04-09 DIAGNOSIS — I495 Sick sinus syndrome: Secondary | ICD-10-CM

## 2020-04-09 DIAGNOSIS — Z8639 Personal history of other endocrine, nutritional and metabolic disease: Secondary | ICD-10-CM

## 2020-04-09 DIAGNOSIS — F419 Anxiety disorder, unspecified: Secondary | ICD-10-CM

## 2020-04-09 DIAGNOSIS — K5730 Diverticulosis of large intestine without perforation or abscess without bleeding: Secondary | ICD-10-CM

## 2020-04-09 DIAGNOSIS — F5104 Psychophysiologic insomnia: Secondary | ICD-10-CM

## 2020-04-09 DIAGNOSIS — M797 Fibromyalgia: Secondary | ICD-10-CM

## 2020-04-09 DIAGNOSIS — I10 Essential (primary) hypertension: Secondary | ICD-10-CM

## 2020-04-17 ENCOUNTER — Ambulatory Visit (INDEPENDENT_AMBULATORY_CARE_PROVIDER_SITE_OTHER): Payer: Medicare Other

## 2020-04-17 DIAGNOSIS — I495 Sick sinus syndrome: Secondary | ICD-10-CM | POA: Diagnosis not present

## 2020-04-17 LAB — CUP PACEART REMOTE DEVICE CHECK
Battery Remaining Longevity: 52 mo
Battery Voltage: 2.99 V
Brady Statistic AP VP Percent: 0.05 %
Brady Statistic AP VS Percent: 99.65 %
Brady Statistic AS VP Percent: 0 %
Brady Statistic AS VS Percent: 0.29 %
Brady Statistic RA Percent Paced: 99.7 %
Brady Statistic RV Percent Paced: 0.06 %
Date Time Interrogation Session: 20211112092009
Implantable Lead Implant Date: 20160202
Implantable Lead Implant Date: 20160202
Implantable Lead Location: 753859
Implantable Lead Location: 753860
Implantable Lead Model: 5076
Implantable Lead Model: 5076
Implantable Pulse Generator Implant Date: 20160202
Lead Channel Impedance Value: 418 Ohm
Lead Channel Impedance Value: 437 Ohm
Lead Channel Impedance Value: 513 Ohm
Lead Channel Impedance Value: 513 Ohm
Lead Channel Pacing Threshold Amplitude: 0.5 V
Lead Channel Pacing Threshold Amplitude: 0.875 V
Lead Channel Pacing Threshold Pulse Width: 0.4 ms
Lead Channel Pacing Threshold Pulse Width: 0.4 ms
Lead Channel Sensing Intrinsic Amplitude: 15.875 mV
Lead Channel Sensing Intrinsic Amplitude: 15.875 mV
Lead Channel Sensing Intrinsic Amplitude: 4 mV
Lead Channel Sensing Intrinsic Amplitude: 4 mV
Lead Channel Setting Pacing Amplitude: 2 V
Lead Channel Setting Pacing Amplitude: 2.5 V
Lead Channel Setting Pacing Pulse Width: 0.4 ms
Lead Channel Setting Sensing Sensitivity: 2.8 mV

## 2020-04-20 NOTE — Progress Notes (Signed)
Remote pacemaker transmission.   

## 2020-05-11 ENCOUNTER — Ambulatory Visit (INDEPENDENT_AMBULATORY_CARE_PROVIDER_SITE_OTHER): Payer: Medicare Other | Admitting: Podiatry

## 2020-05-11 ENCOUNTER — Other Ambulatory Visit: Payer: Self-pay

## 2020-05-11 ENCOUNTER — Encounter: Payer: Self-pay | Admitting: Podiatry

## 2020-05-11 DIAGNOSIS — M79674 Pain in right toe(s): Secondary | ICD-10-CM | POA: Diagnosis not present

## 2020-05-11 DIAGNOSIS — M79675 Pain in left toe(s): Secondary | ICD-10-CM | POA: Diagnosis not present

## 2020-05-11 DIAGNOSIS — B351 Tinea unguium: Secondary | ICD-10-CM | POA: Diagnosis not present

## 2020-05-11 DIAGNOSIS — E119 Type 2 diabetes mellitus without complications: Secondary | ICD-10-CM

## 2020-05-11 DIAGNOSIS — L409 Psoriasis, unspecified: Secondary | ICD-10-CM

## 2020-05-11 DIAGNOSIS — L308 Other specified dermatitis: Secondary | ICD-10-CM

## 2020-05-14 NOTE — Progress Notes (Signed)
Subjective:  Patient ID: [name], female    DOB: 05-Aug-1955,  MRN: [id number]  64 y.o. female presents with preventative diabetic foot care and painful thick toenails that are difficult to trim. Pain interferes with ambulation. Aggravating factors include wearing enclosed shoe gear. Pain is relieved with periodic professional debridement..    Patient states she did see her Dermatologist.and the doctor started her on Clobetasol Propionate Ointment 0.05% twice daily.  PCP: [name], [place] and last visit was: 04/22/2020.  Review of Systems: Negative except as noted in the HPI.  Past Medical History:  Diagnosis Date  . Allergy   . Anal fissure 02/2012  . Anxiety   . Arthritis   . Asthma   . Bursitis    right hip  . Complication of anesthesia     woke up during a colonscopy, difficult to wake up post-op sometimes , requires lots of blankets to stay warm post op  . Depression   . Diabetes mellitus without complication ([place])    type 2 diet controlled  . Fatty liver    sees dr Collene Mares for  . Fibromyalgia   . Gallstones 02/15/2015  . GERD (gastroesophageal reflux disease)   . Hepatitis A infection 1979  . Hyperlipidemia   . Hypertension   . Irritable bowel syndrome (IBS)   . 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 Psoriasis    left foot  . 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 Tarsal tunnel syndrome of left side    Past Surgical History:  Procedure Laterality Date  . ABDOMINAL HYSTERECTOMY  09/1994   partial  . APPENDECTOMY    . BILATERAL SALPINGOOPHORECTOMY Bilateral 04/2011  . CARDIAC CATHETERIZATION  10/08/2007  . CESAREAN SECTION     x 1  . CHOLECYSTECTOMY N/A 02/16/2015   Procedure: LAPAROSCOPIC CHOLECYSTECTOMY WITH INTRAOPERATIVE CHOLANGIOGRAM;  Surgeon: Johnathan Hausen, MD;  Location: [facility];  Service: General;  Laterality: N/A;  . COLONOSCOPY WITH PROPOFOL N/A 08/19/2016   Procedure:  COLONOSCOPY WITH PROPOFOL;  Surgeon: Carol Ada, MD;  Location: [facility];  Service: Endoscopy;  Laterality: N/A;  . CYSTOSCOPY W/ URETERAL STENT PLACEMENT  08/04/2009  . EXAMINATION UNDER ANESTHESIA  02/17/2012   Procedure: EXAM UNDER ANESTHESIA;  Surgeon: Pedro Earls, MD;  Location: [facility];  Service: General;  Laterality: N/A;  Exam under anesthesia with lateral internal sphincterotomy  . LAPAROSCOPIC LYSIS INTESTINAL ADHESIONS  08/04/2009  . LEFT HEART CATH AND CORONARY ANGIOGRAPHY N/A 02/10/2017   Procedure: LEFT HEART CATH AND CORONARY ANGIOGRAPHY;  Surgeon: Dixie Dials, MD;  Location: [facility];  Service: Cardiovascular;  Laterality: N/A;  . PERMANENT PACEMAKER INSERTION N/A 07/08/2014   MDT MRI compatible dual chamber pacemaker implanted by Dr Rayann Heman for symptomatic bradycardia  . SHOULDER ARTHROSCOPY W/ ROTATOR CUFF REPAIR Left 1999  . SMALL INTESTINE SURGERY    . SPHINCTEROTOMY  02/17/2012   Procedure: SPHINCTEROTOMY;  Surgeon: Pedro Earls, MD;  Location: [facility];  Service: General;  Laterality: N/A;   Patient Active Problem List   Diagnosis Date Noted  . Irritable bowel syndrome with diarrhea 01/21/2020  . Laryngopharyngeal reflux (LPR) 05/24/2018  . TMJ (sprain of temporomandibular joint), initial encounter 05/24/2018  . Aortic atherosclerosis ([place]) 10/18/2017  . Acute coronary syndrome ([place]) 02/09/2017  . Right knee pain 07/04/2016  . Pain in right ankle and joints of right foot 07/04/2016  .  Psoriasis 03/10/2015  . Right foot pain 11/13/2014  . Ethmoid sinusitis 11/13/2014  . Abdominal pain, epigastric 11/13/2014  . Vitamin D deficiency 11/13/2014  . 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 Sick sinus syndrome ([place]) 07/08/2014  . Chest pain at rest 07/05/2014  . Chronic rhinitis 10/22/2013  . Fatty liver 10/22/2013  . Interstitial cystitis 10/22/2013  . Hip pain, right 10/22/2013  . Chronic insomnia 09/25/2012  . HTN  (hypertension) 09/25/2012  . Hyperlipidemia 09/25/2012  . Hyperlipidemia associated with type 2 diabetes mellitus ([place]) 09/25/2012  . Diabetes mellitus type 2, controlled, without complications ([place]) 13/01/6577  . GERD 12/04/2008  . History of colonic polyps 12/04/2008  . CONSTIPATION 10/21/2008  . Fibromyalgia 10/21/2008  . LACTOSE INTOLERANCE 11/20/2007  . Chest pain, precordial 11/20/2007  . Lactose intolerance 11/20/2007  . Anxiety and depression 08/02/2007  . Osteoporosis 08/02/2007  . Diverticulosis of large intestine 11/16/2006  . EXTERNAL HEMORRHOIDS 05/12/2005    Current Outpatient Medications:  .  ALPRAZolam (XANAX) 0.5 MG tablet, Take 1 tablet by mouth as needed., Disp: , Rfl: 0 .  azelastine (ASTELIN) 0.1 % nasal spray, Place 2 sprays into both nostrils 2 (two) times daily. Use in each nostril as directed, Disp: 30 mL, Rfl: prn .  Azilsartan Medoxomil (EDARBI) 40 MG TABS, Take 40 mg by mouth daily. , Disp: , Rfl:  .  beclomethasone (QVAR) 40 MCG/ACT inhaler, Inhale 2 puffs into the lungs 2 (two) times daily as needed (for shortness of breath). , Disp: , Rfl:  .  Blood Glucose Monitoring Suppl (ACCU-CHEK GUIDE) w/Device KIT, USE DAILY AS DIRECTED, Disp: 1 kit, Rfl: 0 .  calcipotriene (DOVONOX) 0.005 % cream, Apply topically 2 (two) times daily., Disp: 120 g, Rfl: 4 .  cetirizine (ZYRTEC) 10 MG tablet, TAKE 1 TABLET (10 MG TOTAL) BY MOUTH DAILY., Disp: 90 tablet, Rfl: 1 .  Cholecalciferol (VITAMIN D) 2000 units CAPS, Take 1 capsule (2,000 Units total) by mouth daily., Disp: 90 capsule, Rfl: 3 .  ciprofloxacin (CIPRO) 500 MG tablet, Take 500 mg by mouth 2 (two) times daily as needed (for diverticulitis)., Disp: , Rfl:  .  cyclobenzaprine (FLEXERIL) 10 MG tablet, Take 10 mg by mouth 3 (three) times daily as needed for muscle spasms. , Disp: , Rfl:  .  diphenoxylate-atropine (LOMOTIL) 2.5-0.025 MG tablet, Take 2 tablets by mouth 4 (four) times daily as needed for diarrhea or loose  stools. , Disp: , Rfl:  .  docusate sodium (COLACE) 100 MG capsule, TAKE 1 CAPSULE (100 MG TOTAL) BY MOUTH DAILY AS NEEDED FOR MILD CONSTIPATION., Disp: 30 capsule, Rfl: 0 .  escitalopram (LEXAPRO) 20 MG tablet, Take 20 mg by mouth daily, Disp: 90 tablet, Rfl: 3 .  ezetimibe (ZETIA) 10 MG tablet, Take 1 tablet (10 mg total) by mouth every evening., Disp: 90 tablet, Rfl: 3 .  FLOVENT HFA 110 MCG/ACT inhaler, Take 2 puffs by mouth as needed., Disp: , Rfl: 12 .  fluticasone (FLONASE) 50 MCG/ACT nasal spray, Place 2 sprays into both nostrils daily. (Patient taking differently: Place 2 sprays into both nostrils as needed. ), Disp: 16 g, Rfl: 12 .  hydrALAZINE (APRESOLINE) 25 MG tablet, Take 25 mg by mouth 2 (two) times daily., Disp: , Rfl:  .  hydrocortisone 2.5 % cream, Place rectally., Disp: , Rfl:  .  ketoconazole (NIZORAL) 2 % cream, Apply to both feet once daily for 6 weeks., Disp: 60 g, Rfl: 1 .  LOTEMAX SM 0.38 % GEL, Place 1 drop into  both eyes as needed., Disp: , Rfl: 1 .  magic mouthwash w/lidocaine SOLN, Take 10 mLs by mouth every 2 (two) hours as needed for mouth pain. (Patient taking differently: Take 10 mLs by mouth as needed for mouth pain. ), Disp: 360 mL, Rfl: prn .  meloxicam (MOBIC) 15 MG tablet, TAKE 1 TABLET BY MOUTH EVERY DAY (Patient taking differently: as needed. ), Disp: 30 tablet, Rfl: 3 .  metFORMIN (GLUCOPHAGE) 500 MG tablet, TAKE ONE HALF TABLET (250 MG DOSE) BY MOUTH 2 (TWO) TIMES DAILY WITH MEALS., Disp: , Rfl:  .  metoprolol tartrate (LOPRESSOR) 25 MG tablet, Take 0.5 tablets (12.5 mg total) by mouth 2 (two) times daily., Disp: 30 tablet, Rfl: 3 .  Multiple Vitamin (MULTIVITAMIN) tablet, Take 1 tablet by mouth daily., Disp: , Rfl:  .  NITROSTAT 0.4 MG SL tablet, Place 0.4 mg under the tongue every 5 (five) minutes as needed for chest pain. For chest pain., Disp: , Rfl:  .  olopatadine (PATANOL) 0.1 % ophthalmic solution, Place 1 drop into both eyes 2 (two) times daily.,  Disp: 5 mL, Rfl: prn .  ondansetron (ZOFRAN ODT) 4 MG disintegrating tablet, Take 1 tablet (4 mg total) by mouth every 8 (eight) hours as needed for nausea., Disp: 10 tablet, Rfl: 0 .  oxyCODONE-acetaminophen (PERCOCET) 10-325 MG tablet, Take 1.5 tablets by mouth daily., Disp: , Rfl:  .  Peppermint Oil (IBGARD PO), Take by mouth as needed. , Disp: , Rfl:  .  PROAIR HFA 108 (90 Base) MCG/ACT inhaler, INHALE 2 PUFFS 4 TIMES A DAY AS NEEDED (Patient taking differently: INHALE 2 PUFFS 4 TIMES A DAY AS NEEDED FOR SHORTNESS OF BREATH), Disp: 8 Inhaler, Rfl: 0 .  Probiotic Product (PROBIOTIC DAILY) CAPS, Take 1 capsule by mouth daily. , Disp: , Rfl:  .  ranitidine (ZANTAC) 150 MG tablet, Take 150 mg by mouth daily as needed for heartburn. , Disp: , Rfl:  .  zolpidem (AMBIEN) 10 MG tablet, Take 10 mg by mouth at bedtime as needed for sleep. , Disp: , Rfl:  Allergies  Allergen Reactions  . Codeine Other (See Comments)    HALLUCINATIONS   . Propoxyphene N-Acetaminophen Other (See Comments)    HALLUCINATIONS  . Sulfonamide Derivatives Hives, Itching and Swelling  . Alka-Seltzer [Aspirin Effervescent] Other (See Comments)    "like she's fading away", jittery   . Norvasc [Amlodipine Besylate] Other (See Comments)    "passed out"  . Red Dye Other (See Comments)    "burning and tingling sensation"  . Singulair [Montelukast Sodium] Other (See Comments)    Increased depression, sadness  . Bupropion Anxiety and Other (See Comments)    headache  . Buspirone Other (See Comments)    Abdominal bloating and increased intestinal gas  . Iohexol Nausea Only    JITTERY    . Latex Hives and Itching   Social History   Tobacco Use  Smoking Status Former Smoker  . Packs/day: 0.50  . Years: 37.00  . Pack years: 18.50  . Types: Cigarettes  . Quit date: 06/05/2012  . Years since quitting: 7.9  Smokeless Tobacco Never Used    Objective:  There were no vitals filed for this visit. Constitutional Patient  is a pleasant 64 y.o. African American female in NAD. AAO x 3.  Vascular Capillary refill time to digits immediate b/l. Palpable pedal pulses b/l LE. Pedal hair present. Lower extremity skin temperature gradient within normal limits. No pain with calf compression b/l. Trace  edema noted b/l lower extremities. No cyanosis or clubbing noted.  Neurologic Normal speech. Protective sensation intact 5/5 intact bilaterally with 10g monofilament b/l. Vibratory sensation intact b/l.  Dermatologic Pedal skin with normal turgor, texture and tone bilaterally. No open wounds bilaterally. No interdigital macerations bilaterally. Toenails 1-5 b/l elongated, discolored, dystrophic, thickened, crumbly with subungual debris and tenderness to dorsal palpation. Psoriatic plaques noted plantar aspect of both feet left>right.  Orthopedic: Normal muscle strength 5/5 to all lower extremity muscle groups bilaterally. No pain crepitus or joint limitation noted with ROM b/l. Hallux valgus with bunion deformity noted b/l lower extremities.   No flowsheet data found.     Assessment:   1. Pain due to onychomycosis of toenails of both feet   2. Psoriasis   3. Other eczema   4. Controlled type 2 diabetes mellitus without complication, without long-term current use of insulin ([place])    Plan:  Patient was evaluated and treated and all questions answered.  Onychomycosis with pain -Nails palliatively debridement as below. -Educated on self-care  Procedure: Nail Debridement Rationale: Pain Type of Debridement: manual, sharp debridement. Instrumentation: Nail nipper, rotary burr. Number of Nails: 10  -Examined patient. -Continue diabetic foot care principles. -Patient to continue soft, supportive shoe gear daily. -Toenails 1-5 b/l were debrided in length and girth with sterile nail nippers and dremel without iatrogenic bleeding.  -Patient to report any pedal injuries to medical professional immediately. -Patient/POA to  call should there be question/concern in the interim.  Return in about 3 months (around 08/09/2020) for diabetic foot care.  Marzetta Board, DPM

## 2020-08-10 ENCOUNTER — Ambulatory Visit: Payer: Medicare Other | Admitting: Podiatry

## 2020-08-10 ENCOUNTER — Other Ambulatory Visit: Payer: Self-pay

## 2020-08-10 ENCOUNTER — Ambulatory Visit (INDEPENDENT_AMBULATORY_CARE_PROVIDER_SITE_OTHER): Payer: Medicare Other | Admitting: Podiatry

## 2020-08-10 ENCOUNTER — Encounter: Payer: Self-pay | Admitting: Podiatry

## 2020-08-10 DIAGNOSIS — E119 Type 2 diabetes mellitus without complications: Secondary | ICD-10-CM | POA: Diagnosis not present

## 2020-08-10 DIAGNOSIS — M79674 Pain in right toe(s): Secondary | ICD-10-CM

## 2020-08-10 DIAGNOSIS — K5732 Diverticulitis of large intestine without perforation or abscess without bleeding: Secondary | ICD-10-CM | POA: Insufficient documentation

## 2020-08-10 DIAGNOSIS — R1111 Vomiting without nausea: Secondary | ICD-10-CM | POA: Insufficient documentation

## 2020-08-10 DIAGNOSIS — R1032 Left lower quadrant pain: Secondary | ICD-10-CM | POA: Insufficient documentation

## 2020-08-10 DIAGNOSIS — B351 Tinea unguium: Secondary | ICD-10-CM

## 2020-08-10 DIAGNOSIS — R141 Gas pain: Secondary | ICD-10-CM | POA: Insufficient documentation

## 2020-08-10 DIAGNOSIS — M1990 Unspecified osteoarthritis, unspecified site: Secondary | ICD-10-CM | POA: Insufficient documentation

## 2020-08-10 DIAGNOSIS — R194 Change in bowel habit: Secondary | ICD-10-CM | POA: Insufficient documentation

## 2020-08-10 DIAGNOSIS — R143 Flatulence: Secondary | ICD-10-CM | POA: Insufficient documentation

## 2020-08-10 DIAGNOSIS — M79675 Pain in left toe(s): Secondary | ICD-10-CM | POA: Diagnosis not present

## 2020-08-10 DIAGNOSIS — Z1211 Encounter for screening for malignant neoplasm of colon: Secondary | ICD-10-CM | POA: Insufficient documentation

## 2020-08-10 DIAGNOSIS — L308 Other specified dermatitis: Secondary | ICD-10-CM

## 2020-08-10 DIAGNOSIS — R110 Nausea: Secondary | ICD-10-CM | POA: Insufficient documentation

## 2020-08-10 DIAGNOSIS — L409 Psoriasis, unspecified: Secondary | ICD-10-CM

## 2020-08-10 DIAGNOSIS — K5731 Diverticulosis of large intestine without perforation or abscess with bleeding: Secondary | ICD-10-CM | POA: Insufficient documentation

## 2020-08-10 NOTE — Progress Notes (Signed)
Subjective:  Patient ID: [name], female    DOB: 1955-07-19,  MRN: [id number]  65 y.o. female presents with preventative diabetic foot care and painful thick toenails that are difficult to trim. Pain interferes with ambulation. Aggravating factors include wearing enclosed shoe gear. Pain is relieved with periodic professional debridement..    Patient states she did not check her blood glucose this morning. States last A1c was 6.0%. She states she is having an eczema flare due to stress and depression.  PCP: [name], [place] and last visit was: 05/11/2020.  Review of Systems: Negative except as noted in the HPI.   Allergies  Allergen Reactions  . Codeine Other (See Comments)    HALLUCINATIONS   . Propoxyphene N-Acetaminophen Other (See Comments)    HALLUCINATIONS  . Sulfonamide Derivatives Hives, Itching and Swelling  . Alka-Seltzer [Aspirin Effervescent] Other (See Comments)    "like she's fading away", jittery   . Norvasc [Amlodipine Besylate] Other (See Comments)    "passed out"  . Other Other (See Comments)    Other reaction(s): Unknown Other reaction(s): Unknown Other reaction(s): Unknown  . Red Dye Other (See Comments)    "burning and tingling sensation"  . Singulair [Montelukast Sodium] Other (See Comments)    Increased depression, sadness  . Sulfa Antibiotics Itching  . Bupropion Anxiety and Other (See Comments)    headache  . Buspirone Other (See Comments)    Abdominal bloating and increased intestinal gas  . Iohexol Nausea Only    JITTERY    . Latex Hives and Itching    Objective:  There were no vitals filed for this visit. Constitutional Patient is a pleasant 64 y.o. African American female in NAD. AAO x 3.  Vascular Capillary refill time to digits immediate b/l. Palpable pedal pulses b/l LE. Pedal hair present. Lower extremity skin temperature gradient within normal limits. No pain with calf compression b/l. Trace edema noted b/l lower extremities. No  cyanosis or clubbing noted.  Neurologic Normal speech. Protective sensation intact 5/5 intact bilaterally with 10g monofilament b/l. Vibratory sensation intact b/l.  Dermatologic Pedal skin with normal turgor, texture and tone bilaterally. No open wounds bilaterally. No interdigital macerations bilaterally. Toenails 1-5 b/l elongated, discolored, dystrophic, thickened, crumbly with subungual debris and tenderness to dorsal palpation. Psoriatic plaques noted plantar aspect of both feet left>right. Eczematous pustules noted dorsolateral aspect of left foot in various stages. No signs of cellulitis.   Orthopedic: Normal muscle strength 5/5 to all lower extremity muscle groups bilaterally. No pain crepitus or joint limitation noted with ROM b/l. Hallux valgus with bunion deformity noted b/l lower extremities.    Assessment:   1. Pain due to onychomycosis of toenails of both feet   2. Psoriasis   3. Other eczema   4. Controlled type 2 diabetes mellitus without complication, without long-term current use of insulin ([place])    Plan:  Patient was evaluated and treated and all questions answered.  Onychomycosis with pain -Nails palliatively debridement as below. -Educated on self-care  Procedure: Nail Debridement Rationale: Pain Type of Debridement: manual, sharp debridement. Instrumentation: Nail nipper, rotary burr. Number of Nails: 10  -Examined patient. -Continue diabetic foot care principles. -Patient to continue soft, supportive shoe gear daily. -Toenails 1-5 b/l were debrided in length and girth with sterile nail nippers and dremel without iatrogenic bleeding.  -Patient to report any pedal injuries to medical professional immediately. -Patient/POA to call should there be question/concern in the interim.  Return in about 3 months (around 11/10/2020).  Marzetta Board, DPM

## 2020-08-21 NOTE — Progress Notes (Signed)
Office Visit Note  Patient: [name]             Date of Birth: Oct 10, 1955           MRN: [id number]             PCP: [name], [place] Referring: [name], [place] Visit Date: 09/02/2020 Occupation: @GUAROCC @  Subjective:  Pain in neck.   History of Present Illness: [name] is a 65 y.o. female with history of fibromyalgia and osteoarthritis.  She states she did very well after the trapezius injections couple of years ago.  She has been having increased pain and stiffness in the trapezius region.  She is having neck stiffness.  She would like to have trigger point injections again.  She continues to have some generalized pain and discomfort from fibromyalgia.  She states her insomnia persists and she takes medications for it.  She continues to have some lower back pain.  She had psoriasis-like lesions off and on.  She has some dry skin currently.  Activities of Daily Living:  Patient reports morning stiffness for several hours.   Patient Reports nocturnal pain.  Difficulty dressing/grooming: Reports Difficulty climbing stairs: Reports Difficulty getting out of chair: Reports Difficulty using hands for taps, buttons, cutlery, and/or writing: Reports  Review of Systems  Constitutional: Positive for fatigue.  HENT: Positive for mouth dryness. Negative for mouth sores and nose dryness.   Eyes: Positive for itching and dryness. Negative for pain and visual disturbance.  Respiratory: Negative for cough, hemoptysis, shortness of breath and difficulty breathing.   Cardiovascular: Negative for chest pain, palpitations and swelling in legs/feet.  Gastrointestinal: Negative for abdominal pain, blood in stool, constipation and diarrhea.  Endocrine: Negative for increased urination.  Genitourinary: Negative for painful urination.  Musculoskeletal: Positive for arthralgias, joint pain, myalgias, morning stiffness, muscle tenderness and myalgias. Negative for joint swelling and muscle  weakness.  Skin: Positive for rash. Negative for color change and redness.  Allergic/Immunologic: Negative for susceptible to infections.  Neurological: Negative for dizziness, numbness, headaches, memory loss and weakness.  Hematological: Negative for swollen glands.  Psychiatric/Behavioral: Positive for sleep disturbance. Negative for confusion.    PMFS History:  Patient Active Problem List   Diagnosis Date Noted  . Abdominal pain, LLQ 08/10/2020  . Arthritis 08/10/2020  . Change in bowel habit 08/10/2020  . Colon cancer screening 08/10/2020  . Diverticulitis of colon 08/10/2020  . Flatulence, eructation and gas pain 08/10/2020  . Hemorrhage of colon due to diverticulosis 08/10/2020  . Nausea 08/10/2020  . Vomiting without nausea 08/10/2020  . Irritable bowel syndrome with diarrhea 01/21/2020  . Laryngopharyngeal reflux (LPR) 05/24/2018  . TMJ (sprain of temporomandibular joint), initial encounter 05/24/2018  . Aortic atherosclerosis ([place]) 10/18/2017  . Acute coronary syndrome ([place]) 02/09/2017  . Right knee pain 07/04/2016  . Pain in right ankle and joints of right foot 07/04/2016  . Psoriasis 03/10/2015  . Right foot pain 11/13/2014  . Ethmoid sinusitis 11/13/2014  . Abdominal pain, epigastric 11/13/2014  . Vitamin D deficiency 11/13/2014  . 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 Sick sinus syndrome ([place]) 07/08/2014  . Chest pain at rest 07/05/2014  . Chronic rhinitis 10/22/2013  . Fatty liver 10/22/2013  . Interstitial cystitis 10/22/2013  . Hip pain, right 10/22/2013  . Chronic insomnia 09/25/2012  . HTN (hypertension) 09/25/2012  . Hyperlipidemia 09/25/2012  . Hyperlipidemia associated with type 2 diabetes mellitus ([place]) 09/25/2012  . Diabetes mellitus type 2, controlled, without complications ([place]) 51/76/1607  .  GERD 12/04/2008  . History of colonic polyps 12/04/2008  . CONSTIPATION 10/21/2008  . Fibromyalgia 10/21/2008  . LACTOSE INTOLERANCE 11/20/2007  . Chest pain,  precordial 11/20/2007  . Lactose intolerance 11/20/2007  . Anxiety and depression 08/02/2007  . Osteoporosis 08/02/2007  . Recurrent major depressive disorder, in remission ([place]) 08/02/2007  . Diverticulosis of large intestine 11/16/2006  . EXTERNAL HEMORRHOIDS 05/12/2005    Past Medical History:  Diagnosis Date  . Allergy   . Anal fissure 02/2012  . Anxiety   . Arthritis   . Asthma   . Bursitis    right hip  . Complication of anesthesia     woke up during a colonscopy, difficult to wake up post-op sometimes , requires lots of blankets to stay warm post op  . Depression   . Diabetes mellitus without complication ([place])    type 2 diet controlled  . Fatty liver    sees dr Collene Mares for  . Fibromyalgia   . Gallstones 02/15/2015  . GERD (gastroesophageal reflux disease)   . Hepatitis A infection 1979  . Hyperlipidemia   . Hypertension   . Irritable bowel syndrome (IBS)   . 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 Psoriasis    left foot  . 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 Tarsal tunnel syndrome of left side     Family History  Problem Relation Age of Onset  . Cancer Mother        liver  . Diabetes Mother   . Hyperlipidemia Mother   . Hypertension Mother   . Stroke Mother   . Cancer Maternal Grandmother        breast  . Anesthesia problems Sister        hard to wake up post-op  . Heart disease Father   . Hyperlipidemia Brother   . HIV/AIDS Brother   . Cancer Maternal Grandfather        colon  . Diabetes Brother   . Cancer Brother        lung  . Cancer Paternal Grandfather        COLON   Past Surgical History:  Procedure Laterality Date  . ABDOMINAL HYSTERECTOMY  09/1994   partial  . APPENDECTOMY    . BILATERAL SALPINGOOPHORECTOMY Bilateral 04/2011  . CARDIAC CATHETERIZATION  10/08/2007  . CESAREAN SECTION     x 1  . CHOLECYSTECTOMY N/A 02/16/2015   Procedure: LAPAROSCOPIC  CHOLECYSTECTOMY WITH INTRAOPERATIVE CHOLANGIOGRAM;  Surgeon: Johnathan Hausen, MD;  Location: [facility];  Service: General;  Laterality: N/A;  . COLONOSCOPY WITH PROPOFOL N/A 08/19/2016   Procedure: COLONOSCOPY WITH PROPOFOL;  Surgeon: Carol Ada, MD;  Location: [facility];  Service: Endoscopy;  Laterality: N/A;  . CYSTOSCOPY W/ URETERAL STENT PLACEMENT  08/04/2009  . EXAMINATION UNDER ANESTHESIA  02/17/2012   Procedure: EXAM UNDER ANESTHESIA;  Surgeon: Pedro Earls, MD;  Location: [facility];  Service: General;  Laterality: N/A;  Exam under anesthesia with lateral internal sphincterotomy  . LAPAROSCOPIC LYSIS INTESTINAL ADHESIONS  08/04/2009  . LEFT HEART CATH AND CORONARY ANGIOGRAPHY N/A 02/10/2017   Procedure: LEFT HEART CATH AND CORONARY ANGIOGRAPHY;  Surgeon: Dixie Dials, MD;  Location: [facility];  Service: Cardiovascular;  Laterality: N/A;  . PERMANENT PACEMAKER INSERTION N/A 07/08/2014   MDT MRI compatible dual chamber pacemaker implanted by Dr Rayann Heman for symptomatic bradycardia  . SHOULDER ARTHROSCOPY W/  ROTATOR CUFF REPAIR Left 1999  . SMALL INTESTINE SURGERY    . SPHINCTEROTOMY  02/17/2012   Procedure: SPHINCTEROTOMY;  Surgeon: Pedro Earls, MD;  Location: [facility];  Service: General;  Laterality: N/A;   Social History   Social History Narrative   Lives alone.  Daughter and granddaughter live in [place], [place]. Pt exercise sometimes.      Immunization History  Administered Date(s) Administered  . Influenza,inj,Quad PF,6+ Mos 03/10/2015, 04/12/2016, 03/24/2017  . Influenza-Unspecified 03/17/2014  . PFIZER(Purple Top)SARS-COV-2 Vaccination 10/02/2019, 10/23/2019, 05/12/2020  . Pneumococcal Polysaccharide-23 03/10/2015  . Td 02/16/2012  . Tdap 02/16/2012     Objective: Vital Signs: BP 132/82 (BP Location: Right Arm, Patient Position: Sitting, Cuff Size: Normal)   Pulse 63   Ht 5' 7"  (1.702 m)   Wt 171 lb 9.6 oz (77.8 kg)   BMI 26.88  kg/m    Physical Exam Vitals and nursing note reviewed.  Constitutional:      Appearance: She is well-developed.  HENT:     Head: Normocephalic and atraumatic.  Eyes:     Conjunctiva/sclera: Conjunctivae normal.  Cardiovascular:     Rate and Rhythm: Normal rate and regular rhythm.     Heart sounds: Normal heart sounds.  Pulmonary:     Effort: Pulmonary effort is normal.     Breath sounds: Normal breath sounds.  Abdominal:     General: Bowel sounds are normal.     Palpations: Abdomen is soft.  Musculoskeletal:     Cervical back: Normal range of motion.  Lymphadenopathy:     Cervical: No cervical adenopathy.  Skin:    General: Skin is warm and dry.     Capillary Refill: Capillary refill takes less than 2 seconds.  Neurological:     Mental Status: She is alert and oriented to person, place, and time.  Psychiatric:        Behavior: Behavior normal.      Musculoskeletal Exam: C-spine was in good range of motion.  She had bilateral trapezius spasm.  She is some discomfort range of motion of her lumbar spine.  Shoulder joints, elbow joints, wrist joints with good range of motion.  She had no MCP PIP or DIP thickening.  Hip joints were in good range of motion.  She is some tenderness over right trochanteric bursa.  Knee joints with good range of motion without any warmth swelling or effusion.  There was no tenderness over ankles or MTPs.  CDAI Exam: CDAI Score: -- Patient Global: --; Provider Global: -- Swollen: --; Tender: -- Joint Exam 09/02/2020   No joint exam has been documented for this visit   There is currently no information documented on the homunculus. Go to the Rheumatology activity and complete the homunculus joint exam.  Investigation: No additional findings.  Imaging: No results found.  Recent Labs: Lab Results  Component Value Date   WBC 6.4 10/10/2019   HGB 12.7 10/10/2019   PLT 315 10/10/2019   NA 140 10/10/2019   K 4.2 10/10/2019   CL 105  10/10/2019   CO2 30 10/10/2019   GLUCOSE 74 10/10/2019   BUN 8 10/10/2019   CREATININE 0.62 10/10/2019   BILITOT 0.5 10/10/2019   ALKPHOS 55 10/18/2017   AST 22 10/10/2019   ALT 40 (H) 10/10/2019   PROT 7.3 10/10/2019   ALBUMIN 4.6 10/18/2017   CALCIUM 9.7 10/10/2019   GFRAA 110 10/10/2019    Speciality Comments: No specialty comments available.  Procedures:  Trigger  Point Inj  Date/Time: 09/02/2020 2:06 PM Performed by: Bo Merino, MD Authorized by: Bo Merino, MD   Consent Given by:  Patient Site marked: the procedure site was marked   Timeout: prior to procedure the correct patient, procedure, and site was verified   Indications:  Muscle spasm and pain Total # of Trigger Points:  2 Location: neck   Needle Size:  27 G Approach:  Dorsal Medications #1:  0.5 mL lidocaine 1 %; 10 mg triamcinolone acetonide 40 MG/ML Medications #2:  0.5 mL lidocaine 1 %; 10 mg triamcinolone acetonide 40 MG/ML Patient tolerance:  Patient tolerated the procedure well with no immediate complications   Allergies: Codeine, Propoxyphene n-acetaminophen, Sulfonamide derivatives, Alka-seltzer [aspirin effervescent], Norvasc [amlodipine besylate], Other, Red dye, Singulair [montelukast sodium], Sulfa antibiotics, Bupropion, Buspirone, Iohexol, and Latex   Assessment / Plan:     Visit Diagnoses: Fibromyalgia-she has generalized pain and discomfort.  She has positive tender points.  Need for regular exercise was discussed.  Good sleep hygiene was discussed.  Trapezius muscle spasm-she had bilateral trapezius spasms.  Per her request bilateral trapezius area were injected with cortisone as described above after informed consent was obtained.  She tolerated the procedure well.  Postprocedure instructions were given.  Chronic insomnia -sleep hygiene was discussed.  Ambien 10 mg 1 tablet by mouth at bedtime as needed for insomnia.   Other fatigue-related to fibromyalgia and insomnia.  DDD  (degenerative disc disease), lumbar-core strengthening exercises emphasized.  Weight loss diet and exercise was discussed.  Psoriasis - Plantar aspect of both feet.  Use of topical agents discussed.  Osteopenia of multiple sites - DEXA 07/04/2017 AP spine BMD 0.920 with T score -2.1.  She is on calcium and vitamin D.  Resistive exercises were discussed.  Vitamin D deficiency  Hair loss-she continues to have hair thinning.  Diverticulosis of large intestine without hemorrhage  History of IBS  Diverticulitis of colon - 07/2020.  According to patient she had diverticulitis but was not treated with antibiotics.  Essential hypertension-her blood pressure is controlled today.  History of hypercholesterolemia  Sick sinus syndrome ([facility])  Pacemaker  History of gastroesophageal reflux (GERD)  History of asthma  Anxiety and depression  Orders: Orders Placed This Encounter  Procedures  . Trigger Point Inj   No orders of the defined types were placed in this encounter.     Follow-Up Instructions: Return in about 6 months (around 03/05/2021) for Osteoarthritis.   Bo Merino, MD  Note - This record has been created using Editor, commissioning.  Chart creation errors have been sought, but may not always  have been located. Such creation errors do not reflect on  the standard of medical care.

## 2020-08-31 ENCOUNTER — Telehealth: Payer: Self-pay

## 2020-08-31 NOTE — Telephone Encounter (Signed)
I spoke with the patient and she states she been trying to send a transmission for a month. She states the monitor turns red and give error codes. I conference [employer] on the call to help trouble shoot the monitor. Tech support is sending the patient a new handheld for her monitor.

## 2020-08-31 NOTE — Telephone Encounter (Signed)
Patient would like someone to walk her through sending a transmission. She states that hers is not letting her do it. Please advise.

## 2020-09-02 ENCOUNTER — Ambulatory Visit (INDEPENDENT_AMBULATORY_CARE_PROVIDER_SITE_OTHER): Payer: Medicare Other | Admitting: Rheumatology

## 2020-09-02 ENCOUNTER — Other Ambulatory Visit: Payer: Self-pay

## 2020-09-02 ENCOUNTER — Encounter: Payer: Self-pay | Admitting: Rheumatology

## 2020-09-02 VITALS — BP 132/82 | HR 63 | Ht 67.0 in | Wt 171.6 lb

## 2020-09-02 DIAGNOSIS — M797 Fibromyalgia: Secondary | ICD-10-CM | POA: Diagnosis not present

## 2020-09-02 DIAGNOSIS — M62838 Other muscle spasm: Secondary | ICD-10-CM | POA: Diagnosis not present

## 2020-09-02 DIAGNOSIS — F5104 Psychophysiologic insomnia: Secondary | ICD-10-CM | POA: Diagnosis not present

## 2020-09-02 DIAGNOSIS — L409 Psoriasis, unspecified: Secondary | ICD-10-CM

## 2020-09-02 DIAGNOSIS — M5136 Other intervertebral disc degeneration, lumbar region: Secondary | ICD-10-CM

## 2020-09-02 DIAGNOSIS — F32A Depression, unspecified: Secondary | ICD-10-CM

## 2020-09-02 DIAGNOSIS — Z950 Presence of cardiac pacemaker: Secondary | ICD-10-CM

## 2020-09-02 DIAGNOSIS — K5730 Diverticulosis of large intestine without perforation or abscess without bleeding: Secondary | ICD-10-CM

## 2020-09-02 DIAGNOSIS — M791 Myalgia, unspecified site: Secondary | ICD-10-CM

## 2020-09-02 DIAGNOSIS — I10 Essential (primary) hypertension: Secondary | ICD-10-CM

## 2020-09-02 DIAGNOSIS — I495 Sick sinus syndrome: Secondary | ICD-10-CM

## 2020-09-02 DIAGNOSIS — M8589 Other specified disorders of bone density and structure, multiple sites: Secondary | ICD-10-CM

## 2020-09-02 DIAGNOSIS — Z8719 Personal history of other diseases of the digestive system: Secondary | ICD-10-CM

## 2020-09-02 DIAGNOSIS — Z8709 Personal history of other diseases of the respiratory system: Secondary | ICD-10-CM

## 2020-09-02 DIAGNOSIS — L659 Nonscarring hair loss, unspecified: Secondary | ICD-10-CM

## 2020-09-02 DIAGNOSIS — R5383 Other fatigue: Secondary | ICD-10-CM | POA: Diagnosis not present

## 2020-09-02 DIAGNOSIS — K5732 Diverticulitis of large intestine without perforation or abscess without bleeding: Secondary | ICD-10-CM

## 2020-09-02 DIAGNOSIS — Z8639 Personal history of other endocrine, nutritional and metabolic disease: Secondary | ICD-10-CM

## 2020-09-02 DIAGNOSIS — F419 Anxiety disorder, unspecified: Secondary | ICD-10-CM

## 2020-09-02 DIAGNOSIS — E559 Vitamin D deficiency, unspecified: Secondary | ICD-10-CM

## 2020-09-02 MED ORDER — LIDOCAINE HCL 1 % IJ SOLN
0.5000 mL | INTRAMUSCULAR | Status: AC | PRN
  Administered 2020-09-02: .5 mL

## 2020-09-02 MED ORDER — TRIAMCINOLONE ACETONIDE 40 MG/ML IJ SUSP
10.0000 mg | INTRAMUSCULAR | Status: AC | PRN
  Administered 2020-09-02: 10 mg via INTRAMUSCULAR

## 2020-09-02 MED ORDER — TRIAMCINOLONE ACETONIDE 40 MG/ML IJ SUSP
10.0000 mg | INTRAMUSCULAR | Status: AC | PRN
Start: 2020-09-02 — End: 2020-09-02
  Administered 2020-09-02: 10 mg via INTRAMUSCULAR

## 2020-09-07 ENCOUNTER — Ambulatory Visit (INDEPENDENT_AMBULATORY_CARE_PROVIDER_SITE_OTHER): Payer: Medicare Other

## 2020-09-07 DIAGNOSIS — I495 Sick sinus syndrome: Secondary | ICD-10-CM | POA: Diagnosis not present

## 2020-09-09 LAB — CUP PACEART REMOTE DEVICE CHECK
Battery Remaining Longevity: 47 mo
Battery Voltage: 2.98 V
Brady Statistic AP VP Percent: 0.06 %
Brady Statistic AP VS Percent: 99.85 %
Brady Statistic AS VP Percent: 0 %
Brady Statistic AS VS Percent: 0.09 %
Brady Statistic RA Percent Paced: 99.91 %
Brady Statistic RV Percent Paced: 0.06 %
Date Time Interrogation Session: 20220402093846
Implantable Lead Implant Date: 20160202
Implantable Lead Implant Date: 20160202
Implantable Lead Location: 753859
Implantable Lead Location: 753860
Implantable Lead Model: 5076
Implantable Lead Model: 5076
Implantable Pulse Generator Implant Date: 20160202
Lead Channel Impedance Value: 418 Ohm
Lead Channel Impedance Value: 437 Ohm
Lead Channel Impedance Value: 532 Ohm
Lead Channel Impedance Value: 570 Ohm
Lead Channel Pacing Threshold Amplitude: 0.625 V
Lead Channel Pacing Threshold Amplitude: 0.875 V
Lead Channel Pacing Threshold Pulse Width: 0.4 ms
Lead Channel Pacing Threshold Pulse Width: 0.4 ms
Lead Channel Sensing Intrinsic Amplitude: 19.25 mV
Lead Channel Sensing Intrinsic Amplitude: 19.25 mV
Lead Channel Sensing Intrinsic Amplitude: 4.5 mV
Lead Channel Sensing Intrinsic Amplitude: 4.5 mV
Lead Channel Setting Pacing Amplitude: 2 V
Lead Channel Setting Pacing Amplitude: 2.5 V
Lead Channel Setting Pacing Pulse Width: 0.4 ms
Lead Channel Setting Sensing Sensitivity: 2.8 mV

## 2020-09-17 NOTE — Progress Notes (Signed)
Remote pacemaker transmission.   

## 2020-11-24 ENCOUNTER — Encounter: Payer: Self-pay | Admitting: Podiatry

## 2020-11-24 ENCOUNTER — Other Ambulatory Visit: Payer: Self-pay

## 2020-11-24 ENCOUNTER — Ambulatory Visit (INDEPENDENT_AMBULATORY_CARE_PROVIDER_SITE_OTHER): Payer: Medicare Other | Admitting: Podiatry

## 2020-11-24 DIAGNOSIS — M79675 Pain in left toe(s): Secondary | ICD-10-CM

## 2020-11-24 DIAGNOSIS — M79674 Pain in right toe(s): Secondary | ICD-10-CM | POA: Diagnosis not present

## 2020-11-24 DIAGNOSIS — L301 Dyshidrosis [pompholyx]: Secondary | ICD-10-CM | POA: Insufficient documentation

## 2020-11-24 DIAGNOSIS — B351 Tinea unguium: Secondary | ICD-10-CM

## 2020-11-28 NOTE — Progress Notes (Signed)
  Subjective:  Patient ID: [name], female    DOB: [birth year],  MRN: [id number]  65 y.o. female presents preventative diabetic foot care and painful thick toenails that are difficult to trim. Pain interferes with ambulation. Aggravating factors include wearing enclosed shoe gear. Pain is relieved with periodic professional debridement.  Patient states blood glucose was 99 mg/dl today.  She relates no new pedal problems on today's visit. States she has enough of her foot cream for her eczema.  Allergies  Allergen Reactions   Codeine Other (See Comments)    HALLUCINATIONS    Propoxyphene N-Acetaminophen Other (See Comments)    HALLUCINATIONS   Sulfonamide Derivatives Hives, Itching and Swelling   Alka-Seltzer [Aspirin Effervescent] Other (See Comments)    "like she's fading away", jittery    Norvasc [Amlodipine Besylate] Other (See Comments)    "passed out"   Norvasc [Amlodipine]     Other reaction(s): Unknown   Other Other (See Comments)    Other reaction(s): Unknown Other reaction(s): Unknown Other reaction(s): Unknown   Red Dye Other (See Comments)    "burning and tingling sensation"   Singulair [Montelukast Sodium] Other (See Comments)    Increased depression, sadness   Sulfa Antibiotics Itching   Sulfur     Other reaction(s): Unknown   Bupropion Anxiety and Other (See Comments)    headache   Buspirone Other (See Comments)    Abdominal bloating and increased intestinal gas   Iohexol Nausea Only    JITTERY     Latex Hives and Itching    Review of Systems: Negative except as noted in the HPI.   Objective:   Constitutional Pt is a pleasant 65 y.o. African American female WD, WN in NAD. AAO x 3.   Vascular Capillary refill time to digits immediate b/l. Palpable DP pulse(s) b/l lower extremities Palpable PT pulse(s) b/l lower extremities Pedal hair present. Lower extremity skin temperature gradient within normal limits. No pain with calf compression b/l. No edema noted  b/l lower extremities.  Neurologic Protective sensation intact 5/5 intact bilaterally with 10g monofilament b/l.  Dermatologic Pedal skin with normal turgor, texture and tone b/l lower extremities No open wounds b/l lower extremities No interdigital macerations b/l lower extremities Toenails 1-5 b/l elongated, discolored, dystrophic, thickened, crumbly with subungual debris and tenderness to dorsal palpation. Skin eruption consistent with dishydrotic eczema noted plantar aspect of left foot.  Orthopedic: Normal muscle strength 5/5 to all lower extremity muscle groups bilaterally. No pain crepitus or joint limitation noted with ROM b/l. Hallux valgus with bunion deformity noted b/l lower extremities.   Radiographs: None Assessment:   1. Pain due to onychomycosis of toenails of both feet    Plan:   -Examined patient. -Patient to continue soft, supportive shoe gear daily. -Toenails 1-5 b/l were debrided in length and girth with sterile nail nippers and dremel without iatrogenic bleeding.  -Patient to report any pedal injuries to medical professional immediately. -Patient/POA to call should there be question/concern in the interim.  Return in about 3 months (around 02/24/2021).  Marzetta Board, DPM

## 2020-12-08 ENCOUNTER — Ambulatory Visit (INDEPENDENT_AMBULATORY_CARE_PROVIDER_SITE_OTHER): Payer: Medicare Other

## 2020-12-08 DIAGNOSIS — I495 Sick sinus syndrome: Secondary | ICD-10-CM

## 2020-12-09 LAB — CUP PACEART REMOTE DEVICE CHECK
Battery Remaining Longevity: 42 mo
Battery Voltage: 2.97 V
Brady Statistic AP VP Percent: 0.06 %
Brady Statistic AP VS Percent: 99.89 %
Brady Statistic AS VP Percent: 0 %
Brady Statistic AS VS Percent: 0.05 %
Brady Statistic RA Percent Paced: 99.94 %
Brady Statistic RV Percent Paced: 0.06 %
Date Time Interrogation Session: 20220706091453
Implantable Lead Implant Date: 20160202
Implantable Lead Implant Date: 20160202
Implantable Lead Location: 753859
Implantable Lead Location: 753860
Implantable Lead Model: 5076
Implantable Lead Model: 5076
Implantable Pulse Generator Implant Date: 20160202
Lead Channel Impedance Value: 399 Ohm
Lead Channel Impedance Value: 418 Ohm
Lead Channel Impedance Value: 494 Ohm
Lead Channel Impedance Value: 513 Ohm
Lead Channel Pacing Threshold Amplitude: 0.75 V
Lead Channel Pacing Threshold Amplitude: 0.875 V
Lead Channel Pacing Threshold Pulse Width: 0.4 ms
Lead Channel Pacing Threshold Pulse Width: 0.4 ms
Lead Channel Sensing Intrinsic Amplitude: 20.25 mV
Lead Channel Sensing Intrinsic Amplitude: 20.25 mV
Lead Channel Sensing Intrinsic Amplitude: 3.75 mV
Lead Channel Sensing Intrinsic Amplitude: 3.75 mV
Lead Channel Setting Pacing Amplitude: 2 V
Lead Channel Setting Pacing Amplitude: 2.5 V
Lead Channel Setting Pacing Pulse Width: 0.4 ms
Lead Channel Setting Sensing Sensitivity: 2.8 mV

## 2020-12-10 ENCOUNTER — Other Ambulatory Visit (HOSPITAL_COMMUNITY): Payer: Self-pay | Admitting: Gastroenterology

## 2020-12-10 DIAGNOSIS — R1032 Left lower quadrant pain: Secondary | ICD-10-CM

## 2020-12-17 ENCOUNTER — Other Ambulatory Visit: Payer: Self-pay

## 2020-12-17 ENCOUNTER — Ambulatory Visit (HOSPITAL_COMMUNITY)
Admission: RE | Admit: 2020-12-17 | Discharge: 2020-12-17 | Disposition: A | Discharge status: Home / Self Care | Payer: Medicare Other | Source: Ambulatory Visit | Attending: Gastroenterology | Admitting: Gastroenterology

## 2020-12-17 DIAGNOSIS — R1032 Left lower quadrant pain: Secondary | ICD-10-CM | POA: Diagnosis not present

## 2020-12-17 LAB — POCT I-STAT CREATININE: Creatinine, Ser: 0.6 mg/dL (ref 0.44–1.00)

## 2020-12-17 MED ORDER — IOHEXOL 350 MG/ML SOLN
85.0000 mL | Freq: Once | INTRAVENOUS | Status: AC | PRN
  Administered 2020-12-17: 85 mL via INTRAVENOUS

## 2020-12-17 MED ORDER — SODIUM CHLORIDE (PF) 0.9 % IJ SOLN
INTRAMUSCULAR | Status: AC
  Filled 2020-12-17: qty 50

## 2020-12-29 NOTE — Progress Notes (Signed)
Remote pacemaker transmission.   

## 2021-01-17 NOTE — Addendum Note (Signed)
Encounter addended by: Annie Paras on: 01/17/2021 10:58 AM  Actions taken: Letter saved

## 2021-02-17 ENCOUNTER — Other Ambulatory Visit: Payer: Self-pay | Admitting: Cardiovascular Disease

## 2021-02-17 ENCOUNTER — Ambulatory Visit
Admission: RE | Admit: 2021-02-17 | Discharge: 2021-02-17 | Disposition: A | Discharge status: Home / Self Care | Payer: Medicare Other | Source: Ambulatory Visit | Attending: Cardiovascular Disease | Admitting: Cardiovascular Disease

## 2021-02-17 DIAGNOSIS — R059 Cough, unspecified: Secondary | ICD-10-CM

## 2021-02-17 NOTE — Progress Notes (Deleted)
Office Visit Note  Patient: [name]             Date of Birth: March 18, 1956           MRN: [id number]             PCP: [name], [place] Referring: [name], [place] Visit Date: 03/03/2021 Occupation: @GUAROCC @  Subjective:  No chief complaint on file.   History of Present Illness: [name] is a 65 y.o. female ***   Activities of Daily Living:  Patient reports morning stiffness for *** {minute/hour:19697}.   Patient {ACTIONS;DENIES/REPORTS:21021675::"Denies"} nocturnal pain.  Difficulty dressing/grooming: {ACTIONS;DENIES/REPORTS:21021675::"Denies"} Difficulty climbing stairs: {ACTIONS;DENIES/REPORTS:21021675::"Denies"} Difficulty getting out of chair: {ACTIONS;DENIES/REPORTS:21021675::"Denies"} Difficulty using hands for taps, buttons, cutlery, and/or writing: {ACTIONS;DENIES/REPORTS:21021675::"Denies"}  No Rheumatology ROS completed.   PMFS History:  Patient Active Problem List   Diagnosis Date Noted   Dyshidrosis (pompholyx) 11/24/2020   Abdominal pain, LLQ 08/10/2020   Arthritis 08/10/2020   Change in bowel habit 08/10/2020   Colon cancer screening 08/10/2020   Diverticulitis of colon 08/10/2020   Flatulence, eructation and gas pain 08/10/2020   Hemorrhage of colon due to diverticulosis 08/10/2020   Nausea 08/10/2020   Vomiting without nausea 08/10/2020   Irritable bowel syndrome with diarrhea 01/21/2020   Laryngopharyngeal reflux (LPR) 05/24/2018   TMJ (sprain of temporomandibular joint), initial encounter 05/24/2018   Aortic atherosclerosis ([place]) 10/18/2017   Acute coronary syndrome ([place]) 02/09/2017   Right knee pain 07/04/2016   Pain in right ankle and joints of right foot 07/04/2016   Psoriasis 03/10/2015   Right foot pain 11/13/2014   Ethmoid sinusitis 11/13/2014   Abdominal pain, epigastric 11/13/2014   Vitamin D deficiency 11/13/2014   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   Sick sinus syndrome ([place]) 07/08/2014   Chest pain at rest 07/05/2014   Chronic  rhinitis 10/22/2013   Fatty liver 10/22/2013   Interstitial cystitis 10/22/2013   Hip pain, right 10/22/2013   Chronic insomnia 09/25/2012   HTN (hypertension) 09/25/2012   Hyperlipidemia 09/25/2012   Hyperlipidemia associated with type 2 diabetes mellitus ([place]) 09/25/2012   Diabetes mellitus type 2, controlled, without complications ([place]) 56/81/2751   GERD 12/04/2008   History of colonic polyps 12/04/2008   CONSTIPATION 10/21/2008   Fibromyalgia 10/21/2008   LACTOSE INTOLERANCE 11/20/2007   Chest pain, precordial 11/20/2007   Lactose intolerance 11/20/2007   Anxiety and depression 08/02/2007   Osteoporosis 08/02/2007   Recurrent major depressive disorder, in remission ([place]) 08/02/2007   Diverticulosis of large intestine 11/16/2006   EXTERNAL HEMORRHOIDS 05/12/2005    Past Medical History:  Diagnosis Date   Allergy    Anal fissure 02/2012   Anxiety    Arthritis    Asthma    Bursitis    right hip   Complication of anesthesia     woke up during a colonscopy, difficult to wake up post-op sometimes , requires lots of blankets to stay warm post op   Depression    Diabetes mellitus without complication ([place])    type 2 diet controlled   Fatty liver    sees dr Collene Mares for   Fibromyalgia    Gallstones 02/15/2015   GERD (gastroesophageal reflux disease)    Hepatitis A infection 1979   Hyperlipidemia    Hypertension    Irritable bowel syndrome (IBS)    Presence of permanent cardiac pacemaker 07/2014   sees dr allred   Psoriasis    left foot   Symptomatic bradycardia    a. s/p MDT MRI compatible dual  chamber pacemaker 07/2014 Dr Allred   Tarsal tunnel syndrome of left side     Family History  Problem Relation Age of Onset   Cancer Mother        liver   Diabetes Mother    Hyperlipidemia Mother    Hypertension Mother    Stroke Mother    Cancer Maternal Grandmother        breast   Anesthesia problems Sister        hard to wake up post-op   Heart disease Father     Hyperlipidemia Brother    HIV/AIDS Brother    Cancer Maternal Grandfather        colon   Diabetes Brother    Cancer Brother        lung   Cancer Paternal Grandfather        COLON   Past Surgical History:  Procedure Laterality Date   ABDOMINAL HYSTERECTOMY  09/1994   partial   APPENDECTOMY     BILATERAL SALPINGOOPHORECTOMY Bilateral 04/2011   CARDIAC CATHETERIZATION  10/08/2007   CESAREAN SECTION     x 1   CHOLECYSTECTOMY N/A 02/16/2015   Procedure: LAPAROSCOPIC CHOLECYSTECTOMY WITH INTRAOPERATIVE CHOLANGIOGRAM;  Surgeon: Johnathan Hausen, MD;  Location: [facility];  Service: General;  Laterality: N/A;   COLONOSCOPY WITH PROPOFOL N/A 08/19/2016   Procedure: COLONOSCOPY WITH PROPOFOL;  Surgeon: Carol Ada, MD;  [facility];  Service: Endoscopy;  Laterality: N/A;   CYSTOSCOPY W/ URETERAL STENT PLACEMENT  08/04/2009   EXAMINATION UNDER ANESTHESIA  02/17/2012   Procedure: EXAM UNDER ANESTHESIA;  Surgeon: Pedro Earls, MD;  Location: [facility];  Service: General;  Laterality: N/A;  Exam under anesthesia with lateral internal sphincterotomy   LAPAROSCOPIC LYSIS INTESTINAL ADHESIONS  08/04/2009   LEFT HEART CATH AND CORONARY ANGIOGRAPHY N/A 02/10/2017   Procedure: LEFT HEART CATH AND CORONARY ANGIOGRAPHY;  Surgeon: Dixie Dials, MD;  Location: [facility];  Service: Cardiovascular;  Laterality: N/A;   PERMANENT PACEMAKER INSERTION N/A 07/08/2014   MDT MRI compatible dual chamber pacemaker implanted by Dr Rayann Heman for symptomatic bradycardia   SHOULDER ARTHROSCOPY W/ ROTATOR CUFF REPAIR Left 1999   SMALL INTESTINE SURGERY     SPHINCTEROTOMY  02/17/2012   Procedure: SPHINCTEROTOMY;  Surgeon: Pedro Earls, MD;  Location: [facility];  Service: General;  Laterality: N/A;   Social History   Social History Narrative   Lives alone.  Daughter and granddaughter live in [place], [place]. Pt exercise sometimes.      Immunization History  Administered Date(s)  Administered   Influenza,inj,Quad PF,6+ Mos 03/10/2015, 04/12/2016, 03/24/2017   Influenza-Unspecified 03/17/2014   PFIZER(Purple Top)SARS-COV-2 Vaccination 10/02/2019, 10/23/2019, 05/12/2020   Pneumococcal Polysaccharide-23 03/10/2015   Td 02/16/2012   Tdap 02/16/2012     Objective: Vital Signs: There were no vitals taken for this visit.   Physical Exam   Musculoskeletal Exam: ***  CDAI Exam: CDAI Score: -- Patient Global: --; Provider Global: -- Swollen: --; Tender: -- Joint Exam 03/03/2021   No joint exam has been documented for this visit   There is currently no information documented on the homunculus. Go to the Rheumatology activity and complete the homunculus joint exam.  Investigation: No additional findings.  Imaging: DG Chest 2 View  Result Date: 02/17/2021 CLINICAL DATA:  65 year old female with a history of chest pain and cough EXAM: CHEST - 2 VIEW COMPARISON:  01/29/2015 FINDINGS: Cardiomediastinal silhouette unchanged in size and contour. No evidence of central vascular  congestion. No interlobular septal thickening. Unchanged position of left chest wall pacing device. Pleuroparenchymal thickening at the apices with coarsened interstitial markings throughout. No confluent airspace disease. No pneumothorax or pleural effusion. No acute displaced fracture. Degenerative changes of the spine. IMPRESSION: No active cardiopulmonary disease. Electronically Signed   By: Corrie Mckusick D.O.   On: 02/17/2021 14:25    Recent Labs: Lab Results  Component Value Date   WBC 6.4 10/10/2019   HGB 12.7 10/10/2019   PLT 315 10/10/2019   NA 140 10/10/2019   K 4.2 10/10/2019   CL 105 10/10/2019   CO2 30 10/10/2019   GLUCOSE 74 10/10/2019   BUN 8 10/10/2019   CREATININE 0.60 12/17/2020   BILITOT 0.5 10/10/2019   ALKPHOS 55 10/18/2017   AST 22 10/10/2019   ALT 40 (H) 10/10/2019   PROT 7.3 10/10/2019   ALBUMIN 4.6 10/18/2017   CALCIUM 9.7 10/10/2019   GFRAA 110 10/10/2019     Speciality Comments: No specialty comments available.  Procedures:  No procedures performed Allergies: Codeine, Propoxyphene n-acetaminophen, Sulfonamide derivatives, Alka-seltzer [aspirin effervescent], Norvasc [amlodipine besylate], Norvasc [amlodipine], Other, Red dye, Singulair [montelukast sodium], Sulfa antibiotics, Sulfur, Bupropion, Buspirone, Iohexol, and Latex   Assessment / Plan:     Visit Diagnoses: No diagnosis found.  Orders: No orders of the defined types were placed in this encounter.  No orders of the defined types were placed in this encounter.   Face-to-face time spent with patient was *** minutes. Greater than 50% of time was spent in counseling and coordination of care.  Follow-Up Instructions: No follow-ups on file.   Earnestine Mealing, CMA  Note - This record has been created using Editor, commissioning.  Chart creation errors have been sought, but may not always  have been located. Such creation errors do not reflect on  the standard of medical care.

## 2021-03-03 ENCOUNTER — Other Ambulatory Visit: Payer: Self-pay

## 2021-03-03 ENCOUNTER — Ambulatory Visit: Payer: Medicare Other | Admitting: Rheumatology

## 2021-03-03 DIAGNOSIS — K5732 Diverticulitis of large intestine without perforation or abscess without bleeding: Secondary | ICD-10-CM

## 2021-03-03 DIAGNOSIS — F5104 Psychophysiologic insomnia: Secondary | ICD-10-CM

## 2021-03-03 DIAGNOSIS — M5136 Other intervertebral disc degeneration, lumbar region: Secondary | ICD-10-CM

## 2021-03-03 DIAGNOSIS — M8589 Other specified disorders of bone density and structure, multiple sites: Secondary | ICD-10-CM

## 2021-03-03 DIAGNOSIS — I10 Essential (primary) hypertension: Secondary | ICD-10-CM

## 2021-03-03 DIAGNOSIS — Z8719 Personal history of other diseases of the digestive system: Secondary | ICD-10-CM

## 2021-03-03 DIAGNOSIS — E559 Vitamin D deficiency, unspecified: Secondary | ICD-10-CM

## 2021-03-03 DIAGNOSIS — Z950 Presence of cardiac pacemaker: Secondary | ICD-10-CM

## 2021-03-03 DIAGNOSIS — M797 Fibromyalgia: Secondary | ICD-10-CM

## 2021-03-03 DIAGNOSIS — L409 Psoriasis, unspecified: Secondary | ICD-10-CM

## 2021-03-03 DIAGNOSIS — K5730 Diverticulosis of large intestine without perforation or abscess without bleeding: Secondary | ICD-10-CM

## 2021-03-03 DIAGNOSIS — L659 Nonscarring hair loss, unspecified: Secondary | ICD-10-CM

## 2021-03-03 DIAGNOSIS — I495 Sick sinus syndrome: Secondary | ICD-10-CM

## 2021-03-03 DIAGNOSIS — R5383 Other fatigue: Secondary | ICD-10-CM

## 2021-03-03 DIAGNOSIS — Z8709 Personal history of other diseases of the respiratory system: Secondary | ICD-10-CM

## 2021-03-03 DIAGNOSIS — M62838 Other muscle spasm: Secondary | ICD-10-CM

## 2021-03-03 DIAGNOSIS — Z8639 Personal history of other endocrine, nutritional and metabolic disease: Secondary | ICD-10-CM

## 2021-03-03 DIAGNOSIS — F419 Anxiety disorder, unspecified: Secondary | ICD-10-CM

## 2021-03-05 ENCOUNTER — Ambulatory Visit: Payer: Medicare Other | Admitting: Podiatry

## 2021-03-08 ENCOUNTER — Ambulatory Visit (INDEPENDENT_AMBULATORY_CARE_PROVIDER_SITE_OTHER): Payer: Medicare Other

## 2021-03-08 DIAGNOSIS — I495 Sick sinus syndrome: Secondary | ICD-10-CM | POA: Diagnosis not present

## 2021-03-10 LAB — CUP PACEART REMOTE DEVICE CHECK
Battery Remaining Longevity: 45 mo
Battery Voltage: 2.97 V
Brady Statistic AP VP Percent: 0.07 %
Brady Statistic AP VS Percent: 99.89 %
Brady Statistic AS VP Percent: 0 %
Brady Statistic AS VS Percent: 0.04 %
Brady Statistic RA Percent Paced: 99.96 %
Brady Statistic RV Percent Paced: 0.07 %
Date Time Interrogation Session: 20221004110707
Implantable Lead Implant Date: 20160202
Implantable Lead Implant Date: 20160202
Implantable Lead Location: 753859
Implantable Lead Location: 753860
Implantable Lead Model: 5076
Implantable Lead Model: 5076
Implantable Pulse Generator Implant Date: 20160202
Lead Channel Impedance Value: 437 Ohm
Lead Channel Impedance Value: 456 Ohm
Lead Channel Impedance Value: 551 Ohm
Lead Channel Impedance Value: 551 Ohm
Lead Channel Pacing Threshold Amplitude: 0.625 V
Lead Channel Pacing Threshold Amplitude: 0.75 V
Lead Channel Pacing Threshold Pulse Width: 0.4 ms
Lead Channel Pacing Threshold Pulse Width: 0.4 ms
Lead Channel Sensing Intrinsic Amplitude: 18.75 mV
Lead Channel Sensing Intrinsic Amplitude: 18.75 mV
Lead Channel Sensing Intrinsic Amplitude: 3.5 mV
Lead Channel Sensing Intrinsic Amplitude: 3.5 mV
Lead Channel Setting Pacing Amplitude: 2 V
Lead Channel Setting Pacing Amplitude: 2.5 V
Lead Channel Setting Pacing Pulse Width: 0.4 ms
Lead Channel Setting Sensing Sensitivity: 2.8 mV

## 2021-03-16 NOTE — Progress Notes (Signed)
Remote pacemaker transmission.   

## 2021-03-24 ENCOUNTER — Other Ambulatory Visit: Payer: Self-pay

## 2021-03-24 ENCOUNTER — Encounter: Payer: Self-pay | Admitting: Podiatry

## 2021-03-24 ENCOUNTER — Ambulatory Visit (INDEPENDENT_AMBULATORY_CARE_PROVIDER_SITE_OTHER): Payer: Medicare Other | Admitting: Podiatry

## 2021-03-24 DIAGNOSIS — M79675 Pain in left toe(s): Secondary | ICD-10-CM

## 2021-03-24 DIAGNOSIS — B351 Tinea unguium: Secondary | ICD-10-CM | POA: Diagnosis not present

## 2021-03-24 DIAGNOSIS — M79674 Pain in right toe(s): Secondary | ICD-10-CM | POA: Diagnosis not present

## 2021-03-24 DIAGNOSIS — E119 Type 2 diabetes mellitus without complications: Secondary | ICD-10-CM

## 2021-03-30 NOTE — Progress Notes (Signed)
  Subjective:  Patient ID: [name], female    DOB: 03/17/1956,  MRN: [id number]  65 y.o. female presents preventative diabetic foot care and thick, elongated toenails b/l feet which are tender when wearing enclosed shoe gear.  Patient states blood glucose was 102 mg/dl today.    Patient states her psoriasis is under control. She is followed by Dermatology.  PCP is [name], [place] , and last visit was September, 2022.  Allergies  Allergen Reactions   Codeine Other (See Comments)    HALLUCINATIONS    Propoxyphene N-Acetaminophen Other (See Comments)    HALLUCINATIONS   Sulfonamide Derivatives Hives, Itching and Swelling   Alka-Seltzer [Aspirin Effervescent] Other (See Comments)    "like she's fading away", jittery    Norvasc [Amlodipine Besylate] Other (See Comments)    "passed out"   Norvasc [Amlodipine]     Other reaction(s): Unknown   Other Other (See Comments)    Other reaction(s): Unknown Other reaction(s): Unknown Other reaction(s): Unknown   Red Dye Other (See Comments)    "burning and tingling sensation"   Singulair [Montelukast Sodium] Other (See Comments)    Increased depression, sadness   Sulfa Antibiotics Itching   Sulfur     Other reaction(s): Unknown   Bupropion Anxiety and Other (See Comments)    headache   Buspirone Other (See Comments)    Abdominal bloating and increased intestinal gas   Iohexol Nausea Only    JITTERY     Latex Hives and Itching    Review of Systems: Negative except as noted in the HPI.   Objective:  Vascular Examination: Pedal hair present. Vascular status intact b/l with palpable pedal pulses. CFT immediate b/l. No edema. No pain with calf compression b/l. Skin temperature gradient WNL b/l.   Neurological Examination: Protective sensation intact 5/5 intact bilaterally with 10g monofilament b/l.  Dermatological Examination: Skin warm and supple b/l lower extremities. No open wounds b/l lower extremities. No interdigital  macerations b/l lower extremities. Toenails 1-5 bilaterally elongated, discolored, dystrophic, thickened, and crumbly with subungual debris and tenderness to dorsal palpation.  Musculoskeletal Examination: Normal muscle strength 5/5 to all lower extremity muscle groups bilaterally. Hallux valgus with bunion deformity noted b/l lower extremities.  Radiographs: None Assessment:   1. Pain due to onychomycosis of toenails of both feet   2. Controlled type 2 diabetes mellitus without complication, without long-term current use of insulin ([place])    Plan:  -No new findings. No new orders. -Mycotic toenails were debrided in length and girth with sterile nail nippers and dremel without iatrogenic bleeding. -Patient to report any pedal injuries to medical professional immediately. -Patient/POA to call should there be question/concern in the interim.  Return in about 3 months (around 06/24/2021).  Marzetta Board, DPM

## 2021-05-05 ENCOUNTER — Telehealth: Payer: Self-pay

## 2021-05-05 NOTE — Telephone Encounter (Signed)
Patient advised we will work her in on 05/10/2021 at 11:15 am. Patient advised she will have a wait as we are working her in. Patient states she will see if she is going to be able to make it. Patient advised if she is unable to make it to call the office and cancel her appointment. Patient expressed understanding.

## 2021-05-05 NOTE — Telephone Encounter (Signed)
Patient left a voicemail stating she needs an appointment set up ASAP for her "severe fibromyalgia pain across her back."  Patient states the last time she came in for an appointment it took too long.  Patient states she is in pain and needs an injection on time this time.  Please advise.

## 2021-05-07 NOTE — Progress Notes (Deleted)
Office Visit Note  Patient: [name]             Date of Birth: March 01, 1956           MRN: [id number]             PCP: [name], [place] Referring: [name], [place] Visit Date: 05/10/2021 Occupation: @GUAROCC @  Subjective:  No chief complaint on file.   History of Present Illness: [name] is a 65 y.o. female ***   Activities of Daily Living:  Patient reports morning stiffness for *** {minute/hour:19697}.   Patient {ACTIONS;DENIES/REPORTS:21021675::"Denies"} nocturnal pain.  Difficulty dressing/grooming: {ACTIONS;DENIES/REPORTS:21021675::"Denies"} Difficulty climbing stairs: {ACTIONS;DENIES/REPORTS:21021675::"Denies"} Difficulty getting out of chair: {ACTIONS;DENIES/REPORTS:21021675::"Denies"} Difficulty using hands for taps, buttons, cutlery, and/or writing: {ACTIONS;DENIES/REPORTS:21021675::"Denies"}  No Rheumatology ROS completed.   PMFS History:  Patient Active Problem List   Diagnosis Date Noted   Dyshidrosis (pompholyx) 11/24/2020   Abdominal pain, LLQ 08/10/2020   Arthritis 08/10/2020   Change in bowel habit 08/10/2020   Colon cancer screening 08/10/2020   Diverticulitis of colon 08/10/2020   Flatulence, eructation and gas pain 08/10/2020   Hemorrhage of colon due to diverticulosis 08/10/2020   Nausea 08/10/2020   Vomiting without nausea 08/10/2020   Irritable bowel syndrome with diarrhea 01/21/2020   Laryngopharyngeal reflux (LPR) 05/24/2018   TMJ (sprain of temporomandibular joint), initial encounter 05/24/2018   Aortic atherosclerosis ([place]) 10/18/2017   Acute coronary syndrome ([place]) 02/09/2017   Right knee pain 07/04/2016   Pain in right ankle and joints of right foot 07/04/2016   Psoriasis 03/10/2015   Right foot pain 11/13/2014   Ethmoid sinusitis 11/13/2014   Abdominal pain, epigastric 11/13/2014   Vitamin D deficiency 11/13/2014   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) 09/25/2012   Diabetes mellitus type 2, controlled, without complications ([place]) 40/98/1191   GERD 12/04/2008   History of colonic polyps 12/04/2008   CONSTIPATION 10/21/2008   Fibromyalgia 10/21/2008   LACTOSE INTOLERANCE 11/20/2007   Chest pain, precordial 11/20/2007   Lactose intolerance 11/20/2007   Anxiety and depression 08/02/2007   Osteoporosis 08/02/2007   Recurrent major depressive disorder, in remission ([place]) 08/02/2007   Diverticulosis of large intestine 11/16/2006   EXTERNAL HEMORRHOIDS 05/12/2005    Past Medical History:  Diagnosis Date   Allergy    Anal fissure 02/2012   Anxiety    Arthritis    Asthma    Bursitis    right hip   Complication of anesthesia     woke up during a colonscopy, difficult to wake up post-op sometimes , requires lots of blankets to stay warm post op   Depression    Diabetes mellitus without complication ([place])    type 2 diet controlled   Fatty liver    sees dr Collene Mares for   Fibromyalgia    Gallstones 02/15/2015   GERD (gastroesophageal reflux disease)    Hepatitis A infection 1979   Hyperlipidemia    Hypertension    Irritable bowel syndrome (IBS)    Presence of permanent cardiac pacemaker 07/2014   sees dr allred   Psoriasis    left foot   Symptomatic bradycardia  a. s/p MDT MRI compatible dual chamber pacemaker 07/2014 Dr Allred   Tarsal tunnel syndrome of left side     Family History  Problem Relation Age of Onset   Cancer Mother        liver   Diabetes Mother    Hyperlipidemia Mother    Hypertension Mother    Stroke Mother    Cancer Maternal Grandmother        breast   Anesthesia problems Sister        hard to  wake up post-op   Heart disease Father    Hyperlipidemia Brother    HIV/AIDS Brother    Cancer Maternal Grandfather        colon   Diabetes Brother    Cancer Brother        lung   Cancer Paternal Grandfather        COLON   Past Surgical History:  Procedure Laterality Date   ABDOMINAL HYSTERECTOMY  09/1994   partial   APPENDECTOMY     BILATERAL SALPINGOOPHORECTOMY Bilateral 04/2011   CARDIAC CATHETERIZATION  10/08/2007   CESAREAN SECTION     x 1   CHOLECYSTECTOMY N/A 02/16/2015   Procedure: LAPAROSCOPIC CHOLECYSTECTOMY WITH INTRAOPERATIVE CHOLANGIOGRAM;  Surgeon: Johnathan Hausen, MD;  [facility];  Service: General;  Laterality: N/A;   COLONOSCOPY WITH PROPOFOL N/A 08/19/2016   Procedure: COLONOSCOPY WITH PROPOFOL;  Surgeon: Carol Ada, MD;  [facility];  Service: Endoscopy;  Laterality: N/A;   CYSTOSCOPY W/ URETERAL STENT PLACEMENT  08/04/2009   EXAMINATION UNDER ANESTHESIA  02/17/2012   Procedure: EXAM UNDER ANESTHESIA;  Surgeon: Pedro Earls, MD;  Location: [facility];  Service: General;  Laterality: N/A;  Exam under anesthesia with lateral internal sphincterotomy   LAPAROSCOPIC LYSIS INTESTINAL ADHESIONS  08/04/2009   LEFT HEART CATH AND CORONARY ANGIOGRAPHY N/A 02/10/2017   Procedure: LEFT HEART CATH AND CORONARY ANGIOGRAPHY;  Surgeon: Dixie Dials, MD;  Location: [facility];  Service: Cardiovascular;  Laterality: N/A;   PERMANENT PACEMAKER INSERTION N/A 07/08/2014   MDT MRI compatible dual chamber pacemaker implanted by Dr Rayann Heman for symptomatic bradycardia   SHOULDER ARTHROSCOPY W/ ROTATOR CUFF REPAIR Left 1999   SMALL INTESTINE SURGERY     SPHINCTEROTOMY  02/17/2012   Procedure: SPHINCTEROTOMY;  Surgeon: Pedro Earls, MD;  Location: [facility];  Service: General;  Laterality: N/A;   Social History   Social History Narrative   Lives alone.  Daughter and granddaughter live in [place], [place]. Pt exercise sometimes.       Immunization History  Administered Date(s) Administered   Influenza,inj,Quad PF,6+ Mos 03/10/2015, 04/12/2016, 03/24/2017   Influenza-Unspecified 03/17/2014   Pneumococcal Polysaccharide-23 03/10/2015   Td 02/16/2012   Tdap 02/16/2012     Objective: Vital Signs: There were no vitals taken for this visit.   Physical Exam   Musculoskeletal Exam: ***  CDAI Exam: CDAI Score: -- Patient Global: --; Provider Global: -- Swollen: --; Tender: -- Joint Exam 05/10/2021   No joint exam has been documented for this visit   There is currently no information documented on the homunculus. Go to the Rheumatology activity and complete the homunculus joint exam.  Investigation: No additional findings.  Imaging: No results found.  Recent Labs: Lab Results  Component Value Date   WBC 6.4 10/10/2019   HGB 12.7 10/10/2019   PLT 315 10/10/2019   NA 140 10/10/2019   K 4.2 10/10/2019   CL 105 10/10/2019  CO2 30 10/10/2019   GLUCOSE 74 10/10/2019   BUN 8 10/10/2019   CREATININE 0.60 12/17/2020   BILITOT 0.5 10/10/2019   ALKPHOS 55 10/18/2017   AST 22 10/10/2019   ALT 40 (H) 10/10/2019   PROT 7.3 10/10/2019   ALBUMIN 4.6 10/18/2017   CALCIUM 9.7 10/10/2019   GFRAA 110 10/10/2019    Speciality Comments: No specialty comments available.  Procedures:  No procedures performed Allergies: Codeine, Propoxyphene n-acetaminophen, Sulfonamide derivatives, Alka-seltzer [aspirin effervescent], Norvasc [amlodipine besylate], Norvasc [amlodipine], Other, Red dye, Singulair [montelukast sodium], Sulfa antibiotics, Sulfur, Bupropion, Buspirone, Iohexol, and Latex   Assessment / Plan:     Visit Diagnoses: No diagnosis found.  Orders: No orders of the defined types were placed in this encounter.  No orders of the defined types were placed in this encounter.   Face-to-face time spent with patient was *** minutes. Greater than 50% of time was spent in counseling and coordination of  care.  Follow-Up Instructions: No follow-ups on file.   Earnestine Mealing, CMA  Note - This record has been created using Editor, commissioning.  Chart creation errors have been sought, but may not always  have been located. Such creation errors do not reflect on  the standard of medical care.

## 2021-05-10 ENCOUNTER — Ambulatory Visit: Payer: Medicare Other | Admitting: Rheumatology

## 2021-05-10 DIAGNOSIS — I495 Sick sinus syndrome: Secondary | ICD-10-CM

## 2021-05-10 DIAGNOSIS — M797 Fibromyalgia: Secondary | ICD-10-CM

## 2021-05-10 DIAGNOSIS — M8589 Other specified disorders of bone density and structure, multiple sites: Secondary | ICD-10-CM

## 2021-05-10 DIAGNOSIS — F32A Depression, unspecified: Secondary | ICD-10-CM

## 2021-05-10 DIAGNOSIS — Z8639 Personal history of other endocrine, nutritional and metabolic disease: Secondary | ICD-10-CM

## 2021-05-10 DIAGNOSIS — F5104 Psychophysiologic insomnia: Secondary | ICD-10-CM

## 2021-05-10 DIAGNOSIS — M62838 Other muscle spasm: Secondary | ICD-10-CM

## 2021-05-10 DIAGNOSIS — I10 Essential (primary) hypertension: Secondary | ICD-10-CM

## 2021-05-10 DIAGNOSIS — L659 Nonscarring hair loss, unspecified: Secondary | ICD-10-CM

## 2021-05-10 DIAGNOSIS — L409 Psoriasis, unspecified: Secondary | ICD-10-CM

## 2021-05-10 DIAGNOSIS — Z950 Presence of cardiac pacemaker: Secondary | ICD-10-CM

## 2021-05-10 DIAGNOSIS — K5732 Diverticulitis of large intestine without perforation or abscess without bleeding: Secondary | ICD-10-CM

## 2021-05-10 DIAGNOSIS — K5730 Diverticulosis of large intestine without perforation or abscess without bleeding: Secondary | ICD-10-CM

## 2021-05-10 DIAGNOSIS — R5383 Other fatigue: Secondary | ICD-10-CM

## 2021-05-10 DIAGNOSIS — Z8709 Personal history of other diseases of the respiratory system: Secondary | ICD-10-CM

## 2021-05-10 DIAGNOSIS — M5136 Other intervertebral disc degeneration, lumbar region: Secondary | ICD-10-CM

## 2021-05-10 DIAGNOSIS — E559 Vitamin D deficiency, unspecified: Secondary | ICD-10-CM

## 2021-05-10 DIAGNOSIS — Z8719 Personal history of other diseases of the digestive system: Secondary | ICD-10-CM

## 2021-05-10 NOTE — Progress Notes (Signed)
Office Visit Note  Patient: [name]             Date of Birth: March 05, 1956           MRN: [id number]             PCP: [name], [place] Referring: [name], [place] Visit Date: 05/13/2021 Occupation: @GUAROCC @  Subjective:  Pain in multiple joints.   History of Present Illness: [name] is a 65 y.o. female with a history of fibromyalgia, osteoarthritis and degenerative disc disease.  She states she continues to have pain and discomfort all over.  Has been having increased pain and discomfort in her bilateral trapezius region and wants cortisone injection.  She also has some lower back pain.  She states she was going for physical therapy until she developed flu and then stopped going in October.  She wants to get cortisone injection in the trapezius region as its been very helpful in the past.  Activities of Daily Living:  Patient reports morning stiffness for all day. Patient Reports nocturnal pain.  Difficulty dressing/grooming: Denies Difficulty climbing stairs: Reports Difficulty getting out of chair: Denies Difficulty using hands for taps, buttons, cutlery, and/or writing: Denies  Review of Systems  Constitutional:  Positive for fatigue.  HENT:  Negative for mouth sores, mouth dryness and nose dryness.   Eyes:  Positive for redness and itching. Negative for pain and dryness.  Respiratory:  Negative for shortness of breath and difficulty breathing.   Cardiovascular:  Negative for chest pain and palpitations.  Gastrointestinal:  Negative for blood in stool, constipation and diarrhea.  Endocrine: Negative for increased urination.  Genitourinary:  Negative for difficulty urinating.  Musculoskeletal:  Positive for joint pain, joint pain, myalgias, morning stiffness, muscle tenderness and myalgias. Negative for joint swelling.  Skin:  Positive for rash. Negative for color change.  Allergic/Immunologic: Positive for susceptible to infections.  Neurological:  Positive for  numbness. Negative for dizziness, headaches, memory loss and weakness.  Hematological:  Negative for bruising/bleeding tendency.  Psychiatric/Behavioral:  Negative for confusion.    PMFS History:  Patient Active Problem List   Diagnosis Date Noted   Dyshidrosis (pompholyx) 11/24/2020   Abdominal pain, LLQ 08/10/2020   Arthritis 08/10/2020   Change in bowel habit 08/10/2020   Colon cancer screening 08/10/2020   Diverticulitis of colon 08/10/2020   Flatulence, eructation and gas pain 08/10/2020   Hemorrhage of colon due to diverticulosis 08/10/2020   Nausea 08/10/2020   Vomiting without nausea 08/10/2020   Irritable bowel syndrome with diarrhea 01/21/2020   Laryngopharyngeal reflux (LPR) 05/24/2018   TMJ (sprain of temporomandibular joint), initial encounter 05/24/2018   Aortic atherosclerosis ([place]) 10/18/2017   Acute coronary syndrome ([place]) 02/09/2017   Right knee pain 07/04/2016   Pain in right ankle and joints of right foot 07/04/2016   Psoriasis 03/10/2015   Right foot pain 11/13/2014   Ethmoid sinusitis 11/13/2014   Abdominal pain, epigastric 11/13/2014   Vitamin D deficiency 11/13/2014   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   Sick sinus syndrome ([place]) 07/08/2014   Chest pain at rest 07/05/2014   Chronic rhinitis 10/22/2013   Fatty liver 10/22/2013   Interstitial cystitis 10/22/2013   Hip pain, right 10/22/2013   NASH (nonalcoholic steatohepatitis) 10/22/2013   Chronic insomnia 09/25/2012   HTN (hypertension) 09/25/2012   Hyperlipidemia 09/25/2012   Hyperlipidemia associated with type 2 diabetes mellitus ([place]) 09/25/2012   Diabetes mellitus type 2, controlled, without complications ([place]) 56/81/2751   GERD 12/04/2008   History  of colonic polyps 12/04/2008   CONSTIPATION 10/21/2008   Fibromyalgia 10/21/2008   LACTOSE INTOLERANCE 11/20/2007   Chest pain, precordial 11/20/2007   Lactose intolerance 11/20/2007   Anxiety and depression 08/02/2007   Osteoporosis 08/02/2007    Recurrent major depressive disorder, in remission ([place]) 08/02/2007   Diverticulosis of large intestine 11/16/2006   EXTERNAL HEMORRHOIDS 05/12/2005    Past Medical History:  Diagnosis Date   Allergy    Anal fissure 02/2012   Anxiety    Arthritis    Asthma    Bursitis    right hip   Complication of anesthesia     woke up during a colonscopy, difficult to wake up post-op sometimes , requires lots of blankets to stay warm post op   Depression    Diabetes mellitus without complication ([place])    type 2 diet controlled   Fatty liver    sees dr Collene Mares for   Fibromyalgia    Gallstones 02/15/2015   GERD (gastroesophageal reflux disease)    Hepatitis A infection 1979   Hyperlipidemia    Hypertension    Irritable bowel syndrome (IBS)    Presence of permanent cardiac pacemaker 07/2014   sees dr allred   Psoriasis    left foot   Symptomatic bradycardia    a. s/p MDT MRI compatible dual chamber pacemaker 07/2014 Dr Allred   Tarsal tunnel syndrome of left side     Family History  Problem Relation Age of Onset   Cancer Mother        liver   Diabetes Mother    Hyperlipidemia Mother    Hypertension Mother    Stroke Mother    Cancer Maternal Grandmother        breast   Anesthesia problems Sister        hard to wake up post-op   Heart disease Father    Hyperlipidemia Brother    HIV/AIDS Brother    Cancer Maternal Grandfather        colon   Diabetes Brother    Cancer Brother        lung   Cancer Paternal Grandfather        COLON   Past Surgical History:  Procedure Laterality Date   ABDOMINAL HYSTERECTOMY  09/1994   partial   APPENDECTOMY     BILATERAL SALPINGOOPHORECTOMY Bilateral 04/2011   CARDIAC CATHETERIZATION  10/08/2007   CESAREAN SECTION     x 1   CHOLECYSTECTOMY N/A 02/16/2015   Procedure: LAPAROSCOPIC CHOLECYSTECTOMY WITH INTRAOPERATIVE CHOLANGIOGRAM;  Surgeon: Johnathan Hausen, MD;  Location: [facility];  Service: General;  Laterality: N/A;   COLONOSCOPY WITH PROPOFOL N/A  08/19/2016   Procedure: COLONOSCOPY WITH PROPOFOL;  Surgeon: Carol Ada, MD;  Location: [facility];  Service: Endoscopy;  Laterality: N/A;   CYSTOSCOPY W/ URETERAL STENT PLACEMENT  08/04/2009   EXAMINATION UNDER ANESTHESIA  02/17/2012   Procedure: EXAM UNDER ANESTHESIA;  Surgeon: Pedro Earls, MD;  Location: [facility];  Service: General;  Laterality: N/A;  Exam under anesthesia with lateral internal sphincterotomy   LAPAROSCOPIC LYSIS INTESTINAL ADHESIONS  08/04/2009   LEFT HEART CATH AND CORONARY ANGIOGRAPHY N/A 02/10/2017   Procedure: LEFT HEART CATH AND CORONARY ANGIOGRAPHY;  Surgeon: Dixie Dials, MD;  Location: [facility];  Service: Cardiovascular;  Laterality: N/A;   PERMANENT PACEMAKER INSERTION N/A 07/08/2014   MDT MRI compatible dual chamber pacemaker implanted by Dr Rayann Heman for symptomatic bradycardia   SHOULDER ARTHROSCOPY W/ ROTATOR CUFF REPAIR Left 1999  SMALL INTESTINE SURGERY     SPHINCTEROTOMY  02/17/2012   Procedure: SPHINCTEROTOMY;  Surgeon: Pedro Earls, MD;  Location: [facility];  Service: General;  Laterality: N/A;   Social History   Social History Narrative   Lives alone.  Daughter and granddaughter live in [place], [place]. Pt exercise sometimes.      Immunization History  Administered Date(s) Administered   Influenza,inj,Quad PF,6+ Mos 03/10/2015, 04/12/2016, 03/24/2017   Influenza-Unspecified 03/17/2014   Pneumococcal Polysaccharide-23 03/10/2015   Td 02/16/2012   Tdap 02/16/2012     Objective: Vital Signs: BP 113/78 (BP Location: Left Arm, Patient Position: Sitting, Cuff Size: Normal)   Pulse 63   Ht 5' 7"  (1.702 m)   Wt 170 lb 12.8 oz (77.5 kg)   BMI 26.75 kg/m    Physical Exam Vitals and nursing note reviewed.  Constitutional:      Appearance: She is well-developed.  HENT:     Head: Normocephalic and atraumatic.  Eyes:     Conjunctiva/sclera: Conjunctivae normal.  Cardiovascular:     Rate and Rhythm:  Normal rate and regular rhythm.     Heart sounds: Normal heart sounds.  Pulmonary:     Effort: Pulmonary effort is normal.     Breath sounds: Normal breath sounds.  Abdominal:     General: Bowel sounds are normal.     Palpations: Abdomen is soft.  Musculoskeletal:     Cervical back: Normal range of motion.  Lymphadenopathy:     Cervical: No cervical adenopathy.  Skin:    General: Skin is warm and dry.     Capillary Refill: Capillary refill takes less than 2 seconds.  Neurological:     Mental Status: She is alert and oriented to person, place, and time.  Psychiatric:        Behavior: Behavior normal.     Musculoskeletal Exam: C-spine was in good range of motion.  She had bilateral trapezius spasm.  She had discomfort range of motion of her lumbar spine.  Shoulder joints, elbow joints, wrist joints, MCPs PIPs and DIPs with good range of motion with no synovitis.  Hip joints, knee joints, ankles, MTPs and PIPs with good range of motion with no synovitis.  She had generalized hyperalgesia and positive tender points.  CDAI Exam: CDAI Score: -- Patient Global: --; Provider Global: -- Swollen: --; Tender: -- Joint Exam 05/13/2021   No joint exam has been documented for this visit   There is currently no information documented on the homunculus. Go to the Rheumatology activity and complete the homunculus joint exam.  Investigation: No additional findings.  Imaging: No results found.  Recent Labs: Lab Results  Component Value Date   WBC 6.4 10/10/2019   HGB 12.7 10/10/2019   PLT 315 10/10/2019   NA 140 10/10/2019   K 4.2 10/10/2019   CL 105 10/10/2019   CO2 30 10/10/2019   GLUCOSE 74 10/10/2019   BUN 8 10/10/2019   CREATININE 0.60 12/17/2020   BILITOT 0.5 10/10/2019   ALKPHOS 55 10/18/2017   AST 22 10/10/2019   ALT 40 (H) 10/10/2019   PROT 7.3 10/10/2019   ALBUMIN 4.6 10/18/2017   CALCIUM 9.7 10/10/2019   GFRAA 110 10/10/2019    Speciality Comments: No specialty  comments available.  Procedures:  Trigger Point Inj  Date/Time: 05/13/2021 2:19 PM Performed by: Bo Merino, MD Authorized by: Bo Merino, MD   Consent Given by:  Patient Site marked: the procedure site was marked   Timeout:  prior to procedure the correct patient, procedure, and site was verified   Indications:  Muscle spasm and pain Total # of Trigger Points:  2 Location: neck   Needle Size:  27 G Approach:  Dorsal Medications #1:  0.5 mL lidocaine 1 %; 10 mg triamcinolone acetonide 40 MG/ML Medications #2:  0.5 mL lidocaine 1 %; 10 mg triamcinolone acetonide 40 MG/ML Patient tolerance:  Patient tolerated the procedure well with no immediate complications Allergies: Codeine, Propoxyphene n-acetaminophen, Sulfonamide derivatives, Alka-seltzer [aspirin effervescent], Norvasc [amlodipine besylate], Norvasc [amlodipine], Other, Red dye, Singulair [montelukast sodium], Sulfa antibiotics, Sulfur, Bupropion, Buspirone, Iohexol, and Latex   Assessment / Plan:     Visit Diagnoses: Fibromyalgia-she continues to have generalized pain and discomfort.  She had positive tender points and hyperalgesia.  Trapezius muscle spasm-she had bilateral trapezius spasm and requested cortisone injection.  Blood pressure was normal today.  She states her blood glucose has been normal.  After informed consent was obtained and side effects were discussed bilateral trapezius areas were injected with cortisone and lidocaine as described above.  She tolerated the procedure well.  Postprocedure instructions were given.  Chronic insomnia -she takes Ambien for insomnia.  She is unable to sleep without Ambien.  Ambien 10 mg 1 tablet by mouth at bedtime as needed for insomnia.   Other fatigue-due to insomnia and fibromyalgia.  DDD (degenerative disc disease), lumbar-she continues to have some lower back pain and discomfort.  She was going for physical therapy which she stopped due to upper respiratory tract  infection.  She has been working out at home.  Stretching exercises were emphasized.  Psoriasis - Plantar aspect of both feet.  She uses topical agents.  Osteopenia of multiple sites - DEXA 07/04/2017 AP spine BMD 0.920 with T score -2.1.  Use of calcium rich diet and exercise was emphasized.  Vitamin D deficiency-she is taking vitamin D 2000 units daily.  Other medical problems are listed as follows:  Hair loss  Diverticulosis of large intestine without hemorrhage  History of IBS  History of asthma  History of hypercholesterolemia  Diverticulitis of colon - 07/2020.  According to patient she had diverticulitis but was not treated with antibiotics.  Essential hypertension  Pacemaker  Sick sinus syndrome (HCC)  History of gastroesophageal reflux (GERD)  Anxiety and depression  Orders: Orders Placed This Encounter  Procedures   Trigger Point Inj    No orders of the defined types were placed in this encounter.    Follow-Up Instructions: Return in about 6 months (around 11/11/2021) for FMS, OA.   Bo Merino, MD  Note - This record has been created using Editor, commissioning.  Chart creation errors have been sought, but may not always  have been located. Such creation errors do not reflect on  the standard of medical care.

## 2021-05-13 ENCOUNTER — Encounter: Payer: Self-pay | Admitting: Rheumatology

## 2021-05-13 ENCOUNTER — Ambulatory Visit (INDEPENDENT_AMBULATORY_CARE_PROVIDER_SITE_OTHER): Payer: Medicare Other | Admitting: Rheumatology

## 2021-05-13 ENCOUNTER — Other Ambulatory Visit: Payer: Self-pay

## 2021-05-13 VITALS — BP 113/78 | HR 63 | Ht 67.0 in | Wt 170.8 lb

## 2021-05-13 DIAGNOSIS — F419 Anxiety disorder, unspecified: Secondary | ICD-10-CM

## 2021-05-13 DIAGNOSIS — R5383 Other fatigue: Secondary | ICD-10-CM

## 2021-05-13 DIAGNOSIS — M62838 Other muscle spasm: Secondary | ICD-10-CM

## 2021-05-13 DIAGNOSIS — I10 Essential (primary) hypertension: Secondary | ICD-10-CM

## 2021-05-13 DIAGNOSIS — L659 Nonscarring hair loss, unspecified: Secondary | ICD-10-CM

## 2021-05-13 DIAGNOSIS — Z950 Presence of cardiac pacemaker: Secondary | ICD-10-CM

## 2021-05-13 DIAGNOSIS — M797 Fibromyalgia: Secondary | ICD-10-CM | POA: Diagnosis not present

## 2021-05-13 DIAGNOSIS — I495 Sick sinus syndrome: Secondary | ICD-10-CM

## 2021-05-13 DIAGNOSIS — L409 Psoriasis, unspecified: Secondary | ICD-10-CM

## 2021-05-13 DIAGNOSIS — F32A Depression, unspecified: Secondary | ICD-10-CM

## 2021-05-13 DIAGNOSIS — F5104 Psychophysiologic insomnia: Secondary | ICD-10-CM | POA: Diagnosis not present

## 2021-05-13 DIAGNOSIS — Z8709 Personal history of other diseases of the respiratory system: Secondary | ICD-10-CM

## 2021-05-13 DIAGNOSIS — Z8639 Personal history of other endocrine, nutritional and metabolic disease: Secondary | ICD-10-CM

## 2021-05-13 DIAGNOSIS — K5732 Diverticulitis of large intestine without perforation or abscess without bleeding: Secondary | ICD-10-CM

## 2021-05-13 DIAGNOSIS — M5136 Other intervertebral disc degeneration, lumbar region: Secondary | ICD-10-CM

## 2021-05-13 DIAGNOSIS — Z8719 Personal history of other diseases of the digestive system: Secondary | ICD-10-CM

## 2021-05-13 DIAGNOSIS — M8589 Other specified disorders of bone density and structure, multiple sites: Secondary | ICD-10-CM

## 2021-05-13 DIAGNOSIS — E559 Vitamin D deficiency, unspecified: Secondary | ICD-10-CM

## 2021-05-13 DIAGNOSIS — K5730 Diverticulosis of large intestine without perforation or abscess without bleeding: Secondary | ICD-10-CM

## 2021-05-13 MED ORDER — LIDOCAINE HCL 1 % IJ SOLN
0.5000 mL | INTRAMUSCULAR | Status: AC | PRN
  Administered 2021-05-13: .5 mL

## 2021-05-13 MED ORDER — TRIAMCINOLONE ACETONIDE 40 MG/ML IJ SUSP
10.0000 mg | INTRAMUSCULAR | Status: AC | PRN
  Administered 2021-05-13: 10 mg via INTRAMUSCULAR

## 2021-06-08 ENCOUNTER — Ambulatory Visit (INDEPENDENT_AMBULATORY_CARE_PROVIDER_SITE_OTHER): Payer: Medicare Other

## 2021-06-08 DIAGNOSIS — I495 Sick sinus syndrome: Secondary | ICD-10-CM | POA: Diagnosis not present

## 2021-06-09 LAB — CUP PACEART REMOTE DEVICE CHECK
Battery Remaining Longevity: 41 mo
Battery Voltage: 2.97 V
Brady Statistic AP VP Percent: 0.06 %
Brady Statistic AP VS Percent: 99.9 %
Brady Statistic AS VP Percent: 0 %
Brady Statistic AS VS Percent: 0.04 %
Brady Statistic RA Percent Paced: 99.96 %
Brady Statistic RV Percent Paced: 0.06 %
Date Time Interrogation Session: 20230104173902
Implantable Lead Implant Date: 20160202
Implantable Lead Implant Date: 20160202
Implantable Lead Location: 753859
Implantable Lead Location: 753860
Implantable Lead Model: 5076
Implantable Lead Model: 5076
Implantable Pulse Generator Implant Date: 20160202
Lead Channel Impedance Value: 418 Ohm
Lead Channel Impedance Value: 437 Ohm
Lead Channel Impedance Value: 532 Ohm
Lead Channel Impedance Value: 551 Ohm
Lead Channel Pacing Threshold Amplitude: 0.625 V
Lead Channel Pacing Threshold Amplitude: 0.875 V
Lead Channel Pacing Threshold Pulse Width: 0.4 ms
Lead Channel Pacing Threshold Pulse Width: 0.4 ms
Lead Channel Sensing Intrinsic Amplitude: 20.125 mV
Lead Channel Sensing Intrinsic Amplitude: 20.125 mV
Lead Channel Sensing Intrinsic Amplitude: 4.75 mV
Lead Channel Sensing Intrinsic Amplitude: 4.75 mV
Lead Channel Setting Pacing Amplitude: 2 V
Lead Channel Setting Pacing Amplitude: 2.5 V
Lead Channel Setting Pacing Pulse Width: 0.4 ms
Lead Channel Setting Sensing Sensitivity: 2.8 mV

## 2021-06-18 NOTE — Progress Notes (Signed)
Remote pacemaker transmission.   

## 2021-07-07 ENCOUNTER — Ambulatory Visit: Payer: Medicare Other | Admitting: Podiatry

## 2021-07-10 ENCOUNTER — Emergency Department (HOSPITAL_COMMUNITY)
Admission: EM | Admit: 2021-07-10 | Discharge: 2021-07-10 | Disposition: A | Discharge status: Home / Self Care | Payer: Medicare Other | Attending: Emergency Medicine | Admitting: Emergency Medicine

## 2021-07-10 ENCOUNTER — Encounter (HOSPITAL_COMMUNITY): Payer: Self-pay | Admitting: Emergency Medicine

## 2021-07-10 ENCOUNTER — Emergency Department (HOSPITAL_COMMUNITY): Payer: Medicare Other

## 2021-07-10 ENCOUNTER — Other Ambulatory Visit: Payer: Self-pay

## 2021-07-10 DIAGNOSIS — Z91040 Latex allergy status: Secondary | ICD-10-CM | POA: Insufficient documentation

## 2021-07-10 DIAGNOSIS — R110 Nausea: Secondary | ICD-10-CM | POA: Insufficient documentation

## 2021-07-10 DIAGNOSIS — Z7982 Long term (current) use of aspirin: Secondary | ICD-10-CM | POA: Insufficient documentation

## 2021-07-10 DIAGNOSIS — Z20822 Contact with and (suspected) exposure to covid-19: Secondary | ICD-10-CM | POA: Insufficient documentation

## 2021-07-10 DIAGNOSIS — K5792 Diverticulitis of intestine, part unspecified, without perforation or abscess without bleeding: Secondary | ICD-10-CM

## 2021-07-10 DIAGNOSIS — R197 Diarrhea, unspecified: Secondary | ICD-10-CM | POA: Diagnosis not present

## 2021-07-10 DIAGNOSIS — R1084 Generalized abdominal pain: Secondary | ICD-10-CM | POA: Diagnosis not present

## 2021-07-10 DIAGNOSIS — R0789 Other chest pain: Secondary | ICD-10-CM | POA: Insufficient documentation

## 2021-07-10 DIAGNOSIS — Z950 Presence of cardiac pacemaker: Secondary | ICD-10-CM | POA: Insufficient documentation

## 2021-07-10 DIAGNOSIS — R109 Unspecified abdominal pain: Secondary | ICD-10-CM | POA: Diagnosis present

## 2021-07-10 LAB — CBC WITH DIFFERENTIAL/PLATELET
Abs Immature Granulocytes: 0.01 10*3/uL (ref 0.00–0.07)
Basophils Absolute: 0 10*3/uL (ref 0.0–0.1)
Basophils Relative: 0 %
Eosinophils Absolute: 0 10*3/uL (ref 0.0–0.5)
Eosinophils Relative: 0 %
HCT: 42.4 % (ref 36.0–46.0)
Hemoglobin: 13.7 g/dL (ref 12.0–15.0)
Immature Granulocytes: 0 %
Lymphocytes Relative: 24 %
Lymphs Abs: 2 10*3/uL (ref 0.7–4.0)
MCH: 28.1 pg (ref 26.0–34.0)
MCHC: 32.3 g/dL (ref 30.0–36.0)
MCV: 87.1 fL (ref 80.0–100.0)
Monocytes Absolute: 0.9 10*3/uL (ref 0.1–1.0)
Monocytes Relative: 10 %
Neutro Abs: 5.6 10*3/uL (ref 1.7–7.7)
Neutrophils Relative %: 66 %
Platelets: 276 10*3/uL (ref 150–400)
RBC: 4.87 MIL/uL (ref 3.87–5.11)
RDW: 13.2 % (ref 11.5–15.5)
WBC: 8.4 10*3/uL (ref 4.0–10.5)
nRBC: 0 % (ref 0.0–0.2)

## 2021-07-10 LAB — RESP PANEL BY RT-PCR (FLU A&B, COVID) ARPGX2
Influenza A by PCR: NEGATIVE
Influenza B by PCR: NEGATIVE
SARS Coronavirus 2 by RT PCR: NEGATIVE

## 2021-07-10 LAB — COMPREHENSIVE METABOLIC PANEL
ALT: 37 U/L (ref 0–44)
AST: 28 U/L (ref 15–41)
Albumin: 4 g/dL (ref 3.5–5.0)
Alkaline Phosphatase: 44 U/L (ref 38–126)
Anion gap: 9 (ref 5–15)
BUN: 6 mg/dL — ABNORMAL LOW (ref 8–23)
CO2: 24 mmol/L (ref 22–32)
Calcium: 8.9 mg/dL (ref 8.9–10.3)
Chloride: 103 mmol/L (ref 98–111)
Creatinine, Ser: 0.72 mg/dL (ref 0.44–1.00)
GFR, Estimated: >60 mL/min (ref >60)
Glucose, Bld: 135 mg/dL — ABNORMAL HIGH (ref 70–99)
Potassium: 3.5 mmol/L (ref 3.5–5.1)
Sodium: 136 mmol/L (ref 135–145)
Total Bilirubin: 0.6 mg/dL (ref 0.3–1.2)
Total Protein: 7.5 g/dL (ref 6.5–8.1)

## 2021-07-10 LAB — TROPONIN I (HIGH SENSITIVITY)
Troponin I (High Sensitivity): 7 ng/L (ref <18)
Troponin I (High Sensitivity): 8 ng/L (ref <18)

## 2021-07-10 LAB — LIPASE, BLOOD: Lipase: 25 U/L (ref 11–51)

## 2021-07-10 MED ORDER — KETOROLAC TROMETHAMINE 30 MG/ML IJ SOLN
30.0000 mg | Freq: Once | INTRAMUSCULAR | Status: AC
  Administered 2021-07-10: 30 mg via INTRAVENOUS
  Filled 2021-07-10: qty 1

## 2021-07-10 MED ORDER — ALUM & MAG HYDROXIDE-SIMETH 200-200-20 MG/5ML PO SUSP
30.0000 mL | Freq: Once | ORAL | Status: AC
  Administered 2021-07-10: 30 mL via ORAL
  Filled 2021-07-10: qty 30

## 2021-07-10 MED ORDER — LIDOCAINE VISCOUS HCL 2 % MT SOLN
15.0000 mL | Freq: Once | OROMUCOSAL | Status: AC
  Administered 2021-07-10: 15 mL via ORAL
  Filled 2021-07-10: qty 15

## 2021-07-10 MED ORDER — HYDROMORPHONE HCL 1 MG/ML IJ SOLN
1.0000 mg | Freq: Once | INTRAMUSCULAR | Status: AC
  Administered 2021-07-10: 1 mg via INTRAVENOUS
  Filled 2021-07-10: qty 1

## 2021-07-10 MED ORDER — ONDANSETRON 4 MG PO TBDP: 4.0000 mg | ORAL_TABLET | Freq: Three times a day (TID) | ORAL | 0 refills | Status: AC | PRN

## 2021-07-10 MED ORDER — CIPROFLOXACIN HCL 500 MG PO TABS: 500.0000 mg | ORAL_TABLET | Freq: Two times a day (BID) | ORAL | 0 refills | Status: DC

## 2021-07-10 MED ORDER — SODIUM CHLORIDE 0.9 % IV BOLUS
500.0000 mL | Freq: Once | INTRAVENOUS | Status: AC
  Administered 2021-07-10: 500 mL via INTRAVENOUS

## 2021-07-10 MED ORDER — IOHEXOL 300 MG/ML  SOLN
75.0000 mL | Freq: Once | INTRAMUSCULAR | Status: AC | PRN
  Administered 2021-07-10: 100 mL via INTRAVENOUS

## 2021-07-10 MED ORDER — FAMOTIDINE IN NACL 20-0.9 MG/50ML-% IV SOLN
20.0000 mg | Freq: Once | INTRAVENOUS | Status: AC
  Administered 2021-07-10: 20 mg via INTRAVENOUS
  Filled 2021-07-10: qty 50

## 2021-07-10 MED ORDER — METRONIDAZOLE 500 MG PO TABS: 500.0000 mg | ORAL_TABLET | Freq: Two times a day (BID) | ORAL | 0 refills | Status: DC

## 2021-07-10 MED ORDER — ONDANSETRON HCL 4 MG/2ML IJ SOLN
4.0000 mg | Freq: Once | INTRAMUSCULAR | Status: AC
  Administered 2021-07-10: 4 mg via INTRAVENOUS
  Filled 2021-07-10: qty 2

## 2021-07-10 NOTE — ED Notes (Signed)
Patient transported to CT 

## 2021-07-10 NOTE — ED Triage Notes (Signed)
Pt BIB [facility]. Pt endorses N/V/D and rectal bleeding x24 hrs. Since 0800 this AM, pt began having L sided CP around L breast. Tender to palpation, worse with inspiration. Described as sharp pain 10/10. Pt very anxious and tearful. Pt endorses poor PO intake. Pt reports weakness, ambulatory on scene. 20g L AC. No antiemetics given. Pt given 162 of ASA, pt willingly only took 2 81 mg during transport from EMS.   EMS VS- 184/108, HR 72, RR 20, SPO2 97% RA, CBG 100

## 2021-07-10 NOTE — ED Notes (Signed)
RN reviewed discharge instructions w/ pt. Follow up and prescriptions reviewed, pt had no further questions

## 2021-07-10 NOTE — Discharge Instructions (Addendum)
Take your normal pain medications as needed.  Try the probiotics first.  If you still have pain, you can start the antibiotics.

## 2021-07-10 NOTE — ED Provider Notes (Signed)
[facility] DEPART[facility] Provider Note   CSN: [id number] Arrival date & time: 07/10/21  1204     History  Chief Complaint  Patient presents with   Chest Pain    [name] is a 66 y.o. female with a history of sick sinus syndrome status post pacemaker, HLD, diverticulosis, presented to ED with abdominal discomfort and chest discomfort.  Patient reports that she was being treated empirically for possible diverticulitis a week ago, is completed 7 days of Augmentin prescribed by her PCP.  She states that since starting the Augmentin she has had significant gastritis, cramping abdominal pain, now increasing diarrhea over the past 24 hours.  She reports that there was rawness near her rectum and bright red blood when she wipes (she has a history of hemorrhoids as well).  She did feel that this morning when she woke up there is some pressure on the left side of her chest.  She feels nauseated.  She is here with her daughter at the bedside.  She reports a history of cholecystectomy and hysterectomy.  HPI     Home Medications Prior to Admission medications   Medication Sig Start Date End Date Taking? Authorizing Provider  ACCU-CHEK GUIDE test strip  02/25/20   [provider]  albuterol (VENTOLIN HFA) 108 (90 Base) MCG/ACT inhaler Inhale into the lungs. 03/18/21   [provider]  ALPRAZolam Duanne Moron) 0.5 MG tablet Take 1 tablet by mouth as needed. 08/16/17   [provider]  aspirin 81 MG chewable tablet Chew by mouth.    [provider]  atorvastatin (LIPITOR) 10 MG tablet atorvastatin 10 mg tablet 01/31/20   [provider]  azelastine (ASTELIN) 0.1 % nasal spray Place 2 sprays into both nostrils 2 (two) times daily. Use in each nostril as directed 10/18/17   [name], PA  Azilsartan Medoxomil (EDARBI) 40 MG TABS Take 40 mg by mouth daily.     [provider]  Bacillus Coagulans-Inulin (PROBIOTIC) 1-250  BILLION-MG CAPS Probiotic    [provider]  beclomethasone (QVAR) 40 MCG/ACT inhaler Inhale 2 puffs into the lungs 2 (two) times daily as needed (for shortness of breath).     [provider]  Blood Glucose Monitoring Suppl (ACCU-CHEK GUIDE) w/Device KIT USE DAILY AS DIRECTED 05/17/17   [name], PA  calcipotriene (DOVONOX) 0.005 % cream Apply topically 2 (two) times daily. 07/30/19   Marzetta Board, DPM  cetirizine (ZYRTEC) 10 MG tablet TAKE 1 TABLET (10 MG TOTAL) BY MOUTH DAILY. 11/21/17   [name], PA  Cetirizine HCl 0.24 % SOLN     [provider]  Cholecalciferol (VITAMIN D) 2000 units CAPS Take 1 capsule (2,000 Units total) by mouth daily. 10/18/17   [name], PA  Cholecalciferol 50 MCG (2000 UT) CAPS Take 1 tablet by mouth daily. Patient not taking: Reported on 05/13/2021 12/28/20   [provider]  clobetasol ointment (TEMOVATE) 0.05 % Apply topically. 05/13/20   [provider]  cyclobenzaprine (FLEXERIL) 10 MG tablet Take 10 mg by mouth 3 (three) times daily as needed for muscle spasms.     [provider]  desoximetasone (TOPICORT) 0.05 % cream     [provider]  diphenoxylate-atropine (LOMOTIL) 2.5-0.025 MG tablet Take 2 tablets by mouth 4 (four) times daily as needed for diarrhea or loose stools.     [provider]  docusate sodium (COLACE) 100 MG capsule TAKE 1 CAPSULE (100 MG TOTAL) BY MOUTH  DAILY AS NEEDED FOR MILD CONSTIPATION. 10/23/14   [name], PA  doxycycline (ADOXA) 100 MG tablet Take 100 mg by mouth as needed. Patient not taking: Reported on 05/13/2021 04/22/20   [provider]  escitalopram (LEXAPRO) 20 MG tablet Take 20 mg by mouth daily 10/18/17   [name], PA  ezetimibe (ZETIA) 10 MG tablet Take 1 tablet (10 mg total) by mouth every evening. 10/18/17   [name], PA  FLOVENT HFA 110 MCG/ACT inhaler Take 2 puffs by mouth as needed. 08/29/17   [provider]  fluticasone (FLONASE) 50 MCG/ACT nasal spray Place 2 sprays into both nostrils daily. Patient taking differently: Place 2 sprays into both nostrils as needed. 10/18/17   [name], PA  fluticasone (FLONASE) 50 MCG/ACT nasal spray Place into the nose. Patient not taking: Reported on 05/13/2021 09/17/20   [provider]  hydrALAZINE (APRESOLINE) 25 MG tablet Take 25 mg by mouth as needed. 06/01/19   [provider]  hydrocortisone 2.5 % cream  01/01/19   [provider]  Loperamide HCl 1 MG/7.5ML LIQD     [provider]  LOTEMAX SM 0.38 % GEL Place 1 drop into both eyes as needed. 10/18/17   [provider]  meloxicam (MOBIC) 15 MG tablet TAKE 1 TABLET BY MOUTH EVERY DAY Patient taking differently: as needed. 02/24/16   [name], PA  metFORMIN (GLUCOPHAGE) 500 MG tablet as needed. 05/14/19   [provider]  metoprolol tartrate (LOPRESSOR) 25 MG tablet Take 0.5 tablets (12.5 mg total) by mouth 2 (two) times daily. 02/11/17   Dixie Dials, MD  Misc. Devices MISC Blood pressure monitor Length of need:  99 months 06/22/20   [provider]  Multiple Vitamin (MULTI-VITAMIN DAILY PO) Multi Vitamin    [provider]  naloxone (NARCAN) nasal spray 4 mg/0.1 mL naloxone 4 mg/actuation nasal spray  Take by nasal route.    [provider]  nitroGLYCERIN (NITROSTAT) 0.3 MG SL tablet     [provider]  olopatadine (PATANOL) 0.1 % ophthalmic solution Place 1 drop into both eyes 2 (two) times daily. 10/18/17   [name], PA  ondansetron (ZOFRAN ODT) 4 MG disintegrating tablet Take 1 tablet (4 mg total) by mouth every 8 (eight) hours as needed for nausea. 11/20/16   Larene Pickett, PA-C  oxyCODONE-acetaminophen (PERCOCET) 10-325 MG tablet Take 1.5 tablets by mouth daily.    [provider]  Peppermint Oil (IBGARD PO) Take by mouth as needed.     [provider]  ranitidine  (ZANTAC) 150 MG tablet     [provider]  zolpidem (AMBIEN) 10 MG tablet Take 10 mg by mouth at bedtime as needed for sleep.  09/28/13   [provider]      Allergies    Codeine, Propoxyphene n-acetaminophen, Sulfonamide derivatives, Alka-seltzer [aspirin effervescent], Norvasc [amlodipine besylate], Norvasc [amlodipine], Other, Red dye, Singulair [montelukast sodium], Sulfa antibiotics, Sulfur, Bupropion, Buspirone, Iohexol, and Latex    Review of Systems   Review of Systems  Physical Exam Updated Vital Signs BP (!) 142/82 (BP Location: Right Arm)    Pulse 61    Temp 98.7 F (37.1 C) (Oral)    Resp 14    Ht 5' 7" (1.702 m)    Wt 76.7 kg    SpO2 99%    BMI 26.47 kg/m  Physical Exam Constitutional:      General: She is not in acute distress. HENT:  Head: Normocephalic and atraumatic.  Eyes:     Conjunctiva/sclera: Conjunctivae normal.     Pupils: Pupils are equal, round, and reactive to light.  Cardiovascular:     Rate and Rhythm: Normal rate and regular rhythm.  Pulmonary:     Effort: Pulmonary effort is normal. No respiratory distress.  Abdominal:     General: There is no distension.     Tenderness: There is generalized abdominal tenderness.  Skin:    General: Skin is warm and dry.  Neurological:     General: No focal deficit present.     Mental Status: She is alert. Mental status is at baseline.  Psychiatric:        Mood and Affect: Mood normal.        Behavior: Behavior normal.    ED Results / Procedures / Treatments   Labs (all labs ordered are listed, but only abnormal results are displayed) Labs Reviewed - No data to display  EKG EKG Interpretation  Date/Time:  Saturday July 10 2021 12:10:35 EST Ventricular Rate:  61 PR Interval:  170 QRS Duration: 99 QT Interval:  405 QTC Calculation: 408 R Axis:   64 Text Interpretation: Sinus rhythm Repol abnrm suggests ischemia, anterolateral Inverted T waves seen on prior tracing, Sept 8  2018, no STEMI Confirmed by Octaviano Glow [phone]) on 07/10/2021 12:12:53 PM  Radiology No results found.  Procedures Procedures    Medications Ordered in ED Medications - No data to display  ED Course/ Medical Decision Making/ A&P                           Medical Decision Making Amount and/or Complexity of Data Reviewed Labs: ordered. Radiology: ordered.  Risk OTC drugs. Prescription drug management.   This patient presents to the Emergency Department with complaint of abdominal pain. This involves an extensive number of treatment options, and is a complaint that carries with it a high risk of complications and morbidity.  The differential diagnosis includes, but is not limited to, gastritis vs peptic ulcer versus recurrent colitis versus viral illness versus medication side effect versus other  This presentation is most consistent with a reaction to Augmentin, which is known to cause gastritis and GI upset.  It is not clear to me whether she had true diverticulitis or has persistent diverticulitis at this time, given her diffuse, mild abdominal tenderness, nausea, bloody diarrhea.  We will check her blood counts, but she may need a CT scan of the abdomen to better evaluate for this.  I would not start on further antibiotics at this time otherwise.  Overall a lower suspicion for ACS.  Her EKG per my interpretation shows a sinus rhythm with chronic T wave inversions in the inferior lateral leads that were noted on prior tracings, including in 2016.  I do not see acute ischemic findings.  We will try GI cocktail for her chest discomfort, which may be related to gastritis.  I also think is reasonable to check a troponin level to complete an ACS workup in the ED.  I ordered, reviewed, and interpreted labs, notable for no leukocytosis, normal lipase, normal troponin I ordered medication IV Dilaudid, IV Pepcid, GI cocktail, IV fluids, IV Zofran for abdominal pain and/or nausea I ordered  imaging studies which included ct abd pelvis with contrast, which was pending at the time of signout Additional history was obtained from patient's daughter at bedside Patient's external medical records reviewed including colonoscopy 2018,  which showed 2 polyps removed, diverticulosis of the sigmoid colon    On reassessment pain improved overall with GI cocktail; patient signed out to Dr Gilford Raid EDP pending CT abdomen pelvis.    At this time I have a lower suspicion for ACS, PE, PNA, sepsis, mesenteric ischemic, or other life-threatening infection.  Suspect her symptoms may be related to medication side effects from Augmentin (GI upset), which she has completed a course of.         Final Clinical Impression(s) / ED Diagnoses Final diagnoses:  None    Rx / DC Orders ED Discharge Orders     None         Fawaz Borquez, Carola Rhine, MD 07/10/21 [phone]

## 2021-07-10 NOTE — ED Provider Notes (Signed)
Pt signed out by Dr. Langston Masker pending CT abd/pelvis.  Results were reviewed.  IMPRESSION:  Diverticulosis of the colon. No definite or advanced diverticulitis.  One could question subtle stranding in the fat at both the splenic  flexure and in the sigmoid region that could go along with low level  diverticulitis.     Aortic Atherosclerosis (ICD10-I70.0).   There may be some mild diverticulitis.  She said the augmentin made her feel terrible and she did not want to take that again.  She said she would start with probiotics and then take cipro/flagyl if she continued to have pain.  She has oxy 10s at home.  She is to take those for pain.  Pt is to f/u with GI and pcp.  Return if worse.   Isla Pence, MD 07/10/21 [phone]

## 2021-07-12 ENCOUNTER — Ambulatory Visit: Payer: Medicare Other | Admitting: Podiatry

## 2021-07-29 ENCOUNTER — Other Ambulatory Visit: Payer: Self-pay | Admitting: Gastroenterology

## 2021-09-03 ENCOUNTER — Encounter: Payer: Self-pay | Admitting: Podiatry

## 2021-09-03 ENCOUNTER — Ambulatory Visit (INDEPENDENT_AMBULATORY_CARE_PROVIDER_SITE_OTHER): Payer: Medicare Other | Admitting: Podiatry

## 2021-09-03 ENCOUNTER — Ambulatory Visit (INDEPENDENT_AMBULATORY_CARE_PROVIDER_SITE_OTHER): Payer: Medicare Other

## 2021-09-03 DIAGNOSIS — M79675 Pain in left toe(s): Secondary | ICD-10-CM | POA: Diagnosis not present

## 2021-09-03 DIAGNOSIS — L409 Psoriasis, unspecified: Secondary | ICD-10-CM | POA: Diagnosis not present

## 2021-09-03 DIAGNOSIS — M79672 Pain in left foot: Secondary | ICD-10-CM

## 2021-09-03 DIAGNOSIS — M79674 Pain in right toe(s): Secondary | ICD-10-CM

## 2021-09-03 DIAGNOSIS — E119 Type 2 diabetes mellitus without complications: Secondary | ICD-10-CM

## 2021-09-03 DIAGNOSIS — W458XXA Other foreign body or object entering through skin, initial encounter: Secondary | ICD-10-CM

## 2021-09-03 DIAGNOSIS — B351 Tinea unguium: Secondary | ICD-10-CM | POA: Diagnosis not present

## 2021-09-03 DIAGNOSIS — R7303 Prediabetes: Secondary | ICD-10-CM | POA: Insufficient documentation

## 2021-09-03 DIAGNOSIS — M2012 Hallux valgus (acquired), left foot: Secondary | ICD-10-CM | POA: Diagnosis not present

## 2021-09-03 DIAGNOSIS — M2011 Hallux valgus (acquired), right foot: Secondary | ICD-10-CM

## 2021-09-06 ENCOUNTER — Ambulatory Visit (INDEPENDENT_AMBULATORY_CARE_PROVIDER_SITE_OTHER): Payer: Medicare Other

## 2021-09-06 DIAGNOSIS — I495 Sick sinus syndrome: Secondary | ICD-10-CM

## 2021-09-07 LAB — CUP PACEART REMOTE DEVICE CHECK
Battery Remaining Longevity: 37 mo
Battery Voltage: 2.96 V
Brady Statistic AP VP Percent: 0.07 %
Brady Statistic AP VS Percent: 99.83 %
Brady Statistic AS VP Percent: 0 %
Brady Statistic AS VS Percent: 0.1 %
Brady Statistic RA Percent Paced: 99.89 %
Brady Statistic RV Percent Paced: 0.07 %
Date Time Interrogation Session: 20230404093710
Implantable Lead Implant Date: 20160202
Implantable Lead Implant Date: 20160202
Implantable Lead Location: 753859
Implantable Lead Location: 753860
Implantable Lead Model: 5076
Implantable Lead Model: 5076
Implantable Pulse Generator Implant Date: 20160202
Lead Channel Impedance Value: 418 Ohm
Lead Channel Impedance Value: 418 Ohm
Lead Channel Impedance Value: 532 Ohm
Lead Channel Impedance Value: 532 Ohm
Lead Channel Pacing Threshold Amplitude: 0.5 V
Lead Channel Pacing Threshold Amplitude: 1 V
Lead Channel Pacing Threshold Pulse Width: 0.4 ms
Lead Channel Pacing Threshold Pulse Width: 0.4 ms
Lead Channel Sensing Intrinsic Amplitude: 20.875 mV
Lead Channel Sensing Intrinsic Amplitude: 20.875 mV
Lead Channel Sensing Intrinsic Amplitude: 4.25 mV
Lead Channel Sensing Intrinsic Amplitude: 4.25 mV
Lead Channel Setting Pacing Amplitude: 2 V
Lead Channel Setting Pacing Amplitude: 2.5 V
Lead Channel Setting Pacing Pulse Width: 0.4 ms
Lead Channel Setting Sensing Sensitivity: 2.8 mV

## 2021-09-09 NOTE — Progress Notes (Signed)
ANNUAL DIABETIC FOOT EXAM ? ?Subjective: ?[name] presents today for annual diabetic foot examination and painful thick toenails that are difficult to trim. Pain interferes with ambulation. Aggravating factors include wearing enclosed shoe gear. Pain is relieved with periodic professional debridement.. ? ?Patient relates 7 year h/o diabetes. ? ?Patient denies any h/o foot wounds. ?  ?Patient admits symptoms of foot tingling and burning on occasion. ? ?Patient admits symptoms of burning in feet. ? ?Patient did not check blood glucose this morning. ? ?Risk factors: diabetes, HTN, hyperlipidemia, h/o tobacco use in remission. ? ?Alicia Mons, PA is patient's PCP. Last visit was July 21, 2021. ? ?Today, patient c/o lesion plantar aspect of left foot for the past few weeks. Denies any preceding trauma, but is unsure if she may have stepped on something. No redness, swelling or drainage noted. ? ?Past Medical History:  ?Diagnosis Date  ? Allergy   ? Anal fissure 02/2012  ? Anxiety   ? Arthritis   ? Asthma   ? Bursitis   ? right hip  ? Complication of anesthesia   ?  woke up during a colonscopy, difficult to wake up post-op sometimes , requires lots of blankets to stay warm post op  ? Depression   ? Diabetes mellitus without complication ([place])   ? type 2 diet controlled  ? Fatty liver   ? sees dr Collene Mares for  ? Fibromyalgia   ? Gallstones 02/15/2015  ? GERD (gastroesophageal reflux disease)   ? Hepatitis A infection 1979  ? Hyperlipidemia   ? Hypertension   ? Irritable bowel syndrome (IBS)   ? 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 sees dr Rayann Heman  ? Psoriasis   ? left foot  ? Symptomatic bradycardia   ? a. s/p MDT MRI compatible dual chamber pacemaker 07/2014 Dr Rayann Heman  ? Tarsal tunnel syndrome of left side   ? ?Patient Active Problem List  ? Diagnosis Date Noted  ? Prediabetes 09/03/2021  ? Dyshidrosis (pompholyx) 11/24/2020  ? Abdominal pain, LLQ 08/10/2020  ? Arthritis 08/10/2020  ? Change in bowel habit  08/10/2020  ? Colon cancer screening 08/10/2020  ? Diverticulitis of colon 08/10/2020  ? Flatulence, eructation and gas pain 08/10/2020  ? Hemorrhage of colon due to diverticulosis 08/10/2020  ? Nausea 08/10/2020  ? Vomiting without nausea 08/10/2020  ? Irritable bowel syndrome with diarrhea 01/21/2020  ? Laryngopharyngeal reflux (LPR) 05/24/2018  ? TMJ (sprain of temporomandibular joint), initial encounter 05/24/2018  ? Aortic atherosclerosis ([place]) 10/18/2017  ? Acute coronary syndrome ([place]) 02/09/2017  ? Right knee pain 07/04/2016  ? Pain in right ankle and joints of right foot 07/04/2016  ? Psoriasis 03/10/2015  ? Right foot pain 11/13/2014  ? Ethmoid sinusitis 11/13/2014  ? Abdominal pain, epigastric 11/13/2014  ? Vitamin D deficiency 11/13/2014  ? 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 Sick sinus syndrome ([place]) 07/08/2014  ? Chest pain at rest 07/05/2014  ? Chronic rhinitis 10/22/2013  ? Fatty liver 10/22/2013  ? Interstitial cystitis 10/22/2013  ? Hip pain, right 10/22/2013  ? NASH (nonalcoholic steatohepatitis) 10/22/2013  ? Chronic insomnia 09/25/2012  ? HTN (hypertension) 09/25/2012  ? Hyperlipidemia 09/25/2012  ? Hyperlipidemia associated with type 2 diabetes mellitus ([place]) 09/25/2012  ? Diabetes mellitus type 2, controlled, without complications ([place]) 35/00/9381  ? GERD 12/04/2008  ? History of colonic polyps 12/04/2008  ? CONSTIPATION 10/21/2008  ? Fibromyalgia 10/21/2008  ? LACTOSE INTOLERANCE 11/20/2007  ? Chest pain, precordial 11/20/2007  ? Lactose intolerance 11/20/2007  ?  Anxiety and depression 08/02/2007  ? Osteoporosis 08/02/2007  ? Recurrent major depressive disorder, in remission ([place]) 08/02/2007  ? Diverticulosis of large intestine 11/16/2006  ? EXTERNAL HEMORRHOIDS 05/12/2005  ? ?Past Surgical History:  ?Procedure Laterality Date  ? ABDOMINAL HYSTERECTOMY  09/1994  ? partial  ? APPENDECTOMY    ? BILATERAL SALPINGOOPHORECTOMY Bilateral 04/2011  ? CARDIAC CATHETERIZATION  10/08/2007  ? CESAREAN SECTION     ? x 1  ? CHOLECYSTECTOMY N/A 02/16/2015  ? Procedure: LAPAROSCOPIC CHOLECYSTECTOMY WITH INTRAOPERATIVE CHOLANGIOGRAM;  Surgeon: Johnathan Hausen, MD;  Location: [facility];  Service: General;  Laterality: N/A;  ? COLONOSCOPY WITH PROPOFOL N/A 08/19/2016  ? Procedure: COLONOSCOPY WITH PROPOFOL;  Surgeon: Carol Ada, MD;  Location: [facility];  Service: Endoscopy;  Laterality: N/A;  ? CYSTOSCOPY W/ URETERAL STENT PLACEMENT  08/04/2009  ? EXAMINATION UNDER ANESTHESIA  02/17/2012  ? Procedure: EXAM UNDER ANESTHESIA;  Surgeon: Pedro Earls, MD;  Location: [facility];  Service: General;  Laterality: N/A;  Exam under anesthesia with lateral internal sphincterotomy  ? LAPAROSCOPIC LYSIS INTESTINAL ADHESIONS  08/04/2009  ? LEFT HEART CATH AND CORONARY ANGIOGRAPHY N/A 02/10/2017  ? Procedure: LEFT HEART CATH AND CORONARY ANGIOGRAPHY;  Surgeon: Dixie Dials, MD;  Location: [facility];  Service: Cardiovascular;  Laterality: N/A;  ? PERMANENT PACEMAKER INSERTION N/A 07/08/2014  ? MDT MRI compatible dual chamber pacemaker implanted by Dr Rayann Heman for symptomatic bradycardia  ? SHOULDER ARTHROSCOPY W/ ROTATOR CUFF REPAIR Left 1999  ? SMALL INTESTINE SURGERY    ? SPHINCTEROTOMY  02/17/2012  ? Procedure: SPHINCTEROTOMY;  Surgeon: Pedro Earls, MD;  Location: [facility];  Service: General;  Laterality: N/A;  ? ?Current Outpatient Medications on File Prior to Visit  ?Medication Sig Dispense Refill  ? atorvastatin (LIPITOR) 10 MG tablet Take 1 tablet by mouth daily.    ? ACCU-CHEK GUIDE test strip     ? albuterol (VENTOLIN HFA) 108 (90 Base) MCG/ACT inhaler Inhale into the lungs.    ? ALPRAZolam (XANAX) 0.5 MG tablet Take 1 tablet by mouth as needed.  0  ? aspirin 81 MG chewable tablet Chew by mouth.    ? atorvastatin (LIPITOR) 10 MG tablet atorvastatin 10 mg tablet    ? azelastine (ASTELIN) 0.1 % nasal spray Place 2 sprays into both nostrils 2 (two) times daily. Use in each nostril as directed 30 mL prn   ? Azilsartan Medoxomil (EDARBI) 40 MG TABS Take 40 mg by mouth daily.     ? Bacillus Coagulans-Inulin (PROBIOTIC) 1-250 BILLION-MG CAPS Probiotic    ? beclomethasone (QVAR) 40 MCG/ACT inhaler Inhale 2 puffs into the lungs 2 (two) times daily as needed (for shortness of breath).     ? Blood Glucose Monitoring Suppl (ACCU-CHEK GUIDE) w/Device KIT USE DAILY AS DIRECTED 1 kit 0  ? calcipotriene (DOVONOX) 0.005 % cream Apply topically 2 (two) times daily. 120 g 4  ? cetirizine (ZYRTEC) 10 MG tablet TAKE 1 TABLET (10 MG TOTAL) BY MOUTH DAILY. 90 tablet 1  ? Cetirizine HCl 0.24 % SOLN  (Patient not taking: Reported on 05/13/2021)    ? Cholecalciferol (VITAMIN D) 2000 units CAPS Take 1 capsule (2,000 Units total) by mouth daily. 90 capsule 3  ? Cholecalciferol 50 MCG (2000 UT) CAPS Take 1 tablet by mouth daily. (Patient not taking: Reported on 05/13/2021)    ? ciprofloxacin (CIPRO) 500 MG tablet Take 1 tablet (500 mg total) by mouth every 12 (twelve) hours. 14 tablet 0  ?  clobetasol ointment (TEMOVATE) 0.05 % Apply topically.    ? cyclobenzaprine (FLEXERIL) 10 MG tablet Take 10 mg by mouth 3 (three) times daily as needed for muscle spasms.     ? desoximetasone (TOPICORT) 0.05 % cream     ? diphenoxylate-atropine (LOMOTIL) 2.5-0.025 MG tablet Take 2 tablets by mouth 4 (four) times daily as needed for diarrhea or loose stools.     ? docusate sodium (COLACE) 100 MG capsule TAKE 1 CAPSULE (100 MG TOTAL) BY MOUTH DAILY AS NEEDED FOR MILD CONSTIPATION. 30 capsule 0  ? doxycycline (ADOXA) 100 MG tablet Take 100 mg by mouth as needed. (Patient not taking: Reported on 05/13/2021)    ? escitalopram (LEXAPRO) 20 MG tablet Take 20 mg by mouth daily 90 tablet 3  ? ezetimibe (ZETIA) 10 MG tablet Take 1 tablet (10 mg total) by mouth every evening. 90 tablet 3  ? FLOVENT HFA 110 MCG/ACT inhaler Take 2 puffs by mouth as needed.  12  ? fluticasone (FLONASE) 50 MCG/ACT nasal spray Place 2 sprays into both nostrils daily. (Patient taking  differently: Place 2 sprays into both nostrils as needed.) 16 g 12  ? fluticasone (FLONASE) 50 MCG/ACT nasal spray Place into the nose. (Patient not taking: Reported on 05/13/2021)    ? hydrALAZINE (APRESOLI

## 2021-09-20 NOTE — Progress Notes (Signed)
Remote pacemaker transmission.   

## 2021-09-21 ENCOUNTER — Telehealth: Payer: Self-pay | Admitting: *Deleted

## 2021-09-21 NOTE — Telephone Encounter (Signed)
Patient is calling because her callus that was treated 2 weeks ago is still very tender and sore, is requesting that something (some type of cream) be called into her pharmacy to help with the pain/discomfort. Please advise. ?

## 2021-09-21 NOTE — Telephone Encounter (Signed)
Pt called back offered appt 4.21 but she has dental appt. I have her scheduled 4.26 and on waitlist.. ?

## 2021-09-29 ENCOUNTER — Encounter: Payer: Self-pay | Admitting: Podiatry

## 2021-09-29 ENCOUNTER — Ambulatory Visit (INDEPENDENT_AMBULATORY_CARE_PROVIDER_SITE_OTHER): Payer: Medicare Other | Admitting: Podiatry

## 2021-09-29 DIAGNOSIS — Q828 Other specified congenital malformations of skin: Secondary | ICD-10-CM

## 2021-09-29 DIAGNOSIS — E119 Type 2 diabetes mellitus without complications: Secondary | ICD-10-CM

## 2021-09-29 DIAGNOSIS — M79672 Pain in left foot: Secondary | ICD-10-CM

## 2021-09-29 NOTE — Progress Notes (Signed)
?  Subjective:  ?Patient ID: [name], female    DOB: 26-Nov-1955,  MRN: [id number] ? ?[name] presents to clinic today for  left forefoot pain. She denies any recent episodes of trauma. Patient states lesion is most painful when weightbearing. Denies any redness, drainage or swelling.    ? ?Last known HgA1c was 6.7%. Patient did not check blood glucose today. ? ?PCP is [name], [place] , and last visit was July 21, 2021. ? ?Allergies  ?Allergen Reactions  ? Codeine Other (See Comments)  ?  HALLUCINATIONS ?  ? Propoxyphene N-Acetaminophen Other (See Comments)  ?  HALLUCINATIONS  ? Sulfonamide Derivatives Hives, Itching and Swelling  ? Alka-Seltzer [Aspirin Effervescent] Other (See Comments)  ?  "like she's fading away", jittery   ? Amlodipine   ?  Other reaction(s): Unknown ?Other reaction(s): Unknown  ? Amoxicillin-Pot Clavulanate   ?  Other reaction(s): Unknown  ? Norvasc [Amlodipine Besylate] Other (See Comments)  ?  "passed out"  ? Other Other (See Comments)  ?  Other reaction(s): Unknown ?Other reaction(s): Unknown ?Other reaction(s): Unknown  ? Red Dye Other (See Comments)  ?  "burning and tingling sensation" ?Other reaction(s): Unknown  ? Singulair [Montelukast Sodium] Other (See Comments)  ?  Increased depression, sadness  ? Sulfa Antibiotics Itching  ?  Other reaction(s): Unknown  ? Sulfur   ?  Other reaction(s): Unknown  ? Bupropion Anxiety and Other (See Comments)  ?  headache  ? Buspirone Other (See Comments)  ?  Abdominal bloating and increased intestinal gas  ? Latex Hives and Itching  ? ? ?Review of Systems: Negative except as noted in the HPI. ? ?Objective:  ?There were no vitals filed for this visit. ? ?[name] is a pleasant 66 y.o. female in NAD. AAO X 3. ? ?Vascular Examination: ?CFT immediate b/l LE. Palpable DP/PT pulses b/l LE. Digital hair present b/l. Skin temperature gradient WNL b/l. No pain with calf compression b/l. No edema noted b/l. No cyanosis or clubbing noted b/l  LE. ? ?Dermatological Examination: ?No open wounds b/l LE. No interdigital macerations noted b/l LE. Toenail(s) recently debrided. Porokeratotic lesion(s) remaining left forefoot tender to palpation.  No erythema, no edema, no drainage, no fluctuance. ? ?Scaly patches consistent with psoriasis. ? ?Neurological Examination: ?Pt has subjective symptoms of neuropathy. Protective sensation intact 5/5 intact bilaterally with 10g monofilament b/l. ? ?Musculoskeletal Examination: ?Muscle strength 5/5 to all lower extremity muscle groups bilaterally. HAV with bunion deformity noted b/l LE. ? ?Assessment/Plan: ?1. Porokeratosis   ?2. Left foot pain   ?3. Controlled type 2 diabetes mellitus without complication, without long-term current use of insulin ([place])   ?  ?-Patient was evaluated and treated. All patient's and/or POA's questions/concerns answered on today's visit. ?-As a courtesy, pared and enucleated painful porokeratotic lesion left foot without incident. ?-Patient/POA to call should there be question/concern in the interim.  ?-She is to keep upcoming appointment. ? ?Marzetta Board, DPM  ? ?

## 2021-10-15 ENCOUNTER — Encounter (HOSPITAL_COMMUNITY): Payer: Self-pay | Admitting: Gastroenterology

## 2021-10-20 DIAGNOSIS — R55 Syncope and collapse: Secondary | ICD-10-CM | POA: Insufficient documentation

## 2021-10-20 DIAGNOSIS — Z79891 Long term (current) use of opiate analgesic: Secondary | ICD-10-CM | POA: Insufficient documentation

## 2021-10-20 DIAGNOSIS — E2839 Other primary ovarian failure: Secondary | ICD-10-CM | POA: Insufficient documentation

## 2021-10-20 DIAGNOSIS — Z79899 Other long term (current) drug therapy: Secondary | ICD-10-CM | POA: Insufficient documentation

## 2021-10-21 NOTE — Anesthesia Preprocedure Evaluation (Addendum)
Anesthesia Evaluation  Patient identified by MRN, date of birth, ID band Patient awake    Reviewed: Allergy & Precautions, NPO status , Patient's Chart, lab work & pertinent test results  Airway Mallampati: II  TM Distance: >3 FB Neck ROM: Full    Dental no notable dental hx. (+) Partial Lower, Partial Upper   Pulmonary asthma (uses inhaler) , former smoker,    Pulmonary exam normal breath sounds clear to auscultation       Cardiovascular hypertension, Normal cardiovascular exam Rhythm:Regular Rate:Normal     Neuro/Psych Anxiety    GI/Hepatic GERD  ,  Endo/Other  diabetes, Type 2  Renal/GU      Musculoskeletal  (+) Arthritis , Fibromyalgia -  Abdominal   Peds  Hematology   Anesthesia Other Findings ALL: see list  Reproductive/Obstetrics                            Anesthesia Physical Anesthesia Plan  ASA: 3  Anesthesia Plan: MAC   Post-op Pain Management:    Induction:   PONV Risk Score and Plan: Treatment may vary due to age or medical condition  Airway Management Planned: Natural Airway and Nasal Cannula  Additional Equipment: None  Intra-op Plan:   Post-operative Plan:   Informed Consent: I have reviewed the patients History and Physical, chart, labs and discussed the procedure including the risks, benefits and alternatives for the proposed anesthesia with the patient or authorized representative who has indicated his/her understanding and acceptance.     Dental advisory given  Plan Discussed with: CRNA and Anesthesiologist  Anesthesia Plan Comments: (Screening colonoscopy)       Anesthesia Quick Evaluation

## 2021-10-22 ENCOUNTER — Ambulatory Visit (HOSPITAL_COMMUNITY)
Admission: RE | Admit: 2021-10-22 | Discharge: 2021-10-22 | Disposition: A | Discharge status: Home / Self Care | Payer: Medicare Other | Source: Ambulatory Visit | Attending: Gastroenterology | Admitting: Gastroenterology

## 2021-10-22 ENCOUNTER — Other Ambulatory Visit: Payer: Self-pay

## 2021-10-22 ENCOUNTER — Ambulatory Visit (HOSPITAL_COMMUNITY): Payer: Medicare Other | Admitting: Anesthesiology

## 2021-10-22 ENCOUNTER — Encounter (HOSPITAL_COMMUNITY)
Admission: RE | Disposition: A | Discharge status: Home / Self Care | Payer: Self-pay | Source: Ambulatory Visit | Attending: Gastroenterology

## 2021-10-22 ENCOUNTER — Encounter (HOSPITAL_COMMUNITY): Payer: Self-pay | Admitting: Gastroenterology

## 2021-10-22 ENCOUNTER — Ambulatory Visit (HOSPITAL_BASED_OUTPATIENT_CLINIC_OR_DEPARTMENT_OTHER): Payer: Medicare Other | Admitting: Anesthesiology

## 2021-10-22 DIAGNOSIS — D122 Benign neoplasm of ascending colon: Secondary | ICD-10-CM | POA: Insufficient documentation

## 2021-10-22 DIAGNOSIS — D123 Benign neoplasm of transverse colon: Secondary | ICD-10-CM | POA: Diagnosis not present

## 2021-10-22 DIAGNOSIS — K5730 Diverticulosis of large intestine without perforation or abscess without bleeding: Secondary | ICD-10-CM

## 2021-10-22 DIAGNOSIS — Z800 Family history of malignant neoplasm of digestive organs: Secondary | ICD-10-CM | POA: Diagnosis not present

## 2021-10-22 DIAGNOSIS — Z86010 Personal history of colonic polyps: Secondary | ICD-10-CM | POA: Diagnosis not present

## 2021-10-22 DIAGNOSIS — D175 Benign lipomatous neoplasm of intra-abdominal organs: Secondary | ICD-10-CM | POA: Diagnosis not present

## 2021-10-22 DIAGNOSIS — J45909 Unspecified asthma, uncomplicated: Secondary | ICD-10-CM | POA: Insufficient documentation

## 2021-10-22 DIAGNOSIS — Z87891 Personal history of nicotine dependence: Secondary | ICD-10-CM | POA: Diagnosis not present

## 2021-10-22 DIAGNOSIS — Z1211 Encounter for screening for malignant neoplasm of colon: Secondary | ICD-10-CM | POA: Diagnosis not present

## 2021-10-22 DIAGNOSIS — K635 Polyp of colon: Secondary | ICD-10-CM | POA: Diagnosis not present

## 2021-10-22 DIAGNOSIS — E119 Type 2 diabetes mellitus without complications: Secondary | ICD-10-CM | POA: Diagnosis not present

## 2021-10-22 HISTORY — PX: POLYPECTOMY: SHX5525

## 2021-10-22 HISTORY — PX: COLONOSCOPY WITH PROPOFOL: SHX5780

## 2021-10-22 LAB — GLUCOSE, CAPILLARY: Glucose-Capillary: 95 mg/dL (ref 70–99)

## 2021-10-22 SURGERY — COLONOSCOPY WITH PROPOFOL
Anesthesia: Monitor Anesthesia Care

## 2021-10-22 MED ORDER — LIDOCAINE 2% (20 MG/ML) 5 ML SYRINGE
INTRAMUSCULAR | Status: DC | PRN
  Administered 2021-10-22: 100 mg via INTRAVENOUS

## 2021-10-22 MED ORDER — PROPOFOL 500 MG/50ML IV EMUL
INTRAVENOUS | Status: DC | PRN
Start: 2021-10-22 — End: 2021-10-22
  Administered 2021-10-22: 150 ug/kg/min via INTRAVENOUS

## 2021-10-22 MED ORDER — SODIUM CHLORIDE 0.9 % IV SOLN: INTRAVENOUS | Status: DC

## 2021-10-22 MED ORDER — LACTATED RINGERS IV SOLN: INTRAVENOUS | Status: DC

## 2021-10-22 MED ORDER — PROPOFOL 10 MG/ML IV BOLUS
INTRAVENOUS | Status: DC | PRN
  Administered 2021-10-22: 10 mg via INTRAVENOUS
  Administered 2021-10-22: 20 mg via INTRAVENOUS
  Administered 2021-10-22: 10 mg via INTRAVENOUS
  Administered 2021-10-22 (×2): 20 mg via INTRAVENOUS

## 2021-10-22 SURGICAL SUPPLY — 22 items

## 2021-10-22 NOTE — Anesthesia Procedure Notes (Signed)
Procedure Name: MAC Date/Time: 10/22/2021 7:43 AM Performed by: Lieutenant Diego, CRNA Pre-anesthesia Checklist: Patient identified, Emergency Drugs available, Suction available, Patient being monitored and Timeout performed Patient Re-evaluated:Patient Re-evaluated prior to induction Oxygen Delivery Method: Simple face mask Preoxygenation: Pre-oxygenation with 100% oxygen Induction Type: IV induction

## 2021-10-22 NOTE — Anesthesia Postprocedure Evaluation (Signed)
Anesthesia Post Note  Patient: [name]  Procedure(s) Performed: COLONOSCOPY WITH PROPOFOL POLYPECTOMY     Patient location during evaluation: Endoscopy Anesthesia Type: MAC Level of consciousness: awake and alert Pain management: pain level controlled Vital Signs Assessment: post-procedure vital signs reviewed and stable Respiratory status: spontaneous breathing, nonlabored ventilation, respiratory function stable and patient connected to nasal cannula oxygen Cardiovascular status: blood pressure returned to baseline and stable Postop Assessment: no apparent nausea or vomiting Anesthetic complications: no   No notable events documented.  Last Vitals:  Vitals:   10/22/21 0825 10/22/21 0830  BP: 127/68 138/80  Pulse: 62 (!) 59  Resp: 14 18  Temp:    SpO2: 97% 99%    Last Pain:  Vitals:   10/22/21 0835  TempSrc:   PainSc: 0-No pain                 Barnet Glasgow

## 2021-10-22 NOTE — Transfer of Care (Signed)
Immediate Anesthesia Transfer of Care Note  Patient: ALEASE FAIT  Procedure(s) Performed: COLONOSCOPY WITH PROPOFOL POLYPECTOMY  Patient Location: Endoscopy Unit  Anesthesia Type:MAC  Level of Consciousness: drowsy  Airway & Oxygen Therapy: Patient Spontanous Breathing and Patient connected to face mask oxygen  Post-op Assessment: Report given to RN and Post -op Vital signs reviewed and stable  Post vital signs: Reviewed and stable  Last Vitals:  Vitals Value Taken Time  BP    Temp    Pulse 59 10/22/21 0804  Resp 20 10/22/21 0804  SpO2 98 % 10/22/21 0804  Vitals shown include unvalidated device data.  Last Pain:  Vitals:   10/22/21 0722  TempSrc: Temporal  PainSc: 0-No pain         Complications: No notable events documented.

## 2021-10-22 NOTE — Op Note (Signed)
[facility] Patient Name: [name] Procedure Date: 10/22/2021 MRN: [id number] Attending MD: Carol Ada , MD Date of Birth: Mar 29, 1956 CSN: [id number] Age: 66 Admit Type: Outpatient Procedure:                Colonoscopy Indications:              High risk colon cancer surveillance: Personal                            history of colonic polyps Providers:                Carol Ada, MD, Jeanella Cara, RN,                            William Dalton, Technician Referring MD:              Medicines:                Propofol per Anesthesia Complications:            No immediate complications. Estimated Blood Loss:     Estimated blood loss: none. Procedure:                Pre-Anesthesia Assessment:                           - Prior to the procedure, a History and Physical                            was performed, and patient medications and                            allergies were reviewed. The patient's tolerance of                            previous anesthesia was also reviewed. The risks                            and benefits of the procedure and the sedation                            options and risks were discussed with the patient.                            All questions were answered, and informed consent                            was obtained. Prior Anticoagulants: The patient has                            taken no previous anticoagulant or antiplatelet                            agents. ASA Grade Assessment: III - A patient with                            severe systemic  disease. After reviewing the risks                            and benefits, the patient was deemed in                            satisfactory condition to undergo the procedure.                           - Sedation was administered by an anesthesia                            professional. Deep sedation was attained.                           After obtaining informed consent, the  colonoscope                            was passed under direct vision. Throughout the                            procedure, the patient's blood pressure, pulse, and                            oxygen saturations were monitored continuously. The                            [id number]-HQ190L ([id number] Olympus colonoscope was                            introduced through the anus and advanced to the the                            cecum, identified by appendiceal orifice and                            ileocecal valve. The colonoscopy was performed                            without difficulty. The patient tolerated the                            procedure well. The quality of the bowel                            preparation was evaluated using the BBPS [facility]                            Bowel Preparation Scale) with scores of: Right                            Colon = 3 (entire mucosa seen well with no residual  staining, small fragments of stool or opaque                            liquid), Transverse Colon = 3 (entire mucosa seen                            well with no residual staining, small fragments of                            stool or opaque liquid) and Left Colon = 3 (entire                            mucosa seen well with no residual staining, small                            fragments of stool or opaque liquid). The total                            BBPS score equals 9. The quality of the bowel                            preparation was good. The ileocecal valve,                            appendiceal orifice, and rectum were photographed. Scope In: 7:38:56 AM Scope Out: 7:59:44 AM Scope Withdrawal Time: 0 hours 14 minutes 11 seconds  Total Procedure Duration: 0 hours 20 minutes 48 seconds  Findings:      Five sessile polyps were found in the transverse colon and ascending       colon. The polyps were 2 to 3 mm in size. These polyps were removed with       a cold  snare. Resection and retrieval were complete.      There was a medium-sized lipoma, in the transverse colon.      Scattered small and large-mouthed diverticula were found in the sigmoid       colon. Impression:               - Five 2 to 3 mm polyps in the transverse colon and                            in the ascending colon, removed with a cold snare.                            Resected and retrieved.                           - Medium-sized lipoma in the transverse colon.                           - Diverticulosis in the sigmoid colon. Moderate Sedation:      Not Applicable - Patient had care per Anesthesia. Recommendation:           - Patient has a contact number available for  emergencies. The signs and symptoms of potential                            delayed complications were discussed with the                            patient. Return to normal activities tomorrow.                            Written discharge instructions were provided to the                            patient.                           - Resume previous diet.                           - Continue present medications.                           - Await pathology results.                           - Repeat colonoscopy in 3 years for surveillance. Procedure Code(s):        --- Professional ---                           [phone], Colonoscopy, flexible; with removal of                            tumor(s), polyp(s), or other lesion(s) by snare                            technique Diagnosis Code(s):        --- Professional ---                           K63.5, Polyp of colon                           Z86.010, Personal history of colonic polyps                           D17.5, Benign lipomatous neoplasm of                            intra-abdominal organs                           K57.30, Diverticulosis of large intestine without                            perforation or abscess without  bleeding CPT copyright 2019 [employer]. All rights reserved. The codes documented in this report are preliminary and upon coder review may  be revised to meet current compliance requirements. Carol Ada, MD Carol Ada, MD  10/22/2021 8:08:24 AM This report has been signed electronically. Number of Addenda: 0

## 2021-10-22 NOTE — H&P (Signed)
[name] HPI: This 66 year old black female presents to the office for colorectal cancer screening. She has 2-5 BM's per day which can be loose at times. She has Lomotil on hand to use for diarrhea.  She had rectal bleeding, chest pain and abdominal pain on 07/09/2021 and was seen at [facility] ED. She was admitted overnight for diverticulitis. She took Augmentin 875 mg and had a severe allergic reaction with diarrhea, nausea, vomiting and rectal bleeding. She still has some LLQ pain occasionally. She has Ondansetron on hand for occasional nausea. She has a good appetite and her weight has been stable. She denies having any complaints of vomiting, acid reflux, dysphagia or odynophagia. She denies having a family history of celiac sprue or IBD. Her maternal grandfather died of colon cancer in his 71's. Her maternal cousin was diagnosed with colon cancer in her 6's. Her last colonoscopy done on 08/19/2016 which revealed scattered diverticula in the sigmoid colon, a tubular adenoma was removed from the descending colon and random colonic biopsies were benign with no evidence of colitis.  Past Medical History:  Diagnosis Date   Allergy    Anal fissure 02/2012   Anxiety    Arthritis    Asthma    Bursitis    right hip   Complication of anesthesia     woke up during a colonscopy, difficult to wake up post-op sometimes , requires lots of blankets to stay warm post op   Depression    Diabetes mellitus without complication ([place])    type 2 diet controlled   Fatty liver    sees dr Collene Mares for   Fibromyalgia    Gallstones 02/15/2015   GERD (gastroesophageal reflux disease)    Hepatitis A infection 1979   Hyperlipidemia    Hypertension    Irritable bowel syndrome (IBS)    Presence of permanent cardiac pacemaker 07/2014   sees dr allred   Psoriasis    left foot   Symptomatic bradycardia    a. s/p MDT MRI compatible dual chamber pacemaker 07/2014 Dr Allred   Tarsal tunnel syndrome of left side     Past  Surgical History:  Procedure Laterality Date   ABDOMINAL HYSTERECTOMY  09/1994   partial   APPENDECTOMY     BILATERAL SALPINGOOPHORECTOMY Bilateral 04/2011   CARDIAC CATHETERIZATION  10/08/2007   CESAREAN SECTION     x 1   CHOLECYSTECTOMY N/A 02/16/2015   Procedure: LAPAROSCOPIC CHOLECYSTECTOMY WITH INTRAOPERATIVE CHOLANGIOGRAM;  Surgeon: Johnathan Hausen, MD;  Location: [facility];  Service: General;  Laterality: N/A;   COLONOSCOPY WITH PROPOFOL N/A 08/19/2016   Procedure: COLONOSCOPY WITH PROPOFOL;  Surgeon: Carol Ada, MD;  [facility];  Service: Endoscopy;  Laterality: N/A;   CYSTOSCOPY W/ URETERAL STENT PLACEMENT  08/04/2009   EXAMINATION UNDER ANESTHESIA  02/17/2012   Procedure: EXAM UNDER ANESTHESIA;  Surgeon: Pedro Earls, MD;  Location: [place];  Service: General;  Laterality: N/A;  Exam under anesthesia with lateral internal sphincterotomy   LAPAROSCOPIC LYSIS INTESTINAL ADHESIONS  08/04/2009   LEFT HEART CATH AND CORONARY ANGIOGRAPHY N/A 02/10/2017   Procedure: LEFT HEART CATH AND CORONARY ANGIOGRAPHY;  Surgeon: Dixie Dials, MD;  Location: [place] [facility];  Service: Cardiovascular;  Laterality: N/A;   PERMANENT PACEMAKER INSERTION N/A 07/08/2014   MDT MRI compatible dual chamber pacemaker implanted by Dr Rayann Heman for symptomatic bradycardia   SHOULDER ARTHROSCOPY W/ ROTATOR CUFF REPAIR Left 1999   SMALL INTESTINE SURGERY  SPHINCTEROTOMY  02/17/2012   Procedure: SPHINCTEROTOMY;  Surgeon: Pedro Earls, MD;  Location: [place];  Service: General;  Laterality: N/A;    Family History  Problem Relation Age of Onset   Cancer Mother        liver   Diabetes Mother    Hyperlipidemia Mother    Hypertension Mother    Stroke Mother    Cancer Maternal Grandmother        breast   Anesthesia problems Sister        hard to wake up post-op   Heart disease Father    Hyperlipidemia Brother    HIV/AIDS Brother    Cancer Maternal Grandfather         colon   Diabetes Brother    Cancer Brother        lung   Cancer Paternal Grandfather        COLON    Social History:  reports that she quit smoking about 9 years ago. Her smoking use included cigarettes. She has a 18.50 pack-year smoking history. She has never used smokeless tobacco. She reports that she does not drink alcohol and does not use drugs.  Allergies:  Allergies  Allergen Reactions   Codeine Other (See Comments)    HALLUCINATIONS    Propoxyphene N-Acetaminophen Other (See Comments)    HALLUCINATIONS   Sulfonamide Derivatives Hives, Itching and Swelling   Alka-Seltzer [Aspirin Effervescent] Other (See Comments)    "like she's fading away", jittery    Amlodipine     In high doses causes stomach issues    Amoxicillin-Pot Clavulanate     In high doses causes stomach issues    Norvasc [Amlodipine Besylate] Other (See Comments)    "passed out"   Red Dye Other (See Comments)    "burning and tingling sensation"    Singulair [Montelukast Sodium] Other (See Comments)    Increased depression, sadness   Sulfa Antibiotics Hives, Itching and Swelling   Sulfur Hives, Itching and Swelling   Bupropion Anxiety and Other (See Comments)    headache   Buspirone Other (See Comments)    Abdominal bloating and increased intestinal gas   Latex Hives and Itching    Medications: Scheduled: Continuous:  sodium chloride     lactated ringers      No results found for this or any previous visit (from the past 24 hour(s)).   No results found.  ROS:  As stated above in the HPI otherwise negative.  There were no vitals taken for this visit.    PE: Gen: NAD, Alert and Oriented HEENT:  [place]/AT, EOMI Neck: Supple, no LAD Lungs: CTA Bilaterally CV: RRR without M/G/R ABD: Soft, NTND, +BS Ext: No C/C/E  Assessment/Plan: 1) Screening colonoscopy.  [name] D 10/22/2021, 7:12 AM

## 2021-10-25 ENCOUNTER — Encounter (HOSPITAL_COMMUNITY): Payer: Self-pay | Admitting: Gastroenterology

## 2021-10-25 LAB — SURGICAL PATHOLOGY

## 2021-10-25 NOTE — Progress Notes (Signed)
Pt c/o 8/10 abd pain post colonoscopy. Pt states "this has happened before" d/t diverticulosis. Pt states she has the medications she needs to treat the issue. Encouraged pt to call MD Hung's office if no relief from current treatment plan.

## 2021-11-03 ENCOUNTER — Emergency Department (HOSPITAL_COMMUNITY): Payer: Medicare Other

## 2021-11-03 ENCOUNTER — Other Ambulatory Visit: Payer: Self-pay

## 2021-11-03 ENCOUNTER — Emergency Department (HOSPITAL_COMMUNITY)
Admission: EM | Admit: 2021-11-03 | Discharge: 2021-11-03 | Disposition: A | Discharge status: Home / Self Care | Payer: Medicare Other | Attending: Emergency Medicine | Admitting: Emergency Medicine

## 2021-11-03 ENCOUNTER — Encounter (HOSPITAL_COMMUNITY): Payer: Self-pay | Admitting: Emergency Medicine

## 2021-11-03 DIAGNOSIS — Z23 Encounter for immunization: Secondary | ICD-10-CM | POA: Diagnosis not present

## 2021-11-03 DIAGNOSIS — M25551 Pain in right hip: Secondary | ICD-10-CM | POA: Insufficient documentation

## 2021-11-03 DIAGNOSIS — Z91040 Latex allergy status: Secondary | ICD-10-CM | POA: Insufficient documentation

## 2021-11-03 DIAGNOSIS — Y92009 Unspecified place in unspecified non-institutional (private) residence as the place of occurrence of the external cause: Secondary | ICD-10-CM | POA: Diagnosis not present

## 2021-11-03 DIAGNOSIS — Z7982 Long term (current) use of aspirin: Secondary | ICD-10-CM | POA: Insufficient documentation

## 2021-11-03 DIAGNOSIS — I10 Essential (primary) hypertension: Secondary | ICD-10-CM | POA: Diagnosis not present

## 2021-11-03 MED ORDER — OXYCODONE-ACETAMINOPHEN 5-325 MG PO TABS: 2.0000 | ORAL_TABLET | Freq: Four times a day (QID) | ORAL | 0 refills | Status: AC | PRN

## 2021-11-03 MED ORDER — TETANUS-DIPHTH-ACELL PERTUSSIS 5-2.5-18.5 LF-MCG/0.5 IM SUSY
0.5000 mL | PREFILLED_SYRINGE | Freq: Once | INTRAMUSCULAR | Status: AC
  Administered 2021-11-03: 0.5 mL via INTRAMUSCULAR
  Filled 2021-11-03: qty 0.5

## 2021-11-03 MED ORDER — OXYCODONE-ACETAMINOPHEN 5-325 MG PO TABS: 2.0000 | ORAL_TABLET | Freq: Four times a day (QID) | ORAL | 0 refills | Status: DC | PRN

## 2021-11-03 MED ORDER — OXYCODONE-ACETAMINOPHEN 5-325 MG PO TABS
2.0000 | ORAL_TABLET | Freq: Once | ORAL | Status: AC
  Administered 2021-11-03: 2 via ORAL
  Filled 2021-11-03: qty 2

## 2021-11-03 MED ORDER — FENTANYL CITRATE PF 50 MCG/ML IJ SOSY
100.0000 ug | PREFILLED_SYRINGE | Freq: Once | INTRAMUSCULAR | Status: AC
  Administered 2021-11-03: 100 ug via INTRAVENOUS
  Filled 2021-11-03: qty 2

## 2021-11-03 NOTE — Progress Notes (Deleted)
Office Visit Note  Patient: [name]             Date of Birth: November 19, 1955           MRN: [id number]             PCP: [name], [place] Referring: [name], PA Visit Date: 11/17/2021 Occupation: @GUAROCC @  Subjective:  No chief complaint on file.   History of Present Illness: [name] is a 66 y.o. female ***   Activities of Daily Living:  Patient reports morning stiffness for *** {minute/hour:19697}.   Patient {ACTIONS;DENIES/REPORTS:21021675::"Denies"} nocturnal pain.  Difficulty dressing/grooming: {ACTIONS;DENIES/REPORTS:21021675::"Denies"} Difficulty climbing stairs: {ACTIONS;DENIES/REPORTS:21021675::"Denies"} Difficulty getting out of chair: {ACTIONS;DENIES/REPORTS:21021675::"Denies"} Difficulty using hands for taps, buttons, cutlery, and/or writing: {ACTIONS;DENIES/REPORTS:21021675::"Denies"}  No Rheumatology ROS completed.   PMFS History:  Patient Active Problem List   Diagnosis Date Noted   Prediabetes 09/03/2021   Dyshidrosis (pompholyx) 11/24/2020   Abdominal pain, LLQ 08/10/2020   Arthritis 08/10/2020   Change in bowel habit 08/10/2020   Colon cancer screening 08/10/2020   Diverticulitis of colon 08/10/2020   Flatulence, eructation and gas pain 08/10/2020   Hemorrhage of colon due to diverticulosis 08/10/2020   Nausea 08/10/2020   Vomiting without nausea 08/10/2020   Irritable bowel syndrome with diarrhea 01/21/2020   Laryngopharyngeal reflux (LPR) 05/24/2018   TMJ (sprain of temporomandibular joint), initial encounter 05/24/2018   Aortic atherosclerosis ([place]) 10/18/2017   Acute coronary syndrome ([place]) 02/09/2017   Right knee pain 07/04/2016   Pain in right ankle and joints of right foot 07/04/2016   Psoriasis 03/10/2015   Right foot pain 11/13/2014   Ethmoid sinusitis 11/13/2014   Abdominal pain, epigastric 11/13/2014   Vitamin D deficiency 11/13/2014   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   Sick sinus syndrome ([place]) 07/08/2014   Chest pain at  rest 07/05/2014   Chronic rhinitis 10/22/2013   Fatty liver 10/22/2013   Interstitial cystitis 10/22/2013   Hip pain, right 10/22/2013   NASH (nonalcoholic steatohepatitis) 10/22/2013   Chronic insomnia 09/25/2012   HTN (hypertension) 09/25/2012   Hyperlipidemia 09/25/2012   Hyperlipidemia associated with type 2 diabetes mellitus ([place]) 09/25/2012   Diabetes mellitus type 2, controlled, without complications ([place]) 03/30/8526   GERD 12/04/2008   History of colonic polyps 12/04/2008   CONSTIPATION 10/21/2008   Fibromyalgia 10/21/2008   LACTOSE INTOLERANCE 11/20/2007   Chest pain, precordial 11/20/2007   Lactose intolerance 11/20/2007   Anxiety and depression 08/02/2007   Osteoporosis 08/02/2007   Recurrent major depressive disorder, in remission ([place]) 08/02/2007   Diverticulosis of large intestine 11/16/2006   EXTERNAL HEMORRHOIDS 05/12/2005    Past Medical History:  Diagnosis Date   Allergy    Anal fissure 02/2012   Anxiety    Arthritis    Asthma    Bursitis    right hip   Complication of anesthesia     woke up during a colonscopy, difficult to wake up post-op sometimes , requires lots of blankets to stay warm post op   Depression    Diabetes mellitus without complication ([place])    type 2 diet controlled   Fatty liver    sees dr Collene Mares for   Fibromyalgia    Gallstones 02/15/2015   GERD (gastroesophageal reflux disease)    Hepatitis A infection 1979   Hyperlipidemia    Hypertension    Irritable bowel syndrome (IBS)    Presence of permanent cardiac pacemaker 07/2014   sees dr allred   Psoriasis    left foot  Symptomatic bradycardia    a. s/p MDT MRI compatible dual chamber pacemaker 07/2014 Dr Allred   Tarsal tunnel syndrome of left side     Family History  Problem Relation Age of Onset   Cancer Mother        liver   Diabetes Mother    Hyperlipidemia Mother    Hypertension Mother    Stroke Mother    Cancer Maternal Grandmother        breast   Anesthesia  problems Sister        hard to wake up post-op   Heart disease Father    Hyperlipidemia Brother    HIV/AIDS Brother    Cancer Maternal Grandfather        colon   Diabetes Brother    Cancer Brother        lung   Cancer Paternal Grandfather        COLON   Past Surgical History:  Procedure Laterality Date   ABDOMINAL HYSTERECTOMY  09/1994   partial   APPENDECTOMY     BILATERAL SALPINGOOPHORECTOMY Bilateral 04/2011   CARDIAC CATHETERIZATION  10/08/2007   CESAREAN SECTION     x 1   CHOLECYSTECTOMY N/A 02/16/2015   Procedure: LAPAROSCOPIC CHOLECYSTECTOMY WITH INTRAOPERATIVE CHOLANGIOGRAM;  Surgeon: Johnathan Hausen, MD;  [facility];  Service: General;  Laterality: N/A;   COLONOSCOPY WITH PROPOFOL N/A 08/19/2016   Procedure: COLONOSCOPY WITH PROPOFOL;  Surgeon: Carol Ada, MD;  [facility];  Service: Endoscopy;  Laterality: N/A;   COLONOSCOPY WITH PROPOFOL N/A 10/22/2021   Procedure: COLONOSCOPY WITH PROPOFOL;  Surgeon: Carol Ada, MD;  [facility];  Service: Gastroenterology;  Laterality: N/A;   CYSTOSCOPY W/ URETERAL STENT PLACEMENT  08/04/2009   EXAMINATION UNDER ANESTHESIA  02/17/2012   Procedure: EXAM UNDER ANESTHESIA;  Surgeon: Pedro Earls, MD;  Location: [place];  Service: General;  Laterality: N/A;  Exam under anesthesia with lateral internal sphincterotomy   LAPAROSCOPIC LYSIS INTESTINAL ADHESIONS  08/04/2009   LEFT HEART CATH AND CORONARY ANGIOGRAPHY N/A 02/10/2017   Procedure: LEFT HEART CATH AND CORONARY ANGIOGRAPHY;  Surgeon: Dixie Dials, MD;  Location: [place] [facility];  Service: Cardiovascular;  Laterality: N/A;   PERMANENT PACEMAKER INSERTION N/A 07/08/2014   MDT MRI compatible dual chamber pacemaker implanted by Dr Rayann Heman for symptomatic bradycardia   POLYPECTOMY  10/22/2021   Procedure: POLYPECTOMY;  Surgeon: Carol Ada, MD;  Location: [facility];  Service: Gastroenterology;;   SHOULDER ARTHROSCOPY W/ ROTATOR CUFF REPAIR  Left 1999   SMALL INTESTINE SURGERY     SPHINCTEROTOMY  02/17/2012   Procedure: SPHINCTEROTOMY;  Surgeon: Pedro Earls, MD;  Location: [place];  Service: General;  Laterality: N/A;   Social History   Social History Narrative   Lives alone.  Daughter and granddaughter live in [place], [place]. Pt exercise sometimes.      Immunization History  Administered Date(s) Administered   Influenza,inj,Quad PF,6+ Mos 03/10/2015, 04/12/2016, 03/24/2017   Influenza-Unspecified 03/17/2014   PFIZER(Purple Top)SARS-COV-2 Vaccination 10/02/2019, 10/23/2019, 05/12/2020   Pfizer Covid-19 Vaccine Bivalent Booster 28yr & up 05/23/2021   Pneumococcal Polysaccharide-23 03/10/2015   Td 02/16/2012   Tdap 02/16/2012     Objective: Vital Signs: There were no vitals taken for this visit.   Physical Exam   Musculoskeletal Exam: ***  CDAI Exam: CDAI Score: -- Patient Global: --; Provider Global: -- Swollen: --; Tender: -- Joint Exam 11/17/2021   No joint exam has been documented for this visit  There is currently no information documented on the homunculus. Go to the Rheumatology activity and complete the homunculus joint exam.  Investigation: No additional findings.  Imaging: No results found.  Recent Labs: Lab Results  Component Value Date   WBC 8.4 07/10/2021   HGB 13.7 07/10/2021   PLT 276 07/10/2021   NA 136 07/10/2021   K 3.5 07/10/2021   CL 103 07/10/2021   CO2 24 07/10/2021   GLUCOSE 135 (H) 07/10/2021   BUN 6 (L) 07/10/2021   CREATININE 0.72 07/10/2021   BILITOT 0.6 07/10/2021   ALKPHOS 44 07/10/2021   AST 28 07/10/2021   ALT 37 07/10/2021   PROT 7.5 07/10/2021   ALBUMIN 4.0 07/10/2021   CALCIUM 8.9 07/10/2021   GFRAA 110 10/10/2019    Speciality Comments: No specialty comments available.  Procedures:  No procedures performed Allergies: Codeine, Propoxyphene n-acetaminophen, Sulfonamide derivatives, Alka-seltzer [aspirin effervescent],  Amlodipine, Amoxicillin-pot clavulanate, Norvasc [amlodipine besylate], Red dye, Singulair [montelukast sodium], Sulfa antibiotics, Sulfur, Bupropion, Buspirone, and Latex   Assessment / Plan:     Visit Diagnoses: Fibromyalgia  Trapezius muscle spasm  Chronic insomnia  Other fatigue  DDD (degenerative disc disease), lumbar  Psoriasis  Osteopenia of multiple sites  Vitamin D deficiency  Hair loss  Diverticulosis of large intestine without hemorrhage  History of IBS  History of asthma  History of hypercholesterolemia  Essential hypertension  Pacemaker  Sick sinus syndrome (HCC)  History of gastroesophageal reflux (GERD)  Orders: No orders of the defined types were placed in this encounter.  No orders of the defined types were placed in this encounter.   Face-to-face time spent with patient was *** minutes. Greater than 50% of time was spent in counseling and coordination of care.  Follow-Up Instructions: No follow-ups on file.   Ofilia Neas, PA-C  Note - This record has been created using Dragon software.  Chart creation errors have been sought, but may not always  have been located. Such creation errors do not reflect on  the standard of medical care.

## 2021-11-03 NOTE — ED Provider Notes (Signed)
[facility] DEPT Provider Note   CSN: [id number] Arrival date & time: 11/03/21  1823     History  Chief Complaint  Patient presents with   Fall   Leg Injury    [name] is a 66 y.o. female.  66 year old female with a past medical history of hypertension, fibromyalgia, GERD, IBS presents to the ED via EMS status post fall.  Patient reports she was at home trying to chase a cat who was going after her dog, when suddenly she fell causing her to land on her right side.  She reports most of the impact was absorbed by the right hip, there are some abrasions along her knee and arms.  She did not strike her head, she did not lose consciousness and is currently not on any blood thinners.  She did receive 100 mics of fentanyl by EMS with some improvement in symptoms.  Patient is under chronic pain management due to her ongoing fibromyalgia, does take Percocet tens but she reports she has run out of this medication until June 3.  She denies any previous hip fractures, previous surgical interventions of her joints.  No chest pain, no shortness of breath or other complaints.  The history is provided by the patient and the EMS personnel.  Fall This is a new problem. The current episode started 1 to 2 hours ago. Pertinent negatives include no chest pain, no abdominal pain, no headaches and no shortness of breath.      Home Medications Prior to Admission medications   Medication Sig Start Date End Date Taking? Authorizing Provider  ACCU-CHEK GUIDE test strip  02/25/20   [provider]  albuterol (VENTOLIN HFA) 108 (90 Base) MCG/ACT inhaler Inhale 2 puffs into the lungs every 4 (four) hours as needed for wheezing. 03/18/21   [provider]  ALPRAZolam Duanne Moron) 0.5 MG tablet Take 0.5 mg by mouth at bedtime as needed for sleep. 08/16/17   [provider]  aspirin 81 MG chewable tablet Chew 81 mg by mouth once a week.    [provider]   atorvastatin (LIPITOR) 10 MG tablet Take 1 tablet by mouth daily. 08/24/21   [provider]  azelastine (ASTELIN) 0.1 % nasal spray Place 2 sprays into both nostrils 2 (two) times daily. Use in each nostril as directed Patient taking differently: Place 2 sprays into both nostrils 2 (two) times daily as needed for allergies. Use in each nostril as directed 10/18/17   [name], PA  Azilsartan Medoxomil (EDARBI) 40 MG TABS Take 40 mg by mouth daily.     [provider]  Blood Glucose Monitoring Suppl (ACCU-CHEK GUIDE) w/Device KIT USE DAILY AS DIRECTED 05/17/17   [name], PA  cetirizine (ZYRTEC) 10 MG tablet TAKE 1 TABLET (10 MG TOTAL) BY MOUTH DAILY. 11/21/17   [name], PA  Cholecalciferol (VITAMIN D) 2000 units CAPS Take 1 capsule (2,000 Units total) by mouth daily. 10/18/17   [name], PA  ciprofloxacin (CIPRO) 500 MG tablet Take 1 tablet (500 mg total) by mouth every 12 (twelve) hours. Patient not taking: Reported on 10/19/2021 07/10/21   Isla Pence, MD  clobetasol ointment (TEMOVATE) 3.32 % Apply 1 application. topically 2 (two) times daily. 05/13/20   [provider]  cyclobenzaprine (FLEXERIL) 10 MG tablet Take 10 mg by mouth 3 (three) times daily as needed for muscle spasms.     [provider]  Dextromethorphan HBr (SCOT-TUSSIN DIABETES) 10 MG/5ML LIQD Take 10  mg by mouth daily as needed (cough).    [provider]  docusate sodium (COLACE) 100 MG capsule TAKE 1 CAPSULE (100 MG TOTAL) BY MOUTH DAILY AS NEEDED FOR MILD CONSTIPATION. 10/23/14   [name], PA  escitalopram (LEXAPRO) 20 MG tablet Take 20 mg by mouth daily 10/18/17   [name], PA  ezetimibe (ZETIA) 10 MG tablet Take 1 tablet (10 mg total) by mouth every evening. 10/18/17   [name], PA  FLOVENT HFA 110 MCG/ACT inhaler Inhale 2 puffs into the lungs 2 (two) times daily as needed (shortness of breath). 08/29/17   [provider]   fluticasone (FLONASE) 50 MCG/ACT nasal spray Place 2 sprays into both nostrils daily. Patient taking differently: Place 2 sprays into both nostrils as needed for allergies. 10/18/17   [name], PA  hydrALAZINE (APRESOLINE) 25 MG tablet Take 25 mg by mouth daily as needed (high blood pressure). 06/01/19   [provider]  hydrocortisone 2.5 % cream Apply 1 application. topically daily as needed (hemorrhoids). 01/01/19   [provider]  loperamide (IMODIUM A-D) 2 MG tablet Take 1-2 mg by mouth 4 (four) times daily as needed for diarrhea or loose stools.    [provider]  LOTEMAX SM 0.38 % GEL Place 1 drop into both eyes as needed (redness). 10/18/17   [provider]  meloxicam (MOBIC) 15 MG tablet TAKE 1 TABLET BY MOUTH EVERY DAY Patient taking differently: Take 15 mg by mouth daily as needed for pain. 02/24/16   [name], PA  metFORMIN (GLUCOPHAGE) 500 MG tablet Take 250 mg by mouth 2 (two) times daily as needed (high blood sugar). 05/14/19   [provider]  metoprolol tartrate (LOPRESSOR) 25 MG tablet Take 0.5 tablets (12.5 mg total) by mouth 2 (two) times daily. Patient taking differently: Take 12.5 mg by mouth daily. 02/11/17   Dixie Dials, MD  metroNIDAZOLE (FLAGYL) 500 MG tablet Take 1 tablet (500 mg total) by mouth 2 (two) times daily. Patient not taking: Reported on 10/19/2021 07/10/21   Isla Pence, MD  Misc. Devices MISC Blood pressure monitor Length of need:  99 months 06/22/20   [provider]  Multiple Vitamin (MULTI-VITAMIN DAILY PO) Take 1 tablet by mouth daily.    [provider]  naloxone [facility]) nasal spray 4 mg/0.1 mL Place 1 spray into the nose as needed (opioid overdose).    [provider]  nitroGLYCERIN (NITROSTAT) 0.3 MG SL tablet Place 0.3 mg under the tongue every 5 (five) minutes as needed.    [provider]  olopatadine (PATANOL) 0.1 % ophthalmic solution Place 1 drop into  both eyes 2 (two) times daily. Patient taking differently: Place 1 drop into both eyes 2 (two) times daily as needed for allergies. 10/18/17   [name], PA  ondansetron (ZOFRAN-ODT) 4 MG disintegrating tablet Take 1 tablet (4 mg total) by mouth every 8 (eight) hours as needed for nausea or vomiting. 07/10/21   Isla Pence, MD  oxyCODONE-acetaminophen (PERCOCET/ROXICET) 5-325 MG tablet Take 2 tablets by mouth every 6 (six) hours as needed for up to 3 days for severe pain. 11/03/21 11/06/21  Janeece Fitting, PA-C  Peppermint Oil (IBGARD PO) Take 1 Dose by mouth as needed (upset stomach).    [provider]  Probiotic Product (PROBIOTIC PO) Take 1 capsule by mouth daily as needed (upset stomach).    [provider]  zolpidem (AMBIEN) 10 MG tablet Take 10 mg by mouth at bedtime as needed  for sleep.  09/28/13   [provider]      Allergies    Codeine, Propoxyphene n-acetaminophen, Sulfonamide derivatives, Alka-seltzer [aspirin effervescent], Amlodipine, Amoxicillin-pot clavulanate, Norvasc [amlodipine besylate], Red dye, Singulair [montelukast sodium], Sulfa antibiotics, Sulfur, Bupropion, Buspirone, and Latex    Review of Systems   Review of Systems  Constitutional:  Negative for fever.  Respiratory:  Negative for shortness of breath.   Cardiovascular:  Negative for chest pain.  Gastrointestinal:  Negative for abdominal pain, diarrhea, nausea and vomiting.  Musculoskeletal:  Positive for arthralgias.  Neurological:  Negative for dizziness and headaches.  All other systems reviewed and are negative.  Physical Exam Updated Vital Signs BP (!) 146/87   Pulse 60   Temp 98.8 F (37.1 C) (Oral)   Resp 18   Ht 5' 7"  (1.702 m)   Wt 73.9 kg   SpO2 98%   BMI 25.53 kg/m  Physical Exam Vitals and nursing note reviewed.  Constitutional:      Appearance: Normal appearance.  HENT:     Head: Normocephalic and atraumatic.     Comments: No visible signs of trauma or  goose eggs noted.     Mouth/Throat:     Mouth: Mucous membranes are moist.  Eyes:     Pupils: Pupils are equal, round, and reactive to light.  Cardiovascular:     Rate and Rhythm: Normal rate.  Pulmonary:     Effort: Pulmonary effort is normal.     Breath sounds: No wheezing.  Abdominal:     General: Abdomen is flat.     Tenderness: There is no abdominal tenderness.  Musculoskeletal:        General: Tenderness present.     Cervical back: Normal range of motion and neck supple.     Right hip: Tenderness and bony tenderness present. No deformity or lacerations. Decreased range of motion. Normal strength.     Comments: Legs are without shortened, no rotations external or internal.  Pulses present.  Decreased range of motion with right leg extension due to pain.  Skin:    General: Skin is warm and dry.  Neurological:     Mental Status: She is alert and oriented to person, place, and time.    ED Results / Procedures / Treatments   Labs (all labs ordered are listed, but only abnormal results are displayed) Labs Reviewed - No data to display  EKG None  Radiology CT PELVIS WO CONTRAST  Result Date: 11/03/2021 CLINICAL DATA:  Fall, right hip pain EXAM: CT PELVIS WITHOUT CONTRAST TECHNIQUE: Multidetector CT imaging of the pelvis was performed following the standard protocol without intravenous contrast. RADIATION DOSE REDUCTION: This exam was performed according to the departmental dose-optimization program which includes automated exposure control, adjustment of the mA and/or kV according to patient size and/or use of iterative reconstruction technique. COMPARISON:  None Available. FINDINGS: Urinary Tract:  Bladder is within normal limits. Bowel: Mild sigmoid diverticulosis, without evidence of diverticulitis. Vascular/Lymphatic: No evidence of aneurysm. No suspicious pelvic lymphadenopathy. Reproductive:  Status post hysterectomy. No adnexal masses. Other:  No pelvic ascites.  Musculoskeletal: No fracture is seen. Specifically, the right hip is intact. Visualized bony pelvis appears intact. IMPRESSION: No fracture is seen. Specifically, the right hip is intact. Electronically Signed   By: Julian Hy M.D.   On: 11/03/2021 19:51   DG Hip Unilat W or Wo Pelvis 2-3 Views Right  Result Date: 11/03/2021 CLINICAL DATA:  Fall with right hip/groin pain. EXAM: DG HIP (WITH  OR WITHOUT PELVIS) 2-3V RIGHT COMPARISON:  Prior right hip films on 02/11/2016 FINDINGS: There is no evidence of hip fracture or dislocation. No significant arthropathy. No bony lesions or destruction. Soft tissues are unremarkable. IMPRESSION: No acute findings. Electronically Signed   By: Aletta Edouard M.D.   On: 11/03/2021 19:17    Procedures Procedures    Medications Ordered in ED Medications  fentaNYL (SUBLIMAZE) injection 100 mcg (100 mcg Intravenous Given 11/03/21 2013)  Tdap (BOOSTRIX) injection 0.5 mL (0.5 mLs Intramuscular Given 11/03/21 2012)  oxyCODONE-acetaminophen (PERCOCET/ROXICET) 5-325 MG per tablet 2 tablet (2 tablets Oral Given 11/03/21 2046)    ED Course/ Medical Decision Making/ A&P                           Medical Decision Making Amount and/or Complexity of Data Reviewed Radiology: ordered.  Risk Prescription drug management.   This patient presents to the ED for concern of fall, this involves a number of treatment options, and is a complaint that carries with it a high risk of complications and morbidity.  The differential diagnosis includes hip fracture versus hip dislocation.    Co morbidities: Discussed in HPI   Brief History:  Patient here status post mechanical fall, reports tripping after trying to chase a cat.  Significant pain along the right groin area with radiation into her groin.  Has not ambulated since the incident occurred.  Worsening pain with weightbearing.  Patient under chronic pain management PCP for fibromyalgia.  EMR reviewed including pt  PMHx, past surgical history and past visits to ER.   See HPI for more details   Imaging Studies:  NAD. I personally reviewed all imaging studies and no acute abnormality found. I agree with radiology interpretation.   Medicines ordered:  I ordered medication including fentanyl, percocet  for pain control  Reevaluation of the patient after these medicines showed that the patient improved I have reviewed the patients home medicines and have made adjustments as needed   Reevaluation:  After the interventions noted above I re-evaluated patient and found that they have :improved   Social Determinants of Health:  The patient's social determinants of health were a factor in the care of this patient    Problem List / ED Course:  Patient here with right hip pain status post mechanical fall.  Exam is unremarkable aside from some pain with extension of her right leg.  Pain along the right femoral neck.  X-ray did not show any acute fracture.  She continues to report pain despite fentanyl will obtain CT to rule out occult fracture.  This is negative also on today's visit.  She was given a second round of fentanyl, she is currently under pain management for her fibromyalgia via PCP.  She does report taking daily Percocet 10 mg for pain control.  I discussed with her that I could fill this medication for a short course of 3 days, she will need to follow-up with her PCP if she is out of her pain medication at this time.   Dispostion:  After consideration of the diagnostic results and the patients response to treatment, I feel that the patent would benefit from outpatient follow-up with PCP to continue chronic pain management.     Portions of this note were generated with [employer]. Dictation errors may occur despite best attempts at proofreading.   Final Clinical Impression(s) / ED Diagnoses Final diagnoses:  Fall in home, initial encounter  Right hip pain    Rx / DC  Orders ED Discharge Orders          Ordered    oxyCODONE-acetaminophen (PERCOCET/ROXICET) 5-325 MG tablet  Every 6 hours PRN,   Status:  Discontinued        11/03/21 2044    oxyCODONE-acetaminophen (PERCOCET/ROXICET) 5-325 MG tablet  Every 6 hours PRN        11/03/21 2047              Janeece Fitting, PA-C 11/03/21 2049    Daleen Bo, MD 11/04/21 0002

## 2021-11-03 NOTE — ED Triage Notes (Signed)
Pt BIB EMS from home, reported falling into a ditch after chasing cat. After multiple attempts to get out of ditch, pt able to dispatch for help. Right hip pain, mostly in the groin area, noted with abrasions on knee and arm. No blood thinners, 18 in LAC, 100 mcg of fentanyl   BP 140/64 P 83 spO2 97% RA CBG 182

## 2021-11-03 NOTE — Discharge Instructions (Addendum)
Your CT today did not show any acute fracture.  You may follow-up with your primary care physician in order to obtain further pain medication.  I provided a few days of pain medication, you will need to follow-up with your primary care as scheduled.

## 2021-11-11 ENCOUNTER — Ambulatory Visit: Payer: Medicare Other | Admitting: Physician Assistant

## 2021-11-17 ENCOUNTER — Ambulatory Visit: Payer: Medicare Other | Admitting: Physician Assistant

## 2021-11-17 DIAGNOSIS — M797 Fibromyalgia: Secondary | ICD-10-CM

## 2021-11-17 DIAGNOSIS — R5383 Other fatigue: Secondary | ICD-10-CM

## 2021-11-17 DIAGNOSIS — I10 Essential (primary) hypertension: Secondary | ICD-10-CM

## 2021-11-17 DIAGNOSIS — M62838 Other muscle spasm: Secondary | ICD-10-CM

## 2021-11-17 DIAGNOSIS — Z8639 Personal history of other endocrine, nutritional and metabolic disease: Secondary | ICD-10-CM

## 2021-11-17 DIAGNOSIS — M5136 Other intervertebral disc degeneration, lumbar region: Secondary | ICD-10-CM

## 2021-11-17 DIAGNOSIS — Z950 Presence of cardiac pacemaker: Secondary | ICD-10-CM

## 2021-11-17 DIAGNOSIS — I495 Sick sinus syndrome: Secondary | ICD-10-CM

## 2021-11-17 DIAGNOSIS — Z8709 Personal history of other diseases of the respiratory system: Secondary | ICD-10-CM

## 2021-11-17 DIAGNOSIS — L409 Psoriasis, unspecified: Secondary | ICD-10-CM

## 2021-11-17 DIAGNOSIS — K5730 Diverticulosis of large intestine without perforation or abscess without bleeding: Secondary | ICD-10-CM

## 2021-11-17 DIAGNOSIS — F5104 Psychophysiologic insomnia: Secondary | ICD-10-CM

## 2021-11-17 DIAGNOSIS — Z8719 Personal history of other diseases of the digestive system: Secondary | ICD-10-CM

## 2021-11-17 DIAGNOSIS — E559 Vitamin D deficiency, unspecified: Secondary | ICD-10-CM

## 2021-11-17 DIAGNOSIS — M8589 Other specified disorders of bone density and structure, multiple sites: Secondary | ICD-10-CM

## 2021-11-17 DIAGNOSIS — L659 Nonscarring hair loss, unspecified: Secondary | ICD-10-CM

## 2021-12-01 LAB — HM DEXA SCAN

## 2021-12-06 ENCOUNTER — Ambulatory Visit (INDEPENDENT_AMBULATORY_CARE_PROVIDER_SITE_OTHER): Payer: Medicare Other

## 2021-12-06 DIAGNOSIS — I495 Sick sinus syndrome: Secondary | ICD-10-CM | POA: Diagnosis not present

## 2021-12-10 LAB — CUP PACEART REMOTE DEVICE CHECK
Battery Remaining Longevity: 36 mo
Battery Voltage: 2.95 V
Brady Statistic AP VP Percent: 0.05 %
Brady Statistic AP VS Percent: 99.85 %
Brady Statistic AS VP Percent: 0 %
Brady Statistic AS VS Percent: 0.1 %
Brady Statistic RA Percent Paced: 99.9 %
Brady Statistic RV Percent Paced: 0.05 %
Date Time Interrogation Session: 20230706165923
Implantable Lead Implant Date: 20160202
Implantable Lead Implant Date: 20160202
Implantable Lead Location: 753859
Implantable Lead Location: 753860
Implantable Lead Model: 5076
Implantable Lead Model: 5076
Implantable Pulse Generator Implant Date: 20160202
Lead Channel Impedance Value: 437 Ohm
Lead Channel Impedance Value: 437 Ohm
Lead Channel Impedance Value: 551 Ohm
Lead Channel Impedance Value: 551 Ohm
Lead Channel Pacing Threshold Amplitude: 0.5 V
Lead Channel Pacing Threshold Amplitude: 0.875 V
Lead Channel Pacing Threshold Pulse Width: 0.4 ms
Lead Channel Pacing Threshold Pulse Width: 0.4 ms
Lead Channel Sensing Intrinsic Amplitude: 19.75 mV
Lead Channel Sensing Intrinsic Amplitude: 19.75 mV
Lead Channel Sensing Intrinsic Amplitude: 3.75 mV
Lead Channel Sensing Intrinsic Amplitude: 3.75 mV
Lead Channel Setting Pacing Amplitude: 2 V
Lead Channel Setting Pacing Amplitude: 2.5 V
Lead Channel Setting Pacing Pulse Width: 0.4 ms
Lead Channel Setting Sensing Sensitivity: 2.8 mV

## 2021-12-14 ENCOUNTER — Ambulatory Visit (INDEPENDENT_AMBULATORY_CARE_PROVIDER_SITE_OTHER): Payer: Medicare Other | Admitting: Podiatry

## 2021-12-14 ENCOUNTER — Encounter: Payer: Self-pay | Admitting: Podiatry

## 2021-12-14 DIAGNOSIS — B351 Tinea unguium: Secondary | ICD-10-CM

## 2021-12-14 DIAGNOSIS — M79675 Pain in left toe(s): Secondary | ICD-10-CM | POA: Diagnosis not present

## 2021-12-14 DIAGNOSIS — E119 Type 2 diabetes mellitus without complications: Secondary | ICD-10-CM | POA: Diagnosis not present

## 2021-12-14 DIAGNOSIS — M79674 Pain in right toe(s): Secondary | ICD-10-CM | POA: Diagnosis not present

## 2021-12-14 DIAGNOSIS — Q828 Other specified congenital malformations of skin: Secondary | ICD-10-CM

## 2021-12-19 NOTE — Progress Notes (Signed)
  Subjective:  Patient ID: [name], female    DOB: Jun 23, 1955,  MRN: [id number]  [name] presents to clinic today for preventative diabetic foot care and painful porokeratotic lesions left lower extremity. Pain prevent(s) comfortable ambulation. Aggravating factor is weightbearing with and without shoegear.  Last A1c was in the 6% range. Patient did not check blood glucose today.  New problem(s): None.   PCP is [name], [place] , and last visit was  November 24, 2021  Allergies  Allergen Reactions   Codeine Other (See Comments)    HALLUCINATIONS    Propoxyphene N-Acetaminophen Other (See Comments)    HALLUCINATIONS   Sulfonamide Derivatives Hives, Itching and Swelling   Alka-Seltzer [Aspirin Effervescent] Other (See Comments)    "like she's fading away", jittery    Amlodipine     In high doses causes stomach issues    Amoxicillin-Pot Clavulanate     In high doses causes stomach issues    Norvasc [Amlodipine Besylate] Other (See Comments)    "passed out"   Red Dye Other (See Comments)    "burning and tingling sensation"    Singulair [Montelukast Sodium] Other (See Comments)    Increased depression, sadness   Sulfa Antibiotics Hives, Itching and Swelling   Sulfur Hives, Itching and Swelling   Bupropion Anxiety and Other (See Comments)    headache   Buspirone Other (See Comments)    Abdominal bloating and increased intestinal gas   Latex Hives and Itching    Review of Systems: Negative except as noted in the HPI.  Objective: No changes noted in today's physical examination. [name] is a pleasant 66 y.o. female in NAD. AAO X 3.  Vascular Examination: CFT immediate b/l LE. Palpable DP/PT pulses b/l LE. Digital hair present b/l. Skin temperature gradient WNL b/l. No pain with calf compression b/l. No edema noted b/l. No cyanosis or clubbing noted b/l LE.  Dermatological Examination: No open wounds b/l LE. No interdigital macerations noted b/l LE. Toenails 1-5 b/l  are painful, elongated, discolored, dystrophic with subungual debris. Pain with dorsal palpation of nailplates. No erythema, no edema, no drainage noted. Porokeratotic lesion(s) left forefoot tender to palpation.  No erythema, no edema, no drainage, no fluctuance.  Scaly patches consistent with psoriasis.  Neurological Examination: Pt has subjective symptoms of neuropathy. Protective sensation intact 5/5 intact bilaterally with 10g monofilament b/l.  Musculoskeletal Examination: Muscle strength 5/5 to all lower extremity muscle groups bilaterally. HAV with bunion deformity noted b/l LE.  Assessment/Plan: 1. Pain due to onychomycosis of toenails of both feet   2. Porokeratosis   3. Controlled type 2 diabetes mellitus without complication, without long-term current use of insulin ([place])      -Examined patient. -Continue foot and shoe inspections daily. Monitor blood glucose per PCP/Endocrinologist's recommendations. -Mycotic toenails 1-5 bilaterally were debrided in length and girth with sterile nail nippers and dremel without incident. -Porokeratotic lesion(s) left forefoot pared and enucleated with sterile scalpel blade without incident. Total number of lesions debrided=1. -Patient/POA to call should there be question/concern in the interim.   Return in about 9 weeks (around 02/15/2022).  Marzetta Board, DPM

## 2021-12-30 NOTE — Progress Notes (Signed)
Remote pacemaker transmission.   

## 2022-03-07 ENCOUNTER — Ambulatory Visit (INDEPENDENT_AMBULATORY_CARE_PROVIDER_SITE_OTHER): Payer: Medicare Other

## 2022-03-07 DIAGNOSIS — I495 Sick sinus syndrome: Secondary | ICD-10-CM

## 2022-03-09 LAB — CUP PACEART REMOTE DEVICE CHECK
Battery Remaining Longevity: 35 mo
Battery Voltage: 2.95 V
Brady Statistic AP VP Percent: 0.04 %
Brady Statistic AP VS Percent: 99.82 %
Brady Statistic AS VP Percent: 0 %
Brady Statistic AS VS Percent: 0.13 %
Brady Statistic RA Percent Paced: 99.87 %
Brady Statistic RV Percent Paced: 0.04 %
Date Time Interrogation Session: 20231004110139
Implantable Lead Implant Date: 20160202
Implantable Lead Implant Date: 20160202
Implantable Lead Location: 753859
Implantable Lead Location: 753860
Implantable Lead Model: 5076
Implantable Lead Model: 5076
Implantable Pulse Generator Implant Date: 20160202
Lead Channel Impedance Value: 399 Ohm
Lead Channel Impedance Value: 418 Ohm
Lead Channel Impedance Value: 494 Ohm
Lead Channel Impedance Value: 513 Ohm
Lead Channel Pacing Threshold Amplitude: 0.5 V
Lead Channel Pacing Threshold Amplitude: 0.875 V
Lead Channel Pacing Threshold Pulse Width: 0.4 ms
Lead Channel Pacing Threshold Pulse Width: 0.4 ms
Lead Channel Sensing Intrinsic Amplitude: 16 mV
Lead Channel Sensing Intrinsic Amplitude: 16 mV
Lead Channel Sensing Intrinsic Amplitude: 3.25 mV
Lead Channel Sensing Intrinsic Amplitude: 3.25 mV
Lead Channel Setting Pacing Amplitude: 2 V
Lead Channel Setting Pacing Amplitude: 2.5 V
Lead Channel Setting Pacing Pulse Width: 0.4 ms
Lead Channel Setting Sensing Sensitivity: 2.8 mV

## 2022-03-22 ENCOUNTER — Ambulatory Visit (INDEPENDENT_AMBULATORY_CARE_PROVIDER_SITE_OTHER): Payer: Medicare Other | Admitting: Podiatry

## 2022-03-22 ENCOUNTER — Encounter: Payer: Self-pay | Admitting: Podiatry

## 2022-03-22 DIAGNOSIS — E119 Type 2 diabetes mellitus without complications: Secondary | ICD-10-CM

## 2022-03-22 DIAGNOSIS — M79674 Pain in right toe(s): Secondary | ICD-10-CM | POA: Diagnosis not present

## 2022-03-22 DIAGNOSIS — B351 Tinea unguium: Secondary | ICD-10-CM

## 2022-03-22 DIAGNOSIS — M79675 Pain in left toe(s): Secondary | ICD-10-CM

## 2022-03-22 DIAGNOSIS — Q828 Other specified congenital malformations of skin: Secondary | ICD-10-CM | POA: Diagnosis not present

## 2022-03-22 NOTE — Progress Notes (Signed)
Remote pacemaker transmission.   

## 2022-03-22 NOTE — Progress Notes (Signed)
  Subjective:  Patient ID: [name], female    DOB: 29-Aug-1955,  MRN: [id number]  [name] presents to clinic today for:  Chief Complaint  Patient presents with   Nail Problem    Diabetic foot care BS-did not check today A1C-6.5 PCP-[name] Jeffery PCP VST-02/2022    New problem(s): None.   PCP is [name], [place].  Allergies  Allergen Reactions   Codeine Other (See Comments)    HALLUCINATIONS    Propoxyphene N-Acetaminophen Other (See Comments)    HALLUCINATIONS   Sulfonamide Derivatives Hives, Itching and Swelling   Alka-Seltzer [Aspirin Effervescent] Other (See Comments)    "like she's fading away", jittery    Amlodipine     In high doses causes stomach issues    Amoxicillin-Pot Clavulanate     In high doses causes stomach issues    Norvasc [Amlodipine Besylate] Other (See Comments)    "passed out"   Red Dye Other (See Comments)    "burning and tingling sensation"    Singulair [Montelukast Sodium] Other (See Comments)    Increased depression, sadness   Sulfa Antibiotics Hives, Itching and Swelling   Sulfur Hives, Itching and Swelling   Bupropion Anxiety and Other (See Comments)    headache   Buspirone Other (See Comments)    Abdominal bloating and increased intestinal gas   Latex Hives and Itching    Review of Systems: Negative except as noted in the HPI.  Objective:   [name] is a pleasant 66 y.o. female WD, WN in NAD. AAO x 3.  Vascular Examination: CFT immediate b/l LE. Palpable DP/PT pulses b/l LE. Digital hair present b/l. Skin temperature gradient WNL b/l. No pain with calf compression b/l. No edema noted b/l. No cyanosis or clubbing noted b/l LE.  Dermatological Examination: No open wounds b/l LE. No interdigital macerations noted b/l LE. Toenails 1-5 b/l are painful, elongated, discolored, dystrophic with subungual debris. Pain with dorsal palpation of nailplates. No erythema, no edema, no drainage noted.   Porokeratotic lesion(s) left  forefoot submet head 2 tender to palpation.  No erythema, no edema, no drainage, no fluctuance.  Scaly patches consistent with psoriasis.  Neurological Examination: Pt has subjective symptoms of neuropathy. Protective sensation intact 5/5 intact bilaterally with 10g monofilament b/l.  Musculoskeletal Examination: Muscle strength 5/5 to all lower extremity muscle groups bilaterally. HAV with bunion deformity noted b/l LE.  Assessment/Plan: 1. Pain due to onychomycosis of toenails of both feet   2. Porokeratosis   3. Controlled type 2 diabetes mellitus without complication, without long-term current use of insulin ([place])     No orders of the defined types were placed in this encounter.   -Patient was evaluated and treated. All patient's and/or POA's questions/concerns answered on today's visit. -Continue diabetic foot care principles: inspect feet daily, monitor glucose as recommended by PCP and/or Endocrinologist, and follow prescribed diet per PCP, Endocrinologist and/or dietician. -Toenails 1-5 b/l were debrided in length and girth with sterile nail nippers and dremel without iatrogenic bleeding.  -Porokeratotic lesion(s) submet head 2 left foot pared and enucleated with sterile currette without incident. Total number of lesions debrided=1. -Patient/POA to call should there be question/concern in the interim.   Return in about 3 months (around 06/22/2022).  Marzetta Board, DPM

## 2022-05-02 NOTE — Progress Notes (Unsigned)
Office Visit Note  Patient: [name]             Date of Birth: 10-Jan-1956           MRN: [id number]             PCP: [name], PA Referring: [name], PA Visit Date: 05/03/2022 Occupation: @GUAROCC @  Subjective:  Pain in multiple joints  History of Present Illness: [name] is a 66 y.o. female history of osteoarthritis, degenerative disease and fibromyalgia syndrome.  She states she has been having pain all over her body.  She states she fell in June and since then she has been having increased pain.  She describes discomfort in her right shoulder, bilateral hips and her right knee joint.  She continues to have discomfort in the trapezius region.  She had been going to physical therapy but has not gone in the last 3 weeks.  She states she was having some chest pain after physical therapy and her cardiologist advised her not to do physical therapy for some time.  Activities of Daily Living:  Patient reports morning stiffness for all day. Patient Reports nocturnal pain.  Difficulty dressing/grooming: Reports Difficulty climbing stairs: Reports Difficulty getting out of chair: Reports Difficulty using hands for taps, buttons, cutlery, and/or writing: Reports  Review of Systems  Constitutional:  Positive for fatigue.  HENT:  Positive for mouth dryness. Negative for mouth sores.   Eyes:  Positive for dryness.  Respiratory:  Negative for shortness of breath.   Cardiovascular:  Negative for chest pain and palpitations.  Gastrointestinal:  Negative for blood in stool, constipation and diarrhea.  Endocrine: Negative for increased urination.  Genitourinary:  Negative for involuntary urination.  Musculoskeletal:  Positive for joint pain, gait problem, joint pain, joint swelling, myalgias, muscle weakness, morning stiffness, muscle tenderness and myalgias.  Skin:  Positive for hair loss. Negative for color change, rash and sensitivity to sunlight.  Allergic/Immunologic:  Positive for susceptible to infections.  Neurological:  Negative for dizziness and headaches.  Hematological:  Negative for swollen glands.  Psychiatric/Behavioral:  Positive for depressed mood and sleep disturbance. The patient is nervous/anxious.     PMFS History:  Patient Active Problem List   Diagnosis Date Noted   Chronic prescription benzodiazepine use 10/20/2021   Chronic prescription opiate use 10/20/2021   Vasomotor instability 10/20/2021   Estrogen deficiency 10/20/2021   Prediabetes 09/03/2021   Dyshidrosis (pompholyx) 11/24/2020   Abdominal pain, LLQ 08/10/2020   Arthritis 08/10/2020   Change in bowel habit 08/10/2020   Colon cancer screening 08/10/2020   Diverticulitis of colon 08/10/2020   Flatulence, eructation and gas pain 08/10/2020   Hemorrhage of colon due to diverticulosis 08/10/2020   Nausea 08/10/2020   Vomiting without nausea 08/10/2020   Irritable bowel syndrome with diarrhea 01/21/2020   Laryngopharyngeal reflux (LPR) 05/24/2018   TMJ (sprain of temporomandibular joint), initial encounter 05/24/2018   Aortic atherosclerosis ([place]) 10/18/2017   Acute coronary syndrome ([place]) 02/09/2017   Right knee pain 07/04/2016   Pain in right ankle and joints of right foot 07/04/2016   Psoriasis 03/10/2015   Right foot pain 11/13/2014   Ethmoid sinusitis 11/13/2014   Abdominal pain, epigastric 11/13/2014   Vitamin D deficiency 11/13/2014   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   Sick sinus syndrome ([place]) 07/08/2014   Chest pain at rest 07/05/2014   Chronic rhinitis 10/22/2013   Fatty liver 10/22/2013   Interstitial cystitis 10/22/2013   Hip pain, right 10/22/2013  NASH (nonalcoholic steatohepatitis) 10/22/2013   Chronic insomnia 09/25/2012   HTN (hypertension) 09/25/2012   Hyperlipidemia 09/25/2012   Hyperlipidemia associated with type 2 diabetes mellitus ([place]) 09/25/2012   Diabetes mellitus type 2, controlled, without complications ([place]) 18/84/1660   GERD 12/04/2008    History of colonic polyps 12/04/2008   CONSTIPATION 10/21/2008   Fibromyalgia 10/21/2008   LACTOSE INTOLERANCE 11/20/2007   Chest pain, precordial 11/20/2007   Lactose intolerance 11/20/2007   Anxiety and depression 08/02/2007   Osteoporosis 08/02/2007   Recurrent major depressive disorder, in remission ([place]) 08/02/2007   Moderate episode of recurrent major depressive disorder ([place]) 08/02/2007   Diverticulosis of large intestine 11/16/2006   EXTERNAL HEMORRHOIDS 05/12/2005    Past Medical History:  Diagnosis Date   Allergy    Anal fissure 02/2012   Anxiety    Arthritis    Asthma    Bursitis    right hip   Complication of anesthesia     woke up during a colonscopy, difficult to wake up post-op sometimes , requires lots of blankets to stay warm post op   Depression    Diabetes mellitus without complication ([place])    type 2 diet controlled   Fatty liver    sees dr Collene Mares for   Fibromyalgia    Gallstones 02/15/2015   GERD (gastroesophageal reflux disease)    Hepatitis A infection 1979   Hyperlipidemia    Hypertension    Irritable bowel syndrome (IBS)    Presence of permanent cardiac pacemaker 07/2014   sees dr allred   Psoriasis    left foot   Symptomatic bradycardia    a. s/p MDT MRI compatible dual chamber pacemaker 07/2014 Dr Allred   Tarsal tunnel syndrome of left side     Family History  Problem Relation Age of Onset   Cancer Mother        liver   Diabetes Mother    Hyperlipidemia Mother    Hypertension Mother    Stroke Mother    Cancer Maternal Grandmother        breast   Anesthesia problems Sister        hard to wake up post-op   Heart disease Father    Hyperlipidemia Brother    HIV/AIDS Brother    Cancer Maternal Grandfather        colon   Diabetes Brother    Cancer Brother        lung   Cancer Paternal Grandfather        COLON   Past Surgical History:  Procedure Laterality Date   ABDOMINAL HYSTERECTOMY  09/1994   partial   APPENDECTOMY      BILATERAL SALPINGOOPHORECTOMY Bilateral 04/2011   CARDIAC CATHETERIZATION  10/08/2007   CESAREAN SECTION     x 1   CHOLECYSTECTOMY N/A 02/16/2015   Procedure: LAPAROSCOPIC CHOLECYSTECTOMY WITH INTRAOPERATIVE CHOLANGIOGRAM;  Surgeon: Johnathan Hausen, MD;  Location: [facility];  Service: General;  Laterality: N/A;   COLONOSCOPY WITH PROPOFOL N/A 08/19/2016   Procedure: COLONOSCOPY WITH PROPOFOL;  Surgeon: Carol Ada, MD;  Location: [facility];  Service: Endoscopy;  Laterality: N/A;   COLONOSCOPY WITH PROPOFOL N/A 10/22/2021   Procedure: COLONOSCOPY WITH PROPOFOL;  Surgeon: Carol Ada, MD;  Location: [facility];  Service: Gastroenterology;  Laterality: N/A;   CYSTOSCOPY W/ URETERAL STENT PLACEMENT  08/04/2009   EXAMINATION UNDER ANESTHESIA  02/17/2012   Procedure: EXAM UNDER ANESTHESIA;  Surgeon: Pedro Earls, MD;  Location: [facility];  Service: General;  Laterality: N/A;  Exam under anesthesia with lateral internal sphincterotomy   LAPAROSCOPIC LYSIS INTESTINAL ADHESIONS  08/04/2009   LEFT HEART CATH AND CORONARY ANGIOGRAPHY N/A 02/10/2017   Procedure: LEFT HEART CATH AND CORONARY ANGIOGRAPHY;  Surgeon: Dixie Dials, MD;  Location: [facility];  Service: Cardiovascular;  Laterality: N/A;   PERMANENT PACEMAKER INSERTION N/A 07/08/2014   MDT MRI compatible dual chamber pacemaker implanted by Dr Rayann Heman for symptomatic bradycardia   POLYPECTOMY  10/22/2021   Procedure: POLYPECTOMY;  Surgeon: Carol Ada, MD;  Location: [facility];  Service: Gastroenterology;;   SHOULDER ARTHROSCOPY W/ ROTATOR CUFF REPAIR Left 1999   SMALL INTESTINE SURGERY     SPHINCTEROTOMY  02/17/2012   Procedure: SPHINCTEROTOMY;  Surgeon: Pedro Earls, MD;  Location: [facility];  Service: General;  Laterality: N/A;   Social History   Social History Narrative   Lives alone.  Daughter and granddaughter live in [place], [place]. Pt exercise sometimes.      Immunization History   Administered Date(s) Administered   Influenza,inj,Quad PF,6+ Mos 03/10/2015, 04/12/2016, 03/24/2017   Influenza-Unspecified 03/17/2014   PFIZER(Purple Top)SARS-COV-2 Vaccination 10/02/2019, 10/23/2019, 05/12/2020   Pfizer Covid-19 Vaccine Bivalent Booster 33yr & up 05/23/2021   Pneumococcal Polysaccharide-23 03/10/2015   Td 02/16/2012   Tdap 02/16/2012, 11/03/2021     Objective: Vital Signs: BP 114/74 (BP Location: Right Arm, Patient Position: Sitting, Cuff Size: Normal)   Pulse 70   Resp 18   Ht 5' 7"  (1.702 m)   Wt 173 lb 12.8 oz (78.8 kg)   BMI 27.22 kg/m    Physical Exam Vitals and nursing note reviewed.  Constitutional:      Appearance: She is well-developed.  HENT:     Head: Normocephalic and atraumatic.  Eyes:     Conjunctiva/sclera: Conjunctivae normal.  Cardiovascular:     Rate and Rhythm: Normal rate and regular rhythm.     Heart sounds: Normal heart sounds.  Pulmonary:     Effort: Pulmonary effort is normal.     Breath sounds: Normal breath sounds.  Abdominal:     General: Bowel sounds are normal.     Palpations: Abdomen is soft.  Musculoskeletal:     Cervical back: Normal range of motion.  Lymphadenopathy:     Cervical: No cervical adenopathy.  Skin:    General: Skin is warm and dry.     Capillary Refill: Capillary refill takes less than 2 seconds.  Neurological:     Mental Status: She is alert and oriented to person, place, and time.  Psychiatric:        Behavior: Behavior normal.      Musculoskeletal Exam: Cervical spine was in good range of motion.  She had bilateral trapezius spasm.  She had good range of motion of thoracic and lumbar spine.  Shoulder joints, elbow joints, wrist joints with good range of motion.  She had bilateral PIP and DIP thickening with no synovitis.  She had tenderness over bilateral trochanteric bursa.  Discomfort range of motion of her knee joint without any warmth swelling or effusion.  Generalized hyperalgesia and  positive tender points were noted.  CDAI Exam: CDAI Score: -- Patient Global: --; Provider Global: -- Swollen: --; Tender: -- Joint Exam 05/03/2022   No joint exam has been documented for this visit   There is currently no information documented on the homunculus. Go to the Rheumatology activity and complete the homunculus joint exam.  Investigation: No additional findings.  Imaging: No results found.  Recent Labs: Lab Results  Component Value Date   WBC 8.4 07/10/2021   HGB 13.7 07/10/2021   PLT 276 07/10/2021   NA 136 07/10/2021   K 3.5 07/10/2021   CL 103 07/10/2021   CO2 24 07/10/2021   GLUCOSE 135 (H) 07/10/2021   BUN 6 (L) 07/10/2021   CREATININE 0.72 07/10/2021   BILITOT 0.6 07/10/2021   ALKPHOS 44 07/10/2021   AST 28 07/10/2021   ALT 37 07/10/2021   PROT 7.5 07/10/2021   ALBUMIN 4.0 07/10/2021   CALCIUM 8.9 07/10/2021   GFRAA 110 10/10/2019    Speciality Comments: No specialty comments available.  Procedures:  Large Joint Inj: L greater trochanter on 05/03/2022 3:05 PM Indications: pain Details: 27 G 1.5 in needle, lateral approach  Arthrogram: No  Medications: 1.5 mL lidocaine 1 %; 40 mg triamcinolone acetonide 40 MG/ML Aspirate: 0 mL Outcome: tolerated well, no immediate complications Procedure, treatment alternatives, risks and benefits explained, specific risks discussed. Consent was given by the patient. Immediately prior to procedure a time out was called to verify the correct patient, procedure, equipment, support staff and site/side marked as required. Patient was prepped and draped in the usual sterile fashion.     Allergies: Codeine, Propoxyphene n-acetaminophen, Sulfonamide derivatives, Alka-seltzer [aspirin effervescent], Amlodipine, Amoxicillin-pot clavulanate, Norvasc [amlodipine besylate], Red dye, Singulair [montelukast sodium], Sulfa antibiotics, Sulfur, Bupropion, Buspirone, and Latex   Assessment / Plan:     Visit Diagnoses:  Fibromyalgia-she continues to have generalized pain and discomfort from fibromyalgia.  She had positive tender points and hyperalgesia.  She is on naloxone and Effexor.  Trapezius muscle spasm-she continues to have some trapezius spasm more so on the left side.  Advised her to contact us if her symptoms get worse.  Chronic insomnia -she is on Ambien 10 mg 1 tablet by mouth at bedtime as needed for insomnia.  Good sleep hygiene was discussed.  Other fatigue-she continues to have fatigue due to fibromyalgia and insomnia.  Trochanteric bursitis of both hips-she has been having pain and discomfort in her bilateral trochanteric region worse on the left side.  Per patient's request left trochanteric bursa was injected with lidocaine and cortisone as described above.  Patient tolerated the procedure well.  Postprocedure instructions were given.  A handout on IT band stretches was given.  Chronic pain of right knee-she has off-and-on pain in her right knee joint.  She good range of motion without any warmth swelling or effusion.  I believe this is related to left trochanteric bursitis and favoring her left side.  DDD (degenerative disc disease), lumbar-she continues to have some lower back pain.  Core strengthening exercises were discussed.  She should continue physical therapy after her symptoms get better.  Psoriasis-he uses topical agents.  Osteopenia of multiple sites - DEXA 07/04/2017 AP spine BMD 0.920 with T score -2.1.  Use of calcium and vitamin D with wrist exercises were discussed.  She will need repeat DEXA scan.  I advised her to discuss that further with her PCP.  Vitamin D deficiency-she is on vitamin D supplement.  Other medical problems are listed as follows:  Diverticulosis of large intestine without hemorrhage  Hair loss  History of IBS  History of hypercholesterolemia  History of asthma  Essential hypertension-blood pressure was normal today.  Diverticulitis of colon -  07/2020.  According to patient she had diverticulitis but was not treated with antibiotics.  Pacemaker  History of gastroesophageal reflux (GERD)  Sick sinus syndrome ([facility])  Anxiety and  depression  Orders: Orders Placed This Encounter  Procedures   Large Joint Inj: L greater trochanter   No orders of the defined types were placed in this encounter.   Follow-Up Instructions: Return in about 6 months (around 11/01/2022) for Osteoarthritis, DDD, osteopenia, fibromyalgia.   Bo Merino, MD  Note - This record has been created using Editor, commissioning.  Chart creation errors have been sought, but may not always  have been located. Such creation errors do not reflect on  the standard of medical care.

## 2022-05-03 ENCOUNTER — Encounter: Payer: Self-pay | Admitting: Rheumatology

## 2022-05-03 ENCOUNTER — Ambulatory Visit: Payer: Medicare Other | Attending: Rheumatology | Admitting: Rheumatology

## 2022-05-03 VITALS — BP 114/74 | HR 70 | Resp 18 | Ht 67.0 in | Wt 173.8 lb

## 2022-05-03 DIAGNOSIS — E559 Vitamin D deficiency, unspecified: Secondary | ICD-10-CM

## 2022-05-03 DIAGNOSIS — Z950 Presence of cardiac pacemaker: Secondary | ICD-10-CM

## 2022-05-03 DIAGNOSIS — M5136 Other intervertebral disc degeneration, lumbar region: Secondary | ICD-10-CM

## 2022-05-03 DIAGNOSIS — M797 Fibromyalgia: Secondary | ICD-10-CM | POA: Diagnosis not present

## 2022-05-03 DIAGNOSIS — M7062 Trochanteric bursitis, left hip: Secondary | ICD-10-CM | POA: Diagnosis not present

## 2022-05-03 DIAGNOSIS — M25561 Pain in right knee: Secondary | ICD-10-CM

## 2022-05-03 DIAGNOSIS — F32A Depression, unspecified: Secondary | ICD-10-CM

## 2022-05-03 DIAGNOSIS — G8929 Other chronic pain: Secondary | ICD-10-CM

## 2022-05-03 DIAGNOSIS — M7061 Trochanteric bursitis, right hip: Secondary | ICD-10-CM

## 2022-05-03 DIAGNOSIS — F5104 Psychophysiologic insomnia: Secondary | ICD-10-CM | POA: Diagnosis not present

## 2022-05-03 DIAGNOSIS — K5732 Diverticulitis of large intestine without perforation or abscess without bleeding: Secondary | ICD-10-CM

## 2022-05-03 DIAGNOSIS — M8589 Other specified disorders of bone density and structure, multiple sites: Secondary | ICD-10-CM

## 2022-05-03 DIAGNOSIS — I495 Sick sinus syndrome: Secondary | ICD-10-CM

## 2022-05-03 DIAGNOSIS — M62838 Other muscle spasm: Secondary | ICD-10-CM | POA: Diagnosis not present

## 2022-05-03 DIAGNOSIS — L659 Nonscarring hair loss, unspecified: Secondary | ICD-10-CM

## 2022-05-03 DIAGNOSIS — I10 Essential (primary) hypertension: Secondary | ICD-10-CM

## 2022-05-03 DIAGNOSIS — Z8719 Personal history of other diseases of the digestive system: Secondary | ICD-10-CM

## 2022-05-03 DIAGNOSIS — R5383 Other fatigue: Secondary | ICD-10-CM | POA: Diagnosis not present

## 2022-05-03 DIAGNOSIS — K5730 Diverticulosis of large intestine without perforation or abscess without bleeding: Secondary | ICD-10-CM

## 2022-05-03 DIAGNOSIS — L409 Psoriasis, unspecified: Secondary | ICD-10-CM

## 2022-05-03 DIAGNOSIS — F419 Anxiety disorder, unspecified: Secondary | ICD-10-CM

## 2022-05-03 DIAGNOSIS — Z8709 Personal history of other diseases of the respiratory system: Secondary | ICD-10-CM

## 2022-05-03 DIAGNOSIS — Z8639 Personal history of other endocrine, nutritional and metabolic disease: Secondary | ICD-10-CM

## 2022-05-03 MED ORDER — TRIAMCINOLONE ACETONIDE 40 MG/ML IJ SUSP
40.0000 mg | INTRAMUSCULAR | Status: AC | PRN
  Administered 2022-05-03: 40 mg via INTRA_ARTICULAR

## 2022-05-03 MED ORDER — LIDOCAINE HCL 1 % IJ SOLN
1.5000 mL | INTRAMUSCULAR | Status: AC | PRN
  Administered 2022-05-03: 1.5 mL

## 2022-05-03 NOTE — Patient Instructions (Signed)
Iliotibial Band Syndrome Rehab Ask your health care provider which exercises are safe for you. Do exercises exactly as told by your health care provider and adjust them as directed. It is normal to feel mild stretching, pulling, tightness, or discomfort as you do these exercises. Stop right away if you feel sudden pain or your pain gets significantly worse. Do not begin these exercises until told by your health care provider. Stretching and range-of-motion exercises These exercises warm up your muscles and joints and improve the movement and flexibility of your hip and pelvis. Quadriceps stretch, prone  Lie on your abdomen (prone position) on a firm surface, such as a bed or padded floor. Bend your left / right knee and reach back to hold your ankle or pant leg. If you cannot reach your ankle or pant leg, loop a belt around your foot and grab the belt instead. Gently pull your heel toward your buttocks. Your knee should not slide out to the side. You should feel a stretch in the front of your thigh and knee (quadriceps). Hold this position for __________ seconds. Repeat __________ times. Complete this exercise __________ times a day. Iliotibial band stretch An iliotibial band is a strong band of muscle tissue that runs from the outer side of your hip to the outer side of your thigh and knee. Lie on your side with your left / right leg in the top position. Bend both of your knees and grab your left / right ankle. Stretch out your bottom arm to help you balance. Slowly bring your top knee back so your thigh goes behind your trunk. Slowly lower your top leg toward the floor until you feel a gentle stretch on the outside of your left / right hip and thigh. If you do not feel a stretch and your knee will not fall farther, place the heel of your other foot on top of your knee and pull your knee down toward the floor with your foot. Hold this position for __________ seconds. Repeat __________ times.  Complete this exercise __________ times a day. Strengthening exercises These exercises build strength and endurance in your hip and pelvis. Endurance is the ability to use your muscles for a long time, even after they get tired. Straight leg raises, side-lying This exercise strengthens the muscles that rotate the leg at the hip and move it away from your body (hip abductors). Lie on your side with your left / right leg in the top position. Lie so your head, shoulder, hip, and knee line up. You may bend your bottom knee to help you balance. Roll your hips slightly forward so your hips are stacked directly over each other and your left / right knee is facing forward. Tense the muscles in your outer thigh and lift your top leg 4-6 inches (10-15 cm). Hold this position for __________ seconds. Slowly lower your leg to return to the starting position. Let your muscles relax completely before doing another repetition. Repeat __________ times. Complete this exercise __________ times a day. Leg raises, prone This exercise strengthens the muscles that move the hips backward (hip extensors). Lie on your abdomen (prone position) on your bed or a firm surface. You can put a pillow under your hips if that is more comfortable for your lower back. Bend your left / right knee so your foot is straight up in the air. Squeeze your buttocks muscles and lift your left / right thigh off the bed. Do not let your back arch. Tense  your thigh muscle as hard as you can without increasing any knee pain. Hold this position for __________ seconds. Slowly lower your leg to return to the starting position and allow it to relax completely. Repeat __________ times. Complete this exercise __________ times a day. Hip hike Stand sideways on a bottom step. Stand on your left / right leg with your other foot unsupported next to the step. You can hold on to a railing or wall for balance if needed. Keep your knees straight and your  torso square. Then lift your left / right hip up toward the ceiling. Slowly let your left / right hip lower toward the floor, past the starting position. Your foot should get closer to the floor. Do not lean or bend your knees. Repeat __________ times. Complete this exercise __________ times a day. This information is not intended to replace advice given to you by your health care provider. Make sure you discuss any questions you have with your health care provider. Document Revised: 07/31/2019 Document Reviewed: 07/31/2019 Elsevier Patient Education  [place].

## 2022-06-07 ENCOUNTER — Ambulatory Visit (INDEPENDENT_AMBULATORY_CARE_PROVIDER_SITE_OTHER): Payer: Medicare Other

## 2022-06-07 DIAGNOSIS — I495 Sick sinus syndrome: Secondary | ICD-10-CM

## 2022-06-08 LAB — CUP PACEART REMOTE DEVICE CHECK
Battery Remaining Longevity: 33 mo
Battery Voltage: 2.94 V
Brady Statistic AP VP Percent: 0.04 %
Brady Statistic AP VS Percent: 99.85 %
Brady Statistic AS VP Percent: 0 %
Brady Statistic AS VS Percent: 0.12 %
Brady Statistic RA Percent Paced: 99.88 %
Brady Statistic RV Percent Paced: 0.04 %
Date Time Interrogation Session: 20240103112538
Implantable Lead Connection Status: 753985
Implantable Lead Connection Status: 753985
Implantable Lead Implant Date: 20160202
Implantable Lead Implant Date: 20160202
Implantable Lead Location: 753859
Implantable Lead Location: 753860
Implantable Lead Model: 5076
Implantable Lead Model: 5076
Implantable Pulse Generator Implant Date: 20160202
Lead Channel Impedance Value: 399 Ohm
Lead Channel Impedance Value: 418 Ohm
Lead Channel Impedance Value: 513 Ohm
Lead Channel Impedance Value: 513 Ohm
Lead Channel Pacing Threshold Amplitude: 0.5 V
Lead Channel Pacing Threshold Amplitude: 0.875 V
Lead Channel Pacing Threshold Pulse Width: 0.4 ms
Lead Channel Pacing Threshold Pulse Width: 0.4 ms
Lead Channel Sensing Intrinsic Amplitude: 17 mV
Lead Channel Sensing Intrinsic Amplitude: 17 mV
Lead Channel Sensing Intrinsic Amplitude: 3.125 mV
Lead Channel Sensing Intrinsic Amplitude: 3.125 mV
Lead Channel Setting Pacing Amplitude: 2 V
Lead Channel Setting Pacing Amplitude: 2.5 V
Lead Channel Setting Pacing Pulse Width: 0.4 ms
Lead Channel Setting Sensing Sensitivity: 2.8 mV
Zone Setting Status: 755011
Zone Setting Status: 755011

## 2022-07-06 ENCOUNTER — Ambulatory Visit
Admission: RE | Admit: 2022-07-06 | Discharge: 2022-07-06 | Disposition: A | Discharge status: Home / Self Care | Payer: Medicare Other | Source: Ambulatory Visit | Attending: Cardiovascular Disease | Admitting: Cardiovascular Disease

## 2022-07-06 ENCOUNTER — Other Ambulatory Visit: Payer: Self-pay | Admitting: Cardiovascular Disease

## 2022-07-06 DIAGNOSIS — M25561 Pain in right knee: Secondary | ICD-10-CM

## 2022-07-08 NOTE — Progress Notes (Signed)
Remote pacemaker transmission.   

## 2022-07-11 ENCOUNTER — Ambulatory Visit: Payer: Medicare Other | Admitting: Podiatry

## 2022-07-12 ENCOUNTER — Ambulatory Visit: Payer: Medicare Other | Admitting: Podiatry

## 2022-07-25 ENCOUNTER — Encounter: Payer: Self-pay | Admitting: Podiatry

## 2022-07-25 ENCOUNTER — Ambulatory Visit (INDEPENDENT_AMBULATORY_CARE_PROVIDER_SITE_OTHER): Payer: Medicare Other | Admitting: Podiatry

## 2022-07-25 VITALS — BP 146/90

## 2022-07-25 DIAGNOSIS — E119 Type 2 diabetes mellitus without complications: Secondary | ICD-10-CM

## 2022-07-25 DIAGNOSIS — M79675 Pain in left toe(s): Secondary | ICD-10-CM

## 2022-07-25 DIAGNOSIS — B351 Tinea unguium: Secondary | ICD-10-CM

## 2022-07-25 DIAGNOSIS — M79674 Pain in right toe(s): Secondary | ICD-10-CM | POA: Diagnosis not present

## 2022-07-25 DIAGNOSIS — Q828 Other specified congenital malformations of skin: Secondary | ICD-10-CM

## 2022-07-25 NOTE — Progress Notes (Signed)
Subjective:  Patient ID: [name], female    DOB: 1956-01-22,  MRN: [id number]  SEMONE SCHERER presents to clinic today for preventative diabetic foot care and painful porokeratotic lesion(s) left lower extremity and painful mycotic toenails that limit ambulation. Painful toenails interfere with ambulation. Aggravating factors include wearing enclosed shoe gear. Pain is relieved with periodic professional debridement. Painful porokeratotic lesions are aggravated when weightbearing with and without shoegear. Pain is relieved with periodic professional debridement.  Chief Complaint  Patient presents with   Nail Problem    DFC BS-did not check today A1C-6.? PCP-[name] PCP VST-05/2022   New problem(s): None.   PCP is [name], PA.  Allergies  Allergen Reactions   Codeine Other (See Comments)    HALLUCINATIONS    Propoxyphene N-Acetaminophen Other (See Comments)    HALLUCINATIONS   Sulfonamide Derivatives Hives, Itching and Swelling   Alka-Seltzer [Aspirin Effervescent] Other (See Comments)    "like she's fading away", jittery    Amlodipine     In high doses causes stomach issues    Amoxicillin-Pot Clavulanate     In high doses causes stomach issues    Norvasc [Amlodipine Besylate] Other (See Comments)    "passed out"   Red Dye Other (See Comments)    "burning and tingling sensation"    Singulair [Montelukast Sodium] Other (See Comments)    Increased depression, sadness   Sulfa Antibiotics Hives, Itching and Swelling   Sulfur Hives, Itching and Swelling   Bupropion Anxiety and Other (See Comments)    headache   Buspirone Other (See Comments)    Abdominal bloating and increased intestinal gas   Latex Hives and Itching   Metformin And Related Diarrhea    Other Reaction(s): Abdominal Pain    Review of Systems: Negative except as noted in the HPI.  Objective: No changes noted in today's physical examination. Vitals:   07/25/22 1411  BP: (!) 146/90   [name]  is a pleasant 67 y.o. female WD, WN in NAD. AAO x 3.  Vascular Examination: CFT immediate b/l LE. Palpable DP/PT pulses b/l LE. Digital hair present b/l. Skin temperature gradient WNL b/l. No pain with calf compression b/l. No edema noted b/l. No cyanosis or clubbing noted b/l LE.  Dermatological Examination: No open wounds b/l LE. No interdigital macerations noted b/l LE. Toenails 1-5 b/l are painful, elongated, discolored, dystrophic with subungual debris. Pain with dorsal palpation of nailplates. No erythema, no edema, no drainage noted.   Porokeratotic lesion(s) left forefoot submet head 2 tender to palpation.  No erythema, no edema, no drainage, no fluctuance.  Scaly patches consistent with psoriasis.  Neurological Examination: Pt has subjective symptoms of neuropathy. Protective sensation intact 5/5 intact bilaterally with 10g monofilament b/l.  Musculoskeletal Examination: Muscle strength 5/5 to all lower extremity muscle groups bilaterally. HAV with bunion deformity noted b/l LE.  Assessment/Plan: 1. Pain due to onychomycosis of toenails of both feet   2. Porokeratosis   3. Controlled type 2 diabetes mellitus without complication, without long-term current use of insulin ([place])     -Consent given for treatment as described below: -Examined patient. -Patient to continue soft, supportive shoe gear daily. -Mycotic toenails 1-5 bilaterally were debrided in length and girth with sterile nail nippers and dremel without incident. -Porokeratotic lesion(s) submet head 2 left foot pared and enucleated with sterile currette without incident. Total number of lesions debrided=1. -Patient/POA to call should there be question/concern in the interim.   Return in about 3 months (  around 10/23/2022).  Marzetta Board, DPM

## 2022-09-06 ENCOUNTER — Ambulatory Visit (INDEPENDENT_AMBULATORY_CARE_PROVIDER_SITE_OTHER): Payer: Medicare Other

## 2022-09-06 DIAGNOSIS — I495 Sick sinus syndrome: Secondary | ICD-10-CM

## 2022-09-12 LAB — CUP PACEART REMOTE DEVICE CHECK
Battery Remaining Longevity: 31 mo
Battery Voltage: 2.94 V
Brady Statistic AP VP Percent: 0.04 %
Brady Statistic AP VS Percent: 99.81 %
Brady Statistic AS VP Percent: 0 %
Brady Statistic AS VS Percent: 0.15 %
Brady Statistic RA Percent Paced: 99.85 %
Brady Statistic RV Percent Paced: 0.04 %
Date Time Interrogation Session: 20240405172637
Implantable Lead Connection Status: 753985
Implantable Lead Connection Status: 753985
Implantable Lead Implant Date: 20160202
Implantable Lead Implant Date: 20160202
Implantable Lead Location: 753859
Implantable Lead Location: 753860
Implantable Lead Model: 5076
Implantable Lead Model: 5076
Implantable Pulse Generator Implant Date: 20160202
Lead Channel Impedance Value: 418 Ohm
Lead Channel Impedance Value: 437 Ohm
Lead Channel Impedance Value: 513 Ohm
Lead Channel Impedance Value: 532 Ohm
Lead Channel Pacing Threshold Amplitude: 0.5 V
Lead Channel Pacing Threshold Amplitude: 1 V
Lead Channel Pacing Threshold Pulse Width: 0.4 ms
Lead Channel Pacing Threshold Pulse Width: 0.4 ms
Lead Channel Sensing Intrinsic Amplitude: 15.625 mV
Lead Channel Sensing Intrinsic Amplitude: 15.625 mV
Lead Channel Sensing Intrinsic Amplitude: 3.75 mV
Lead Channel Sensing Intrinsic Amplitude: 3.75 mV
Lead Channel Setting Pacing Amplitude: 2 V
Lead Channel Setting Pacing Amplitude: 2.5 V
Lead Channel Setting Pacing Pulse Width: 0.4 ms
Lead Channel Setting Sensing Sensitivity: 2.8 mV
Zone Setting Status: 755011
Zone Setting Status: 755011

## 2022-10-18 NOTE — Progress Notes (Signed)
Remote pacemaker transmission.   

## 2022-10-19 NOTE — Progress Notes (Signed)
Office Visit Note  Patient: [name]             Date of Birth: Feb 27, 1956           MRN: [id number]             PCP: Porfirio Oar, PA Referring: Porfirio Oar, PA Visit Date: 11/02/2022 Occupation: @GUAROCC @  Subjective:  Left trochanteric bursitis  History of Present Illness: [name] is a 67 y.o. female with history of fibromyalgia and DDD.  Patient presents today with increased discomfort in the left hip consistent with trochanter bursitis.  She had a left trochanteric bursa injection on 05/03/2022 which provided significant relief but her symptoms have recurred.  Patient states she continues to have generalized myalgias and muscle tenderness due to fibromyalgia.  She states that her discomfort is typically exacerbated by cold temperatures outdoors or with air conditioning.  Patient states she would like a referral [facility] which has been helpful in the past.  She plans on continuing her home exercises as well since she has a treadmill at home.  She has been using topical agents and heating pad for pain relief and remains on Flexeril and Percocet as prescribed.  She is taking Ambien at bedtime for insomnia.   Activities of Daily Living:  Patient reports joint stiffness all day  Patient Reports nocturnal pain.  Difficulty dressing/grooming: Reports Difficulty climbing stairs: Reports Difficulty getting out of chair: Reports Difficulty using hands for taps, buttons, cutlery, and/or writing: Denies  Review of Systems  Constitutional:  Positive for fatigue.  HENT:  Positive for mouth dryness. Negative for mouth sores and nose dryness.   Eyes:  Positive for dryness. Negative for pain and visual disturbance.  Respiratory:  Positive for cough. Negative for hemoptysis, shortness of breath and difficulty breathing.   Cardiovascular:  Negative for chest pain, palpitations, hypertension and swelling in legs/feet.  Gastrointestinal:  Negative for blood  in stool, constipation and diarrhea.  Endocrine: Negative for increased urination.  Genitourinary:  Negative for painful urination and involuntary urination.  Musculoskeletal:  Positive for joint pain, joint pain, joint swelling, myalgias, muscle weakness, morning stiffness, muscle tenderness and myalgias. Negative for gait problem.  Skin:  Positive for rash, hair loss and sensitivity to sunlight. Negative for color change, pallor, nodules/bumps, skin tightness and ulcers.  Allergic/Immunologic: Positive for susceptible to infections.  Neurological:  Positive for dizziness and headaches. Negative for numbness and weakness.  Hematological:  Negative for swollen glands.  Psychiatric/Behavioral:  Positive for depressed mood and sleep disturbance. The patient is nervous/anxious.     PMFS History:  Patient Active Problem List   Diagnosis Date Noted   Chronic prescription benzodiazepine use 10/20/2021   Chronic prescription opiate use 10/20/2021   Vasomotor instability 10/20/2021   Estrogen deficiency 10/20/2021   Prediabetes 09/03/2021   Dyshidrosis (pompholyx) 11/24/2020   Abdominal pain, LLQ 08/10/2020   Arthritis 08/10/2020   Change in bowel habit 08/10/2020   Colon cancer screening 08/10/2020   Diverticulitis of colon 08/10/2020   Flatulence, eructation and gas pain 08/10/2020   Hemorrhage of colon due to diverticulosis 08/10/2020   Nausea 08/10/2020   Vomiting without nausea 08/10/2020   Irritable bowel syndrome with diarrhea 01/21/2020   Laryngopharyngeal reflux (LPR) 05/24/2018   TMJ (sprain of temporomandibular joint), initial encounter 05/24/2018   Aortic atherosclerosis (HCC) 10/18/2017   Acute coronary syndrome (HCC) 02/09/2017   Right knee pain 07/04/2016   Pain in right ankle and joints of  right foot 07/04/2016   Psoriasis 03/10/2015   Right foot pain 11/13/2014   Ethmoid sinusitis 11/13/2014   Abdominal pain, epigastric 11/13/2014   Vitamin D deficiency 11/13/2014    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   Sick sinus syndrome (HCC) 07/08/2014   Chest pain at rest 07/05/2014   Chronic rhinitis 10/22/2013   Fatty liver 10/22/2013   Interstitial cystitis 10/22/2013   Hip pain, right 10/22/2013   NASH (nonalcoholic steatohepatitis) 10/22/2013   Chronic insomnia 09/25/2012   HTN (hypertension) 09/25/2012   Hyperlipidemia 09/25/2012   Hyperlipidemia associated with type 2 diabetes mellitus (HCC) 09/25/2012   Diabetes mellitus type 2, controlled, without complications (HCC) 05/22/2012   GERD 12/04/2008   History of colonic polyps 12/04/2008   CONSTIPATION 10/21/2008   Fibromyalgia 10/21/2008   LACTOSE INTOLERANCE 11/20/2007   Chest pain, precordial 11/20/2007   Lactose intolerance 11/20/2007   Anxiety and depression 08/02/2007   Osteoporosis 08/02/2007   Recurrent major depressive disorder, in remission (HCC) 08/02/2007   Moderate episode of recurrent major depressive disorder (HCC) 08/02/2007   Diverticulosis of large intestine 11/16/2006   EXTERNAL HEMORRHOIDS 05/12/2005    Past Medical History:  Diagnosis Date   Allergy    Anal fissure 02/2012   Anxiety    Arthritis    Asthma    Bursitis    right hip   Complication of anesthesia     woke up during a colonscopy, difficult to wake up post-op sometimes , requires lots of blankets to stay warm post op   Depression    Diabetes mellitus without complication (HCC)    type 2 diet controlled   Fatty liver    sees dr Loreta Ave for   Fibromyalgia    Gallstones 02/15/2015   GERD (gastroesophageal reflux disease)    Hepatitis A infection 1979   Hyperlipidemia    Hypertension    Irritable bowel syndrome (IBS)    Presence of permanent cardiac pacemaker 07/2014   sees dr allred   Psoriasis    left foot   Symptomatic bradycardia    a. s/p MDT MRI compatible dual chamber pacemaker 07/2014 Dr Allred   Tarsal tunnel syndrome of left side     Family History  Problem Relation Age of Onset   Cancer Mother         liver   Diabetes Mother    Hyperlipidemia Mother    Hypertension Mother    Stroke Mother    Cancer Maternal Grandmother        breast   Anesthesia problems Sister        hard to wake up post-op   Heart disease Father    Hyperlipidemia Brother    HIV/AIDS Brother    Cancer Maternal Grandfather        colon   Diabetes Brother    Cancer Brother        lung   Cancer Paternal Grandfather        COLON   Past Surgical History:  Procedure Laterality Date   ABDOMINAL HYSTERECTOMY  09/1994   partial   APPENDECTOMY     BILATERAL SALPINGOOPHORECTOMY Bilateral 04/2011   CARDIAC CATHETERIZATION  10/08/2007   CESAREAN SECTION     x 1   CHOLECYSTECTOMY N/A 02/16/2015   Procedure: LAPAROSCOPIC CHOLECYSTECTOMY WITH INTRAOPERATIVE CHOLANGIOGRAM;  Surgeon: Luretha Murphy, MD;  Location: [facility];  Service: General;  Laterality: N/A;   COLONOSCOPY WITH PROPOFOL N/A 08/19/2016   Procedure: COLONOSCOPY WITH PROPOFOL;  Surgeon: Jeani Hawking, MD;  Location: [facility];  Service: Endoscopy;  Laterality: N/A;   COLONOSCOPY WITH PROPOFOL N/A 10/22/2021   Procedure: COLONOSCOPY WITH PROPOFOL;  Surgeon: Jeani Hawking, MD;  Location: [facility];  Service: Gastroenterology;  Laterality: N/A;   CYSTOSCOPY W/ URETERAL STENT PLACEMENT  08/04/2009   EXAMINATION UNDER ANESTHESIA  02/17/2012   Procedure: EXAM UNDER ANESTHESIA;  Surgeon: Valarie Merino, MD;  Location: [facility];  Service: General;  Laterality: N/A;  Exam under anesthesia with lateral internal sphincterotomy   LAPAROSCOPIC LYSIS INTESTINAL ADHESIONS  08/04/2009   LEFT HEART CATH AND CORONARY ANGIOGRAPHY N/A 02/10/2017   Procedure: LEFT HEART CATH AND CORONARY ANGIOGRAPHY;  Surgeon: Orpah Cobb, MD;  Location: [facility];  Service: Cardiovascular;  Laterality: N/A;   PERMANENT PACEMAKER INSERTION N/A 07/08/2014   MDT MRI compatible dual chamber pacemaker implanted by Dr Johney Frame for symptomatic bradycardia   POLYPECTOMY  10/22/2021    Procedure: POLYPECTOMY;  Surgeon: Jeani Hawking, MD;  Location: [facility];  Service: Gastroenterology;;   SHOULDER ARTHROSCOPY W/ ROTATOR CUFF REPAIR Left 1999   SMALL INTESTINE SURGERY     SPHINCTEROTOMY  02/17/2012   Procedure: SPHINCTEROTOMY;  Surgeon: Valarie Merino, MD;  Location: [facility];  Service: General;  Laterality: N/A;   Social History   Social History Narrative   Lives alone.  Daughter and granddaughter live in [place], [place]. Pt exercise sometimes.      Immunization History  Administered Date(s) Administered   Influenza,inj,Quad PF,6+ Mos 03/10/2015, 04/12/2016, 03/24/2017   Influenza-Unspecified 03/17/2014   PFIZER(Purple Top)SARS-COV-2 Vaccination 10/02/2019, 10/23/2019, 05/12/2020   Pfizer Covid-19 Vaccine Bivalent Booster 56yrs & up 05/23/2021   Pneumococcal Polysaccharide-23 03/10/2015   Td 02/16/2012   Tdap 02/16/2012, 11/03/2021     Objective: Vital Signs: BP (!) 147/91 (BP Location: Left Arm, Patient Position: Sitting, Cuff Size: Normal)   Pulse 62   Resp 16   Ht 5\' 7"  (1.702 m)   Wt 170 lb (77.1 kg)   BMI 26.63 kg/m    Physical Exam Vitals and nursing note reviewed.  Constitutional:      Appearance: She is well-developed.  HENT:     Head: Normocephalic and atraumatic.  Eyes:     Conjunctiva/sclera: Conjunctivae normal.  Cardiovascular:     Rate and Rhythm: Normal rate and regular rhythm.     Heart sounds: Normal heart sounds.  Pulmonary:     Effort: Pulmonary effort is normal.     Breath sounds: Normal breath sounds.  Abdominal:     General: Bowel sounds are normal.     Palpations: Abdomen is soft.  Musculoskeletal:     Cervical back: Normal range of motion.  Lymphadenopathy:     Cervical: No cervical adenopathy.  Skin:    General: Skin is warm and dry.     Capillary Refill: Capillary refill takes less than 2 seconds.  Neurological:     Mental Status: She is alert and oriented to person, place, and time.   Psychiatric:        Behavior: Behavior normal.      Musculoskeletal Exam: Generalized hyperalgesia and positive tender points.  C-spine, thoracic spine, and lumbar spine good ROM.  Shoulder joints, elbow joints, wrist joints, MCPs, PIPs, and DIPs good ROM with no synovitis.  Complete fist formation bilaterally.  Hip joints, knee joints, and ankle joints have good ROM with no tenderness or joint swelling. Tenderness over the left trochanteric bursa.   CDAI Exam: CDAI Score: -- Patient Global: --; Provider Global: -- Swollen: --;  Tender: -- Joint Exam 11/02/2022   No joint exam has been documented for this visit   There is currently no information documented on the homunculus. Go to the Rheumatology activity and complete the homunculus joint exam.  Investigation: No additional findings.  Imaging: No results found.  Recent Labs: Lab Results  Component Value Date   WBC 8.4 07/10/2021   HGB 13.7 07/10/2021   PLT 276 07/10/2021   NA 136 07/10/2021   K 3.5 07/10/2021   CL 103 07/10/2021   CO2 24 07/10/2021   GLUCOSE 135 (H) 07/10/2021   BUN 6 (L) 07/10/2021   CREATININE 0.72 07/10/2021   BILITOT 0.6 07/10/2021   ALKPHOS 44 07/10/2021   AST 28 07/10/2021   ALT 37 07/10/2021   PROT 7.5 07/10/2021   ALBUMIN 4.0 07/10/2021   CALCIUM 8.9 07/10/2021   GFRAA 110 10/10/2019    Speciality Comments: No specialty comments available.  Procedures:  Large Joint Inj: L greater trochanter on 11/02/2022 11:43 AM Indications: pain Details: 27 G 1.5 in needle, lateral approach  Arthrogram: No  Medications: 1.5 mL lidocaine 1 %; 40 mg triamcinolone acetonide 40 MG/ML Aspirate: 0 mL Outcome: tolerated well, no immediate complications Procedure, treatment alternatives, risks and benefits explained, specific risks discussed. Consent was given by the patient. Immediately prior to procedure a time out was called to verify the correct patient, procedure, equipment, support staff and  site/side marked as required. Patient was prepped and draped in the usual sterile fashion.     Allergies: Codeine, Propoxyphene n-acetaminophen, Sulfonamide derivatives, Alka-seltzer [aspirin effervescent], Amlodipine, Amoxicillin-pot clavulanate, Norvasc [amlodipine besylate], Red dye, Singulair [montelukast sodium], Sulfa antibiotics, Sulfur, Bupropion, Buspirone, Latex, and Metformin and related   Assessment / Plan:     Visit Diagnoses: Fibromyalgia: She has generalized hyperalgesia and positive tender points on examination.  Patient continues to experience generalized myalgias, muscle tenderness, and stiffness.  Her symptoms are typically exacerbated by cooler weather temperatures and exposure to cold AC.  Patient presents today with discomfort in the left hip consistent with trochanteric bursitis.  After informed consent the left trochanteric bursa was injected with cortisone.  She tolerated procedure well.  Procedure note was completed above.  Aftercare was discussed.  Referral to physical therapy will also be placed to help alleviate some of her pain from fibromyalgia.  She plans on continuing home exercises.  She was given a handout of exercises to perform.  Discussed the importance of regular exercise and good sleep hygiene.  She will follow-up in the office in 6 months or sooner if needed.  Trapezius muscle spasm: She has ongoing numbness bilaterally.  Chronic insomnia: She takes Ambien 10 mg at bedtime for insomnia.  Other fatigue: Chronic and secondary to insomnia.  Discussed the importance of regular exercise.  Trochanteric bursitis of both hips: Patient presents today with discomfort on the lateral aspect of the left hip consistent with trochanteric bursitis.  Different treatment options were discussed today.  After informed consent the left trochanteric bursa was injected with cortisone.  She tolerated procedure well.  Procedure note was completed above.  Aftercare was discussed.  A  referral to PT placed today.    Chronic pain of right knee: She has intermittent discomfort in the right knee joint.    DDD (degenerative disc disease), lumbar: Limited mobility at times.  Referral to physical therapy will be placed today.   Psoriasis: Plantar aspect of the left foot.  Osteopenia of multiple sites - DEXA 07/04/2017 AP spine BMD 0.920 with  T score -2.1.  She had an updated DEXA ordered by her PCP--we do not have these records to review.  Vitamin D deficiency: She is taking vitamin D 2000 units daily.  Other medical conditions are listed as follows:  Diverticulosis of large intestine without hemorrhage  Hair loss  History of IBS  History of asthma  Essential hypertension: BP was 147/91 today in the office.  Patient was advised to monitor blood pressure closely upon the cortisone injection today.  History of hypercholesterolemia  Diverticulitis of colon  Pacemaker  History of gastroesophageal reflux (GERD)  Sick sinus syndrome (HCC)  Anxiety and depression  Orders: Orders Placed This Encounter  Procedures   Large Joint Inj   No orders of the defined types were placed in this encounter.    Follow-Up Instructions: Return in about 6 months (around 05/05/2023) for Fibromyalgia, DDD.   Gearldine Bienenstock, PA-C  Note - This record has been created using Dragon software.  Chart creation errors have been sought, but may not always  have been located. Such creation errors do not reflect on  the standard of medical care.

## 2022-10-26 ENCOUNTER — Ambulatory Visit (INDEPENDENT_AMBULATORY_CARE_PROVIDER_SITE_OTHER): Payer: Medicare Other | Admitting: Podiatry

## 2022-10-26 ENCOUNTER — Encounter: Payer: Self-pay | Admitting: Podiatry

## 2022-10-26 DIAGNOSIS — M79674 Pain in right toe(s): Secondary | ICD-10-CM | POA: Diagnosis not present

## 2022-10-26 DIAGNOSIS — B351 Tinea unguium: Secondary | ICD-10-CM

## 2022-10-26 DIAGNOSIS — E119 Type 2 diabetes mellitus without complications: Secondary | ICD-10-CM

## 2022-10-26 DIAGNOSIS — Q828 Other specified congenital malformations of skin: Secondary | ICD-10-CM | POA: Diagnosis not present

## 2022-10-26 DIAGNOSIS — M79675 Pain in left toe(s): Secondary | ICD-10-CM | POA: Diagnosis not present

## 2022-10-30 NOTE — Progress Notes (Signed)
Subjective:  Patient ID: [name], female    DOB: 07/20/1955,  MRN: [id number]  [name] presents to clinic today for preventative diabetic foot care and painful porokeratotic lesion(s) left lower extremity and painful mycotic toenails that limit ambulation. Painful toenails interfere with ambulation. Aggravating factors include wearing enclosed shoe gear. Pain is relieved with periodic professional debridement. Painful porokeratotic lesions are aggravated when weightbearing with and without shoegear. Pain is relieved with periodic professional debridement.  Chief Complaint  Patient presents with   Nail Problem    DFC BS-did not check today A1C-6.5 PCP-Jeff[facility] PCP VST-08/2022   New problem(s): None.   PCP is Porfirio Oar, PA.  Allergies  Allergen Reactions   Codeine Other (See Comments)    HALLUCINATIONS    Propoxyphene N-Acetaminophen Other (See Comments)    HALLUCINATIONS   Sulfonamide Derivatives Hives, Itching and Swelling   Alka-Seltzer [Aspirin Effervescent] Other (See Comments)    "like she's fading away", jittery    Amlodipine     In high doses causes stomach issues    Amoxicillin-Pot Clavulanate     In high doses causes stomach issues    Norvasc [Amlodipine Besylate] Other (See Comments)    "passed out"   Red Dye Other (See Comments)    "burning and tingling sensation"    Singulair [Montelukast Sodium] Other (See Comments)    Increased depression, sadness   Sulfa Antibiotics Hives, Itching and Swelling   Sulfur Hives, Itching and Swelling   Bupropion Anxiety and Other (See Comments)    headache   Buspirone Other (See Comments)    Abdominal bloating and increased intestinal gas   Latex Hives and Itching   Metformin And Related Diarrhea    Other Reaction(s): Abdominal Pain    Review of Systems: Negative except as noted in the HPI.  Objective: No changes noted in today's physical examination. There were no vitals filed for this visit. [name]  is a pleasant 67 y.o. female WD, WN in NAD. AAO x 3.  Vascular Examination: CFT immediate b/l LE. Palpable DP/PT pulses b/l LE. Digital hair present b/l. Skin temperature gradient WNL b/l. No pain with calf compression b/l. No edema noted b/l. No cyanosis or clubbing noted b/l LE.  Dermatological Examination: No open wounds b/l LE. No interdigital macerations noted b/l LE. Toenails 1-5 b/l are painful, elongated, discolored, dystrophic with subungual debris. Pain with dorsal palpation of nailplates. No erythema, no edema, no drainage noted.   Porokeratotic lesion(s) left forefoot submet head 2 tender to palpation.  No erythema, no edema, no drainage, no fluctuance.  Scaly patches consistent with psoriasis.  Neurological Examination: Pt has subjective symptoms of neuropathy. Protective sensation intact 5/5 intact bilaterally with 10g monofilament b/l.  Musculoskeletal Examination: Muscle strength 5/5 to all lower extremity muscle groups bilaterally. HAV with bunion deformity noted b/l LE.  Assessment/Plan: 1. Pain due to onychomycosis of toenails of both feet   2. Porokeratosis   3. Controlled type 2 diabetes mellitus without complication, without long-term current use of insulin (HCC)     -Patient was evaluated and treated. All patient's and/or POA's questions/concerns answered on today's visit. -Toenails 1-5 b/l were debrided in length and girth with sterile nail nippers and dremel without iatrogenic bleeding.  -Porokeratotic lesion(s) submet head 2 left foot pared and enucleated with sterile currette without incident. Total number of lesions debrided=1. -Patient/POA to call should there be question/concern in the interim.   Return in about 3 months (around 01/26/2023).  Lise Auer  Eloy End, DPM

## 2022-11-02 ENCOUNTER — Encounter: Payer: Self-pay | Admitting: Physician Assistant

## 2022-11-02 ENCOUNTER — Ambulatory Visit: Payer: Medicare Other | Attending: Physician Assistant | Admitting: Physician Assistant

## 2022-11-02 VITALS — BP 147/91 | HR 62 | Resp 16 | Ht 67.0 in | Wt 170.0 lb

## 2022-11-02 DIAGNOSIS — F5104 Psychophysiologic insomnia: Secondary | ICD-10-CM | POA: Diagnosis not present

## 2022-11-02 DIAGNOSIS — M62838 Other muscle spasm: Secondary | ICD-10-CM | POA: Diagnosis not present

## 2022-11-02 DIAGNOSIS — Z8639 Personal history of other endocrine, nutritional and metabolic disease: Secondary | ICD-10-CM

## 2022-11-02 DIAGNOSIS — M797 Fibromyalgia: Secondary | ICD-10-CM

## 2022-11-02 DIAGNOSIS — L409 Psoriasis, unspecified: Secondary | ICD-10-CM

## 2022-11-02 DIAGNOSIS — I10 Essential (primary) hypertension: Secondary | ICD-10-CM

## 2022-11-02 DIAGNOSIS — M7062 Trochanteric bursitis, left hip: Secondary | ICD-10-CM | POA: Diagnosis not present

## 2022-11-02 DIAGNOSIS — I495 Sick sinus syndrome: Secondary | ICD-10-CM

## 2022-11-02 DIAGNOSIS — G8929 Other chronic pain: Secondary | ICD-10-CM

## 2022-11-02 DIAGNOSIS — F419 Anxiety disorder, unspecified: Secondary | ICD-10-CM

## 2022-11-02 DIAGNOSIS — R5383 Other fatigue: Secondary | ICD-10-CM

## 2022-11-02 DIAGNOSIS — E559 Vitamin D deficiency, unspecified: Secondary | ICD-10-CM

## 2022-11-02 DIAGNOSIS — F32A Depression, unspecified: Secondary | ICD-10-CM

## 2022-11-02 DIAGNOSIS — M5136 Other intervertebral disc degeneration, lumbar region: Secondary | ICD-10-CM

## 2022-11-02 DIAGNOSIS — K5732 Diverticulitis of large intestine without perforation or abscess without bleeding: Secondary | ICD-10-CM

## 2022-11-02 DIAGNOSIS — Z8719 Personal history of other diseases of the digestive system: Secondary | ICD-10-CM

## 2022-11-02 DIAGNOSIS — M25561 Pain in right knee: Secondary | ICD-10-CM

## 2022-11-02 DIAGNOSIS — L659 Nonscarring hair loss, unspecified: Secondary | ICD-10-CM

## 2022-11-02 DIAGNOSIS — K5730 Diverticulosis of large intestine without perforation or abscess without bleeding: Secondary | ICD-10-CM

## 2022-11-02 DIAGNOSIS — Z950 Presence of cardiac pacemaker: Secondary | ICD-10-CM

## 2022-11-02 DIAGNOSIS — Z8709 Personal history of other diseases of the respiratory system: Secondary | ICD-10-CM

## 2022-11-02 DIAGNOSIS — M7061 Trochanteric bursitis, right hip: Secondary | ICD-10-CM

## 2022-11-02 DIAGNOSIS — M8589 Other specified disorders of bone density and structure, multiple sites: Secondary | ICD-10-CM

## 2022-11-02 MED ORDER — TRIAMCINOLONE ACETONIDE 40 MG/ML IJ SUSP
40.0000 mg | INTRAMUSCULAR | Status: AC | PRN
  Administered 2022-11-02: 40 mg via INTRA_ARTICULAR

## 2022-11-02 MED ORDER — LIDOCAINE HCL 1 % IJ SOLN
1.5000 mL | INTRAMUSCULAR | Status: AC | PRN
  Administered 2022-11-02: 1.5 mL

## 2022-11-02 NOTE — Patient Instructions (Signed)
Hip Bursitis Rehab Ask your health care provider which exercises are safe for you. Do exercises exactly as told by your health care provider and adjust them as directed. It is normal to feel mild stretching, pulling, tightness, or discomfort as you do these exercises. Stop right away if you feel sudden pain or your pain gets worse. Do not begin these exercises until told by your health care provider. Stretching exercise This exercise warms up your muscles and joints and improves the movement and flexibility of your hip. This exercise also helps to relieve pain and stiffness. Iliotibial band stretch An iliotibial band is a strong band of muscle tissue that runs from the outer side of your hip to the outer side of your thigh and knee. Lie on your side with your left / right leg in the top position. Bend your left / right knee and grab your ankle. Stretch out your bottom arm to help you balance. Slowly bring your knee back so your thigh is slightly behind your body. Slowly lower your knee toward the floor until you feel a gentle stretch on the outside of your left / right thigh. If you do not feel a stretch and your knee will not lower more toward the floor, place the heel of your other foot on top of your knee and pull your knee down toward the floor with your foot. Hold this position for __________ seconds. Slowly return to the starting position. Repeat __________ times. Complete this exercise __________ times a day. Strengthening exercises These exercises build strength and endurance in your hip and pelvis. Endurance is the ability to use your muscles for a long time, even after they get tired. Bridge This exercise strengthens the muscles that move your thigh backward (hip extensors). Lie on your back on a firm surface with your knees bent and your feet flat on the floor. Tighten your buttocks muscles and lift your buttocks off the floor until your trunk is level with your thighs. Do not arch your  back. You should feel the muscles working in your buttocks and the back of your thighs. If you do not feel these muscles, slide your feet 1-2 inches (2.5-5 cm) farther away from your buttocks. If this exercise is too easy, try doing it with your arms crossed over your chest. Hold this position for __________ seconds. Slowly lower your hips to the starting position. Let your muscles relax completely after each repetition. Repeat __________ times. Complete this exercise __________ times a day. Squats This exercise strengthens the muscles in front of your thigh and knee (quadriceps). Stand in front of a table, with your feet and knees pointing straight ahead. You may rest your hands on the table for balance but not for support. Slowly bend your knees and lower your hips like you are going to sit in a chair. Keep your weight over your heels, not over your toes. Keep your lower legs upright so they are parallel with the table legs. Do not let your hips go lower than your knees. Do not bend lower than told by your health care provider. If your hip pain increases, do not bend as low. Hold the squat position for __________ seconds. Slowly push with your legs to return to standing. Do not use your hands to pull yourself to standing. Repeat __________ times. Complete this exercise __________ times a day. Hip hike  Stand sideways on a bottom step. Stand on your left / right leg with your other foot unsupported next to  the step. You can hold on to the railing or wall for balance if needed. Keep your knees straight and your torso square. Then lift your left / right hip up toward the ceiling. Hold this position for __________ seconds. Slowly let your left / right hip lower toward the floor, past the starting position. Your foot should get closer to the floor. Do not lean or bend your knees. Repeat __________ times. Complete this exercise __________ times a day. Single leg stand This exercise increases  your balance. Without shoes, stand near a railing or in a doorway. You may hold on to the railing or door frame as needed for balance. Squeeze your left / right buttock muscles, then lift up your other foot. Do not let your left / right hip push out to the side. It is helpful to stand in front of a mirror for this exercise so you can watch your hip. Hold this position for __________ seconds. Repeat __________ times. Complete this exercise __________ times a day. This information is not intended to replace advice given to you by your health care provider. Make sure you discuss any questions you have with your health care provider. Document Revised: 05/05/2021 Document Reviewed: 05/05/2021 Elsevier Patient Education  2024 [employer].

## 2022-11-02 NOTE — Addendum Note (Signed)
Addended by: Ellen Henri on: 11/02/2022 12:24 PM   Modules accepted: Orders

## 2022-12-06 ENCOUNTER — Ambulatory Visit (INDEPENDENT_AMBULATORY_CARE_PROVIDER_SITE_OTHER): Payer: Medicare Other

## 2022-12-06 DIAGNOSIS — I495 Sick sinus syndrome: Secondary | ICD-10-CM | POA: Diagnosis not present

## 2022-12-08 LAB — CUP PACEART REMOTE DEVICE CHECK
Battery Remaining Longevity: 28 mo
Battery Voltage: 2.93 V
Brady Statistic AP VP Percent: 0.04 %
Brady Statistic AP VS Percent: 99.89 %
Brady Statistic AS VP Percent: 0 %
Brady Statistic AS VS Percent: 0.08 %
Brady Statistic RA Percent Paced: 99.92 %
Brady Statistic RV Percent Paced: 0.04 %
Date Time Interrogation Session: 20240703164504
Implantable Lead Connection Status: 753985
Implantable Lead Connection Status: 753985
Implantable Lead Implant Date: 20160202
Implantable Lead Implant Date: 20160202
Implantable Lead Location: 753859
Implantable Lead Location: 753860
Implantable Lead Model: 5076
Implantable Lead Model: 5076
Implantable Pulse Generator Implant Date: 20160202
Lead Channel Impedance Value: 418 Ohm
Lead Channel Impedance Value: 437 Ohm
Lead Channel Impedance Value: 475 Ohm
Lead Channel Impedance Value: 551 Ohm
Lead Channel Pacing Threshold Amplitude: 0.5 V
Lead Channel Pacing Threshold Amplitude: 1 V
Lead Channel Pacing Threshold Pulse Width: 0.4 ms
Lead Channel Pacing Threshold Pulse Width: 0.4 ms
Lead Channel Sensing Intrinsic Amplitude: 18 mV
Lead Channel Sensing Intrinsic Amplitude: 18 mV
Lead Channel Sensing Intrinsic Amplitude: 4.625 mV
Lead Channel Sensing Intrinsic Amplitude: 4.625 mV
Lead Channel Setting Pacing Amplitude: 2 V
Lead Channel Setting Pacing Amplitude: 2.5 V
Lead Channel Setting Pacing Pulse Width: 0.4 ms
Lead Channel Setting Sensing Sensitivity: 2.8 mV
Zone Setting Status: 755011
Zone Setting Status: 755011

## 2022-12-30 NOTE — Progress Notes (Signed)
Remote pacemaker transmission.   

## 2023-01-31 ENCOUNTER — Ambulatory Visit (INDEPENDENT_AMBULATORY_CARE_PROVIDER_SITE_OTHER): Payer: Medicare Other | Admitting: Podiatry

## 2023-01-31 DIAGNOSIS — M79674 Pain in right toe(s): Secondary | ICD-10-CM

## 2023-01-31 DIAGNOSIS — M79675 Pain in left toe(s): Secondary | ICD-10-CM

## 2023-01-31 DIAGNOSIS — B351 Tinea unguium: Secondary | ICD-10-CM

## 2023-01-31 NOTE — Progress Notes (Unsigned)
Subjective:  Patient ID: [name], female    DOB: March 12, 1956,  MRN: [id number]   [name] presents to clinic today for:  Chief Complaint  Patient presents with   Nail Problem    [name]   Patient notes nails are thick, discolored, elongated and painful in shoegear when trying to ambulate.  Patient's last HbA1c level on 11/17/2022 was 6.1.  PCP is Alicia Oar, PA.  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 s/p MDT MRI compatible dual chamber pacemaker 07/2014 [name]   Tarsal tunnel syndrome of left side     Allergies  Allergen Reactions   Codeine Other (See Comments)    HALLUCINATIONS    Propoxyphene N-Acetaminophen Other (See Comments)    HALLUCINATIONS   Sulfonamide Derivatives Hives, Itching and Swelling   Alka-Seltzer [Aspirin Effervescent] Other (See Comments)    "like she's fading away", jittery    Amlodipine     In high doses causes stomach issues    Amoxicillin-Pot Clavulanate     In high doses causes stomach issues    Norvasc [Amlodipine Besylate] Other (See Comments)    "passed out"   Red Dye #40 (Allura Red) Other (See Comments)    "burning and tingling sensation"    Singulair [Montelukast Sodium] Other (See Comments)    Increased depression, sadness   Sulfa Antibiotics Hives,  Itching and Swelling   Sulfur Hives, Itching and Swelling   Bupropion Anxiety and Other (See Comments)    headache   Buspirone Other (See Comments)    Abdominal bloating and increased intestinal gas   Latex Hives and Itching   Metformin And Related Diarrhea    Other Reaction(s): Abdominal Pain    Review of Systems: Negative except as noted in the HPI.  Objective:  There were no vitals filed for this visit.  [name] is a pleasant 67 y.o. female in NAD. AAO x 3.  Vascular Examination: Capillary refill time is 3-5 seconds to toes bilateral. Palpable pedal pulses b/l LE. Digital hair present b/l.  Skin temperature gradient WNL b/l. No varicosities b/l. No cyanosis noted b/l.   Dermatological Examination: Pedal skin with normal turgor, texture and tone b/l. No open wounds. No interdigital macerations b/l. Toenails x10 are 3mm thick, discolored, dystrophic with subungual debris. There is pain with compression of the nail plates.  They are elongated x10  Assessment/Plan: 1. Pain due to onychomycosis of toenails of both feet    The mycotic toenails were sharply debrided x10 with sterile nail nippers and a power debriding burr to decrease bulk/thickness and length.  Return in about 3 months (around 05/03/2023) for [facility].   [name], DPM, FACFAS [facility]     [address] [place], [place] [address]                Office [phone]  Fax [phone]

## 2023-02-10 ENCOUNTER — Telehealth: Payer: Self-pay

## 2023-02-10 NOTE — Telephone Encounter (Signed)
Patient called requesting an injection appointment for today with Dr. Corliss Skains. I advised patient that Dr. Corliss Skains and Ladona Ridgel are both out of the office today and patient insisted she needs one today. I advised patient to go to ortho urgent care or PCP.

## 2023-03-07 ENCOUNTER — Ambulatory Visit (INDEPENDENT_AMBULATORY_CARE_PROVIDER_SITE_OTHER): Payer: Medicare Other

## 2023-03-07 DIAGNOSIS — I495 Sick sinus syndrome: Secondary | ICD-10-CM | POA: Diagnosis not present

## 2023-03-08 LAB — CUP PACEART REMOTE DEVICE CHECK
Battery Remaining Longevity: 25 mo
Battery Voltage: 2.92 V
Brady Statistic AP VP Percent: 0.04 %
Brady Statistic AP VS Percent: 99.81 %
Brady Statistic AS VP Percent: 0 %
Brady Statistic AS VS Percent: 0.16 %
Brady Statistic RA Percent Paced: 99.84 %
Brady Statistic RV Percent Paced: 0.04 %
Date Time Interrogation Session: 20241002125017
Implantable Lead Connection Status: 753985
Implantable Lead Connection Status: 753985
Implantable Lead Implant Date: 20160202
Implantable Lead Implant Date: 20160202
Implantable Lead Location: 753859
Implantable Lead Location: 753860
Implantable Lead Model: 5076
Implantable Lead Model: 5076
Implantable Pulse Generator Implant Date: 20160202
Lead Channel Impedance Value: 418 Ohm
Lead Channel Impedance Value: 437 Ohm
Lead Channel Impedance Value: 494 Ohm
Lead Channel Impedance Value: 532 Ohm
Lead Channel Pacing Threshold Amplitude: 0.375 V
Lead Channel Pacing Threshold Amplitude: 0.875 V
Lead Channel Pacing Threshold Pulse Width: 0.4 ms
Lead Channel Pacing Threshold Pulse Width: 0.4 ms
Lead Channel Sensing Intrinsic Amplitude: 17.375 mV
Lead Channel Sensing Intrinsic Amplitude: 17.375 mV
Lead Channel Sensing Intrinsic Amplitude: 4.375 mV
Lead Channel Sensing Intrinsic Amplitude: 4.375 mV
Lead Channel Setting Pacing Amplitude: 2 V
Lead Channel Setting Pacing Amplitude: 2.5 V
Lead Channel Setting Pacing Pulse Width: 0.4 ms
Lead Channel Setting Sensing Sensitivity: 2.8 mV
Zone Setting Status: 755011
Zone Setting Status: 755011

## 2023-03-22 NOTE — Progress Notes (Signed)
Remote pacemaker transmission.   

## 2023-05-02 ENCOUNTER — Encounter: Payer: Self-pay | Admitting: Podiatry

## 2023-05-02 ENCOUNTER — Ambulatory Visit (INDEPENDENT_AMBULATORY_CARE_PROVIDER_SITE_OTHER): Payer: Medicare Other | Admitting: Podiatry

## 2023-05-02 DIAGNOSIS — M2012 Hallux valgus (acquired), left foot: Secondary | ICD-10-CM

## 2023-05-02 DIAGNOSIS — M2011 Hallux valgus (acquired), right foot: Secondary | ICD-10-CM | POA: Diagnosis not present

## 2023-05-02 DIAGNOSIS — M79674 Pain in right toe(s): Secondary | ICD-10-CM

## 2023-05-02 DIAGNOSIS — M79675 Pain in left toe(s): Secondary | ICD-10-CM

## 2023-05-02 DIAGNOSIS — B351 Tinea unguium: Secondary | ICD-10-CM

## 2023-05-02 DIAGNOSIS — Q828 Other specified congenital malformations of skin: Secondary | ICD-10-CM | POA: Diagnosis not present

## 2023-05-02 DIAGNOSIS — E119 Type 2 diabetes mellitus without complications: Secondary | ICD-10-CM | POA: Diagnosis not present

## 2023-05-06 ENCOUNTER — Encounter: Payer: Self-pay | Admitting: Podiatry

## 2023-05-06 NOTE — Progress Notes (Signed)
ANNUAL DIABETIC FOOT EXAM  Subjective: [name] presents today for annual diabetic foot exam.  Chief Complaint  Patient presents with   Diabetes    PATIENT STATES SINCE HER LAST VISIT HER BILATERAL FEET HAVE BEEN GOOD OTHER THEN THEN DRY SKIN ON HER FEET, PATIENT SEEN PCP WAS IN AUGUST OF THIS YEAR, PATIENT A1C WAS 6.5     Patient confirms h/o diabetes.  Patient denies any h/o foot wounds.  Patient has been diagnosed with neuropathy.  Porfirio Oar, PA is patient's PCP.  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 s/p MDT MRI compatible dual chamber pacemaker 07/2014 Dr Allred   Tarsal tunnel syndrome of left side    Patient Active Problem List   Diagnosis Date Noted   Chronic prescription benzodiazepine use 10/20/2021   Chronic prescription opiate use 10/20/2021   Vasomotor instability 10/20/2021   Estrogen deficiency 10/20/2021   Prediabetes 09/03/2021   Dyshidrosis (pompholyx) 11/24/2020   Abdominal pain, LLQ 08/10/2020   Arthritis 08/10/2020   Change in bowel habit 08/10/2020   Colon cancer screening 08/10/2020   Diverticulitis of colon 08/10/2020   Flatulence, eructation and gas pain 08/10/2020   Hemorrhage of colon due to diverticulosis 08/10/2020   Nausea 08/10/2020   Vomiting without nausea  08/10/2020   Irritable bowel syndrome with diarrhea 01/21/2020   Laryngopharyngeal reflux (LPR) 05/24/2018   TMJ (sprain of temporomandibular joint), initial encounter 05/24/2018   Aortic atherosclerosis (HCC) 10/18/2017   Acute coronary syndrome (HCC) 02/09/2017   Right knee pain 07/04/2016   Pain in right ankle and joints of right foot 07/04/2016   Psoriasis 03/10/2015   Right foot pain 11/13/2014   Ethmoid sinusitis 11/13/2014   Abdominal pain, epigastric 11/13/2014   Vitamin D deficiency 11/13/2014   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   Sick sinus syndrome (HCC) 07/08/2014   Chest pain at rest 07/05/2014   Chronic rhinitis 10/22/2013   Fatty liver 10/22/2013   Interstitial cystitis 10/22/2013   Hip pain, right 10/22/2013   NASH (nonalcoholic steatohepatitis) 10/22/2013   Chronic insomnia 09/25/2012   HTN (hypertension) 09/25/2012   Hyperlipidemia 09/25/2012   Hyperlipidemia associated with type 2 diabetes mellitus (HCC) 09/25/2012   Diabetes mellitus type 2, controlled, without complications (HCC) 05/22/2012   GERD 12/04/2008   History of colonic polyps 12/04/2008   CONSTIPATION 10/21/2008   Fibromyalgia 10/21/2008   LACTOSE INTOLERANCE 11/20/2007   Chest pain, precordial 11/20/2007   Lactose intolerance 11/20/2007   Anxiety and depression 08/02/2007   Osteoporosis 08/02/2007   Recurrent major depressive disorder, in remission (HCC) 08/02/2007   Moderate episode of recurrent major depressive disorder (  HCC) 08/02/2007   Diverticulosis of large intestine 11/16/2006   EXTERNAL HEMORRHOIDS 05/12/2005   Past Surgical History:  Procedure Laterality Date   ABDOMINAL HYSTERECTOMY  09/1994   partial   APPENDECTOMY     BILATERAL SALPINGOOPHORECTOMY Bilateral 04/2011   CARDIAC CATHETERIZATION  10/08/2007   CESAREAN SECTION     x 1   CHOLECYSTECTOMY N/A 02/16/2015   Procedure: LAPAROSCOPIC CHOLECYSTECTOMY WITH INTRAOPERATIVE CHOLANGIOGRAM;  Surgeon: Luretha Murphy, MD;  Location: [facility];  Service: General;  Laterality: N/A;   COLONOSCOPY WITH PROPOFOL N/A 08/19/2016   Procedure: COLONOSCOPY WITH PROPOFOL;  Surgeon: Jeani Hawking, MD;  Location: [facility];  Service: Endoscopy;  Laterality: N/A;   COLONOSCOPY WITH PROPOFOL N/A 10/22/2021   Procedure: COLONOSCOPY WITH PROPOFOL;  Surgeon: Jeani Hawking, MD;  Location: [facility];  Service: Gastroenterology;  Laterality: N/A;   CYSTOSCOPY W/ URETERAL STENT PLACEMENT  08/04/2009   EXAMINATION UNDER ANESTHESIA  02/17/2012   Procedure: EXAM UNDER ANESTHESIA;  Surgeon: Valarie Merino, MD;  Location: [facility];  Service: General;  Laterality: N/A;  Exam under anesthesia with lateral internal sphincterotomy   LAPAROSCOPIC LYSIS INTESTINAL ADHESIONS  08/04/2009   LEFT HEART CATH AND CORONARY ANGIOGRAPHY N/A 02/10/2017   Procedure: LEFT HEART CATH AND CORONARY ANGIOGRAPHY;  Surgeon: Orpah Cobb, MD;  Location: [facility];  Service: Cardiovascular;  Laterality: N/A;   PERMANENT PACEMAKER INSERTION N/A 07/08/2014   MDT MRI compatible dual chamber pacemaker implanted by Dr Johney Frame for symptomatic bradycardia   POLYPECTOMY  10/22/2021   Procedure: POLYPECTOMY;  Surgeon: Jeani Hawking, MD;  Location: [facility];  Service: Gastroenterology;;   SHOULDER ARTHROSCOPY W/ ROTATOR CUFF REPAIR Left 1999   SMALL INTESTINE SURGERY     SPHINCTEROTOMY  02/17/2012   Procedure: SPHINCTEROTOMY;  Surgeon: Valarie Merino, MD;  Location: [facility];  Service: General;  Laterality: N/A;   Current Outpatient Medications on File Prior to Visit  Medication Sig Dispense Refill   ACCU-CHEK GUIDE test strip      albuterol (VENTOLIN HFA) 108 (90 Base) MCG/ACT inhaler Inhale 2 puffs into the lungs every 4 (four) hours as needed for wheezing.     ALPRAZolam (XANAX) 0.5 MG tablet Take 0.5 mg by mouth at bedtime as needed for sleep.  0   aspirin 81 MG chewable tablet Chew 81 mg by mouth once a week.     atorvastatin (LIPITOR) 10  MG tablet Take 1 tablet by mouth daily.     azelastine (ASTELIN) 0.1 % nasal spray Place 2 sprays into both nostrils 2 (two) times daily. Use in each nostril as directed (Patient taking differently: Place 2 sprays into both nostrils 2 (two) times daily as needed for allergies. Use in each nostril as directed) 30 mL prn   Azilsartan Medoxomil (EDARBI) 40 MG TABS Take 40 mg by mouth daily.      Blood Glucose Monitoring Suppl (ACCU-CHEK GUIDE) w/Device KIT USE DAILY AS DIRECTED 1 kit 0   cetirizine (ZYRTEC) 10 MG tablet Take 1 tablet by mouth daily.     Cholecalciferol 50 MCG (2000 UT) CAPS Take 1 tablet by mouth daily.     ciprofloxacin (CIPRO) 500 MG tablet Take 1 tablet (500 mg total) by mouth every 12 (twelve) hours. 14 tablet 0   CLENPIQ 10-3.5-12 MG-GM -GM/160ML SOLN      clobetasol ointment (TEMOVATE) 0.05 % Apply 1 application. topically 2 (two) times daily.     cyclobenzaprine (FLEXERIL) 10 MG tablet Take 10 mg  by mouth 3 (three) times daily as needed for muscle spasms.      Dextromethorphan HBr (SCOT-TUSSIN DIABETES) 10 MG/5ML LIQD Take 10 mg by mouth daily as needed (cough).     diazepam (VALIUM) 5 MG tablet Take by mouth.     dicyclomine (BENTYL) 10 MG capsule Take 1 capsule 3 times a day by oral route.     diphenoxylate-atropine (LOMOTIL) 2.5-0.025 MG tablet Take 2 tablets as needed by oral route.     docusate sodium (COLACE) 100 MG capsule TAKE 1 CAPSULE (100 MG TOTAL) BY MOUTH DAILY AS NEEDED FOR MILD CONSTIPATION. 30 capsule 0   doxycycline (VIBRA-TABS) 100 MG tablet TAKE ONE TABLET BY MOUTH 2 TIMES DAILY FOR 10 DAYS.     ezetimibe (ZETIA) 10 MG tablet Take 1 tablet (10 mg total) by mouth every evening. 90 tablet 3   FLOVENT HFA 110 MCG/ACT inhaler Inhale 2 puffs into the lungs 2 (two) times daily as needed (shortness of breath).  12   fluticasone (FLONASE) 50 MCG/ACT nasal spray Place 2 sprays into both nostrils daily. (Patient taking differently: Place 2 sprays into both nostrils as  needed for allergies.) 16 g 12   hydrALAZINE (APRESOLINE) 25 MG tablet Take by mouth.     hydrocortisone (ANUSOL-HC) 2.5 % rectal cream Place rectally.     hydrocortisone 2.5 % cream Apply 1 application. topically daily as needed (hemorrhoids).     JANUVIA 50 MG tablet Take 50 mg by mouth daily.     loperamide (IMODIUM A-D) 2 MG tablet Take 1-2 mg by mouth 4 (four) times daily as needed for diarrhea or loose stools.     LOTEMAX SM 0.38 % GEL Place 1 drop into both eyes as needed (redness).  1   meloxicam (MOBIC) 15 MG tablet TAKE 1 TABLET BY MOUTH EVERY DAY (Patient taking differently: Take 15 mg by mouth daily as needed for pain.) 30 tablet 3   metoprolol tartrate (LOPRESSOR) 25 MG tablet Take 12.5 mg by mouth daily.     metroNIDAZOLE (FLAGYL) 500 MG tablet Take 1 tablet (500 mg total) by mouth 2 (two) times daily. 14 tablet 0   Misc. Devices MISC Blood pressure monitor Length of need:  99 months     Multiple Vitamin (MULTI-VITAMIN DAILY PO) Take 1 tablet by mouth daily.     naloxone (NARCAN) nasal spray 4 mg/0.1 mL Place 1 spray into the nose as needed (opioid overdose).     nitroGLYCERIN (NITROSTAT) 0.3 MG SL tablet Place 0.3 mg under the tongue every 5 (five) minutes as needed.     nitroGLYCERIN (NITROSTAT) 0.4 MG SL tablet      olopatadine (PATANOL) 0.1 % ophthalmic solution Place 1 drop into both eyes 2 (two) times daily. (Patient taking differently: Place 1 drop into both eyes 2 (two) times daily as needed for allergies.) 5 mL prn   ondansetron (ZOFRAN-ODT) 4 MG disintegrating tablet Take 1 tablet (4 mg total) by mouth every 8 (eight) hours as needed for nausea or vomiting. 20 tablet 0   oxyCODONE-acetaminophen (PERCOCET) 10-325 MG tablet Take 1 tablet by mouth 3 (three) times daily as needed.     oxyCODONE-acetaminophen (PERCOCET/ROXICET) 5-325 MG tablet Take 1 tablet by mouth every 6 (six) hours as needed.     Peppermint Oil (IBGARD PO) Take 1 Dose by mouth as needed (upset stomach).      Probiotic Product (PROBIOTIC PO) Take 1 capsule by mouth daily as needed (upset stomach).     venlafaxine  XR (EFFEXOR-XR) 150 MG 24 hr capsule Take 150 mg by mouth daily.     zolpidem (AMBIEN) 10 MG tablet Take 10 mg by mouth at bedtime as needed for sleep.      No current facility-administered medications on file prior to visit.    Allergies  Allergen Reactions   Codeine Other (See Comments)    HALLUCINATIONS    Propoxyphene N-Acetaminophen Other (See Comments)    HALLUCINATIONS   Sulfonamide Derivatives Hives, Itching and Swelling   Alka-Seltzer [Aspirin Effervescent] Other (See Comments)    "like she's fading away", jittery    Amlodipine     In high doses causes stomach issues    Amoxicillin-Pot Clavulanate     In high doses causes stomach issues    Norvasc [Amlodipine Besylate] Other (See Comments)    "passed out"   Red Dye #40 (Allura Red) Other (See Comments)    "burning and tingling sensation"    Singulair [Montelukast Sodium] Other (See Comments)    Increased depression, sadness   Sulfa Antibiotics Hives, Itching and Swelling   Sulfur Hives, Itching and Swelling   Bupropion Anxiety and Other (See Comments)    headache   Buspirone Other (See Comments)    Abdominal bloating and increased intestinal gas   Latex Hives and Itching   Metformin And Related Diarrhea    Other Reaction(s): Abdominal Pain   Social History   Occupational History   Occupation: Disabled    Employer: RETIRED    Comment: Fibromyalgia, Depression, Anxiety  Tobacco Use   Smoking status: Former    Current packs/day: 0.00    Average packs/day: 0.5 packs/day for 37.0 years (18.5 ttl pk-yrs)    Types: Cigarettes    Start date: 06/06/1975    Quit date: 06/05/2012    Years since quitting: 10.9    Passive exposure: Never   Smokeless tobacco: Never  Vaping Use   Vaping status: Never Used  Substance and Sexual Activity   Alcohol use: No   Drug use: No   Sexual activity: Not Currently    Birth  control/protection: Post-menopausal   Family History  Problem Relation Age of Onset   Cancer Mother        liver   Diabetes Mother    Hyperlipidemia Mother    Hypertension Mother    Stroke Mother    Cancer Maternal Grandmother        breast   Anesthesia problems Sister        hard to wake up post-op   Heart disease Father    Hyperlipidemia Brother    HIV/AIDS Brother    Cancer Maternal Grandfather        colon   Diabetes Brother    Cancer Brother        lung   Cancer Paternal Grandfather        COLON   Immunization History  Administered Date(s) Administered   Influenza,inj,Quad PF,6+ Mos 03/10/2015, 04/12/2016, 03/24/2017   Influenza-Unspecified 03/17/2014   PFIZER(Purple Top)SARS-COV-2 Vaccination 10/02/2019, 10/23/2019, 05/12/2020   Pfizer Covid-19 Vaccine Bivalent Booster 26yrs & up 05/23/2021   Pneumococcal Polysaccharide-23 03/10/2015   Td 02/16/2012   Tdap 02/16/2012, 11/03/2021     Review of Systems: Negative except as noted in the HPI.   Objective: There were no vitals filed for this visit.  DAISHA HORSE is a pleasant 67 y.o. female in NAD. AAO X 3.  Title   Diabetic Foot Exam - detailed Date & Time: 05/02/2023  3:45 PM Diabetic  Foot exam was performed with the following findings: Yes  Visual Foot Exam completed.: Yes  Is there a history of foot ulcer?: No Is there a foot ulcer now?: No Is there swelling?: No Is there elevated skin temperature?: No Is there abnormal foot shape?: Yes (Comment: HAV with bunion deformity b/l.) Is there a claw toe deformity?: No Are the toenails long?: Yes Are the toenails thick?: Yes Are the toenails ingrown?: No Is the skin thin, fragile, shiny and hairless?": No Normal Range of Motion?: Yes Is there foot or ankle muscle weakness?: No Do you have pain in calf while walking?: No Are the shoes appropriate in style and fit?: Yes Can the patient see the bottom of their feet?: Yes Pulse Foot Exam completed.: Yes   Right  Posterior Tibialis: Present Left posterior Tibialis: Present   Right Dorsalis Pedis: Present Left Dorsalis Pedis: Present     Sensory Foot Exam Completed.: Yes Semmes-Weinstein Monofilament Test "+" means "has sensation" and "-" means "no sensation"  R Foot Test Control: Pos L Foot Test Control: Pos   R Site 1-Great Toe: Pos L Site 1-Great Toe: Pos   R Site 4: Pos L Site 4: Pos   R site 5: Pos L Site 5: Pos  R Site 6: Pos L Site 6: Pos     Image components are not supported.   Image components are not supported. Image components are not supported.  T[place] Right vibratory: present Left vibratory: present  Comments Porokeratotic lesion submet head 2 left foot     Lab Results  Component Value Date   HGBA1C 6.0 (H) 10/18/2017   No results found. ADA Risk Categorization: Low Risk :  Patient has all of the following: Intact protective sensation No prior foot ulcer  No severe deformity Pedal pulses present  High Risk  Patient has one or more of the following: Loss of protective sensation Absent pedal pulses Severe Foot deformity History of foot ulcer  Assessment: 1. Pain due to onychomycosis of toenails of both feet   2. Porokeratosis   3. Hallux valgus, acquired, bilateral   4. Controlled type 2 diabetes mellitus without complication, without long-term current use of insulin (HCC)   5. Encounter for diabetic foot exam (HCC)     Plan: -Consent given for treatment as described below: -Examined patient. -Diabetic foot examination performed today. -Continue diabetic foot care principles: inspect feet daily, monitor glucose as recommended by PCP and/or Endocrinologist, and follow prescribed diet per PCP, Endocrinologist and/or dietician. -Mycotic toenails 1-5 bilaterally were debrided in length and girth with sterile nail nippers and dremel without incident. -Porokeratotic lesion(s) submet head 2 left foot pared and enucleated with sterile currette without  incident. Total number of lesions debrided=1. -Patient/POA to call should there be question/concern in the interim. Return in about 3 months (around 08/02/2023).  Freddie Breech, DPM      [place] LOCATION: [address], [place] [address]                   Office [phone]   [facility] LOCATION: [address] [place], [place] [address] Office [phone]

## 2023-05-09 ENCOUNTER — Ambulatory Visit: Payer: Medicare Other | Admitting: Rheumatology

## 2023-05-24 ENCOUNTER — Ambulatory Visit: Payer: Medicare Other | Admitting: Podiatry

## 2023-05-25 ENCOUNTER — Ambulatory Visit: Payer: Medicare Other | Attending: Rheumatology | Admitting: Rheumatology

## 2023-05-25 ENCOUNTER — Encounter: Payer: Self-pay | Admitting: Rheumatology

## 2023-05-25 VITALS — BP 163/107 | HR 73 | Resp 15 | Ht 67.0 in | Wt 173.8 lb

## 2023-05-25 DIAGNOSIS — F419 Anxiety disorder, unspecified: Secondary | ICD-10-CM

## 2023-05-25 DIAGNOSIS — M25561 Pain in right knee: Secondary | ICD-10-CM

## 2023-05-25 DIAGNOSIS — R5383 Other fatigue: Secondary | ICD-10-CM | POA: Diagnosis not present

## 2023-05-25 DIAGNOSIS — Z8639 Personal history of other endocrine, nutritional and metabolic disease: Secondary | ICD-10-CM

## 2023-05-25 DIAGNOSIS — K5730 Diverticulosis of large intestine without perforation or abscess without bleeding: Secondary | ICD-10-CM

## 2023-05-25 DIAGNOSIS — I495 Sick sinus syndrome: Secondary | ICD-10-CM

## 2023-05-25 DIAGNOSIS — E559 Vitamin D deficiency, unspecified: Secondary | ICD-10-CM

## 2023-05-25 DIAGNOSIS — L409 Psoriasis, unspecified: Secondary | ICD-10-CM

## 2023-05-25 DIAGNOSIS — F5104 Psychophysiologic insomnia: Secondary | ICD-10-CM | POA: Diagnosis not present

## 2023-05-25 DIAGNOSIS — Z8719 Personal history of other diseases of the digestive system: Secondary | ICD-10-CM

## 2023-05-25 DIAGNOSIS — F32A Depression, unspecified: Secondary | ICD-10-CM

## 2023-05-25 DIAGNOSIS — M797 Fibromyalgia: Secondary | ICD-10-CM

## 2023-05-25 DIAGNOSIS — M7061 Trochanteric bursitis, right hip: Secondary | ICD-10-CM

## 2023-05-25 DIAGNOSIS — G8929 Other chronic pain: Secondary | ICD-10-CM

## 2023-05-25 DIAGNOSIS — Z950 Presence of cardiac pacemaker: Secondary | ICD-10-CM

## 2023-05-25 DIAGNOSIS — M47816 Spondylosis without myelopathy or radiculopathy, lumbar region: Secondary | ICD-10-CM

## 2023-05-25 DIAGNOSIS — M62838 Other muscle spasm: Secondary | ICD-10-CM | POA: Diagnosis not present

## 2023-05-25 DIAGNOSIS — L659 Nonscarring hair loss, unspecified: Secondary | ICD-10-CM

## 2023-05-25 DIAGNOSIS — I10 Essential (primary) hypertension: Secondary | ICD-10-CM

## 2023-05-25 DIAGNOSIS — M8589 Other specified disorders of bone density and structure, multiple sites: Secondary | ICD-10-CM

## 2023-05-25 DIAGNOSIS — M7062 Trochanteric bursitis, left hip: Secondary | ICD-10-CM

## 2023-05-25 DIAGNOSIS — Z8709 Personal history of other diseases of the respiratory system: Secondary | ICD-10-CM

## 2023-05-25 DIAGNOSIS — K5732 Diverticulitis of large intestine without perforation or abscess without bleeding: Secondary | ICD-10-CM

## 2023-05-25 MED ORDER — LIDOCAINE HCL 1 % IJ SOLN
0.5000 mL | INTRAMUSCULAR | Status: AC | PRN
  Administered 2023-05-25: .5 mL

## 2023-05-25 MED ORDER — TRIAMCINOLONE ACETONIDE 40 MG/ML IJ SUSP
20.0000 mg | INTRAMUSCULAR | Status: AC | PRN
  Administered 2023-05-25: 20 mg via INTRAMUSCULAR

## 2023-05-25 NOTE — Progress Notes (Signed)
Office Visit Note  Patient: [name]             Date of Birth: 12-26-1955           MRN: [id number]             PCP: Porfirio Oar, PA Referring: Porfirio Oar, PA Visit Date: 05/25/2023 Occupation: @GUAROCC @  Subjective:  Generalized pain  History of Present Illness: [name] is a 67 y.o. female with osteoarthritis, degenerative disc disease and fibromyalgia syndrome.  She returns today after last visit on Nov 02, 2022.  She continues to have some discomfort in the trochanteric region.  She had left trochanteric bursa injection on Nov 02, 2022.  She is having recurrence of bilateral trochanteric bursitis.  She has generalized pain and discomfort from fibromyalgia.  She continues to have intermittent flares especially with the colder temperatures.  She has been experiencing generalized pain currently.  She states she has been having increased pain in her right lower extremity.  She had x-rays by her PCP which were unremarkable per patient.  She continues to have some left trapezius spasm.    Activities of Daily Living:  Patient reports morning stiffness for all day. Patient Reports nocturnal pain.  Difficulty dressing/grooming: Reports Difficulty climbing stairs: Reports Difficulty getting out of chair: Reports Difficulty using hands for taps, buttons, cutlery, and/or writing: Denies  Review of Systems  Constitutional:  Positive for fatigue.  HENT:  Positive for mouth dryness. Negative for mouth sores.   Eyes:  Positive for dryness.  Respiratory:  Negative for shortness of breath.   Cardiovascular:  Negative for chest pain and palpitations.  Gastrointestinal:  Negative for blood in stool, constipation and diarrhea.  Endocrine: Negative for increased urination.  Genitourinary:  Negative for involuntary urination.  Musculoskeletal:  Positive for joint pain, gait problem, joint pain, joint swelling, myalgias, muscle weakness, morning stiffness, muscle tenderness and  myalgias.  Skin:  Positive for hair loss. Negative for color change, rash and sensitivity to sunlight.  Allergic/Immunologic: Positive for susceptible to infections.  Neurological:  Positive for dizziness and headaches.  Hematological:  Positive for swollen glands.  Psychiatric/Behavioral:  Positive for depressed mood and sleep disturbance. The patient is nervous/anxious.     PMFS History:  Patient Active Problem List   Diagnosis Date Noted   Chronic prescription benzodiazepine use 10/20/2021   Chronic prescription opiate use 10/20/2021   Vasomotor instability 10/20/2021   Estrogen deficiency 10/20/2021   Prediabetes 09/03/2021   Dyshidrosis (pompholyx) 11/24/2020   Abdominal pain, LLQ 08/10/2020   Arthritis 08/10/2020   Change in bowel habit 08/10/2020   Colon cancer screening 08/10/2020   Diverticulitis of colon 08/10/2020   Flatulence, eructation and gas pain 08/10/2020   Hemorrhage of colon due to diverticulosis 08/10/2020   Nausea 08/10/2020   Vomiting without nausea 08/10/2020   Irritable bowel syndrome with diarrhea 01/21/2020   Laryngopharyngeal reflux (LPR) 05/24/2018   TMJ (sprain of temporomandibular joint), initial encounter 05/24/2018   Aortic atherosclerosis (HCC) 10/18/2017   Acute coronary syndrome (HCC) 02/09/2017   Right knee pain 07/04/2016   Pain in right ankle and joints of right foot 07/04/2016   Psoriasis 03/10/2015   Right foot pain 11/13/2014   Ethmoid sinusitis 11/13/2014   Abdominal pain, epigastric 11/13/2014   Vitamin D deficiency 11/13/2014   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   Sick sinus syndrome (HCC) 07/08/2014   Chest pain at rest 07/05/2014   Chronic rhinitis 10/22/2013   Fatty liver 10/22/2013  Interstitial cystitis 10/22/2013   Hip pain, right 10/22/2013   NASH (nonalcoholic steatohepatitis) 10/22/2013   Chronic insomnia 09/25/2012   HTN (hypertension) 09/25/2012   Hyperlipidemia 09/25/2012   Hyperlipidemia associated with type 2  diabetes mellitus (HCC) 09/25/2012   Diabetes mellitus type 2, controlled, without complications (HCC) 05/22/2012   GERD 12/04/2008   History of colonic polyps 12/04/2008   CONSTIPATION 10/21/2008   Fibromyalgia 10/21/2008   LACTOSE INTOLERANCE 11/20/2007   Chest pain, precordial 11/20/2007   Lactose intolerance 11/20/2007   Anxiety and depression 08/02/2007   Osteoporosis 08/02/2007   Recurrent major depressive disorder, in remission (HCC) 08/02/2007   Moderate episode of recurrent major depressive disorder (HCC) 08/02/2007   Diverticulosis of large intestine 11/16/2006   EXTERNAL HEMORRHOIDS 05/12/2005    Past Medical History:  Diagnosis Date   Allergy    Anal fissure 02/2012   Anxiety    Arthritis    Asthma    Bursitis    right hip   Complication of anesthesia     woke up during a colonscopy, difficult to wake up post-op sometimes , requires lots of blankets to stay warm post op   Depression    Diabetes mellitus without complication (HCC)    type 2 diet controlled   Fatty liver    sees dr Loreta Ave for   Fibromyalgia    Gallstones 02/15/2015   GERD (gastroesophageal reflux disease)    Hepatitis A infection 1979   Hyperlipidemia    Hypertension    Irritable bowel syndrome (IBS)    Presence of permanent cardiac pacemaker 07/2014   sees dr allred   Psoriasis    left foot   Symptomatic bradycardia    a. s/p MDT MRI compatible dual chamber pacemaker 07/2014 Dr Allred   Tarsal tunnel syndrome of left side     Family History  Problem Relation Age of Onset   Cancer Mother        liver   Diabetes Mother    Hyperlipidemia Mother    Hypertension Mother    Stroke Mother    Heart disease Father    Anesthesia problems Sister        hard to wake up post-op   Hyperlipidemia Brother    HIV/AIDS Brother    Diabetes Brother    Cancer Brother        lung   Cancer Maternal Grandmother        breast   Cancer Maternal Grandfather        colon   Cancer Paternal Grandfather         COLON   Past Surgical History:  Procedure Laterality Date   ABDOMINAL HYSTERECTOMY  09/1994   partial   APPENDECTOMY     BILATERAL SALPINGOOPHORECTOMY Bilateral 04/2011   CARDIAC CATHETERIZATION  10/08/2007   CESAREAN SECTION     x 1   CHOLECYSTECTOMY N/A 02/16/2015   Procedure: LAPAROSCOPIC CHOLECYSTECTOMY WITH INTRAOPERATIVE CHOLANGIOGRAM;  Surgeon: Luretha Murphy, MD;  Location: [facility];  Service: General;  Laterality: N/A;   COLONOSCOPY WITH PROPOFOL N/A 08/19/2016   Procedure: COLONOSCOPY WITH PROPOFOL;  Surgeon: Jeani Hawking, MD;  Location: [facility];  Service: Endoscopy;  Laterality: N/A;   COLONOSCOPY WITH PROPOFOL N/A 10/22/2021   Procedure: COLONOSCOPY WITH PROPOFOL;  Surgeon: Jeani Hawking, MD;  Location: [facility];  Service: Gastroenterology;  Laterality: N/A;   CYSTOSCOPY W/ URETERAL STENT PLACEMENT  08/04/2009   EXAMINATION UNDER ANESTHESIA  02/17/2012   Procedure: EXAM UNDER ANESTHESIA;  Surgeon: Valarie Merino,  MD;  Location: [facility];  Service: General;  Laterality: N/A;  Exam under anesthesia with lateral internal sphincterotomy   LAPAROSCOPIC LYSIS INTESTINAL ADHESIONS  08/04/2009   LEFT HEART CATH AND CORONARY ANGIOGRAPHY N/A 02/10/2017   Procedure: LEFT HEART CATH AND CORONARY ANGIOGRAPHY;  Surgeon: Orpah Cobb, MD;  Location: [facility];  Service: Cardiovascular;  Laterality: N/A;   PERMANENT PACEMAKER INSERTION N/A 07/08/2014   MDT MRI compatible dual chamber pacemaker implanted by Dr Johney Frame for symptomatic bradycardia   POLYPECTOMY  10/22/2021   Procedure: POLYPECTOMY;  Surgeon: Jeani Hawking, MD;  Location: [facility];  Service: Gastroenterology;;   SHOULDER ARTHROSCOPY W/ ROTATOR CUFF REPAIR Left 1999   SMALL INTESTINE SURGERY     SPHINCTEROTOMY  02/17/2012   Procedure: SPHINCTEROTOMY;  Surgeon: Valarie Merino, MD;  Location:  SURGERY CENTER;  Service: General;  Laterality: N/A;   Social History   Social History Narrative    Lives alone.  Daughter and granddaughter live in [place], [place]. Pt exercise sometimes.      Immunization History  Administered Date(s) Administered   Influenza,inj,Quad PF,6+ Mos 03/10/2015, 04/12/2016, 03/24/2017   Influenza-Unspecified 03/17/2014   PFIZER(Purple Top)SARS-COV-2 Vaccination 10/02/2019, 10/23/2019, 05/12/2020   Pfizer Covid-19 Vaccine Bivalent Booster 55yrs & up 05/23/2021   Pneumococcal Polysaccharide-23 03/10/2015   Td 02/16/2012   Tdap 02/16/2012, 11/03/2021     Objective: Vital Signs: BP (!) 149/92 (BP Location: Left Arm, Patient Position: Sitting, Cuff Size: Normal)   Pulse 67   Resp 15   Ht 5\' 7"  (1.702 m)   Wt 173 lb 12.8 oz (78.8 kg)   BMI 27.22 kg/m    Physical Exam Vitals and nursing note reviewed.  Constitutional:      Appearance: She is well-developed.  HENT:     Head: Normocephalic and atraumatic.  Eyes:     Conjunctiva/sclera: Conjunctivae normal.  Cardiovascular:     Rate and Rhythm: Normal rate and regular rhythm.     Heart sounds: Normal heart sounds.  Pulmonary:     Effort: Pulmonary effort is normal.     Breath sounds: Normal breath sounds.  Abdominal:     General: Bowel sounds are normal.     Palpations: Abdomen is soft.  Musculoskeletal:     Cervical back: Normal range of motion.  Lymphadenopathy:     Cervical: No cervical adenopathy.  Skin:    General: Skin is warm and dry.     Capillary Refill: Capillary refill takes less than 2 seconds.  Neurological:     Mental Status: She is alert and oriented to person, place, and time.  Psychiatric:        Behavior: Behavior normal.      Musculoskeletal Exam: Cervical, thoracic and lumbar spine were in good range of motion.  She had bilateral trapezius spasm.  Shoulder joints, elbows, wrist joints, MCPs PIPs and DIPs with good range of motion with no synovitis.  Hip joints and knee joints with good range of motion.  She had tenderness over bilateral trochanteric region.  There was no  warmth swelling or effusion in the knee joints.  There was no tenderness over ankles or MTPs.  CDAI Exam: CDAI Score: -- Patient Global: --; Provider Global: -- Swollen: --; Tender: -- Joint Exam 05/25/2023   No joint exam has been documented for this visit   There is currently no information documented on the homunculus. Go to the Rheumatology activity and complete the homunculus joint exam.  Investigation: No additional findings.  Imaging: No results found.  Recent Labs: Lab Results  Component Value Date   WBC 8.4 07/10/2021   HGB 13.7 07/10/2021   PLT 276 07/10/2021   NA 136 07/10/2021   K 3.5 07/10/2021   CL 103 07/10/2021   CO2 24 07/10/2021   GLUCOSE 135 (H) 07/10/2021   BUN 6 (L) 07/10/2021   CREATININE 0.72 07/10/2021   BILITOT 0.6 07/10/2021   ALKPHOS 44 07/10/2021   AST 28 07/10/2021   ALT 37 07/10/2021   PROT 7.5 07/10/2021   ALBUMIN 4.0 07/10/2021   CALCIUM 8.9 07/10/2021   GFRAA 110 10/10/2019    Speciality Comments: No specialty comments available.  Procedures:  Trigger Point Inj  Date/Time: 05/25/2023 12:08 PM  Performed by: Pollyann Savoy, MD Authorized by: Pollyann Savoy, MD   Consent Given by:  Patient Site marked: the procedure site was marked   Timeout: prior to procedure the correct patient, procedure, and site was verified   Indications:  Muscle spasm and pain Total # of Trigger Points:  1 Location: neck   Needle Size:  27 G Approach:  Dorsal Medications #1:  0.5 mL lidocaine 1 %; 20 mg triamcinolone acetonide 40 MG/ML Patient tolerance:  Patient tolerated the procedure well with no immediate complications  Allergies: Codeine, Propoxyphene n-acetaminophen, Sulfonamide derivatives, Alka-seltzer [aspirin effervescent], Amlodipine, Amoxicillin-pot clavulanate, Norvasc [amlodipine besylate], Red dye #40 (allura red), Singulair [montelukast sodium], Sulfa antibiotics, Sulfur, Bupropion, Buspirone, Latex, and Metformin and related    Assessment / Plan:     Visit Diagnoses: Fibromyalgia-she continues to have generalized pain and discomfort.  She had generalized hyperalgesia and positive tender points.  Need for regular exercise and stretching was emphasized.  Trapezius muscle spasm-she had been trapezius spasm with more pain on the left side.  After informed consent was obtained per patient's request left trapezius region was injected with lidocaine and Kenalog.  She tolerated the procedure well.  Chronic insomnia -she has been taking Ambien 10 mg at bedtime for insomnia.  She has difficulty sleeping despite taking Ambien sometimes.  Other fatigue-she gives history of increased fatigue due to fibromyalgia.  Trochanteric bursitis of both hips-she can use to have discomfort in the trochanteric region.  IT band stretches were discussed.  A handout on IT band stretches was placed in the AVS.  Chronic pain of right knee-patient states she had x-rays done by her PCP which were unremarkable.  Spondylosis of lumbar spine-she continues to have some lower back pain.  Psoriasis-she has recurrent psoriasis on her foot.  She uses topical agents.  Osteopenia of multiple sites - DEXA 07/04/2017 AP spine BMD 0.920 with T score -2.1.  She had an updated DEXA ordered by her PCP--we do not have these records to review.  Vitamin D deficiency-she takes vitamin D supplement.  Other medical problems listed as follows:  Hair loss-chronic  Diverticulosis of large intestine without hemorrhage  Essential hypertension-blood pressure was elevated at 149/92.  She was advised to monitor blood pressure closely and follow-up with the PCP.  History of IBS  History of asthma  Diverticulitis of colon  History of hypercholesterolemia  History of gastroesophageal reflux (GERD)  Pacemaker  Sick sinus syndrome (HCC)  Anxiety and depression  Orders: Orders Placed This Encounter  Procedures   Trigger Point Inj   No orders of the  defined types were placed in this encounter.    Follow-Up Instructions: Return in about 6 months (around 11/23/2023) for OA,FMS.   Pollyann Savoy, MD  Note - This  record has been created using [employer].  Chart creation errors have been sought, but may not always  have been located. Such creation errors do not reflect on  the standard of medical care.

## 2023-05-25 NOTE — Patient Instructions (Signed)
 Cervical Strain and Sprain Rehab Ask your health care provider which exercises are safe for you. Do exercises exactly as told by your health care provider and adjust them as directed. It is normal to feel mild stretching, pulling, tightness, or discomfort as you do these exercises. Stop right away if you feel sudden pain or your pain gets worse. Do not begin these exercises until told by your health care provider. Stretching and range-of-motion exercises Cervical side bending  Using good posture, sit on a stable chair or stand up. Without moving your shoulders, slowly tilt your left / right ear to your shoulder until you feel a stretch in the neck muscles on the opposite side. You should be looking straight ahead. Hold for __________ seconds. Repeat with the other side of your neck. Repeat __________ times. Complete this exercise __________ times a day. Cervical rotation  Using good posture, sit on a stable chair or stand up. Slowly turn your head to the side as if you are looking over your left / right shoulder. Keep your eyes level with the ground. Stop when you feel a stretch along the side and the back of your neck. Hold for __________ seconds. Repeat this by turning to your other side. Repeat __________ times. Complete this exercise __________ times a day. Thoracic extension and pectoral stretch  Roll a towel or a small blanket so it is about 4 inches (10 cm) in diameter. Lie down on your back on a firm surface. Put the towel in the middle of your back across your spine. It should not be under your shoulder blades. Put your hands behind your head and let your elbows fall out to your sides. Hold for __________ seconds. Repeat __________ times. Complete this exercise __________ times a day. Strengthening exercises Upper cervical flexion  Lie on your back with a thin pillow behind your head or a small, rolled-up towel under your neck. Gently tuck your chin toward your chest and nod  your head down to look toward your feet. Do not lift your head off the pillow. Hold for __________ seconds. Release the tension slowly. Relax your neck muscles completely before you repeat this exercise. Repeat __________ times. Complete this exercise __________ times a day. Cervical extension  Stand about 6 inches (15 cm) away from a wall, with your back facing the wall. Place a soft object, about 6-8 inches (15-20 cm) in diameter, between the back of your head and the wall. A soft object could be a small pillow, a ball, or a folded towel. Gently tilt your head back and press into the soft object. Keep your jaw and forehead relaxed. Hold for __________ seconds. Release the tension slowly. Relax your neck muscles completely before you repeat this exercise. Repeat __________ times. Complete this exercise __________ times a day. Posture and body mechanics Body mechanics refer to the movements and positions of your body while you do your daily activities. Posture is part of body mechanics. Good posture and healthy body mechanics can help to relieve stress in your body's tissues and joints. Good posture means that your spine is in its natural S-curve position (your spine is neutral), your shoulders are pulled back slightly, and your head is not tipped forward. The following are general guidelines for using improved posture and body mechanics in your everyday activities. Sitting  When sitting, keep your spine neutral and keep your feet flat on the floor. Use a footrest, if needed, and keep your thighs parallel to the floor. Avoid rounding  your shoulders. Avoid tilting your head forward. When working at a desk or a computer, keep your desk at a height where your hands are slightly lower than your elbows. Slide your chair under your desk so you are close enough to maintain good posture. When working at a computer, place your monitor at a height where you are looking straight ahead and you do not have to  tilt your head forward or downward to look at the screen. Standing  When standing, keep your spine neutral and keep your feet about hip-width apart. Keep a slight bend in your knees. Your ears, shoulders, and hips should line up. When you do a task in which you stand in one place for a long time, place one foot up on a stable object that is 2-4 inches (5-10 cm) high, such as a footstool. This helps keep your spine neutral. Resting When lying down and resting, avoid positions that are most painful for you. Try to support your neck in a neutral position. You can use a contour pillow or a small rolled-up towel. Your pillow should support your neck but not push on it. This information is not intended to replace advice given to you by your health care provider. Make sure you discuss any questions you have with your health care provider. Document Revised: 09/26/2022 Document Reviewed: 12/13/2021 Elsevier Patient Education  2024 [employer] Iliotibial Band Syndrome Rehab Ask your health care provider which exercises are safe for you. Do exercises exactly as told by your provider and adjust them as told. It's normal to feel mild stretching, pulling, tightness, or discomfort as you do these exercises. Stop right away if you feel sudden pain or your pain gets a lot worse. Do not begin these exercises until told by your provider. Stretching and range-of-motion exercises These exercises warm up your muscles and joints. They also improve the movement and flexibility of your hip and pelvis. Quadriceps stretch, prone  Lie face down (prone) on a firm surface like a bed or padded floor. Bend your left / right knee. Reach back to hold your ankle or pant leg. If you can't reach your ankle or pant leg, use a belt looped around your foot and grab the belt instead. Gently pull your heel toward your butt. Your knee should not slide out to the side. You should feel a stretch in the front of your thigh and knee, also  called the quadriceps. Hold this position for __________ seconds. Repeat __________ times. Complete this exercise __________ times a day. Iliotibial band stretch The iliotibial band is a strip of tissue that runs along the outside of your hip down to your knee. Lie on your side with your left / right leg on top. Bend both knees and grab your left / right ankle. Stretch out your bottom arm to help you balance. Slowly bring your top knee back so your thigh goes behind your back. Slowly lower your top leg toward the floor until you feel a gentle stretch on the outside of your left / right hip and thigh. If you don't feel a stretch and your knee won't go farther, place the heel of your other foot on top of your knee and pull your knee down toward the floor with your foot. Hold this position for __________ seconds. Repeat __________ times. Complete this exercise __________ times a day. Strengthening exercises These exercises build strength and endurance in your hip and pelvis. Endurance means your muscles can keep working even when they're  tired. Straight leg raises, side-lying This exercise strengthens the muscles that rotate the leg at the hip and move it away from your body. These muscles are called hip abductors. Lie on your side with your left / right leg on top. Lie so your head, shoulder, hip, and knee line up. You can bend your bottom knee to help you balance. Roll your hips slightly forward so they're stacked directly over each other. Your left / right knee should face forward. Tense the muscles in your outer thigh and hip. Lift your top leg 4-6 inches (10-15 cm) off the ground. Hold this position for __________ seconds. Slowly lower your leg back down to the starting position. Let your muscles fully relax before doing this exercise again. Repeat __________ times. Complete this exercise __________ times a day. Leg raises, prone This exercise strengthens the muscles that move the hips  backward. These muscles are called hip extensors. Lie face down (prone) on your bed or a firm surface. You can put a pillow under your hips for comfort and to support your lower back. Bend your left / right knee so your foot points straight up toward the ceiling. Keep the other leg straight and behind you. Squeeze your butt muscles. Lift your left / right thigh off the firm surface. Do not let your back arch. Tense your thigh muscle as hard as you can without having more knee pain. Hold this position for __________ seconds. Slowly lower your leg to the starting position. Allow your leg to relax all the way. Repeat __________ times. Complete this exercise __________ times a day. Hip hike  Stand sideways on a bottom step. Place your feet so that your left / right leg is on the step, and the other foot is hanging off the side. If you need support for balance, hold onto a railing or wall. Keep your knees straight and your abdomen square, meaning your hips are level. Then, lift your left / right hip up toward the ceiling. Slowly let your leg that's hanging off the step lower towards the floor. Your foot should get closer to the ground. Do not lean or bend your knees during this movement. Repeat __________ times. Complete this exercise __________ times a day. This information is not intended to replace advice given to you by your health care provider. Make sure you discuss any questions you have with your health care provider. Document Revised: 08/05/2022 Document Reviewed: 08/05/2022 Elsevier Patient Education  2024 [employer].

## 2023-06-06 ENCOUNTER — Ambulatory Visit (INDEPENDENT_AMBULATORY_CARE_PROVIDER_SITE_OTHER): Payer: Medicare Other

## 2023-06-06 DIAGNOSIS — I495 Sick sinus syndrome: Secondary | ICD-10-CM | POA: Diagnosis not present

## 2023-06-13 LAB — CUP PACEART REMOTE DEVICE CHECK
Battery Remaining Longevity: 19 mo
Battery Voltage: 2.91 V
Brady Statistic AP VP Percent: 0.04 %
Brady Statistic AP VS Percent: 99.87 %
Brady Statistic AS VP Percent: 0 %
Brady Statistic AS VS Percent: 0.1 %
Brady Statistic RA Percent Paced: 99.89 %
Brady Statistic RV Percent Paced: 0.04 %
Date Time Interrogation Session: 20250103163709
Implantable Lead Connection Status: 753985
Implantable Lead Connection Status: 753985
Implantable Lead Implant Date: 20160202
Implantable Lead Implant Date: 20160202
Implantable Lead Location: 753859
Implantable Lead Location: 753860
Implantable Lead Model: 5076
Implantable Lead Model: 5076
Implantable Pulse Generator Implant Date: 20160202
Lead Channel Impedance Value: 437 Ohm
Lead Channel Impedance Value: 456 Ohm
Lead Channel Impedance Value: 494 Ohm
Lead Channel Impedance Value: 551 Ohm
Lead Channel Pacing Threshold Amplitude: 0.375 V
Lead Channel Pacing Threshold Amplitude: 0.875 V
Lead Channel Pacing Threshold Pulse Width: 0.4 ms
Lead Channel Pacing Threshold Pulse Width: 0.4 ms
Lead Channel Sensing Intrinsic Amplitude: 16.625 mV
Lead Channel Sensing Intrinsic Amplitude: 16.625 mV
Lead Channel Sensing Intrinsic Amplitude: 4.875 mV
Lead Channel Sensing Intrinsic Amplitude: 4.875 mV
Lead Channel Setting Pacing Amplitude: 2 V
Lead Channel Setting Pacing Amplitude: 2.5 V
Lead Channel Setting Pacing Pulse Width: 0.4 ms
Lead Channel Setting Sensing Sensitivity: 2.8 mV
Zone Setting Status: 755011
Zone Setting Status: 755011

## 2023-07-18 NOTE — Progress Notes (Signed)
Remote pacemaker transmission.

## 2023-07-18 NOTE — Addendum Note (Signed)
Addended by: Geralyn Flash D on: 07/18/2023 12:50 PM   Modules accepted: Orders

## 2023-07-26 ENCOUNTER — Ambulatory Visit: Payer: Medicare Other | Admitting: Podiatry

## 2023-09-05 ENCOUNTER — Ambulatory Visit (INDEPENDENT_AMBULATORY_CARE_PROVIDER_SITE_OTHER): Payer: Medicare Other

## 2023-09-05 DIAGNOSIS — I495 Sick sinus syndrome: Secondary | ICD-10-CM

## 2023-09-07 LAB — CUP PACEART REMOTE DEVICE CHECK
Battery Remaining Longevity: 18 mo
Battery Voltage: 2.9 V
Brady Statistic AP VP Percent: 0.04 %
Brady Statistic AP VS Percent: 99.88 %
Brady Statistic AS VP Percent: 0 %
Brady Statistic AS VS Percent: 0.08 %
Brady Statistic RA Percent Paced: 99.91 %
Brady Statistic RV Percent Paced: 0.05 %
Date Time Interrogation Session: 20250402142253
Implantable Lead Connection Status: 753985
Implantable Lead Connection Status: 753985
Implantable Lead Implant Date: 20160202
Implantable Lead Implant Date: 20160202
Implantable Lead Location: 753859
Implantable Lead Location: 753860
Implantable Lead Model: 5076
Implantable Lead Model: 5076
Implantable Pulse Generator Implant Date: 20160202
Lead Channel Impedance Value: 418 Ohm
Lead Channel Impedance Value: 437 Ohm
Lead Channel Impedance Value: 475 Ohm
Lead Channel Impedance Value: 551 Ohm
Lead Channel Pacing Threshold Amplitude: 0.375 V
Lead Channel Pacing Threshold Amplitude: 0.875 V
Lead Channel Pacing Threshold Pulse Width: 0.4 ms
Lead Channel Pacing Threshold Pulse Width: 0.4 ms
Lead Channel Sensing Intrinsic Amplitude: 14.75 mV
Lead Channel Sensing Intrinsic Amplitude: 14.75 mV
Lead Channel Sensing Intrinsic Amplitude: 4.875 mV
Lead Channel Sensing Intrinsic Amplitude: 4.875 mV
Lead Channel Setting Pacing Amplitude: 2 V
Lead Channel Setting Pacing Amplitude: 2.5 V
Lead Channel Setting Pacing Pulse Width: 0.4 ms
Lead Channel Setting Sensing Sensitivity: 2.8 mV
Zone Setting Status: 755011
Zone Setting Status: 755011

## 2023-09-09 ENCOUNTER — Encounter: Payer: Self-pay | Admitting: Cardiology

## 2023-09-28 ENCOUNTER — Ambulatory Visit: Admitting: Podiatry

## 2023-10-11 ENCOUNTER — Ambulatory Visit (INDEPENDENT_AMBULATORY_CARE_PROVIDER_SITE_OTHER): Admitting: Podiatry

## 2023-10-11 ENCOUNTER — Encounter: Payer: Self-pay | Admitting: Podiatry

## 2023-10-11 DIAGNOSIS — E119 Type 2 diabetes mellitus without complications: Secondary | ICD-10-CM

## 2023-10-11 DIAGNOSIS — M79674 Pain in right toe(s): Secondary | ICD-10-CM | POA: Diagnosis not present

## 2023-10-11 DIAGNOSIS — M79675 Pain in left toe(s): Secondary | ICD-10-CM

## 2023-10-11 DIAGNOSIS — B351 Tinea unguium: Secondary | ICD-10-CM | POA: Diagnosis not present

## 2023-10-11 NOTE — Progress Notes (Signed)
 This patient returns to my office for at risk foot care.  This patient requires this care by a professional since this patient will be at risk due to having type 2 diabetes.  This patient is unable to cut nails herself since the patient cannot reach her nails.These nails are painful walking and wearing shoes.  This patient presents for at risk foot care today.  General Appearance  Alert, conversant and in no acute stress.  Vascular  Dorsalis pedis and posterior tibial  pulses are palpable  bilaterally.  Capillary return is within normal limits  bilaterally. Temperature is within normal limits  bilaterally.  Neurologic  Senn-Weinstein monofilament wire test within normal limits  bilaterally. Muscle power within normal limits bilaterally.  Nails Thick disfigured discolored nails with subungual debris  from hallux to fifth toes bilaterally. No evidence of bacterial infection or drainage bilaterally.  Orthopedic  No limitations of motion  feet .  No crepitus or effusions noted.  No bony pathology or digital deformities noted.  Skin  normotropic skin with no porokeratosis noted bilaterally.  No signs of infections or ulcers noted.   Psoriasis dermatitis plantar aspect feet  B/L.   Onychomycosis  Pain in right toes  Pain in left toes  Consent was obtained for treatment procedures.   Mechanical debridement of nails 1-5  bilaterally performed with a nail nipper.  Filed with dremel without incident.    Return office visit                     Told patient to return for periodic foot care and evaluation due to potential at risk complications.   Ruffin Cotton DPM

## 2023-10-19 NOTE — Progress Notes (Signed)
 Remote pacemaker transmission.

## 2023-10-19 NOTE — Addendum Note (Signed)
 Addended by: Lott Rouleau A on: 10/19/2023 10:04 AM   Modules accepted: Orders

## 2023-10-25 ENCOUNTER — Ambulatory Visit: Payer: Medicare Other | Admitting: Podiatry

## 2023-11-08 NOTE — Progress Notes (Deleted)
 Office Visit Note  Patient: [name]             Date of Birth: 1956-05-04           MRN: [id number]             PCP: Aldine Humphreys, PA Referring: Aldine Humphreys, PA Visit Date: 11/22/2023 Occupation: @GUAROCC @  Subjective:    History of Present Illness: [name] is a 68 y.o. female with history of fibromyalgia.       Activities of Daily Living:  Patient reports morning stiffness for *** {minute/hour:19697}.   Patient {ACTIONS;DENIES/REPORTS:21021675::Denies} nocturnal pain.  Difficulty dressing/grooming: {ACTIONS;DENIES/REPORTS:21021675::Denies} Difficulty climbing stairs: {ACTIONS;DENIES/REPORTS:21021675::Denies} Difficulty getting out of chair: {ACTIONS;DENIES/REPORTS:21021675::Denies} Difficulty using hands for taps, buttons, cutlery, and/or writing: {ACTIONS;DENIES/REPORTS:21021675::Denies}  No Rheumatology ROS completed.   PMFS History:  Patient Active Problem List   Diagnosis Date Noted  . Chronic prescription benzodiazepine use 10/20/2021  . Chronic prescription opiate use 10/20/2021  . Vasomotor instability 10/20/2021  . Estrogen deficiency 10/20/2021  . Prediabetes 09/03/2021  . Dyshidrosis (pompholyx) 11/24/2020  . Abdominal pain, LLQ 08/10/2020  . Arthritis 08/10/2020  . Change in bowel habit 08/10/2020  . Colon cancer screening 08/10/2020  . Diverticulitis of colon 08/10/2020  . Flatulence, eructation and gas pain 08/10/2020  . Hemorrhage of colon due to diverticulosis 08/10/2020  . Nausea 08/10/2020  . Vomiting without nausea 08/10/2020  . Irritable bowel syndrome with diarrhea 01/21/2020  . Laryngopharyngeal reflux (LPR) 05/24/2018  . TMJ (sprain of temporomandibular joint), initial encounter 05/24/2018  . Aortic atherosclerosis (HCC) 10/18/2017  . Acute coronary syndrome (HCC) 02/09/2017  . Right knee pain 07/04/2016  . Pain in right ankle and joints of right foot 07/04/2016  . Psoriasis 03/10/2015  . Right foot pain  11/13/2014  . Ethmoid sinusitis 11/13/2014  . Abdominal pain, epigastric 11/13/2014  . Vitamin D  deficiency 11/13/2014  . 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 Sick sinus syndrome (HCC) 07/08/2014  . Chest pain at rest 07/05/2014  . Chronic rhinitis 10/22/2013  . Fatty liver 10/22/2013  . Interstitial cystitis 10/22/2013  . Hip pain, right 10/22/2013  . NASH (nonalcoholic steatohepatitis) 10/22/2013  . Chronic insomnia 09/25/2012  . HTN (hypertension) 09/25/2012  . Hyperlipidemia 09/25/2012  . Hyperlipidemia associated with type 2 diabetes mellitus (HCC) 09/25/2012  . Diabetes mellitus type 2, controlled, without complications (HCC) 05/22/2012  . GERD 12/04/2008  . History of colonic polyps 12/04/2008  . CONSTIPATION 10/21/2008  . Fibromyalgia 10/21/2008  . LACTOSE INTOLERANCE 11/20/2007  . Chest pain, precordial 11/20/2007  . Lactose intolerance 11/20/2007  . Anxiety and depression 08/02/2007  . Osteoporosis 08/02/2007  . Recurrent major depressive disorder, in remission (HCC) 08/02/2007  . Moderate episode of recurrent major depressive disorder (HCC) 08/02/2007  . Diverticulosis of large intestine 11/16/2006  . EXTERNAL HEMORRHOIDS 05/12/2005    Past Medical History:  Diagnosis Date  . Allergy   . Anal fissure 02/2012  . Anxiety   . Arthritis   . Asthma   . Bursitis    right hip  . Complication of anesthesia     woke up during a colonscopy, difficult to wake up post-op sometimes , requires lots of blankets to stay warm post op  . Depression   . Diabetes mellitus without complication (HCC)    type 2 diet controlled  . Fatty liver    sees dr Tova Fresh for  . Fibromyalgia   . Gallstones 02/15/2015  . GERD (gastroesophageal reflux disease)   . Hepatitis A infection 1979  .  Hyperlipidemia   . Hypertension   . Irritable bowel syndrome (IBS)   . 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 Psoriasis    left foot  . Symptomatic bradycardia    a. s/p MDT  MRI compatible dual chamber pacemaker 07/2014 Dr Nunzio Belch  . Tarsal tunnel syndrome of left side     Family History  Problem Relation Age of Onset  . Cancer Mother        liver  . Diabetes Mother   . Hyperlipidemia Mother   . Hypertension Mother   . Stroke Mother   . Heart disease Father   . Anesthesia problems Sister        hard to wake up post-op  . Hyperlipidemia Brother   . HIV/AIDS Brother   . Diabetes Brother   . Cancer Brother        lung  . Cancer Maternal Grandmother        breast  . Cancer Maternal Grandfather        colon  . Cancer Paternal Grandfather        COLON   Past Surgical History:  Procedure Laterality Date  . ABDOMINAL HYSTERECTOMY  09/1994   partial  . APPENDECTOMY    . BILATERAL SALPINGOOPHORECTOMY Bilateral 04/2011  . CARDIAC CATHETERIZATION  10/08/2007  . CESAREAN SECTION     x 1  . CHOLECYSTECTOMY N/A 02/16/2015   Procedure: LAPAROSCOPIC CHOLECYSTECTOMY WITH INTRAOPERATIVE CHOLANGIOGRAM;  Surgeon: Jacolyn Matar, MD;  Location: [facility];  Service: General;  Laterality: N/A;  . COLONOSCOPY WITH PROPOFOL  N/A 08/19/2016   Procedure: COLONOSCOPY WITH PROPOFOL ;  Surgeon: Alvis Jourdain, MD;  Location: [facility];  Service: Endoscopy;  Laterality: N/A;  . COLONOSCOPY WITH PROPOFOL  N/A 10/22/2021   Procedure: COLONOSCOPY WITH PROPOFOL ;  Surgeon: Alvis Jourdain, MD;  Location: [facility];  Service: Gastroenterology;  Laterality: N/A;  . CYSTOSCOPY W/ URETERAL STENT PLACEMENT  08/04/2009  . EXAMINATION UNDER ANESTHESIA  02/17/2012   Procedure: EXAM UNDER ANESTHESIA;  Surgeon: Azucena Bollard, MD;  Location: [facility];  Service: General;  Laterality: N/A;  Exam under anesthesia with lateral internal sphincterotomy  . LAPAROSCOPIC LYSIS INTESTINAL ADHESIONS  08/04/2009  . LEFT HEART CATH AND CORONARY ANGIOGRAPHY N/A 02/10/2017   Procedure: LEFT HEART CATH AND CORONARY ANGIOGRAPHY;  Surgeon: Pasqual Bone, MD;  Location: [facility];  Service:  Cardiovascular;  Laterality: N/A;  . PERMANENT PACEMAKER INSERTION N/A 07/08/2014   MDT MRI compatible dual chamber pacemaker implanted by Dr Nunzio Belch for symptomatic bradycardia  . POLYPECTOMY  10/22/2021   Procedure: POLYPECTOMY;  Surgeon: Alvis Jourdain, MD;  Location: [facility];  Service: Gastroenterology;;  . SHOULDER ARTHROSCOPY W/ ROTATOR CUFF REPAIR Left 1999  . SMALL INTESTINE SURGERY    . SPHINCTEROTOMY  02/17/2012   Procedure: SPHINCTEROTOMY;  Surgeon: Azucena Bollard, MD;  Location: [facility];  Service: General;  Laterality: N/A;   Social History   Social History Narrative   Lives alone.  Daughter and granddaughter live in [place], [place]. Pt exercise sometimes.      Immunization History  Administered Date(s) Administered  . Influenza,inj,Quad PF,6+ Mos 03/10/2015, 04/12/2016, 03/24/2017  . Influenza-Unspecified 03/17/2014  . PFIZER(Purple Top)SARS-COV-2 Vaccination 10/02/2019, 10/23/2019, 05/12/2020  . Pfizer Covid-19 Vaccine Bivalent Booster 17yrs & up 05/23/2021  . Pneumococcal Polysaccharide-23 03/10/2015  . Td 02/16/2012  . Tdap 02/16/2012, 11/03/2021     Objective: Vital Signs: There were no vitals taken for this visit.  Physical Exam Vitals and nursing note reviewed.  Constitutional:      Appearance: She is well-developed.  HENT:     Head: Normocephalic and atraumatic.   Eyes:     Conjunctiva/sclera: Conjunctivae normal.    Cardiovascular:     Rate and Rhythm: Normal rate and regular rhythm.     Heart sounds: Normal heart sounds.  Pulmonary:     Effort: Pulmonary effort is normal.     Breath sounds: Normal breath sounds.  Abdominal:     General: Bowel sounds are normal.     Palpations: Abdomen is soft.   Musculoskeletal:     Cervical back: Normal range of motion.  Lymphadenopathy:     Cervical: No cervical adenopathy.   Skin:    General: Skin is warm and dry.     Capillary Refill: Capillary refill takes less than 2 seconds.    Neurological:     Mental Status: She is alert and oriented to person, place, and time.   Psychiatric:        Behavior: Behavior normal.     Musculoskeletal Exam: ***  CDAI Exam: CDAI Score: -- Patient Global: --; Provider Global: -- Swollen: --; Tender: -- Joint Exam 11/22/2023   No joint exam has been documented for this visit   There is currently no information documented on the homunculus. Go to the Rheumatology activity and complete the homunculus joint exam.  Investigation: No additional findings.  Imaging: No results found.  Recent Labs: Lab Results  Component Value Date   WBC 8.4 07/10/2021   HGB 13.7 07/10/2021   PLT 276 07/10/2021   NA 136 07/10/2021   K 3.5 07/10/2021   CL 103 07/10/2021   CO2 24 07/10/2021   GLUCOSE 135 (H) 07/10/2021   BUN 6 (L) 07/10/2021   CREATININE 0.72 07/10/2021   BILITOT 0.6 07/10/2021   ALKPHOS 44 07/10/2021   AST 28 07/10/2021   ALT 37 07/10/2021   PROT 7.5 07/10/2021   ALBUMIN 4.0 07/10/2021   CALCIUM  8.9 07/10/2021   GFRAA 110 10/10/2019    Speciality Comments: No specialty comments available.  Procedures:  No procedures performed Allergies: Codeine, Propoxyphene n-acetaminophen , Sulfonamide derivatives, Alka-seltzer [aspirin  effervescent], Amlodipine , Amoxicillin -pot clavulanate, Norvasc  [amlodipine  besylate], Red dye #40 (allura red), Singulair  [montelukast  sodium], Sulfa  antibiotics, Sulfur, Bupropion, Buspirone , Latex, and Metformin and related   Assessment / Plan:     Visit Diagnoses: Fibromyalgia  Trapezius muscle spasm  Chronic insomnia  Other fatigue  Trochanteric bursitis of both hips  Chronic pain of right knee  Spondylosis of lumbar spine  Psoriasis  Osteopenia of multiple sites  Vitamin D  deficiency  Hair loss  Diverticulosis of large intestine without hemorrhage  Essential hypertension  History of IBS  History of asthma  Diverticulitis of colon  History of  hypercholesterolemia  History of gastroesophageal reflux (GERD)  Pacemaker  Sick sinus syndrome (HCC)  Anxiety and depression  Orders: No orders of the defined types were placed in this encounter.  No orders of the defined types were placed in this encounter.   Face-to-face time spent with patient was *** minutes. Greater than 50% of time was spent in counseling and coordination of care.  Follow-Up Instructions: No follow-ups on file.   Romayne Clubs, PA-C  Note - This record has been created using Dragon software.  Chart creation errors have been sought, but may not always  have been located. Such creation errors do not reflect on  the standard of medical care.

## 2023-11-22 ENCOUNTER — Ambulatory Visit: Payer: Medicare Other | Admitting: Physician Assistant

## 2023-11-22 DIAGNOSIS — Z8719 Personal history of other diseases of the digestive system: Secondary | ICD-10-CM

## 2023-11-22 DIAGNOSIS — M7061 Trochanteric bursitis, right hip: Secondary | ICD-10-CM

## 2023-11-22 DIAGNOSIS — I495 Sick sinus syndrome: Secondary | ICD-10-CM

## 2023-11-22 DIAGNOSIS — Z950 Presence of cardiac pacemaker: Secondary | ICD-10-CM

## 2023-11-22 DIAGNOSIS — Z8709 Personal history of other diseases of the respiratory system: Secondary | ICD-10-CM

## 2023-11-22 DIAGNOSIS — R5383 Other fatigue: Secondary | ICD-10-CM

## 2023-11-22 DIAGNOSIS — M797 Fibromyalgia: Secondary | ICD-10-CM

## 2023-11-22 DIAGNOSIS — I10 Essential (primary) hypertension: Secondary | ICD-10-CM

## 2023-11-22 DIAGNOSIS — L409 Psoriasis, unspecified: Secondary | ICD-10-CM

## 2023-11-22 DIAGNOSIS — K5730 Diverticulosis of large intestine without perforation or abscess without bleeding: Secondary | ICD-10-CM

## 2023-11-22 DIAGNOSIS — F5104 Psychophysiologic insomnia: Secondary | ICD-10-CM

## 2023-11-22 DIAGNOSIS — F32A Depression, unspecified: Secondary | ICD-10-CM

## 2023-11-22 DIAGNOSIS — M62838 Other muscle spasm: Secondary | ICD-10-CM

## 2023-11-22 DIAGNOSIS — K5732 Diverticulitis of large intestine without perforation or abscess without bleeding: Secondary | ICD-10-CM

## 2023-11-22 DIAGNOSIS — E559 Vitamin D deficiency, unspecified: Secondary | ICD-10-CM

## 2023-11-22 DIAGNOSIS — M47816 Spondylosis without myelopathy or radiculopathy, lumbar region: Secondary | ICD-10-CM

## 2023-11-22 DIAGNOSIS — M8589 Other specified disorders of bone density and structure, multiple sites: Secondary | ICD-10-CM

## 2023-11-22 DIAGNOSIS — L659 Nonscarring hair loss, unspecified: Secondary | ICD-10-CM

## 2023-11-22 DIAGNOSIS — G8929 Other chronic pain: Secondary | ICD-10-CM

## 2023-11-22 DIAGNOSIS — Z8639 Personal history of other endocrine, nutritional and metabolic disease: Secondary | ICD-10-CM

## 2023-11-24 NOTE — Progress Notes (Deleted)
 Office Visit Note  Patient: [name]             Date of Birth: 25-Dec-1955           MRN: [id number]             PCP: Aldine Humphreys, PA Referring: Aldine Humphreys, PA Visit Date: 11/30/2023 Occupation: @GUAROCC @  Subjective:    History of Present Illness: [name] is a 68 y.o. female with history of fibromyalgia.       Activities of Daily Living:  Patient reports morning stiffness for *** {minute/hour:19697}.   Patient {ACTIONS;DENIES/REPORTS:21021675::Denies} nocturnal pain.  Difficulty dressing/grooming: {ACTIONS;DENIES/REPORTS:21021675::Denies} Difficulty climbing stairs: {ACTIONS;DENIES/REPORTS:21021675::Denies} Difficulty getting out of chair: {ACTIONS;DENIES/REPORTS:21021675::Denies} Difficulty using hands for taps, buttons, cutlery, and/or writing: {ACTIONS;DENIES/REPORTS:21021675::Denies}  No Rheumatology ROS completed.   PMFS History:  Patient Active Problem List   Diagnosis Date Noted   Chronic prescription benzodiazepine use 10/20/2021   Chronic prescription opiate use 10/20/2021   Vasomotor instability 10/20/2021   Estrogen deficiency 10/20/2021   Prediabetes 09/03/2021   Dyshidrosis (pompholyx) 11/24/2020   Abdominal pain, LLQ 08/10/2020   Arthritis 08/10/2020   Change in bowel habit 08/10/2020   Colon cancer screening 08/10/2020   Diverticulitis of colon 08/10/2020   Flatulence, eructation and gas pain 08/10/2020   Hemorrhage of colon due to diverticulosis 08/10/2020   Nausea 08/10/2020   Vomiting without nausea 08/10/2020   Irritable bowel syndrome with diarrhea 01/21/2020   Laryngopharyngeal reflux (LPR) 05/24/2018   TMJ (sprain of temporomandibular joint), initial encounter 05/24/2018   Aortic atherosclerosis (HCC) 10/18/2017   Acute coronary syndrome (HCC) 02/09/2017   Right knee pain 07/04/2016   Pain in right ankle and joints of right foot 07/04/2016   Psoriasis 03/10/2015   Right foot pain 11/13/2014   Ethmoid sinusitis  11/13/2014   Abdominal pain, epigastric 11/13/2014   Vitamin D  deficiency 11/13/2014   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   Sick sinus syndrome (HCC) 07/08/2014   Chest pain at rest 07/05/2014   Chronic rhinitis 10/22/2013   Fatty liver 10/22/2013   Interstitial cystitis 10/22/2013   Hip pain, right 10/22/2013   NASH (nonalcoholic steatohepatitis) 10/22/2013   Chronic insomnia 09/25/2012   HTN (hypertension) 09/25/2012   Hyperlipidemia 09/25/2012   Hyperlipidemia associated with type 2 diabetes mellitus (HCC) 09/25/2012   Diabetes mellitus type 2, controlled, without complications (HCC) 05/22/2012   GERD 12/04/2008   History of colonic polyps 12/04/2008   CONSTIPATION 10/21/2008   Fibromyalgia 10/21/2008   LACTOSE INTOLERANCE 11/20/2007   Chest pain, precordial 11/20/2007   Lactose intolerance 11/20/2007   Anxiety and depression 08/02/2007   Osteoporosis 08/02/2007   Recurrent major depressive disorder, in remission (HCC) 08/02/2007   Moderate episode of recurrent major depressive disorder (HCC) 08/02/2007   Diverticulosis of large intestine 11/16/2006   EXTERNAL HEMORRHOIDS 05/12/2005    Past Medical History:  Diagnosis Date   Allergy    Anal fissure 02/2012   Anxiety    Arthritis    Asthma    Bursitis    right hip   Complication of anesthesia     woke up during a colonscopy, difficult to wake up post-op sometimes , requires lots of blankets to stay warm post op   Depression    Diabetes mellitus without complication (HCC)    type 2 diet controlled   Fatty liver    sees dr Tova Fresh for   Fibromyalgia    Gallstones 02/15/2015   GERD (gastroesophageal reflux disease)    Hepatitis A infection 1979  Hyperlipidemia    Hypertension    Irritable bowel syndrome (IBS)    Presence of permanent cardiac pacemaker 07/2014   sees dr allred   Psoriasis    left foot   Symptomatic bradycardia    a. s/p MDT MRI compatible dual chamber pacemaker 07/2014 Dr Allred   Tarsal tunnel syndrome  of left side     Family History  Problem Relation Age of Onset   Cancer Mother        liver   Diabetes Mother    Hyperlipidemia Mother    Hypertension Mother    Stroke Mother    Heart disease Father    Anesthesia problems Sister        hard to wake up post-op   Hyperlipidemia Brother    HIV/AIDS Brother    Diabetes Brother    Cancer Brother        lung   Cancer Maternal Grandmother        breast   Cancer Maternal Grandfather        colon   Cancer Paternal Grandfather        COLON   Past Surgical History:  Procedure Laterality Date   ABDOMINAL HYSTERECTOMY  09/1994   partial   APPENDECTOMY     BILATERAL SALPINGOOPHORECTOMY Bilateral 04/2011   CARDIAC CATHETERIZATION  10/08/2007   CESAREAN SECTION     x 1   CHOLECYSTECTOMY N/A 02/16/2015   Procedure: LAPAROSCOPIC CHOLECYSTECTOMY WITH INTRAOPERATIVE CHOLANGIOGRAM;  Surgeon: Jacolyn Matar, MD;  Location: [facility];  Service: General;  Laterality: N/A;   COLONOSCOPY WITH PROPOFOL  N/A 08/19/2016   Procedure: COLONOSCOPY WITH PROPOFOL ;  Surgeon: Alvis Jourdain, MD;  Location: [facility];  Service: Endoscopy;  Laterality: N/A;   COLONOSCOPY WITH PROPOFOL  N/A 10/22/2021   Procedure: COLONOSCOPY WITH PROPOFOL ;  Surgeon: Alvis Jourdain, MD;  Location: [facility];  Service: Gastroenterology;  Laterality: N/A;   CYSTOSCOPY W/ URETERAL STENT PLACEMENT  08/04/2009   EXAMINATION UNDER ANESTHESIA  02/17/2012   Procedure: EXAM UNDER ANESTHESIA;  Surgeon: Azucena Bollard, MD;  Location: [facility];  Service: General;  Laterality: N/A;  Exam under anesthesia with lateral internal sphincterotomy   LAPAROSCOPIC LYSIS INTESTINAL ADHESIONS  08/04/2009   LEFT HEART CATH AND CORONARY ANGIOGRAPHY N/A 02/10/2017   Procedure: LEFT HEART CATH AND CORONARY ANGIOGRAPHY;  Surgeon: Pasqual Bone, MD;  Location: [facility];  Service: Cardiovascular;  Laterality: N/A;   PERMANENT PACEMAKER INSERTION N/A 07/08/2014   MDT MRI compatible dual chamber  pacemaker implanted by Dr Nunzio Belch for symptomatic bradycardia   POLYPECTOMY  10/22/2021   Procedure: POLYPECTOMY;  Surgeon: Alvis Jourdain, MD;  Location: [facility];  Service: Gastroenterology;;   SHOULDER ARTHROSCOPY W/ ROTATOR CUFF REPAIR Left 1999   SMALL INTESTINE SURGERY     SPHINCTEROTOMY  02/17/2012   Procedure: SPHINCTEROTOMY;  Surgeon: Azucena Bollard, MD;  Location: [facility];  Service: General;  Laterality: N/A;   Social History   Social History Narrative   Lives alone.  Daughter and granddaughter live in [place], [place]. Pt exercise sometimes.      Immunization History  Administered Date(s) Administered   Influenza,inj,Quad PF,6+ Mos 03/10/2015, 04/12/2016, 03/24/2017   Influenza-Unspecified 03/17/2014   PFIZER(Purple Top)SARS-COV-2 Vaccination 10/02/2019, 10/23/2019, 05/12/2020   Pfizer Covid-19 Vaccine Bivalent Booster 42yrs & up 05/23/2021   Pneumococcal Polysaccharide-23 03/10/2015   Td 02/16/2012   Tdap 02/16/2012, 11/03/2021     Objective: Vital Signs: There were no vitals taken for this visit.  Physical Exam Vitals and nursing note reviewed.  Constitutional:      Appearance: She is well-developed.  HENT:     Grant: Normocephalic and atraumatic.   Eyes:     Conjunctiva/sclera: Conjunctivae normal.    Cardiovascular:     Rate and Rhythm: Normal rate and regular rhythm.     Heart sounds: Normal heart sounds.  Pulmonary:     Effort: Pulmonary effort is normal.     Breath sounds: Normal breath sounds.  Abdominal:     General: Bowel sounds are normal.     Palpations: Abdomen is soft.   Musculoskeletal:     Cervical back: Normal range of motion.  Lymphadenopathy:     Cervical: No cervical adenopathy.   Skin:    General: Skin is warm and dry.     Capillary Refill: Capillary refill takes less than 2 seconds.   Neurological:     Mental Status: She is alert and oriented to person, place, and time.   Psychiatric:        Behavior:  Behavior normal.      Musculoskeletal Exam: ***  CDAI Exam: CDAI Score: -- Patient Global: --; Provider Global: -- Swollen: --; Tender: -- Joint Exam 11/30/2023   No joint exam has been documented for this visit   There is currently no information documented on the homunculus. Go to the Rheumatology activity and complete the homunculus joint exam.  Investigation: No additional findings.  Imaging: No results found.  Recent Labs: Lab Results  Component Value Date   WBC 8.4 07/10/2021   HGB 13.7 07/10/2021   PLT 276 07/10/2021   NA 136 07/10/2021   K 3.5 07/10/2021   CL 103 07/10/2021   CO2 24 07/10/2021   GLUCOSE 135 (H) 07/10/2021   BUN 6 (L) 07/10/2021   CREATININE 0.72 07/10/2021   BILITOT 0.6 07/10/2021   ALKPHOS 44 07/10/2021   AST 28 07/10/2021   ALT 37 07/10/2021   PROT 7.5 07/10/2021   ALBUMIN 4.0 07/10/2021   CALCIUM  8.9 07/10/2021   GFRAA 110 10/10/2019    Speciality Comments: No specialty comments available.  Procedures:  No procedures performed Allergies: Codeine, Propoxyphene n-acetaminophen , Sulfonamide derivatives, Alka-seltzer [aspirin  effervescent], Amlodipine , Amoxicillin -pot clavulanate, Norvasc  [amlodipine  besylate], Red dye #40 (allura red), Singulair  [montelukast  sodium], Sulfa  antibiotics, Sulfur, Bupropion, Buspirone , Latex, and Metformin and related   Assessment / Plan:     Visit Diagnoses: Fibromyalgia  Trapezius muscle spasm  Chronic insomnia  Other fatigue  Trochanteric bursitis of both hips  Chronic pain of right knee  Spondylosis of lumbar spine  Psoriasis  Osteopenia of multiple sites  Vitamin D  deficiency  Hair loss  Diverticulosis of large intestine without hemorrhage  Essential hypertension  History of IBS  History of asthma  Diverticulitis of colon  History of hypercholesterolemia  History of gastroesophageal reflux (GERD)  Pacemaker  Sick sinus syndrome (HCC)  Anxiety and  depression  Orders: No orders of the defined types were placed in this encounter.  No orders of the defined types were placed in this encounter.   Face-to-face time spent with patient was *** minutes. Greater than 50% of time was spent in counseling and coordination of care.  Follow-Up Instructions: No follow-ups on file.   Romayne Clubs, PA-C  Note - This record has been created using Dragon software.  Chart creation errors have been sought, but may not always  have been located. Such creation errors do not reflect on  the standard of medical care.

## 2023-11-30 ENCOUNTER — Ambulatory Visit: Admitting: Physician Assistant

## 2023-11-30 DIAGNOSIS — Z8709 Personal history of other diseases of the respiratory system: Secondary | ICD-10-CM

## 2023-11-30 DIAGNOSIS — M8589 Other specified disorders of bone density and structure, multiple sites: Secondary | ICD-10-CM

## 2023-11-30 DIAGNOSIS — Z8639 Personal history of other endocrine, nutritional and metabolic disease: Secondary | ICD-10-CM

## 2023-11-30 DIAGNOSIS — R5383 Other fatigue: Secondary | ICD-10-CM

## 2023-11-30 DIAGNOSIS — I10 Essential (primary) hypertension: Secondary | ICD-10-CM

## 2023-11-30 DIAGNOSIS — Z8719 Personal history of other diseases of the digestive system: Secondary | ICD-10-CM

## 2023-11-30 DIAGNOSIS — G8929 Other chronic pain: Secondary | ICD-10-CM

## 2023-11-30 DIAGNOSIS — E559 Vitamin D deficiency, unspecified: Secondary | ICD-10-CM

## 2023-11-30 DIAGNOSIS — M7061 Trochanteric bursitis, right hip: Secondary | ICD-10-CM

## 2023-11-30 DIAGNOSIS — K5730 Diverticulosis of large intestine without perforation or abscess without bleeding: Secondary | ICD-10-CM

## 2023-11-30 DIAGNOSIS — F419 Anxiety disorder, unspecified: Secondary | ICD-10-CM

## 2023-11-30 DIAGNOSIS — M797 Fibromyalgia: Secondary | ICD-10-CM

## 2023-11-30 DIAGNOSIS — L409 Psoriasis, unspecified: Secondary | ICD-10-CM

## 2023-11-30 DIAGNOSIS — L659 Nonscarring hair loss, unspecified: Secondary | ICD-10-CM

## 2023-11-30 DIAGNOSIS — M47816 Spondylosis without myelopathy or radiculopathy, lumbar region: Secondary | ICD-10-CM

## 2023-11-30 DIAGNOSIS — Z950 Presence of cardiac pacemaker: Secondary | ICD-10-CM

## 2023-11-30 DIAGNOSIS — F32A Depression, unspecified: Secondary | ICD-10-CM

## 2023-11-30 DIAGNOSIS — F5104 Psychophysiologic insomnia: Secondary | ICD-10-CM

## 2023-11-30 DIAGNOSIS — M62838 Other muscle spasm: Secondary | ICD-10-CM

## 2023-11-30 DIAGNOSIS — I495 Sick sinus syndrome: Secondary | ICD-10-CM

## 2023-11-30 DIAGNOSIS — K5732 Diverticulitis of large intestine without perforation or abscess without bleeding: Secondary | ICD-10-CM

## 2023-12-05 ENCOUNTER — Ambulatory Visit (INDEPENDENT_AMBULATORY_CARE_PROVIDER_SITE_OTHER): Payer: Medicare Other

## 2023-12-05 DIAGNOSIS — I495 Sick sinus syndrome: Secondary | ICD-10-CM

## 2023-12-11 LAB — CUP PACEART REMOTE DEVICE CHECK
Battery Remaining Longevity: 16 mo
Battery Voltage: 2.89 V
Brady Statistic AP VP Percent: 0.08 %
Brady Statistic AP VS Percent: 99.85 %
Brady Statistic AS VP Percent: 0 %
Brady Statistic AS VS Percent: 0.08 %
Brady Statistic RA Percent Paced: 99.9 %
Brady Statistic RV Percent Paced: 0.11 %
Date Time Interrogation Session: 20250703161730
Implantable Lead Connection Status: 753985
Implantable Lead Connection Status: 753985
Implantable Lead Implant Date: 20160202
Implantable Lead Implant Date: 20160202
Implantable Lead Location: 753859
Implantable Lead Location: 753860
Implantable Lead Model: 5076
Implantable Lead Model: 5076
Implantable Pulse Generator Implant Date: 20160202
Lead Channel Impedance Value: 418 Ohm
Lead Channel Impedance Value: 456 Ohm
Lead Channel Impedance Value: 494 Ohm
Lead Channel Impedance Value: 551 Ohm
Lead Channel Pacing Threshold Amplitude: 0.375 V
Lead Channel Pacing Threshold Amplitude: 0.875 V
Lead Channel Pacing Threshold Pulse Width: 0.4 ms
Lead Channel Pacing Threshold Pulse Width: 0.4 ms
Lead Channel Sensing Intrinsic Amplitude: 17.125 mV
Lead Channel Sensing Intrinsic Amplitude: 17.125 mV
Lead Channel Sensing Intrinsic Amplitude: 3.5 mV
Lead Channel Sensing Intrinsic Amplitude: 3.5 mV
Lead Channel Setting Pacing Amplitude: 2 V
Lead Channel Setting Pacing Amplitude: 2.5 V
Lead Channel Setting Pacing Pulse Width: 0.4 ms
Lead Channel Setting Sensing Sensitivity: 2.8 mV
Zone Setting Status: 755011
Zone Setting Status: 755011

## 2023-12-16 ENCOUNTER — Ambulatory Visit: Payer: Self-pay | Admitting: Cardiology

## 2023-12-27 ENCOUNTER — Ambulatory Visit: Admitting: Podiatry

## 2024-01-10 ENCOUNTER — Ambulatory Visit (INDEPENDENT_AMBULATORY_CARE_PROVIDER_SITE_OTHER): Admitting: Podiatry

## 2024-01-10 DIAGNOSIS — Z91198 Patient's noncompliance with other medical treatment and regimen for other reason: Secondary | ICD-10-CM

## 2024-01-10 NOTE — Progress Notes (Signed)
 1. Failure to attend appointment with reason given    Appointment rescheduled by patient.

## 2024-01-29 ENCOUNTER — Ambulatory Visit (INDEPENDENT_AMBULATORY_CARE_PROVIDER_SITE_OTHER): Admitting: Podiatry

## 2024-01-29 ENCOUNTER — Encounter: Payer: Self-pay | Admitting: Podiatry

## 2024-01-29 DIAGNOSIS — M79675 Pain in left toe(s): Secondary | ICD-10-CM

## 2024-01-29 DIAGNOSIS — M79674 Pain in right toe(s): Secondary | ICD-10-CM | POA: Diagnosis not present

## 2024-01-29 DIAGNOSIS — B351 Tinea unguium: Secondary | ICD-10-CM | POA: Diagnosis not present

## 2024-01-29 DIAGNOSIS — Q828 Other specified congenital malformations of skin: Secondary | ICD-10-CM

## 2024-01-29 DIAGNOSIS — E119 Type 2 diabetes mellitus without complications: Secondary | ICD-10-CM | POA: Diagnosis not present

## 2024-02-01 ENCOUNTER — Encounter: Payer: Self-pay | Admitting: Podiatry

## 2024-02-01 NOTE — Progress Notes (Signed)
 Subjective:  Patient ID: [name], female    DOB: 1956/03/17,  MRN: [id number]  [name] presents to clinic today for preventative diabetic foot care and painful porokeratotic lesion(s) left foot and painful mycotic toenails that limit ambulation. Painful toenails interfere with ambulation. Aggravating factors include wearing enclosed shoe gear. Pain is relieved with periodic professional debridement. Painful porokeratotic lesions are aggravated when weightbearing with and without shoegear. Pain is relieved with periodic professional debridement.  Chief Complaint  Patient presents with   [facility]    Rm2 Diabetic foot care/ Dr. Bernita Ned last visit June 2025/ A1C5.6   New problem(s): None.   PCP is Ned Bernita, PA.  Allergies  Allergen Reactions   Codeine Other (See Comments)    HALLUCINATIONS    Propoxyphene N-Acetaminophen  Other (See Comments)    HALLUCINATIONS   Sulfonamide Derivatives Hives, Itching and Swelling   Alka-Seltzer [Aspirin  Effervescent] Other (See Comments)    like she's fading away, jittery    Amlodipine      In high doses causes stomach issues    Amoxicillin -Pot Clavulanate     In high doses causes stomach issues    Norvasc  [Amlodipine  Besylate] Other (See Comments)    passed out   Red Dye #40 (Allura Red) Other (See Comments)    burning and tingling sensation    Singulair  [Montelukast  Sodium] Other (See Comments)    Increased depression, sadness   Sulfa  Antibiotics Hives, Itching and Swelling   Sulfur Hives, Itching and Swelling   Bupropion Anxiety and Other (See Comments)    headache   Buspirone  Other (See Comments)    Abdominal bloating and increased intestinal gas   Latex Hives and Itching   Metformin And Related Diarrhea    Other Reaction(s): Abdominal Pain    Review of Systems: Negative except as noted in the HPI.  Objective: No changes noted in today's physical examination. There were no vitals filed for this visit. [name] is a pleasant 68 y.o. female WD, WN in NAD. AAO x 3.  Vascular Examination: CFT immediate b/l LE. Palpable DP/PT pulses b/l LE. Digital hair present b/l. Skin temperature gradient WNL b/l. No pain with calf compression b/l. No edema noted b/l. No cyanosis or clubbing noted b/l LE.  Dermatological Examination: No open wounds b/l LE. No interdigital macerations noted b/l LE. Toenails 1-5 b/l are painful, elongated, discolored, dystrophic with subungual debris. Pain with dorsal palpation of nailplates. No erythema, no edema, no drainage noted.   Porokeratotic lesion(s) left forefoot submet head 2 tender to palpation.  No erythema, no edema, no drainage, no fluctuance.  Scaly patches consistent with psoriasis.  Neurological Examination: Pt has subjective symptoms of neuropathy. Protective sensation intact 5/5 intact bilaterally with 10g monofilament b/l.  Musculoskeletal Examination: Muscle strength 5/5 to all lower extremity muscle groups bilaterally. HAV with bunion deformity noted b/l LE.  Assessment/Plan: 1. Pain due to onychomycosis of toenails of both feet   2. Porokeratosis   3. Controlled type 2 diabetes mellitus without complication, without long-term current use of insulin  [facility])     Patient was evaluated and treated. All patient's and/or POA's questions/concerns addressed on today's visit. Toenails 1-5 debrided in length and girth without incident. Porokeratotic lesion(s) submet head 2 left foot pared with sharp debridement without incident. Continue daily foot inspections and monitor blood glucose per PCP/Endocrinologist's recommendations. Continue soft, supportive shoe gear daily. Report any pedal injuries to medical professional. Call office if there are any questions/concerns.  Return in about  9 weeks (around 04/01/2024).  Delon LITTIE Merlin, DPM      [place] LOCATION: [address], [place] [address]                    Office [phone]   [facility] LOCATION: [address] [place], [place] [address] Office [phone]

## 2024-02-25 ENCOUNTER — Emergency Department (HOSPITAL_BASED_OUTPATIENT_CLINIC_OR_DEPARTMENT_OTHER): Admission: EM | Admit: 2024-02-25 | Discharge: 2024-02-25 | Disposition: A | Discharge status: Home / Self Care

## 2024-02-25 ENCOUNTER — Encounter (HOSPITAL_BASED_OUTPATIENT_CLINIC_OR_DEPARTMENT_OTHER): Payer: Self-pay | Admitting: Emergency Medicine

## 2024-02-25 ENCOUNTER — Emergency Department (HOSPITAL_BASED_OUTPATIENT_CLINIC_OR_DEPARTMENT_OTHER)

## 2024-02-25 DIAGNOSIS — R5383 Other fatigue: Secondary | ICD-10-CM | POA: Insufficient documentation

## 2024-02-25 DIAGNOSIS — H538 Other visual disturbances: Secondary | ICD-10-CM | POA: Insufficient documentation

## 2024-02-25 DIAGNOSIS — I10 Essential (primary) hypertension: Secondary | ICD-10-CM | POA: Diagnosis not present

## 2024-02-25 DIAGNOSIS — M797 Fibromyalgia: Secondary | ICD-10-CM | POA: Insufficient documentation

## 2024-02-25 DIAGNOSIS — Z91040 Latex allergy status: Secondary | ICD-10-CM | POA: Insufficient documentation

## 2024-02-25 DIAGNOSIS — Z7982 Long term (current) use of aspirin: Secondary | ICD-10-CM | POA: Insufficient documentation

## 2024-02-25 DIAGNOSIS — R202 Paresthesia of skin: Secondary | ICD-10-CM | POA: Diagnosis not present

## 2024-02-25 DIAGNOSIS — E119 Type 2 diabetes mellitus without complications: Secondary | ICD-10-CM | POA: Insufficient documentation

## 2024-02-25 DIAGNOSIS — I495 Sick sinus syndrome: Secondary | ICD-10-CM | POA: Diagnosis not present

## 2024-02-25 DIAGNOSIS — G319 Degenerative disease of nervous system, unspecified: Secondary | ICD-10-CM | POA: Insufficient documentation

## 2024-02-25 DIAGNOSIS — Z79899 Other long term (current) drug therapy: Secondary | ICD-10-CM | POA: Insufficient documentation

## 2024-02-25 DIAGNOSIS — Z7984 Long term (current) use of oral hypoglycemic drugs: Secondary | ICD-10-CM | POA: Insufficient documentation

## 2024-02-25 DIAGNOSIS — R519 Headache, unspecified: Secondary | ICD-10-CM | POA: Diagnosis not present

## 2024-02-25 DIAGNOSIS — R059 Cough, unspecified: Secondary | ICD-10-CM | POA: Insufficient documentation

## 2024-02-25 LAB — COMPREHENSIVE METABOLIC PANEL WITH GFR
ALT: 54 U/L — ABNORMAL HIGH (ref 0–44)
AST: 32 U/L (ref 15–41)
Albumin: 4.9 g/dL (ref 3.5–5.0)
Alkaline Phosphatase: 73 U/L (ref 38–126)
Anion gap: 14 (ref 5–15)
BUN: 9 mg/dL (ref 8–23)
CO2: 22 mmol/L (ref 22–32)
Calcium: 9.8 mg/dL (ref 8.9–10.3)
Chloride: 104 mmol/L (ref 98–111)
Creatinine, Ser: 0.74 mg/dL (ref 0.44–1.00)
GFR, Estimated: >60 mL/min (ref >60)
Glucose, Bld: 109 mg/dL — ABNORMAL HIGH (ref 70–99)
Potassium: 4 mmol/L (ref 3.5–5.1)
Sodium: 140 mmol/L (ref 135–145)
Total Bilirubin: 1 mg/dL (ref 0.0–1.2)
Total Protein: 8.1 g/dL (ref 6.5–8.1)

## 2024-02-25 LAB — CBC WITH DIFFERENTIAL/PLATELET
Abs Immature Granulocytes: 0.02 K/uL (ref 0.00–0.07)
Basophils Absolute: 0 K/uL (ref 0.0–0.1)
Basophils Relative: 1 %
Eosinophils Absolute: 0 K/uL (ref 0.0–0.5)
Eosinophils Relative: 0 %
HCT: 41.4 % (ref 36.0–46.0)
Hemoglobin: 13.4 g/dL (ref 12.0–15.0)
Immature Granulocytes: 0 %
Lymphocytes Relative: 33 %
Lymphs Abs: 2.6 K/uL (ref 0.7–4.0)
MCH: 26.8 pg (ref 26.0–34.0)
MCHC: 32.4 g/dL (ref 30.0–36.0)
MCV: 82.8 fL (ref 80.0–100.0)
Monocytes Absolute: 0.7 K/uL (ref 0.1–1.0)
Monocytes Relative: 9 %
Neutro Abs: 4.4 K/uL (ref 1.7–7.7)
Neutrophils Relative %: 57 %
Platelets: 303 K/uL (ref 150–400)
RBC: 5 MIL/uL (ref 3.87–5.11)
RDW: 13.3 % (ref 11.5–15.5)
WBC: 7.8 K/uL (ref 4.0–10.5)
nRBC: 0 % (ref 0.0–0.2)

## 2024-02-25 LAB — PRO BRAIN NATRIURETIC PEPTIDE: Pro Brain Natriuretic Peptide: <50 pg/mL (ref <300.0)

## 2024-02-25 LAB — TROPONIN T, HIGH SENSITIVITY: Troponin T High Sensitivity: <15 ng/L (ref 0–19)

## 2024-02-25 MED ORDER — HYDRALAZINE HCL 25 MG PO TABS: 50.0000 mg | ORAL_TABLET | Freq: Two times a day (BID) | ORAL | 0 refills | Status: AC | PRN

## 2024-02-25 NOTE — ED Triage Notes (Signed)
 Pt c/o elevated BP all week; sts she has decreased energy and an episode of blurry vision and tingling in LT fingers yesterday; takes medications as directed

## 2024-02-25 NOTE — ED Provider Notes (Signed)
 [place] EMERGENCY DEPARTMENT AT [facility] Provider Note   CSN: [id number] Arrival date & time: 02/25/24  1232     Patient presents with: Hypertension   [name] is a 68 y.o. female. Patient with past history  significant for HTN, type 2 diabetes, GERD, fibromyalgia, sick sinus syndrome who presents to the emergency department with concerns of hypertension and fatigue.  Reports she has had elevated blood pressures for the last week and feelings of fatigue for last 2 weeks.  Also endorses a dry cough but denies any recent fever, chills or bodyaches.  She briefly developed a feeling of tingling in the left hand with associated blurry vision yesterday.  She states that she has been compliant with her home blood pressure medications.  Patient reportedly takes metoprolol  and hydralazine .   Hypertension       Prior to Admission medications   Medication Sig Start Date End Date Taking? Authorizing Provider  hydrALAZINE  (APRESOLINE ) 25 MG tablet Take 2 tablets (50 mg total) by mouth 2 (two) times daily as needed (If blood pressure remaining over 140/90). 02/25/24 03/26/24 Yes Vinetta Brach A, PA-C  ACCU-CHEK GUIDE test strip  02/25/20   [provider]  albuterol  (VENTOLIN  HFA) 108 (90 Base) MCG/ACT inhaler Inhale 2 puffs into the lungs every 4 (four) hours as needed for wheezing. 03/18/21   [provider]  ALPRAZolam  (XANAX ) 0.5 MG tablet Take 0.5 mg by mouth at bedtime as needed for sleep. 08/16/17   [provider]  aspirin  81 MG chewable tablet Chew 81 mg by mouth once a week.    [provider]  atorvastatin  (LIPITOR) 10 MG tablet Take 1 tablet by mouth daily. 08/24/21   [provider]  azelastine  (ASTELIN ) 0.1 % nasal spray Place 2 sprays into both nostrils 2 (two) times daily. Use in each nostril as directed Patient taking differently: Place 2 sprays into both nostrils 2 (two) times daily as needed for allergies. Use in each  nostril as directed 10/18/17   Juliane Che, PA  Azilsartan Medoxomil (EDARBI) 40 MG TABS Take 40 mg by mouth daily.     [provider]  Blood Glucose Monitoring Suppl (ACCU-CHEK GUIDE) w/Device KIT USE DAILY AS DIRECTED 05/17/17   Juliane Che, PA  cetirizine  (ZYRTEC ) 10 MG tablet Take 1 tablet by mouth daily.    [provider]  Cholecalciferol 50 MCG (2000 UT) CAPS Take 1 tablet by mouth daily. 11/22/21   [provider]  ciprofloxacin  (CIPRO ) 500 MG tablet Take 1 tablet (500 mg total) by mouth every 12 (twelve) hours. 07/10/21   Haviland, Julie, MD  CLENPIQ 10-3.5-12 MG-GM -GM/160ML SOLN  10/06/21   [provider]  clobetasol ointment (TEMOVATE) 0.05 % Apply 1 application  topically as needed. 05/13/20   [provider]  cyclobenzaprine  (FLEXERIL ) 10 MG tablet Take 10 mg by mouth 3 (three) times daily as needed for muscle spasms.     [provider]  Dextromethorphan HBr (SCOT-TUSSIN DIABETES) 10 MG/5ML LIQD Take 10 mg by mouth daily as needed (cough).    [provider]  diazepam  (VALIUM ) 5 MG tablet Take by mouth as needed. 11/26/15   [provider]  dicyclomine  (BENTYL ) 10 MG capsule Take 1 capsule 3 times a day by oral route.    [provider]  diphenoxylate-atropine (LOMOTIL) 2.5-0.025 MG tablet Take 2 tablets as needed by oral route.    [provider]  docusate sodium  (COLACE) 100 MG capsule TAKE  1 CAPSULE (100 MG TOTAL) BY MOUTH DAILY AS NEEDED FOR MILD CONSTIPATION. 10/23/14   Juliane Che, PA  doxycycline  (VIBRA -TABS) 100 MG tablet     [provider]  ezetimibe  (ZETIA ) 10 MG tablet Take 1 tablet (10 mg total) by mouth every evening. 10/18/17   Juliane Che, PA  FLOVENT  HFA 110 MCG/ACT inhaler Inhale 2 puffs into the lungs 2 (two) times daily as needed (shortness of breath). 08/29/17   [provider]  fluticasone  (FLONASE ) 50 MCG/ACT nasal spray Place 2 sprays into both  nostrils daily. Patient taking differently: Place 2 sprays into both nostrils as needed for allergies. 10/18/17   Juliane Che, PA  hydrALAZINE  (APRESOLINE ) 25 MG tablet Take 25 mg by mouth as needed. 12/13/18   [provider]  hydrocortisone  (ANUSOL -HC) 2.5 % rectal cream Place rectally. 01/01/19   [provider]  hydrocortisone  2.5 % cream Apply 1 application. topically daily as needed (hemorrhoids). 01/01/19   [provider]  JANUVIA 50 MG tablet Take 50 mg by mouth daily.    [provider]  loperamide (IMODIUM A-D) 2 MG tablet Take 1-2 mg by mouth 4 (four) times daily as needed for diarrhea or loose stools.    [provider]  LOTEMAX SM 0.38 % GEL Place 1 drop into both eyes as needed (redness). 10/18/17   [provider]  meloxicam  (MOBIC ) 15 MG tablet TAKE 1 TABLET BY MOUTH EVERY DAY Patient taking differently: Take 15 mg by mouth daily as needed for pain. 02/24/16   Juliane Che, PA  metoprolol  tartrate (LOPRESSOR ) 25 MG tablet Take 12.5 mg by mouth daily. 01/01/22   [provider]  metroNIDAZOLE  (FLAGYL ) 500 MG tablet Take 1 tablet (500 mg total) by mouth 2 (two) times daily. 07/10/21   Dean Clarity, MD  Misc. Devices MISC Blood pressure monitor Length of need:  99 months 06/22/20   [provider]  Multiple Vitamin (MULTI-VITAMIN DAILY PO) Take 1 tablet by mouth daily.    [provider]  naloxone [facility]) nasal spray 4 mg/0.1 mL Place 1 spray into the nose as needed (opioid overdose).    [provider]  nitroGLYCERIN  (NITROSTAT ) 0.3 MG SL tablet Place 0.3 mg under the tongue every 5 (five) minutes as needed.    [provider]  nitroGLYCERIN  (NITROSTAT ) 0.4 MG SL tablet     [provider]  olopatadine  (PATANOL) 0.1 % ophthalmic solution Place 1 drop into both eyes 2 (two) times daily. Patient taking differently: Place 1 drop into both eyes 2 (two) times daily as needed for  allergies. 10/18/17   Juliane Che, PA  ondansetron  (ZOFRAN -ODT) 4 MG disintegrating tablet Take 1 tablet (4 mg total) by mouth every 8 (eight) hours as needed for nausea or vomiting. 07/10/21   Haviland, Julie, MD  oxyCODONE -acetaminophen  (PERCOCET) 10-325 MG tablet Take 1 tablet by mouth 3 (three) times daily as needed. 12/08/21   [provider]  Peppermint Oil (IBGARD PO) Take 1 Dose by mouth as needed (upset stomach).    [provider]  Probiotic Product (PROBIOTIC PO) Take 1 capsule by mouth daily as needed (upset stomach).    [provider]  venlafaxine XR (EFFEXOR-XR) 150 MG 24 hr capsule Take 150 mg by mouth daily. 11/24/21   [provider]  zolpidem  (AMBIEN ) 10 MG tablet Take 10 mg by mouth at bedtime as needed for sleep.  09/28/13   [provider]    Allergies: Codeine, Propoxyphene n-acetaminophen , Sulfonamide derivatives,  Alka-seltzer [aspirin  effervescent], Amlodipine , Amoxicillin -pot clavulanate, Norvasc  [amlodipine  besylate], Red dye #40 (allura red), Singulair  [montelukast  sodium], Sulfa  antibiotics, Sulfur, Bupropion, Buspirone , Latex, and Metformin and related    Review of Systems  All other systems reviewed and are negative.   Updated Vital Signs BP (!) 152/90   Pulse (!) 59   Temp 98.4 F (36.9 C) (Oral)   Resp 16   Ht 5' 7 (1.702 m)   Wt 75.8 kg   SpO2 97%   BMI 26.16 kg/m   Physical Exam Vitals and nursing note reviewed.  Constitutional:      General: She is not in acute distress.    Appearance: She is well-developed.  HENT:     Head: Normocephalic and atraumatic.  Eyes:     Conjunctiva/sclera: Conjunctivae normal.  Cardiovascular:     Rate and Rhythm: Normal rate and regular rhythm.     Heart sounds: No murmur heard. Pulmonary:     Effort: Pulmonary effort is normal. No respiratory distress.     Breath sounds: Normal breath sounds.  Abdominal:     Palpations: Abdomen is soft.     Tenderness: There is no  abdominal tenderness.  Musculoskeletal:        General: No swelling.     Cervical back: Neck supple.  Skin:    General: Skin is warm and dry.     Capillary Refill: Capillary refill takes less than 2 seconds.  Neurological:     Mental Status: She is alert.  Psychiatric:        Mood and Affect: Mood normal.     (all labs ordered are listed, but only abnormal results are displayed) Labs Reviewed  COMPREHENSIVE METABOLIC PANEL WITH GFR - Abnormal; Notable for the following components:      Result Value   Glucose, Bld 109 (*)    ALT 54 (*)    All other components within normal limits  CBC WITH DIFFERENTIAL/PLATELET  PRO BRAIN NATRIURETIC PEPTIDE  TROPONIN T, HIGH SENSITIVITY  TROPONIN T, HIGH SENSITIVITY    EKG: EKG Interpretation Date/Time:  Sunday February 25 2024 13:05:27 EDT Ventricular Rate:  63 PR Interval:  91 QRS Duration:  100 QT Interval:  403 QTC Calculation: 413 R Axis:   39  Text Interpretation: Sinus rhythm Short PR interval Nonspecific ST abnormality Confirmed by Ula Barter [id number]) on 02/25/2024 1:09:42 PM  Radiology: CT Head Wo Contrast Result Date: 02/25/2024 CLINICAL DATA:  Pleuritic vision. Left upper extremity tingling. Headache and hypertension. EXAM: CT HEAD WITHOUT CONTRAST TECHNIQUE: Contiguous axial images were obtained from the base of the skull through the vertex without intravenous contrast. RADIATION DOSE REDUCTION: This exam was performed according to the departmental dose-optimization program which includes automated exposure control, adjustment of the mA and/or kV according to patient size and/or use of iterative reconstruction technique. COMPARISON:  None Available. FINDINGS: Brain: No evidence of intracranial hemorrhage, acute infarction, hydrocephalus, extra-axial collection, or mass lesion/mass effect. Mild diffuse cerebral atrophy noted. Vascular:  No hyperdense vessel or other acute findings. Skull: No evidence of fracture or other  significant bone abnormality. Sinuses/Orbits:  No acute findings. Other: None. IMPRESSION: No acute intracranial abnormality. Mild cerebral atrophy. Electronically Signed   By: Norleen DELENA Kil M.D.   On: 02/25/2024 15:06   DG Chest 2 View Result Date: 02/25/2024 CLINICAL DATA:  Fatigue, chest pain. EXAM: CHEST - 2 VIEW COMPARISON:  02/17/2021. FINDINGS: Bilateral lung fields are clear. Bilateral costophrenic angles are clear. Normal cardio-mediastinal silhouette. There is a left sided  2-lead pacemaker. No acute osseous abnormalities. The soft tissues are within normal limits. There are surgical clips in the right upper quadrant, typical of a previous cholecystectomy. IMPRESSION: No active cardiopulmonary disease. Electronically Signed   By: Ree Molt M.D.   On: 02/25/2024 15:02     Procedures   Medications Ordered in the ED - No data to display                                  Medical Decision Making Amount and/or Complexity of Data Reviewed Labs: ordered. Radiology: ordered.   This patient presents to the ED for concern of HTN, this involves an extensive number of treatment options, and is a complaint that carries with it a high risk of complications and morbidity.  The differential diagnosis includes uncontrolled HTN, hypertensive emergency, dehydration, anemia   Co morbidities that complicate the patient evaluation  HTN, T2DM, GERD, fibromyalgia, sick sinus syndrome   Lab Tests:  I Ordered, and personally interpreted labs.  The pertinent results include: CBC unremarkable, CMP unremarkable, troponin T negative at less than 15, proBNP negative at less than 50   Imaging Studies ordered:  I ordered imaging studies including chest x-ray, CT head I independently visualized and interpreted imaging which showed chest x-ray and CT head negative for any acute findings I agree with the radiologist interpretation   Cardiac Monitoring: / EKG:  The patient was maintained on a cardiac  monitor.  I personally viewed and interpreted the cardiac monitored which showed an underlying rhythm of: sinus rhythm   Consultations Obtained:  I requested consultation with none,  and discussed lab and imaging findings as well as pertinent plan - they recommend: N/A   Problem List / ED Course / Critical interventions / Medication management  Patient presents to the emergency department today with concerns of hypertension and fatigue.  Past history significant for hypertension, type 2 diabetes, GERD, fibromyalgia, and sick sinus syndrome.  She states that she has been feeling increased fatigue for the last 2 weeks and began noticing that her blood pressure was staying somewhat elevated for the last week or so.  She currently takes metoprolol , hydralazine , and Edarbi for blood pressure control.  Denies any recent changes in medications.  She does endorse that she briefly had a feeling of tingling in the left hand yesterday that went away rather quickly.  No reported chest pain shortness of breath or leg swelling. Physical exam is reassuring with no abnormal findings seen.  No appreciable lower extremity swelling or edema, abdominal pain, or other abnormal findings. Lab workup is reassuring without any elevations in troponin or BNP.  Basic labs unremarkable no signs of dehydration, anemia, or electrolyte disturbance.  Unclear cause of patient's fatigue.  She does report that she will typically have some increased fatigue but she feels that her fibromyalgia has flared up.  I did have a discussion with her regarding her tingling that she felt yesterday that the more fully evaluated with an MRI although we are unable to perform this here in our department today.  I did offer transfer to another department for MR imaging today but patient preferred to hold off on this as she states that she is back to her baseline and has no other concerns at this time. With reassuring workup and blood pressure being  somewhat stable and asymptomatic, advised continuation of her home blood pressure medications and will not make any  significant changes but did advise possibly increasing her hydralazine  dose from 25 mg twice a day to 50 mg twice a day as needed if blood pressure remaining above 140/90.  Encourage close follow-up with primary care provider and cardiology.  She is otherwise stable at this time for outpatient follow-up and discharged home. I have reviewed the patients home medicines and have made adjustments as needed   Social Determinants of Health:  None   Test / Admission - Considered:  Admission considered but patient stable for outpatient follow-up.  Final diagnoses:  Hypertension, unspecified type  Fatigue, unspecified type    ED Discharge Orders          Ordered    hydrALAZINE  (APRESOLINE ) 25 MG tablet  2 times daily PRN        02/25/24 1542               Cassandr Cederberg A, PA-C 02/25/24 1548    Zackowski, Scott, MD 02/25/24 [phone]

## 2024-02-25 NOTE — Discharge Instructions (Signed)
 You were seen in the emergency department today for concerns of hypertension.  Your blood pressure was still somewhat elevated while here in the emergency department. Your labs and imaging were thankfully reassuring with no obvious abnormalities seen to explain your fatigue. I would recommend following up closely with your primary care provider for further evaluation.

## 2024-03-05 ENCOUNTER — Ambulatory Visit: Payer: Medicare Other

## 2024-03-05 DIAGNOSIS — I495 Sick sinus syndrome: Secondary | ICD-10-CM | POA: Diagnosis not present

## 2024-03-06 LAB — CUP PACEART REMOTE DEVICE CHECK
Battery Remaining Longevity: 11 mo
Battery Voltage: 2.88 V
Brady Statistic AP VP Percent: 0.06 %
Brady Statistic AP VS Percent: 99.72 %
Brady Statistic AS VP Percent: 0 %
Brady Statistic AS VS Percent: 0.22 %
Brady Statistic RA Percent Paced: 99.76 %
Brady Statistic RV Percent Paced: 0.08 %
Date Time Interrogation Session: 20251001103106
Implantable Lead Connection Status: 753985
Implantable Lead Connection Status: 753985
Implantable Lead Implant Date: 20160202
Implantable Lead Implant Date: 20160202
Implantable Lead Location: 753859
Implantable Lead Location: 753860
Implantable Lead Model: 5076
Implantable Lead Model: 5076
Implantable Pulse Generator Implant Date: 20160202
Lead Channel Impedance Value: 418 Ohm
Lead Channel Impedance Value: 437 Ohm
Lead Channel Impedance Value: 456 Ohm
Lead Channel Impedance Value: 532 Ohm
Lead Channel Pacing Threshold Amplitude: 0.375 V
Lead Channel Pacing Threshold Amplitude: 1.125 V
Lead Channel Pacing Threshold Pulse Width: 0.4 ms
Lead Channel Pacing Threshold Pulse Width: 0.4 ms
Lead Channel Sensing Intrinsic Amplitude: 16.5 mV
Lead Channel Sensing Intrinsic Amplitude: 16.5 mV
Lead Channel Sensing Intrinsic Amplitude: 2.5 mV
Lead Channel Sensing Intrinsic Amplitude: 2.5 mV
Lead Channel Setting Pacing Amplitude: 2 V
Lead Channel Setting Pacing Amplitude: 2.5 V
Lead Channel Setting Pacing Pulse Width: 0.4 ms
Lead Channel Setting Sensing Sensitivity: 2.8 mV
Zone Setting Status: 755011
Zone Setting Status: 755011

## 2024-03-07 NOTE — Progress Notes (Signed)
 Remote PPM Transmission

## 2024-03-08 ENCOUNTER — Ambulatory Visit: Payer: Self-pay | Admitting: Cardiology

## 2024-03-14 NOTE — Progress Notes (Signed)
 Remote PPM Transmission

## 2024-04-17 ENCOUNTER — Encounter: Payer: Self-pay | Admitting: Podiatry

## 2024-04-17 ENCOUNTER — Ambulatory Visit: Admitting: Podiatry

## 2024-04-17 DIAGNOSIS — E119 Type 2 diabetes mellitus without complications: Secondary | ICD-10-CM

## 2024-04-17 DIAGNOSIS — M79674 Pain in right toe(s): Secondary | ICD-10-CM

## 2024-04-17 DIAGNOSIS — Q828 Other specified congenital malformations of skin: Secondary | ICD-10-CM

## 2024-04-17 DIAGNOSIS — M79675 Pain in left toe(s): Secondary | ICD-10-CM | POA: Diagnosis not present

## 2024-04-17 DIAGNOSIS — B351 Tinea unguium: Secondary | ICD-10-CM | POA: Diagnosis not present

## 2024-04-27 NOTE — Progress Notes (Signed)
 Subjective:  Patient ID: [name], female    DOB: 12-Mar-1956,  MRN: [id number]  [name] presents to clinic today for preventative diabetic foot care and painful porokeratotic lesion(s) left lower extremity and painful mycotic toenails that limit ambulation. Painful toenails interfere with ambulation. Aggravating factors include wearing enclosed shoe gear. Pain is relieved with periodic professional debridement. Painful porokeratotic lesions are aggravated when weightbearing with and without shoegear. Pain is relieved with periodic professional debridement.   New problem(s): None.   PCP is Juliane Che, PA. LOV 02/21/2024.  Allergies  Allergen Reactions   Codeine Other (See Comments)    HALLUCINATIONS    Propoxyphene N-Acetaminophen  Other (See Comments)    HALLUCINATIONS   Sulfonamide Derivatives Hives, Itching and Swelling   Alka-Seltzer [Aspirin  Effervescent] Other (See Comments)    like she's fading away, jittery    Amlodipine      In high doses causes stomach issues    Amoxicillin -Pot Clavulanate     In high doses causes stomach issues    Norvasc  [Amlodipine  Besylate] Other (See Comments)    passed out   Red Dye #40 (Allura Red) Other (See Comments)    burning and tingling sensation    Singulair  [Montelukast  Sodium] Other (See Comments)    Increased depression, sadness   Sulfa  Antibiotics Hives, Itching and Swelling   Sulfur Hives, Itching and Swelling   Bupropion Anxiety and Other (See Comments)    headache   Buspirone  Other (See Comments)    Abdominal bloating and increased intestinal gas   Latex Hives and Itching   Metformin And Related Diarrhea    Other Reaction(s): Abdominal Pain    Review of Systems: Negative except as noted in the HPI.  Objective: No changes noted in today's physical examination. There were no vitals filed for this visit. [name] is a pleasant 68 y.o. female WD, WN in NAD. AAO x 3.  Vascular Examination: CFT immediate b/l  LE. Palpable DP/PT pulses b/l LE. Digital hair present b/l. Skin temperature gradient WNL b/l. No pain with calf compression b/l. No edema noted b/l. No cyanosis or clubbing noted b/l LE.  Dermatological Examination: No open wounds b/l LE. No interdigital macerations noted b/l LE. Toenails 1-5 b/l are painful, elongated, discolored, dystrophic with subungual debris. Pain with dorsal palpation of nailplates. No erythema, no edema, no drainage noted.   Porokeratotic lesion(s) left forefoot submet head 2 tender to palpation.  No erythema, no edema, no drainage, no fluctuance.  Scaly patches consistent with psoriasis.  Neurological Examination: Pt has subjective symptoms of neuropathy. Protective sensation intact 5/5 intact bilaterally with 10g monofilament b/l.  Musculoskeletal Examination: Muscle strength 5/5 to all lower extremity muscle groups bilaterally. HAV with bunion deformity noted b/l LE.  Assessment/Plan: 1. Pain due to onychomycosis of toenails of both feet   2. Porokeratosis   3. Controlled type 2 diabetes mellitus without complication, without long-term current use of insulin  [facility])   Consent given for treatment. Patient examined. All patient's and/or POA's questions/concerns addressed on today's visit. Toenails 1-5 b/l debrided in length and girth without incident. Porokeratotic lesion(s) submet head 2 left foot pared and enucleated with sharp debridement without incident. Treatment was provided by assistant [name] under my supervision. Continue foot and shoe inspections daily. Monitor blood glucose per PCP/Endocrinologist's recommendations.Continue soft, supportive shoe gear daily. Report any pedal injuries to medical professional. Call office if there are any questions/concerns. Return in about 3 months (around 07/18/2024).  Delon LITTIE Merlin, DPM  [place] LOCATION: [address] [place] [address]                    Office [phone]   [facility] [place] LOCATION: [address] [place], [place] [address] Office [phone]

## 2024-05-12 ENCOUNTER — Other Ambulatory Visit: Payer: Self-pay

## 2024-05-12 ENCOUNTER — Emergency Department (HOSPITAL_BASED_OUTPATIENT_CLINIC_OR_DEPARTMENT_OTHER)
Admission: EM | Admit: 2024-05-12 | Discharge: 2024-05-12 | Disposition: A | Discharge status: Home / Self Care | Attending: Emergency Medicine | Admitting: Emergency Medicine

## 2024-05-12 ENCOUNTER — Encounter (HOSPITAL_BASED_OUTPATIENT_CLINIC_OR_DEPARTMENT_OTHER): Payer: Self-pay

## 2024-05-12 DIAGNOSIS — Z8719 Personal history of other diseases of the digestive system: Secondary | ICD-10-CM

## 2024-05-12 DIAGNOSIS — R1032 Left lower quadrant pain: Secondary | ICD-10-CM

## 2024-05-12 LAB — URINALYSIS, ROUTINE W REFLEX MICROSCOPIC
Bacteria, UA: NONE SEEN
Bilirubin Urine: NEGATIVE
Glucose, UA: NEGATIVE mg/dL
Ketones, ur: NEGATIVE mg/dL
Leukocytes,Ua: NEGATIVE
Nitrite: NEGATIVE
Protein, ur: NEGATIVE mg/dL
Specific Gravity, Urine: 1.011 (ref 1.005–1.030)
pH: 7 (ref 5.0–8.0)

## 2024-05-12 LAB — COMPREHENSIVE METABOLIC PANEL WITH GFR
ALT: 26 U/L (ref 0–44)
AST: 20 U/L (ref 15–41)
Albumin: 4.4 g/dL (ref 3.5–5.0)
Alkaline Phosphatase: 72 U/L (ref 38–126)
Anion gap: 10 (ref 5–15)
BUN: 7 mg/dL — ABNORMAL LOW (ref 8–23)
CO2: 27 mmol/L (ref 22–32)
Calcium: 9.6 mg/dL (ref 8.9–10.3)
Chloride: 103 mmol/L (ref 98–111)
Creatinine, Ser: 0.73 mg/dL (ref 0.44–1.00)
GFR, Estimated: >60 mL/min (ref >60)
Glucose, Bld: 128 mg/dL — ABNORMAL HIGH (ref 70–99)
Potassium: 3.7 mmol/L (ref 3.5–5.1)
Sodium: 139 mmol/L (ref 135–145)
Total Bilirubin: 1.3 mg/dL — ABNORMAL HIGH (ref 0.0–1.2)
Total Protein: 7.9 g/dL (ref 6.5–8.1)

## 2024-05-12 LAB — CBC
HCT: 38.9 % (ref 36.0–46.0)
Hemoglobin: 12.3 g/dL (ref 12.0–15.0)
MCH: 26.7 pg (ref 26.0–34.0)
MCHC: 31.6 g/dL (ref 30.0–36.0)
MCV: 84.4 fL (ref 80.0–100.0)
Platelets: 290 K/uL (ref 150–400)
RBC: 4.61 MIL/uL (ref 3.87–5.11)
RDW: 12.8 % (ref 11.5–15.5)
WBC: 10.2 K/uL (ref 4.0–10.5)
nRBC: 0 % (ref 0.0–0.2)

## 2024-05-12 LAB — LIPASE, BLOOD: Lipase: 14 U/L (ref 11–51)

## 2024-05-12 MED ORDER — FENTANYL CITRATE (PF) 50 MCG/ML IJ SOSY
25.0000 ug | PREFILLED_SYRINGE | Freq: Once | INTRAMUSCULAR | Status: AC
  Administered 2024-05-12: 25 ug via INTRAVENOUS
  Filled 2024-05-12: qty 1

## 2024-05-12 MED ORDER — METRONIDAZOLE 500 MG PO TABS: 500.0000 mg | ORAL_TABLET | Freq: Two times a day (BID) | ORAL | 0 refills | Status: AC

## 2024-05-12 MED ORDER — CIPROFLOXACIN HCL 500 MG PO TABS: 500.0000 mg | ORAL_TABLET | Freq: Two times a day (BID) | ORAL | 0 refills | Status: AC

## 2024-05-12 MED ORDER — MORPHINE SULFATE (PF) 4 MG/ML IV SOLN
4.0000 mg | Freq: Once | INTRAVENOUS | Status: AC
  Administered 2024-05-12: 4 mg via INTRAVENOUS
  Filled 2024-05-12: qty 1

## 2024-05-12 MED ORDER — METRONIDAZOLE 500 MG/100ML IV SOLN
500.0000 mg | Freq: Once | INTRAVENOUS | Status: AC
  Administered 2024-05-12: 500 mg via INTRAVENOUS
  Filled 2024-05-12: qty 100

## 2024-05-12 NOTE — ED Provider Notes (Signed)
 [place] [facility]PARTMENT AT [facility] Provider Note   CSN: [id number] Arrival date & time: 05/12/24  1004     Patient presents with: Abdominal Pain   [name] is a 68 y.o. female.   The history is provided by the patient. No language interpreter was used.       Prior to Admission medications   Medication Sig Start Date End Date Taking? Authorizing Provider  ciprofloxacin  (CIPRO ) 500 MG tablet Take 1 tablet (500 mg total) by mouth every 12 (twelve) hours. 05/12/24  Yes Benard Minturn, Margit, PA-C  metroNIDAZOLE  (FLAGYL ) 500 MG tablet Take 1 tablet (500 mg total) by mouth 2 (two) times daily. 05/12/24  Yes Odell Margit, PA-C  ACCU-CHEK GUIDE test strip  02/25/20   [provider]  albuterol  (VENTOLIN  HFA) 108 (90 Base) MCG/ACT inhaler Inhale 2 puffs into the lungs every 4 (four) hours as needed for wheezing. 03/18/21   [provider]  ALPRAZolam  (XANAX ) 0.5 MG tablet Take 0.5 mg by mouth at bedtime as needed for sleep. 08/16/17   [provider]  aspirin  81 MG chewable tablet Chew 81 mg by mouth once a week.    [provider]  atorvastatin  (LIPITOR) 10 MG tablet Take 1 tablet by mouth daily. 08/24/21   [provider]  azelastine  (ASTELIN ) 0.1 % nasal spray Place 2 sprays into both nostrils 2 (two) times daily. Use in each nostril as directed Patient taking differently: Place 2 sprays into both nostrils 2 (two) times daily as needed for allergies. Use in each nostril as directed 10/18/17   Juliane Che, PA  Azilsartan Medoxomil (EDARBI) 40 MG TABS Take 40 mg by mouth daily.     [provider]  Blood Glucose Monitoring Suppl (ACCU-CHEK GUIDE) w/Device KIT USE DAILY AS DIRECTED 05/17/17   Juliane Che, PA  cetirizine  (ZYRTEC ) 10 MG tablet Take 1 tablet by mouth daily.    [provider]  Cholecalciferol 50 MCG (2000 UT) CAPS Take 1 tablet by mouth daily. 11/22/21   [provider]  CLENPIQ  10-3.5-12 MG-GM -GM/160ML SOLN  10/06/21   [provider]  clobetasol ointment (TEMOVATE) 0.05 % Apply 1 application  topically as needed. 05/13/20   [provider]  cyclobenzaprine  (FLEXERIL ) 10 MG tablet Take 10 mg by mouth 3 (three) times daily as needed for muscle spasms.     [provider]  Dextromethorphan HBr (SCOT-TUSSIN DIABETES) 10 MG/5ML LIQD Take 10 mg by mouth daily as needed (cough).    [provider]  diazepam  (VALIUM ) 5 MG tablet Take by mouth as needed. 11/26/15   [provider]  dicyclomine  (BENTYL ) 10 MG capsule Take 1 capsule 3 times a day by oral route.    [provider]  diphenoxylate-atropine (LOMOTIL) 2.5-0.025 MG tablet Take 2 tablets as needed by oral route.    [provider]  docusate sodium  (COLACE) 100 MG capsule TAKE 1 CAPSULE (100 MG TOTAL) BY MOUTH DAILY AS NEEDED FOR MILD CONSTIPATION. 10/23/14   Juliane Che, PA  doxycycline  (VIBRA -TABS) 100 MG tablet     [provider]  ezetimibe  (ZETIA ) 10 MG tablet Take 1 tablet (10 mg total) by mouth every evening. 10/18/17   Juliane Che, PA  FLOVENT  HFA 110 MCG/ACT inhaler Inhale 2 puffs into the lungs 2 (two) times daily as needed (shortness of breath). 08/29/17   [provider]  fluticasone  (FLONASE ) 50 MCG/ACT nasal spray Place 2 sprays into both nostrils daily. Patient taking differently:  Place 2 sprays into both nostrils as needed for allergies. 10/18/17   Juliane Che, PA  hydrALAZINE  (APRESOLINE ) 25 MG tablet Take 25 mg by mouth as needed. 12/13/18   [provider]  hydrALAZINE  (APRESOLINE ) 25 MG tablet Take 2 tablets (50 mg total) by mouth 2 (two) times daily as needed (If blood pressure remaining over 140/90). 02/25/24 04/17/24  Zelaya, Oscar A, PA-C  hydrocortisone  (ANUSOL -HC) 2.5 % rectal cream Place rectally. 01/01/19   [provider]  hydrocortisone  2.5 % cream Apply 1 application. topically daily as needed  (hemorrhoids). 01/01/19   [provider]  JANUVIA 50 MG tablet Take 50 mg by mouth daily.    [provider]  loperamide (IMODIUM A-D) 2 MG tablet Take 1-2 mg by mouth 4 (four) times daily as needed for diarrhea or loose stools.    [provider]  LOTEMAX SM 0.38 % GEL Place 1 drop into both eyes as needed (redness). 10/18/17   [provider]  meloxicam  (MOBIC ) 15 MG tablet TAKE 1 TABLET BY MOUTH EVERY DAY Patient taking differently: Take 15 mg by mouth daily as needed for pain. 02/24/16   Juliane Che, PA  metoprolol  tartrate (LOPRESSOR ) 25 MG tablet Take 12.5 mg by mouth daily. 01/01/22   [provider]  Misc. Devices MISC Blood pressure monitor Length of need:  99 months 06/22/20   [provider]  Multiple Vitamin (MULTI-VITAMIN DAILY PO) Take 1 tablet by mouth daily.    [provider]  naloxone [facility]) nasal spray 4 mg/0.1 mL Place 1 spray into the nose as needed (opioid overdose).    [provider]  nitroGLYCERIN  (NITROSTAT ) 0.3 MG SL tablet Place 0.3 mg under the tongue every 5 (five) minutes as needed.    [provider]  nitroGLYCERIN  (NITROSTAT ) 0.4 MG SL tablet     [provider]  olopatadine  (PATANOL) 0.1 % ophthalmic solution Place 1 drop into both eyes 2 (two) times daily. Patient taking differently: Place 1 drop into both eyes 2 (two) times daily as needed for allergies. 10/18/17   Juliane Che, PA  ondansetron  (ZOFRAN -ODT) 4 MG disintegrating tablet Take 1 tablet (4 mg total) by mouth every 8 (eight) hours as needed for nausea or vomiting. 07/10/21   Haviland, Julie, MD  oxyCODONE -acetaminophen  (PERCOCET) 10-325 MG tablet Take 1 tablet by mouth 3 (three) times daily as needed. 12/08/21   [provider]  Peppermint Oil (IBGARD PO) Take 1 Dose by mouth as needed (upset stomach).    [provider]  Probiotic Product (PROBIOTIC PO) Take 1 capsule by mouth daily as needed  (upset stomach).    [provider]  venlafaxine XR (EFFEXOR-XR) 150 MG 24 hr capsule Take 150 mg by mouth daily. 11/24/21   [provider]  zolpidem  (AMBIEN ) 10 MG tablet Take 10 mg by mouth at bedtime as needed for sleep.  09/28/13   [provider]    Allergies: Codeine, Propoxyphene n-acetaminophen , Sulfonamide derivatives, Alka-seltzer [aspirin  effervescent], Amlodipine , Amoxicillin -pot clavulanate, Norvasc  [amlodipine  besylate], Red dye #40 (allura red), Singulair  [montelukast  sodium], Sulfa  antibiotics, Sulfur, Bupropion, Buspirone , Latex, and Metformin and related    Review of Systems  Updated Vital Signs BP (!) 149/89   Pulse (!) 59   Temp 98.2 F (36.8 C)   Resp 18   SpO2 99%   Physical Exam Vitals and nursing note reviewed.  Constitutional:      Appearance: She is well-developed. She is obese.  HENT:  Head: Normocephalic.  Cardiovascular:     Rate and Rhythm: Normal rate and regular rhythm.  Pulmonary:     Effort: Pulmonary effort is normal.     Breath sounds: Normal breath sounds. No wheezing, rhonchi or rales.  Abdominal:     General: Bowel sounds are normal. There is no distension.     Palpations: Abdomen is soft.     Tenderness: There is abdominal tenderness in the left lower quadrant. There is no guarding or rebound.  Musculoskeletal:        General: Normal range of motion.     Cervical back: Normal range of motion and neck supple.  Skin:    General: Skin is warm and dry.  Neurological:     General: No focal deficit present.     Mental Status: She is alert and oriented to person, place, and time.     (all labs ordered are listed, but only abnormal results are displayed) Labs Reviewed  COMPREHENSIVE METABOLIC PANEL WITH GFR - Abnormal; Notable for the following components:      Result Value   Glucose, Bld 128 (*)    BUN 7 (*)    Total Bilirubin 1.3 (*)    All other components within normal limits  URINALYSIS, ROUTINE W  REFLEX MICROSCOPIC - Abnormal; Notable for the following components:   Hgb urine dipstick TRACE (*)    All other components within normal limits  LIPASE, BLOOD  CBC   Results for orders placed or performed during the hospital encounter of 05/12/24  Lipase, blood   Collection Time: 05/12/24 11:06 AM  Result Value Ref Range   Lipase 14 11 - 51 U/L  Comprehensive metabolic panel   Collection Time: 05/12/24 11:06 AM  Result Value Ref Range   Sodium 139 135 - 145 mmol/L   Potassium 3.7 3.5 - 5.1 mmol/L   Chloride 103 98 - 111 mmol/L   CO2 27 22 - 32 mmol/L   Glucose, Bld 128 (H) 70 - 99 mg/dL   BUN 7 (L) 8 - 23 mg/dL   Creatinine, Ser 9.26 0.44 - 1.00 mg/dL   Calcium  9.6 8.9 - 10.3 mg/dL   Total Protein 7.9 6.5 - 8.1 g/dL   Albumin 4.4 3.5 - 5.0 g/dL   AST 20 15 - 41 U/L   ALT 26 0 - 44 U/L   Alkaline Phosphatase 72 38 - 126 U/L   Total Bilirubin 1.3 (H) 0.0 - 1.2 mg/dL   GFR, Estimated >39 >39 mL/min   Anion gap 10 5 - 15  CBC   Collection Time: 05/12/24 11:06 AM  Result Value Ref Range   WBC 10.2 4.0 - 10.5 K/uL   RBC 4.61 3.87 - 5.11 MIL/uL   Hemoglobin 12.3 12.0 - 15.0 g/dL   HCT 61.0 63.9 - 53.9 %   MCV 84.4 80.0 - 100.0 fL   MCH 26.7 26.0 - 34.0 pg   MCHC 31.6 30.0 - 36.0 g/dL   RDW 87.1 88.4 - 84.4 %   Platelets 290 150 - 400 K/uL   nRBC 0.0 0.0 - 0.2 %  Urinalysis, Routine w reflex microscopic -Urine, Clean Catch   Collection Time: 05/12/24 11:06 AM  Result Value Ref Range   Color, Urine YELLOW YELLOW   APPearance CLEAR CLEAR   Specific Gravity, Urine 1.011 1.005 - 1.030   pH 7.0 5.0 - 8.0   Glucose, UA NEGATIVE NEGATIVE mg/dL   Hgb urine dipstick TRACE (A) NEGATIVE   Bilirubin Urine NEGATIVE NEGATIVE  Ketones, ur NEGATIVE NEGATIVE mg/dL   Protein, ur NEGATIVE NEGATIVE mg/dL   Nitrite NEGATIVE NEGATIVE   Leukocytes,Ua NEGATIVE NEGATIVE   RBC / HPF 6-10 0 - 5 RBC/hpf   WBC, UA 0-5 0 - 5 WBC/hpf   Bacteria, UA NONE SEEN NONE SEEN   Squamous Epithelial /  HPF 0-5 0 - 5 /HPF   Mucus PRESENT     EKG: None  Radiology: No results found.   Procedures   Medications Ordered in the ED  fentaNYL  (SUBLIMAZE ) injection 25 mcg (25 mcg Intravenous Given 05/12/24 1147)  morphine  (PF) 4 MG/ML injection 4 mg (4 mg Intravenous Given 05/12/24 1230)  metroNIDAZOLE  (FLAGYL ) IVPB 500 mg (0 mg Intravenous Stopped 05/12/24 1329)    Clinical Course as of 05/12/24 1400  Sun May 12, 2024  1222 Patient to ED with LLQ abdominal pain c/w previous episodes of diverticulitis, per patient. No fever. +N, V.  [SU]  1353 On recheck, she appears to be more comfortable after pain and nausea medication. Labs reviewed, no WBC count, normal electrolytes, normal renal function, urine without infection. Do not feel CT imaging is necessary with history. Cipro  and Flagyl  to be prescribed (patient preference). She will plan to see her GI doctor Claudio) if no better in 3 days. Return precautions discussed.  [SU]    Clinical Course User Index [SU] Odell Balls, PA-C                                 Medical Decision Making Amount and/or Complexity of Data Reviewed Labs: ordered.  Risk Prescription drug management.        Final diagnoses:  LLQ abdominal pain  History of diverticulitis    ED Discharge Orders          Ordered    metroNIDAZOLE  (FLAGYL ) 500 MG tablet  2 times daily        05/12/24 1357    ciprofloxacin  (CIPRO ) 500 MG tablet  Every 12 hours        05/12/24 1357               Odell Balls, PA-C 05/12/24 1400    Bernard Drivers, MD 05/13/24 1226

## 2024-05-12 NOTE — Discharge Instructions (Signed)
 As we discussed, take the antibiotics as prescribed. Continue your medications at home for nausea and pain. If no better in 3-4 days, plan to see your gastroenterologist for further evaluation.   If you develop a fever, have uncontrolled vomiting or severe abdominal pain, return to the ED for further management.

## 2024-05-12 NOTE — ED Triage Notes (Signed)
 She c/o llq abd. Pain x 2 days. She recognizes it as possibly being my diverticulitis again.

## 2024-06-04 ENCOUNTER — Ambulatory Visit: Payer: Medicare Other

## 2024-06-04 DIAGNOSIS — I495 Sick sinus syndrome: Secondary | ICD-10-CM

## 2024-06-05 LAB — CUP PACEART REMOTE DEVICE CHECK
Battery Remaining Longevity: 8 mo
Battery Voltage: 2.87 V
Brady Statistic AP VP Percent: 0.05 %
Brady Statistic AP VS Percent: 99.68 %
Brady Statistic AS VP Percent: 0 %
Brady Statistic AS VS Percent: 0.28 %
Brady Statistic RA Percent Paced: 99.72 %
Brady Statistic RV Percent Paced: 0.05 %
Date Time Interrogation Session: 20251231113301
Implantable Lead Connection Status: 753985
Implantable Lead Connection Status: 753985
Implantable Lead Implant Date: 20160202
Implantable Lead Implant Date: 20160202
Implantable Lead Location: 753859
Implantable Lead Location: 753860
Implantable Lead Model: 5076
Implantable Lead Model: 5076
Implantable Pulse Generator Implant Date: 20160202
Lead Channel Impedance Value: 399 Ohm
Lead Channel Impedance Value: 418 Ohm
Lead Channel Impedance Value: 456 Ohm
Lead Channel Impedance Value: 513 Ohm
Lead Channel Pacing Threshold Amplitude: 0.5 V
Lead Channel Pacing Threshold Amplitude: 1 V
Lead Channel Pacing Threshold Pulse Width: 0.4 ms
Lead Channel Pacing Threshold Pulse Width: 0.4 ms
Lead Channel Sensing Intrinsic Amplitude: 12.375 mV
Lead Channel Sensing Intrinsic Amplitude: 12.375 mV
Lead Channel Sensing Intrinsic Amplitude: 4.5 mV
Lead Channel Sensing Intrinsic Amplitude: 4.5 mV
Lead Channel Setting Pacing Amplitude: 2 V
Lead Channel Setting Pacing Amplitude: 2.5 V
Lead Channel Setting Pacing Pulse Width: 0.4 ms
Lead Channel Setting Sensing Sensitivity: 2.8 mV
Zone Setting Status: 755011
Zone Setting Status: 755011

## 2024-06-07 NOTE — Progress Notes (Signed)
 Remote PPM Transmission

## 2024-06-08 ENCOUNTER — Ambulatory Visit: Payer: Self-pay | Admitting: Cardiology

## 2024-07-30 ENCOUNTER — Ambulatory Visit: Admitting: Podiatry

## 2024-11-05 ENCOUNTER — Ambulatory Visit: Admitting: Podiatry
